# Patient Record
Sex: Female | Born: 1940
Health system: Southern US, Community
[De-identification: ages and names within clinical notes are randomized; demographics above are authoritative.]

## PROBLEM LIST (undated history)

## (undated) DIAGNOSIS — N183 Chronic kidney disease, stage 3 (moderate): Secondary | ICD-10-CM

## (undated) DIAGNOSIS — E119 Type 2 diabetes mellitus without complications: Secondary | ICD-10-CM

## (undated) DIAGNOSIS — C50919 Malignant neoplasm of unspecified site of unspecified female breast: Secondary | ICD-10-CM

## (undated) DIAGNOSIS — C801 Malignant (primary) neoplasm, unspecified: Secondary | ICD-10-CM

## (undated) DIAGNOSIS — R011 Cardiac murmur, unspecified: Secondary | ICD-10-CM

## (undated) DIAGNOSIS — Z973 Presence of spectacles and contact lenses: Secondary | ICD-10-CM

## (undated) DIAGNOSIS — R0600 Dyspnea, unspecified: Secondary | ICD-10-CM

## (undated) DIAGNOSIS — I4891 Unspecified atrial fibrillation: Secondary | ICD-10-CM

## (undated) DIAGNOSIS — E785 Hyperlipidemia, unspecified: Secondary | ICD-10-CM

## (undated) DIAGNOSIS — K219 Gastro-esophageal reflux disease without esophagitis: Secondary | ICD-10-CM

## (undated) DIAGNOSIS — I10 Essential (primary) hypertension: Secondary | ICD-10-CM

## (undated) HISTORY — DX: Essential (primary) hypertension: I10

## (undated) HISTORY — DX: Hyperlipidemia, unspecified: E78.5

## (undated) HISTORY — PX: UPPER GI ENDOSCOPY: SHX6162

## (undated) HISTORY — DX: Type 2 diabetes mellitus without complications: E11.9

## (undated) HISTORY — DX: Gastro-esophageal reflux disease without esophagitis: K21.9

## (undated) HISTORY — PX: ABDOMINAL HYSTERECTOMY: SHX81

## (undated) HISTORY — DX: Malignant (primary) neoplasm, unspecified: C80.1

## (undated) HISTORY — PX: COLONOSCOPY: SHX174

## (undated) HISTORY — DX: Malignant neoplasm of unspecified site of unspecified female breast: C50.919

## (undated) HISTORY — DX: Chronic kidney disease, stage 3 (moderate): N18.3

---

## 1994-06-08 ENCOUNTER — Encounter (INDEPENDENT_AMBULATORY_CARE_PROVIDER_SITE_OTHER): Payer: Self-pay | Admitting: *Deleted

## 1994-06-08 LAB — CONVERTED CEMR LAB

## 1997-09-05 ENCOUNTER — Encounter: Admission: RE | Admit: 1997-09-05 | Discharge: 1997-09-05 | Payer: Self-pay | Admitting: Family Medicine

## 1997-12-17 ENCOUNTER — Encounter: Admission: RE | Admit: 1997-12-17 | Discharge: 1997-12-17 | Payer: Self-pay | Admitting: Family Medicine

## 1998-10-09 ENCOUNTER — Encounter: Admission: RE | Admit: 1998-10-09 | Discharge: 1998-10-09 | Payer: Self-pay | Admitting: Family Medicine

## 1998-10-23 ENCOUNTER — Encounter: Admission: RE | Admit: 1998-10-23 | Discharge: 1998-10-23 | Payer: Self-pay | Admitting: Family Medicine

## 1998-10-23 ENCOUNTER — Ambulatory Visit (HOSPITAL_COMMUNITY): Admission: RE | Admit: 1998-10-23 | Discharge: 1998-10-23 | Payer: Self-pay | Admitting: Family Medicine

## 1998-11-23 ENCOUNTER — Encounter: Admission: RE | Admit: 1998-11-23 | Discharge: 1999-02-21 | Payer: Self-pay

## 1998-12-01 ENCOUNTER — Encounter: Admission: RE | Admit: 1998-12-01 | Discharge: 1998-12-01 | Payer: Self-pay | Admitting: Sports Medicine

## 1998-12-03 ENCOUNTER — Encounter: Admission: RE | Admit: 1998-12-03 | Discharge: 1998-12-03 | Payer: Self-pay | Admitting: Sports Medicine

## 1998-12-03 ENCOUNTER — Encounter: Payer: Self-pay | Admitting: Sports Medicine

## 1999-07-13 ENCOUNTER — Encounter: Admission: RE | Admit: 1999-07-13 | Discharge: 1999-07-13 | Payer: Self-pay | Admitting: Family Medicine

## 1999-07-19 ENCOUNTER — Encounter: Admission: RE | Admit: 1999-07-19 | Discharge: 1999-07-19 | Payer: Self-pay | Admitting: Family Medicine

## 2000-02-08 HISTORY — PX: BACK SURGERY: SHX140

## 2000-02-14 ENCOUNTER — Encounter: Admission: RE | Admit: 2000-02-14 | Discharge: 2000-02-14 | Payer: Self-pay | Admitting: Family Medicine

## 2000-07-26 ENCOUNTER — Encounter: Admission: RE | Admit: 2000-07-26 | Discharge: 2000-07-26 | Payer: Self-pay | Admitting: Family Medicine

## 2000-08-01 ENCOUNTER — Encounter: Admission: RE | Admit: 2000-08-01 | Discharge: 2000-08-01 | Payer: Self-pay | Admitting: Family Medicine

## 2000-08-03 ENCOUNTER — Encounter: Admission: RE | Admit: 2000-08-03 | Discharge: 2000-08-03 | Payer: Self-pay | Admitting: Family Medicine

## 2000-08-04 ENCOUNTER — Encounter: Admission: RE | Admit: 2000-08-04 | Discharge: 2000-08-04 | Payer: Self-pay | Admitting: *Deleted

## 2000-08-08 ENCOUNTER — Encounter: Admission: RE | Admit: 2000-08-08 | Discharge: 2000-08-08 | Payer: Self-pay | Admitting: *Deleted

## 2000-08-08 ENCOUNTER — Encounter: Payer: Self-pay | Admitting: *Deleted

## 2000-08-14 ENCOUNTER — Encounter: Admission: RE | Admit: 2000-08-14 | Discharge: 2000-08-14 | Payer: Self-pay | Admitting: Family Medicine

## 2000-09-12 ENCOUNTER — Encounter: Payer: Self-pay | Admitting: Orthopedic Surgery

## 2000-09-12 ENCOUNTER — Encounter: Admission: RE | Admit: 2000-09-12 | Discharge: 2000-09-12 | Payer: Self-pay | Admitting: Family Medicine

## 2000-09-15 ENCOUNTER — Encounter: Admission: RE | Admit: 2000-09-15 | Discharge: 2000-09-15 | Payer: Self-pay | Admitting: Family Medicine

## 2000-09-15 ENCOUNTER — Encounter: Payer: Self-pay | Admitting: Orthopedic Surgery

## 2000-09-15 ENCOUNTER — Encounter (INDEPENDENT_AMBULATORY_CARE_PROVIDER_SITE_OTHER): Payer: Self-pay | Admitting: *Deleted

## 2000-09-16 ENCOUNTER — Inpatient Hospital Stay (HOSPITAL_COMMUNITY): Admission: RE | Admit: 2000-09-16 | Discharge: 2000-09-17 | Payer: Self-pay | Admitting: Orthopedic Surgery

## 2000-09-16 ENCOUNTER — Encounter: Payer: Self-pay | Admitting: Orthopedic Surgery

## 2000-10-11 ENCOUNTER — Encounter: Admission: RE | Admit: 2000-10-11 | Discharge: 2000-10-11 | Payer: Self-pay | Admitting: Family Medicine

## 2000-10-18 ENCOUNTER — Encounter: Admission: RE | Admit: 2000-10-18 | Discharge: 2000-10-18 | Payer: Self-pay | Admitting: Family Medicine

## 2000-10-25 ENCOUNTER — Encounter: Admission: RE | Admit: 2000-10-25 | Discharge: 2000-10-25 | Payer: Self-pay | Admitting: Family Medicine

## 2000-11-08 ENCOUNTER — Encounter: Admission: RE | Admit: 2000-11-08 | Discharge: 2000-11-08 | Payer: Self-pay | Admitting: Family Medicine

## 2001-02-07 HISTORY — PX: CARDIAC CATHETERIZATION: SHX172

## 2001-02-20 ENCOUNTER — Encounter: Admission: RE | Admit: 2001-02-20 | Discharge: 2001-02-20 | Payer: Self-pay | Admitting: Family Medicine

## 2001-03-22 ENCOUNTER — Encounter: Admission: RE | Admit: 2001-03-22 | Discharge: 2001-03-22 | Payer: Self-pay | Admitting: Family Medicine

## 2001-04-03 ENCOUNTER — Emergency Department (HOSPITAL_COMMUNITY): Admission: EM | Admit: 2001-04-03 | Discharge: 2001-04-03 | Payer: Self-pay | Admitting: Emergency Medicine

## 2001-04-03 ENCOUNTER — Encounter: Payer: Self-pay | Admitting: Emergency Medicine

## 2001-04-24 ENCOUNTER — Encounter: Admission: RE | Admit: 2001-04-24 | Discharge: 2001-04-24 | Payer: Self-pay | Admitting: Family Medicine

## 2001-08-16 ENCOUNTER — Ambulatory Visit (HOSPITAL_COMMUNITY): Admission: RE | Admit: 2001-08-16 | Discharge: 2001-08-17 | Payer: Self-pay | Admitting: Cardiovascular Disease

## 2001-09-18 ENCOUNTER — Encounter: Admission: RE | Admit: 2001-09-18 | Discharge: 2001-09-18 | Payer: Self-pay | Admitting: Gastroenterology

## 2001-09-18 ENCOUNTER — Encounter: Payer: Self-pay | Admitting: Gastroenterology

## 2001-10-24 ENCOUNTER — Ambulatory Visit (HOSPITAL_COMMUNITY): Admission: RE | Admit: 2001-10-24 | Discharge: 2001-10-24 | Payer: Self-pay | Admitting: Gastroenterology

## 2001-10-24 ENCOUNTER — Encounter (INDEPENDENT_AMBULATORY_CARE_PROVIDER_SITE_OTHER): Payer: Self-pay | Admitting: *Deleted

## 2001-11-13 ENCOUNTER — Ambulatory Visit (HOSPITAL_COMMUNITY): Admission: RE | Admit: 2001-11-13 | Discharge: 2001-11-13 | Payer: Self-pay | Admitting: Gastroenterology

## 2001-11-13 ENCOUNTER — Encounter: Payer: Self-pay | Admitting: Gastroenterology

## 2002-05-10 ENCOUNTER — Encounter: Admission: RE | Admit: 2002-05-10 | Discharge: 2002-05-10 | Payer: Self-pay | Admitting: Internal Medicine

## 2002-05-10 ENCOUNTER — Encounter: Payer: Self-pay | Admitting: Internal Medicine

## 2003-05-27 ENCOUNTER — Encounter: Admission: RE | Admit: 2003-05-27 | Discharge: 2003-05-27 | Payer: Self-pay | Admitting: Internal Medicine

## 2003-10-24 ENCOUNTER — Emergency Department (HOSPITAL_COMMUNITY): Admission: EM | Admit: 2003-10-24 | Discharge: 2003-10-24 | Payer: Self-pay | Admitting: Emergency Medicine

## 2004-04-07 ENCOUNTER — Encounter: Admission: RE | Admit: 2004-04-07 | Discharge: 2004-04-07 | Payer: Self-pay | Admitting: Gastroenterology

## 2004-06-23 ENCOUNTER — Ambulatory Visit (HOSPITAL_COMMUNITY): Admission: RE | Admit: 2004-06-23 | Discharge: 2004-06-23 | Payer: Self-pay | Admitting: Gastroenterology

## 2004-06-23 ENCOUNTER — Encounter (INDEPENDENT_AMBULATORY_CARE_PROVIDER_SITE_OTHER): Payer: Self-pay | Admitting: Specialist

## 2004-06-25 ENCOUNTER — Inpatient Hospital Stay (HOSPITAL_COMMUNITY): Admission: AD | Admit: 2004-06-25 | Discharge: 2004-06-27 | Payer: Self-pay | Admitting: Cardiology

## 2005-02-12 ENCOUNTER — Emergency Department (HOSPITAL_COMMUNITY): Admission: EM | Admit: 2005-02-12 | Discharge: 2005-02-12 | Payer: Self-pay

## 2005-02-21 ENCOUNTER — Emergency Department (HOSPITAL_COMMUNITY): Admission: EM | Admit: 2005-02-21 | Discharge: 2005-02-21 | Payer: Self-pay | Admitting: Family Medicine

## 2005-04-07 ENCOUNTER — Encounter: Admission: RE | Admit: 2005-04-07 | Discharge: 2005-04-07 | Payer: Self-pay

## 2005-04-28 ENCOUNTER — Emergency Department (HOSPITAL_COMMUNITY): Admission: EM | Admit: 2005-04-28 | Discharge: 2005-04-28 | Payer: Self-pay | Admitting: Emergency Medicine

## 2005-05-04 ENCOUNTER — Emergency Department (HOSPITAL_COMMUNITY): Admission: EM | Admit: 2005-05-04 | Discharge: 2005-05-05 | Payer: Self-pay | Admitting: Emergency Medicine

## 2005-06-01 ENCOUNTER — Emergency Department (HOSPITAL_COMMUNITY): Admission: EM | Admit: 2005-06-01 | Discharge: 2005-06-01 | Payer: Self-pay | Admitting: Emergency Medicine

## 2006-04-07 ENCOUNTER — Encounter (INDEPENDENT_AMBULATORY_CARE_PROVIDER_SITE_OTHER): Payer: Self-pay | Admitting: *Deleted

## 2006-11-17 ENCOUNTER — Emergency Department (HOSPITAL_COMMUNITY): Admission: EM | Admit: 2006-11-17 | Discharge: 2006-11-17 | Payer: Self-pay | Admitting: Family Medicine

## 2008-05-21 ENCOUNTER — Emergency Department (HOSPITAL_COMMUNITY): Admission: EM | Admit: 2008-05-21 | Discharge: 2008-05-21 | Payer: Self-pay | Admitting: Emergency Medicine

## 2009-04-24 DIAGNOSIS — E785 Hyperlipidemia, unspecified: Secondary | ICD-10-CM

## 2009-04-24 DIAGNOSIS — I10 Essential (primary) hypertension: Secondary | ICD-10-CM

## 2009-12-25 ENCOUNTER — Emergency Department (HOSPITAL_COMMUNITY): Admission: EM | Admit: 2009-12-25 | Discharge: 2009-12-25 | Payer: Self-pay | Admitting: Emergency Medicine

## 2010-02-09 ENCOUNTER — Emergency Department (HOSPITAL_COMMUNITY)
Admission: EM | Admit: 2010-02-09 | Discharge: 2010-02-09 | Payer: Self-pay | Source: Home / Self Care | Admitting: Emergency Medicine

## 2010-04-30 ENCOUNTER — Other Ambulatory Visit: Payer: Self-pay | Admitting: Internal Medicine

## 2010-04-30 DIAGNOSIS — Z1231 Encounter for screening mammogram for malignant neoplasm of breast: Secondary | ICD-10-CM

## 2010-05-07 ENCOUNTER — Ambulatory Visit
Admission: RE | Admit: 2010-05-07 | Discharge: 2010-05-07 | Disposition: A | Discharge status: Home / Self Care | Source: Ambulatory Visit | Attending: Internal Medicine | Admitting: Internal Medicine

## 2010-05-07 DIAGNOSIS — Z1231 Encounter for screening mammogram for malignant neoplasm of breast: Secondary | ICD-10-CM

## 2010-05-19 LAB — POCT I-STAT, CHEM 8
BUN: 20 mg/dL (ref 6–23)
Calcium, Ion: 1.19 mmol/L (ref 1.12–1.32)
HCT: 34 % — ABNORMAL LOW (ref 36.0–46.0)
Hemoglobin: 11.6 g/dL — ABNORMAL LOW (ref 12.0–15.0)
Sodium: 139 mEq/L (ref 135–145)
TCO2: 28 mmol/L (ref 0–100)

## 2010-05-19 LAB — POCT CARDIAC MARKERS
Myoglobin, poc: 73.1 ng/mL (ref 12–200)
Troponin i, poc: <0.05 ng/mL (ref 0.00–0.09)

## 2010-05-19 LAB — URINALYSIS, ROUTINE W REFLEX MICROSCOPIC
Glucose, UA: NEGATIVE mg/dL
Hgb urine dipstick: NEGATIVE
Specific Gravity, Urine: 1.01 (ref 1.005–1.030)
pH: 7 (ref 5.0–8.0)

## 2010-05-19 LAB — PROTIME-INR
INR: 1.1 (ref 0.00–1.49)
Prothrombin Time: 14.2 seconds (ref 11.6–15.2)

## 2010-05-19 LAB — URINE MICROSCOPIC-ADD ON

## 2010-06-25 NOTE — Op Note (Signed)
NAME:  Carrie Key, Carrie Key NO.:  [id number]   MEDICAL RECORD NO.:  [id number]          PATIENT TYPE:  AMB   LOCATION:  ENDO                         FACILITY:  [facility]   PHYSICIAN:  Anselmo Rod, M.D.  DATE OF BIRTH:  10/05/1940   DATE OF PROCEDURE:  06/23/2004  DATE OF DISCHARGE:                                 OPERATIVE REPORT   PROCEDURE PERFORMED:  Colonoscopy with cold biopsies times two.   ENDOSCOPIST:  Charna Elizabeth, M.D.   INSTRUMENT USED:  Olympus video colonoscope.   INDICATIONS FOR PROCEDURE:  A 70 year old African-American female with a  history of tubulovillous adenomas removed in the past.  Undergoing a repeat  colonoscopy to rule out polyps, masses, etc.   PREPROCEDURE PREPARATION:  Informed consent was procured from the patient.  The patient was fasted for eight hours prior to the procedure and prepped  with a bottle of magnesium citrate and a gallon of GoLYTELY the night prior  to the procedure.  The risks and benefits of the procedure including a 10%  miss rate for colon polyps or cancers was discussed with the patient as  well.   PREPROCEDURE PHYSICAL:  The patient had stable vital signs except for  significantly elevated blood pressure of 240/190, 250/180 on several repeat  recordings.  The patient was alert and oriented times three, in no acute  distress.  Neck supple.  Chest clear to auscultation.  S1 and S2 regular.  Abdomen soft with normal bowel sounds.   DESCRIPTION OF PROCEDURE:  The patient was placed in left lateral decubitus  position and sedated with 25 mg of Demerol in addition to the medications  she received for the esophagogastroduodenoscopy.  Once the patient was  adequately sedated and maintained on low flow oxygen and continuous cardiac  monitoring, the Olympus video colonoscope was advanced from the rectum to  the cecum.  The ileocecal valve was clearly visualized.  Two small sessile  polyps were biopsied from the cecum.  The  terminal ileum was not visualized.  There was some residual stool in the colon.  Multiple washes were done.  Retroflexion in the rectum revealed no abnormalities.  The patient tolerated  the procedure well without complication.   IMPRESSION:  Two small sessile polyps biopsied from proximal right colon.   RECOMMENDATIONS:  1. Await pathology results.  2. Avoid all nonsteroidals.  3. Outpatient followup in the next two weeks for further recommendations.        JNM/MEDQ  D:  06/23/2004  T:  06/23/2004  Job:  [id number]   cc:   Merlene Laughter. Renae Gloss, M.D.  [address]  [address]  [place]  [place] [address]  Fax: [phone]

## 2010-06-25 NOTE — Op Note (Signed)
NAME:  Carrie Key, Carrie Key NO.:  [id number]   MEDICAL RECORD NO.:  [id number]                   PATIENT TYPE:  AMB   LOCATION:  ENDO                                 FACILITY:  [facility]   PHYSICIAN:  Charna Elizabeth, M.D.                   DATE OF BIRTH:  07-May-1940   DATE OF PROCEDURE:  10/24/2001  DATE OF DISCHARGE:                                 OPERATIVE REPORT   PROCEDURE:  Esophagogastroduodenoscopy with biopsies.   ENDOSCOPIST:  Charna Elizabeth, M.D.   INSTRUMENT USED:  Olympus video panendoscope.   INDICATION FOR PROCEDURE:  A 70 year old African-American female with a  history of dysphagia, worse for liquids.  The patient's barium swallow did  not show a frank stricture.  The patient also has guaiac-positive stools.  Rule out esophagitis, peptic ulcer disease, etc.   PREPROCEDURE PREPARATION:  Informed consent was procured from the patient.  The patient was fasted for eight hours prior to the procedure.   PREPROCEDURE PHYSICAL:  VITAL SIGNS:  The patient had stable vital signs.  NECK:  Supple.  CHEST:  Clear to auscultation.  S1, S2 regular.  ABDOMEN:  Soft with normal bowel sounds.   DESCRIPTION OF PROCEDURE:  The patient was placed in the left lateral  decubitus position and sedated with 70 mg of Demerol and 7 mg of Versed  intravenously.  Once the patient was adequately sedate and maintained on low-  flow oxygen and continuous cardiac monitoring, the Olympus video  panendoscope was advanced through the mouthpiece, over the tongue, into the  esophagus under direct vision.  The proximal esophagus appeared normal.  There was a wide-open Schatzki's ring at the Z-line.  There was evidence of  Barrett's mucosa at the Z-line with patches of pinkish mucosa extending over  the gastroesophageal junction and the Schatzki's ring.  This was biopsied to  rule out dysphagia.  A small hiatal hernia was seen on high retroflexion.  The rest of the gastric mucosa  and proximal small bowel appeared normal.   IMPRESSION:  1. Barrett's-like changes in the distal esophagus with wide-open Schatzki's     ring, biopsies done, results pending.  2. Small hiatal hernia.  3. Normal-appearing stomach and proximal small bowel.   RECOMMENDATIONS:  1. Await pathology results.  2.     Proceed with a colonoscopy at this time.  3. Esophageal manometry to further evaluate the patient's dysphagia for     liquids.  4. Further recommendations to be made after colonoscopy.                                               Charna Elizabeth, M.D.    JM/MEDQ  D:  10/24/2001  T:  10/26/2001  Job:  [id number]   cc:  Merlene Laughter. Renae Gloss, M.D.

## 2010-06-25 NOTE — Discharge Summary (Signed)
[place]  Patient:    Carrie Key, Carrie Key Visit Number: [id number] MRN: [id number]          Service Type: CAT Location: [id number] Attending Physician:  Berry, Jonathan [place] Dictated by:   Raymon Mutton, P.A. Admit Date:  08/16/2001 Discharge Date: 08/17/2001   CC:         Lenise Herald, M.D.   Discharge Summary  DISCHARGE DIAGNOSES: 1. Chest pain admitted for elective coronary angiography. 2. Hypertension. 3. Hyperlipidemia.  HISTORY OF PRESENT ILLNESS:  The patient is a 70 year old African-American woman presenting to the office on August 09, 2001 with complaint of worrisome chest pain and shortness of breath.  Knowing her family history of coronary artery disease, the exact are such as hypertension and hyperlipidemia, she was scheduled for elective coronary angiography on August 16, 2001.  HOSPITAL COURSE:  The patient underwent coronary angiography on August 16, 2001 by Dr. Allyson Sabal.  The procedure revealed normal left ventricular function and normal coronaries as well as normal abdominal aorta and renal arteries.  Her chest pain was thought to be related to gastroesophageal reflux disease.  The patient was started on Nexium as outpatient and recommended to continue that.  She was stable after cath.  Her groin site was without any bleeding or oozing and swelling but she developed a blood pressure rise that was quite significant.  Her systolic reached 130 and diastolic 107 so the patient was transferred to the telemetry unit to stay overnight for better blood pressure control.  She was started on pindolol 10 mg t.i.d.  Next morning her blood pressure improved and she was stable from cardiovascular standpoint free of chest pain or shortness of breath and discharged home in good condition.  DISCHARGE MEDICATIONS: 1. Pindolol 10 mg p.o. t.i.d. 2. Diovan 160 mg p.o. daily. 3. Nexium 40 mg p.o. daily. 4. Norvasc 10 mg p.o. daily. 5.  Lotensin 40 mg p.o. daily. 6. Clonidine 0.3 mg b.i.d. p.o. 7. Zocor 40 mg p.o. daily. 8. Aspirin 81 mg daily.  DISCHARGE ACTIVITY:  No driving.  No heavy lifting.  No strenuous physical activity including sexual activity for 72 hours after the cath.  DISCHARGE DIET:  Low salt, low fat, cardiac diet.  WOUND CARE:  She was instructed to report any pain, swelling, redness, bleeding from the groin puncture site to our office.  FOLLOWUP:  Appointment scheduled with Dr. Jenne Campus on August 28, 2001 at 3:45 p.m. Dictated by:   Raymon Mutton, P.A. Attending Physician:  Berry, Jonathan [place] DD:  08/17/01 TD:  08/20/01 Job: [id number]

## 2010-06-25 NOTE — Discharge Summary (Signed)
Carrie Key, Carrie Key NO.:  [id number]   MEDICAL RECORD NO.:  [id number]          PATIENT TYPE:  INP   LOCATION:  3708                         FACILITY:  [facility]   PHYSICIAN:  Raymon Mutton, P.A. DATE OF BIRTH:  October 19, 1940   DATE OF ADMISSION:  06/25/2004  DATE OF DISCHARGE:  06/27/2004                                 DISCHARGE SUMMARY   DISCHARGE DIAGNOSES:  1.  Hypertensive crisis - resolved with compliance of medications.  2.  Gastroesophageal reflux disease.  3.  Hyperlipidemia.  4.  Chest pain - resolved, status post catheterization in 2003 with normal      coronaries.  5.  Status post recent colonoscopy with resection of polyps.   HISTORY OF PRESENT ILLNESS AND HOSPITAL COURSE:  This is a 70 year old  African-American female who presented to the office with elevated blood  pressure.  In the office, her blood pressure was 240/120, and second reading  250/120.  She was given Sular, Toprol-XL 50 mg, but her blood pressure was  not responding well.  In an hour and a half, it was rechecked but still  remained elevated, and the patient was transported to [facility]  for admission.  Of note, she had a colonoscopy on Jun 23, 2004, and we were  called to see the patient in Endo when her blood pressure also was elevated.  At that time, the patient was given Caduet and clonidine and nitroglycerin  sublingual, and in a while her blood pressure reduced to 154/94.   At this time, the patient is being admitted to [facility] for blood  pressure management and complaints of blurry vision, chest tightness, and  pain in the left arm.  The patient said that she does not remember her  medications and apparently was not taking her clonidine for a while.   Her laboratories revealed a normal cardiac panel x3.  TSH was normal at  0.461.  Magnesium was also normal at 1.7.  BMP showed 140 sodium, potassium  3.3, and it was replenished.  BUN was 10, creatinine 0.9.  A  recheck of BMP  after the patient was given potassium showed a serum potassium level of 4.6.  Lipid profile revealed cholesterol 318, triglycerides 19, HDL 49, and LDL  265.   CBC revealed hemoglobin 13.6, hematocrit 40.1, platelet count 190, and white  blood cell count 7.4.   We adjusted her medications.  The next morning, her blood pressure was  improved.  It showed 151/91; and heart rate was 75.  Her chest pain  resolved, and her overall condition improved.  Vision cleared.   The day of discharge, Dr. Clarene Duke saw the patient.  She was very symptomatic  and had swallowing difficulties, and she is supposed to follow up for that  with Dr. Loreta Ave.  She was discharged home with improved blood pressure,  129/66, with medication compliance, and case manager was contacted to help  the patient with medications as much as possible.   DISCHARGE MEDICATIONS:  1.  Toprol-XL 100 mg daily.  2.  Lotensin 40 mg daily.  3.  Caduet 10/80 mg daily.  4.  Micardis 80/25 mg daily.  5.  Clonidine 0.1 mg b.i.d.   DISCHARGE ACTIVITY:  Without restrictions, as tolerated by patient.   DISCHARGE DIET:  Low salt, low cholesterol diet.   DISCHARGE FOLLOWUP:  Our office will contact the patient to schedule her for  a blood pressure checkup in two to three days in the office, and then a  follow-up appointment with Dr. Jenne Campus in two to three weeks.      MK/MEDQ  D:  06/27/2004  T:  06/27/2004  Job:  [id number]   cc:   Darlin Priestly, MD  [address]  [place]  [place] [address]  Fax: [phone]

## 2010-06-25 NOTE — Op Note (Signed)
NAME:  Carrie Key, Carrie Key NO.:  [id number]   MEDICAL RECORD NO.:  [id number]          PATIENT TYPE:  AMB   LOCATION:  ENDO                         FACILITY:  [facility]   PHYSICIAN:  Anselmo Rod, M.D.  DATE OF BIRTH:  Jun 28, 1940   DATE OF PROCEDURE:  06/23/2004  DATE OF DISCHARGE:                                 OPERATIVE REPORT   PROCEDURE PERFORMED:  Esophagogastroduodenoscopy with antral and esophageal  biopsies.   ENDOSCOPIST:  Anselmo Rod, M.D.   INSTRUMENT USED:  Olympus video panendoscope.   INDICATIONS FOR PROCEDURE:  A 70 year old [place] American female with a  history of Zenker's diverticulum, dysphagia and Barrett's esophagus,  undergoing a repeat esophagogastroduodenoscopy to further evaluate her  dysphagia.   PREPROCEDURE PREPARATION:  Informed consent was procured from the patient.  The patient was fasted for eight hours prior to the procedure.   PREPROCEDURE PHYSICAL:  VITAL SIGNS: The patient had stable vital signs  except for elevated blood pressure of 250/190.  On repeat evaluations, Dr.  Barbee Shropshire was contacted in the absence of Dr. Renae Gloss who was out of the  office today and the advised that the patient resume all of her  antihypertensives that have been prescribed for her.  NECK: Supple.  CHEST: Clear to auscultation.  S1 and S2 regular.  ABDOMEN: Soft with normal bowel sounds.   DESCRIPTION OF PROCEDURE:  The patient was placed in the left lateral  decubitus position, sedated with 75 mg of Demerol and 10 mg of Versed in  slow incremental doses.  Once the patient was adequately sedated and  maintained on low flow oxygen and continuous cardiac monitoring, the Olympus  video panendoscope was advanced through the mouth piece, over the tongue,  into the esophagus and under direct vision.  The proximal esophagus appeared  normal.  There were Barrett's-like changes in the distal esophagus, biopsied  for pathology.  The patient had a wide  open Schatzki's ring.  No Zenker's  was identified in the antegrade view or on withdrawal of the scope.  In  advancing the scope into the stomach, there were nodular changes throughout  the gastric mucosa that were biopsied for pathology.  There was a distinct  difference in the erythema with changes of the gastric mucosa between the  mid body and the antrum and multiple biopsies were done to rule out the  presence of Helicobacter pylori.  The proximal small bowel appeared normal.  There was no outlet obstruction.  Retroflexion of the high cardia revealed  no evidence of a hiatal hernia and the patient tolerated the procedure well  without complications.  No dilatation was done as the esophagus seemed  widely patent.   IMPRESSION:  1. Wide open Schatzki's ring in the distal esophagus.  2. Barrett's-like changes in the distal esophagus, biopsies done to rule      out dysplasia.  3. Nodular changes throughout the gastric mucosa, biopsies done to rule      out Helicobacter pylori.  4. Normal proximal small bowel.     RECOMMENDATIONS:  1. Await pathology results.  2. Avoid  nonsteroidal's including aspirin for now.  3. Proceed with a colonoscopy.  4. Outpatient follow up in the next two weeks or earlier if need be.        JNM/MEDQ  D:  06/23/2004  T:  06/23/2004  Job:  [id number]   cc:   Merlene Laughter. Renae Gloss, M.D.  [address]  [address]  [place]  [place] [address]  Fax: [phone]

## 2010-06-25 NOTE — Op Note (Signed)
[place]. [facility]  Patient:    Carrie Key, Carrie Key                      MRN: [id number] Proc. Date: 09/15/00 Adm. Date:  [id number] Attending:  Marlowe Kays Page                           Operative Report  PREOPERATIVE DIAGNOSIS:  Central disk herniation, L4-5.  POSTOPERATIVE DIAGNOSIS:  Central disk herniation, L4-5, with two large extruded fragments on the left.  OPERATION:  Bilateral microdiskectomy L4-L5 with excision of herniated nucleus pulposus and two free fragments, left.  SURGEON:  Illene Labrador. [place], M.D.  ASSISTANT:  Georges Lynch. Darrelyn Hillock, M.D.  ANESTHESIA:  General.  INDICATIONS:  She had sudden onset of right leg pain on July 26, 2000.  The pain subsequently shifted to her left leg.  An MRI on August 08, 2000, demonstrated a left central disk herniation to the L4-L5 biased mainly to the right.  She has failed to respond to nonsurgical treatment, in fact, is in progressive severe pain in the left lower extremity now so she is here today for the above mentioned surgery.  See operative description below for additional details.  DESCRIPTION OF PROCEDURE:  Prophylactic antibiotics, satisfactory general anesthesia, knee-chest position on the [place] frame.  The back was prepped with DuraPrep and with three spinal needles a lateral x-ray we were able to localize the L4-L5 interspace.  Then completing draping of the back in a sterile field, Ioban employed, vertical incision at L4-5.  The soft tissue was dissected off the two spinous processes at this level which had been tagged with Kocher clamps and took a second lateral x-ray confirming that they were on L4 and L5 with the L4 disk midway between.  I then continued dissecting soft tissue off the lamina of L4 and L5 and placed a self-retaining McCullough retractor.  Working bilaterally we removed a portion of the inferior lamina of L4 and with a small curet worked beneath the superior lamina of L5  so that we could introduce the 2 mm Kerrison followed by a 3 mm.  She had a very ligamentum flavum particularly on the right side.  After we had adequate working room I brought in the microscope and completed the decompression bilaterally removing additional bone and ligament of flavum and doing wide foraminotomies.  I initially started looking for the disk herniation on the right side but she had a good bit of inflammatory reaction, and the anatomy is not well-defined and because of this I elected to move to the left side. There, the anatomy seemed to be much more well-defined.  The L5 nerve root was identified and retracted medially.  Beneath it was a large disk protrusion which we dissected off the underneath surface of the nerve root and we then milked it out with nerve hook and this fragment measured 3.5 cm x 2 cm wide. We then with pituitary rongeurs were able to remove another extruded fragment which 2 x 1 cm.  This really freed up the the nerve root and the dura.  We then cleared the L4-5 interspace of all disk material on the left side with a combination of straight and angled pituitaries and Epstein curet.  We then moved to the right side and the membranous area that we were concerned about and were unable to definitely identify as either the nerve or disk turned out  to be inflammatory membrane over the L4-L5 interspace which we identified with a spinal needle and then opened with a 15 knife blade.  There was very little disk material remaining on the right side.  We removed as much as we could of the inflammatory tissue with pituitary rongeur and then checked to be sure that the neural foramen was widely patent.  This site was then irrigated with saline and packed with Gelfoam.  There was no bleeding of any consequence there.  When we went back to the left side and reentered the interspace with no additional disk material being removed.  I then checked to be sure that the L4-L5  nerve root on that side was also well decompressed and I checked beneath the dura which was freely movable.  The L3-4 foramen was also checked with a hockey stick and was found to be patent.  I then irrigated and packed this side with Gelfoam as well.  Self-retaining retractors were removed.  There was no unusual bleeding.  She was given 30 mg of Toradol IV.  The fascia was closed with interrupted #1 Vicryl as well as the deep subcutaneous tissue, superficial and subcutaneous tissue with the combination of 1 and 2-0 Vicryl and soft tissues were then infiltrated with 0.5% plain Marcaine.  The skin was then closed with staples.  Betadine, Adaptic and dry sterile dressing were applied.  She was placed on her hospital bed and taken to the recovery room in satisfactory condition with no known complications. DD:  09/15/00 TD:  09/17/00 Job: [id number]

## 2010-06-25 NOTE — Op Note (Signed)
NAME:  Carrie Key, DUDEK NO.:  [id number]   MEDICAL RECORD NO.:  [id number]                   PATIENT TYPE:  AMB   LOCATION:  ENDO                                 FACILITY:  [facility]   PHYSICIAN:  Charna Elizabeth, M.D.                   DATE OF BIRTH:  Jul 02, 1940   DATE OF PROCEDURE:  10/24/2001  DATE OF DISCHARGE:                                 OPERATIVE REPORT   PROCEDURE:  Colonoscopy with snare polypectomy x1.   ENDOSCOPIST:  Charna Elizabeth, M.D.   INSTRUMENT USED:  Olympus video colonoscope.   INDICATION FOR PROCEDURE:  A 70 year old African-American female with guaiac-  positive stool.  Rule out colonic polyps, masses, hemorrhoids, etc.   PREPROCEDURE PREPARATION:  Informed consent was procured from the patient.  The patient was fasted for eight hours prior to the procedure and prepped  with a bottle of magnesium citrate and a gallon of NuLytely the night prior  to the procedure.   PREPROCEDURE PHYSICAL:  VITAL SIGNS:  The patient had stable vital signs  except for significantly elevated blood pressure of 258/109.  NECK:  Supple.  CHEST:  Clear to auscultation.  S1, S2 regular.  ABDOMEN:  Soft with normal bowel sounds.   DESCRIPTION OF PROCEDURE:  The patient was placed in the left lateral  decubitus position and sedated with an additional 30 mg of Demerol and 3 mg  of Versed intravenously.  Once the patient was adequately sedate and  maintained on low-flow oxygen and continuous cardiac monitoring, the Olympus  video colonoscope was advanced from the rectum to the cecum with difficulty.  There was scattered diverticulosis throughout the colon with large amount of  residual stool in the colon.  Multiple efforts were made to wash the stool  and reach the cecum.  The appendiceal orifice was visualized, but the rest  of the colonic mucosa could not be visualized because of an inadequate prep.  A pedunculated polyp was snared from 5 cm.  Small lesions  could have been  missed.   IMPRESSION:  1. A pedunculated polyp snared from 5 cm.  2. Scattered diverticular disease.  3. Large amount of residual stool in the colon, visualization inadequate.     Small lesions could have been missed.   RECOMMENDATIONS:  1. Await pathology results.  2. Avoid all nonsteroidals, including aspirin.  3.     Repeat colonoscopy in the next six months to a year with better prep.  4. Follow up with Dr. Renae Gloss in a.m. for re-evaluation of hypertension.  5. Outpatient follow-up with me in the next couple of weeks.                                               Charna Elizabeth, M.D.  JM/MEDQ  D:  10/24/2001  T:  10/26/2001  Job:  [id number]   cc:   Merlene Laughter. Renae Gloss, M.D.

## 2010-06-25 NOTE — Cardiovascular Report (Signed)
[facility]  Patient:    Carrie Key, Carrie Key Visit Number: [id number] MRN: [id number]          Service Type: CAT Location: [id number] Attending Physician:  Berry, Jonathan [place]az[place] Dictated by:   Runell Gess, M.D. Proc. Date: 08/16/01 Admit Date:  08/16/2001 Discharge Date: 08/17/2001   CC:         Cardiac Catheterization Laboratory  [facility], [address], [place], [place] [address]             Merlene Laughter. Renae Gloss, M.D., [facility]   Cardiac Catheterization  INDICATIONS: The patient is a 70 year old, moderately overweight, widowed black female with her ______ who is currently disabled. She has risks factors including hypertension and hyperlipidemia. She has known GERD. She has had progressive increasing chest pain radiating to her upper extremities associated with shortness of breath, and a recent ECG performed in Dr. Remus Loffler office showed T wave inversions anteriorly, and a Q-S pattern in V1 and V2, which were new compared to her prior ECG. She has had a negative Cardiolite stress test three years ago. She presents now for cardiac catheterization and abdominal aortography to rule out vascular hypertension.  DESCRIPTION OF PROCEDURE: The patient was brought to the second floor cardiac catheterization lab in the postabsorptive state.  She was premedicated with p.o. Valium.  Her right groin was prepped and shaved in the usual sterile fashion.  Xylocaine 1% was used for local anesthesia.  A 6 French sheath was inserted into the right femoral artery using standard Seldinger technique.  A 6 French right and left Judkins catheter as well as a 6 French pigtail catheter were used for selective coronary angiography, left ventriculography, and distal abdominal aortography. Omnipaque dye was used for the entirety of the case. Retrograde, aortic, left ventricular, and pullback pressures  were recorded.  HEMODYNAMICS: 1. Aortic systolic pressure 277, diastolic pressure 129. 2. Left ventricular systolic pressure 269, diastolic pressure 40.  SELECTIVE CORONARY ANGIOGRAPHY: 1. Left main: Normal. 2. LAD: Normal. 3. Left circumflex: Normal. 4. Ramus intermedius branch: Normal. 5. Right coronary artery: Nondominant and normal.  LEFT VENTRICULOGRAPHY: The RAO left ventriculogram was performed using 20 cc of Omnipaque dye at 10 cc/sec. This was performed after the patient received sublingual nitroglycerin for her LVEDP. The overall LVEF was estimated at greater than 60% without focal wall motion abnormalities.  DISTAL ABDOMINAL AORTOGRAM: Distal abdominal aortogram was performed using 20 cc of Omnipaque dye at 20 cc/sec. x2. The renal arteries were widely patent. The infrarenal abdominal aorta and iliac bifurcation appear free of significant atherosclerotic changes.  IMPRESSION: The patient has normal coronary arteries and normal left ventricular function as well as normal renal arteries. I believe her chest pain is noncardiac and most likely related to gastroesophageal reflux disease. Her hypertension is essential and will needed to be treated aggressively with multiple antihypertensive medications. The patient was put on IV nitroglycerin drip and given IV labetalol to get her blood pressure down into a safe range in order to remove her sheath. Dr. Kellie Shropshire was notified of these results. The patient left the lab in stable condition. She is scheduled to see Dr. Renae Gloss in the office next Tuesday at 12:15 p.m. Dictated by:   Runell Gess, M.D. Attending Physician:  Berry, Jonathan Swaziland DD:  08/16/01 TD:  08/19/01 Job: [id number]

## 2011-07-13 ENCOUNTER — Other Ambulatory Visit: Payer: Self-pay | Admitting: Gastroenterology

## 2011-07-19 ENCOUNTER — Ambulatory Visit
Admission: RE | Admit: 2011-07-19 | Discharge: 2011-07-19 | Disposition: A | Discharge status: Home / Self Care | Payer: Medicare Other | Source: Ambulatory Visit | Attending: Gastroenterology | Admitting: Gastroenterology

## 2011-07-20 ENCOUNTER — Ambulatory Visit (HOSPITAL_COMMUNITY)
Admission: RE | Admit: 2011-07-20 | Discharge: 2011-07-20 | Disposition: A | Discharge status: Home / Self Care | Payer: Medicare Other | Source: Ambulatory Visit | Attending: Gastroenterology | Admitting: Gastroenterology

## 2011-07-20 DIAGNOSIS — R070 Pain in throat: Secondary | ICD-10-CM | POA: Insufficient documentation

## 2011-07-20 DIAGNOSIS — R1310 Dysphagia, unspecified: Secondary | ICD-10-CM | POA: Insufficient documentation

## 2011-07-20 NOTE — Procedures (Signed)
Objective Swallowing Evaluation: Modified Barium Swallowing Study  Patient Details  Name: Carrie Key MRN: [id number] Date of Birth: [birth year]  Today's Date: 07/20/2011 Time: 1100-1130 SLP Time Calculation (min): 30 min  HPI:  71 y.o. female referred by Dr. Matthias Hughs for an outpatient MBSS today due to symptom report of fullness in throat during meals and occasional coughing.  Patient has had several esophagram studies (2003, 2006 & 2013) with the most recent on 07/13/2011, showing a small type 1 hiatal hernia and cervical spondylosis.  Patient reports having GERD for many years and has changed PPIs a few times and currently taking the generic form on Prilosec 2x a day (before breakfast and in the afternoon).  Patient c/o fullness at night time in bed as "the worst time."     Assessment / Plan / Recommendation Clinical Impression  Dysphagia Diagnosis: Suspected primary esophageal dysphagia Clinical impression: Oral-pharyngeal swallow function was judged to be adequate, timely and functional without any evidence of laryngeal penetration nor tracheal aspiration.  Several esophageal sweeps were performed revealing what appears to be bolus hesitancy in the mid and distal esohpagus with solid textures (puree, cracker and pill).  Patient reported the symptom of fullness during this clinical finding, which is consistent with an esophpageal based dysphagia.  Given patient's known h/o esophageal issues and GERD, coupled with today's findings of a normal oral-pharyngeal swallow, suspect patient's dysphagic symptoms are of a primary esophageal based etiology with sensation of lower esophageal stasis referred to larynx.    Diet Recommendation Regular;Thin liquid   Liquid Administration via: Cup;Straw Medication Administration: Whole meds with liquid Compensations: Follow solids with liquid Postural Changes and/or Swallow Maneuvers: Seated upright 90 degrees;Upright 30-60 min after meal    Follow Up  Recommendations  None    Pertinent Vitals/Pain n/a     Reason for Referral Objectively evaluate swallowing function   Pharyngeal Phase Pharyngeal Phase: Within functional limits   Cervical Esophageal Phase Cervical Esophageal Phase: Telford Nab, M.S.,CCC-SLP Pager [phone] 07/20/2011, 11:55 AM

## 2012-01-12 ENCOUNTER — Other Ambulatory Visit: Payer: Self-pay | Admitting: Gastroenterology

## 2012-11-01 ENCOUNTER — Other Ambulatory Visit: Payer: Self-pay | Admitting: Radiology

## 2012-11-02 ENCOUNTER — Other Ambulatory Visit: Payer: Self-pay | Admitting: Radiology

## 2012-11-02 DIAGNOSIS — D0511 Intraductal carcinoma in situ of right breast: Secondary | ICD-10-CM

## 2012-11-05 ENCOUNTER — Telehealth: Payer: Self-pay | Admitting: *Deleted

## 2012-11-05 DIAGNOSIS — C50411 Malignant neoplasm of upper-outer quadrant of right female breast: Secondary | ICD-10-CM

## 2012-11-05 NOTE — Telephone Encounter (Signed)
Confirmed [facility] for 11/14/12 at 0800.  Instructions and contact information given.

## 2012-11-07 ENCOUNTER — Ambulatory Visit
Admission: RE | Admit: 2012-11-07 | Discharge: 2012-11-07 | Disposition: A | Discharge status: Home / Self Care | Payer: Medicare Other | Source: Ambulatory Visit | Attending: Radiology | Admitting: Radiology

## 2012-11-07 DIAGNOSIS — D0511 Intraductal carcinoma in situ of right breast: Secondary | ICD-10-CM

## 2012-11-07 MED ORDER — GADOBENATE DIMEGLUMINE 529 MG/ML IV SOLN
20.0000 mL | Freq: Once | INTRAVENOUS | Status: AC | PRN
  Administered 2012-11-07: 20 mL via INTRAVENOUS

## 2012-11-14 ENCOUNTER — Encounter: Payer: Self-pay | Admitting: *Deleted

## 2012-11-14 ENCOUNTER — Encounter: Payer: Self-pay | Admitting: Oncology

## 2012-11-14 ENCOUNTER — Ambulatory Visit (HOSPITAL_BASED_OUTPATIENT_CLINIC_OR_DEPARTMENT_OTHER): Payer: Medicare Other | Admitting: Oncology

## 2012-11-14 ENCOUNTER — Ambulatory Visit: Payer: Medicare Other

## 2012-11-14 ENCOUNTER — Other Ambulatory Visit (HOSPITAL_BASED_OUTPATIENT_CLINIC_OR_DEPARTMENT_OTHER): Payer: Medicare Other | Admitting: Lab

## 2012-11-14 ENCOUNTER — Telehealth: Payer: Self-pay | Admitting: *Deleted

## 2012-11-14 ENCOUNTER — Ambulatory Visit (HOSPITAL_BASED_OUTPATIENT_CLINIC_OR_DEPARTMENT_OTHER): Payer: Medicare Other | Admitting: Surgery

## 2012-11-14 ENCOUNTER — Ambulatory Visit
Admission: RE | Admit: 2012-11-14 | Discharge: 2012-11-14 | Disposition: A | Discharge status: Home / Self Care | Payer: Medicare Other | Source: Ambulatory Visit | Attending: Radiation Oncology | Admitting: Radiation Oncology

## 2012-11-14 ENCOUNTER — Ambulatory Visit: Payer: Medicare Other | Attending: Surgery | Admitting: Physical Therapy

## 2012-11-14 VITALS — BP 196/97 | HR 51 | Temp 98.3°F | Resp 19 | Wt 238.2 lb

## 2012-11-14 DIAGNOSIS — C50419 Malignant neoplasm of upper-outer quadrant of unspecified female breast: Secondary | ICD-10-CM

## 2012-11-14 DIAGNOSIS — E119 Type 2 diabetes mellitus without complications: Secondary | ICD-10-CM

## 2012-11-14 DIAGNOSIS — C50919 Malignant neoplasm of unspecified site of unspecified female breast: Secondary | ICD-10-CM

## 2012-11-14 DIAGNOSIS — C50411 Malignant neoplasm of upper-outer quadrant of right female breast: Secondary | ICD-10-CM

## 2012-11-14 DIAGNOSIS — F411 Generalized anxiety disorder: Secondary | ICD-10-CM

## 2012-11-14 DIAGNOSIS — R293 Abnormal posture: Secondary | ICD-10-CM | POA: Insufficient documentation

## 2012-11-14 DIAGNOSIS — I10 Essential (primary) hypertension: Secondary | ICD-10-CM

## 2012-11-14 DIAGNOSIS — Z170 Estrogen receptor positive status [ER+]: Secondary | ICD-10-CM

## 2012-11-14 DIAGNOSIS — M4000 Postural kyphosis, site unspecified: Secondary | ICD-10-CM | POA: Insufficient documentation

## 2012-11-14 DIAGNOSIS — IMO0001 Reserved for inherently not codable concepts without codable children: Secondary | ICD-10-CM | POA: Insufficient documentation

## 2012-11-14 LAB — CBC WITH DIFFERENTIAL/PLATELET
Basophils Absolute: 0.1 10*3/uL (ref 0.0–0.1)
EOS%: 1.6 % (ref 0.0–7.0)
HGB: 11.5 g/dL — ABNORMAL LOW (ref 11.6–15.9)
MCH: 30.1 pg (ref 25.1–34.0)
MCV: 90.2 fL (ref 79.5–101.0)
MONO%: 6.4 % (ref 0.0–14.0)
NEUT%: 66.1 % (ref 38.4–76.8)
RDW: 13 % (ref 11.2–14.5)

## 2012-11-14 LAB — COMPREHENSIVE METABOLIC PANEL (CC13)
AST: 17 U/L (ref 5–34)
Alkaline Phosphatase: 62 U/L (ref 40–150)
BUN: 19.2 mg/dL (ref 7.0–26.0)
Creatinine: 1.3 mg/dL — ABNORMAL HIGH (ref 0.6–1.1)
Potassium: 4 mEq/L (ref 3.5–5.1)
Total Bilirubin: 0.42 mg/dL (ref 0.20–1.20)

## 2012-11-14 NOTE — Progress Notes (Signed)
Checked in new patient with no financial issues. Mail and phone only and she has her appt card. She has her [facility] packet

## 2012-11-14 NOTE — Telephone Encounter (Signed)
appts made and printed...td 

## 2012-11-14 NOTE — Progress Notes (Signed)
Carrie Key is a 72 year old female who presented with a palpable mass. 8 cm a pleomorphic calcifications were noted this with an associated 1.5 cm mass. Biopsy of the mass and calcifications showed 3 areas of low-grade DCIS which was ER positive and PR positive. The clips are 9.8 cm apart. MRI confirmed a 13.2 x 3.2 x 3.9 cm area. A mastectomy has been recommended. Due to all 3 biopsy specimens being positive for DCIS I do not believe she will require postmastectomy radiation.

## 2012-11-14 NOTE — Progress Notes (Signed)
ID: Carrie Key OB: 02/14/1940  MR#: [id number]  CSN#:[id number]  PCP: Alva Garnet., MD GYN:   SUOvidio Kin OTHER MD: Lurline Hare, Robert Buccini  CHIEF COMPLAINT: "I found a lump in my breast".  HISTORY OF PRESENT ILLNESS: Carrie Key noted a lump in the upper-outer quadrant of her right breast and brought it to the attention of her primary care physician. She was set up for bilateral diagnostic mammography 10/31/2012 at [facility]. This showed her breast to be category A., almost entirely fatty. A 1.6 cm irregular high density mass with indistinct margins was noted in the right upper outer quadrant. This was palpable. Ultrasound of the right breast showed the mass to measure 1.5 cm, and to be lobulated. There was no axillary abnormality by ultrasound. Biopsy of the mass in question 11/01/2012 showed ([id number]) invasive ductal carcinoma, grade 1, estrogen and progesterone receptors both 100% positive. Biopsies obtained lateral and medial to this mass on the same day showed an identical morphology.  On 11/07/2012 the patient underwent bilateral breast MRI, which showed a total area of involvement measuring 13.2 cm, extending from the central to the upper lateral right breast. There were no abnormal appearing lymph nodes in the left breast was unremarkable.  Her subsequent history is as detailed below.   INTERVAL HISTORY: Carrie Key was seen at the multidisciplinary breast cancer clinic 11/14/2012 accompanied by her close friend [name].  REVIEW OF SYSTEMS: Aside from anxiety related to her new diagnosis of breast cancer, there were no specific symptoms related to the tumor. She has frequent night sweats and sleeps poorly. She has frequent pain in her right shoulder and leg, and particularly in her right knee. She tells me her sugars are "okay". She does not exercise regularly. A detailed review of systems today was otherwise noncontributory. She is very is aches and muscle cramps  which he feels may be related to her jaw. She feels short of breath when climbing stairs. She has heartburn and sometimes some nausea and vomiting  PAST MEDICAL HISTORY: Past Medical History  Diagnosis Date  . Breast cancer   . Diabetes mellitus without complication   . Hypertension   . GERD (gastroesophageal reflux disease)   . Hyperlipemia     PAST SURGICAL HISTORY: Past Surgical History  Procedure Laterality Date  . Abdominal hysterectomy      FAMILY HISTORY No family history on file. The patient's father died at the age of 24 from a myocardial infarction. The patient's mother died at the age of 17 from a stroke. The patient has 5 brothers, 4 sisters. There is no history of breast or ovarian cancer in the family  GYNECOLOGIC HISTORY:  Menarche age 72, first live birth age 29. The patient is GX P6. She stopped having periods in 1990. She is status post total abdominal hysterectomy with bilateral salpingo-oophorectomy. She never took hormone replacement.  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 She lives by herself, with no pets, although her friend and significant other, [name], visits daily. She has one son who died from a stroke at age 8. Son Carrie Key worked for [employer] in [place]. Daughter Carrie Key works in a factory in [place]. Son Carrie Key lives in [place], and is currently unemployed. Son Carrie Key lives in [place] and works [place] the airport. The patient has 11 grandchildren. She attends [employer]    ADVANCED DIRECTIVES: Not in place   HEALTH MAINTENANCE: History  Substance Use Topics  . Smoking status: Former Games developer  . Smokeless tobacco: Not on file  . Alcohol Use: 2.4 oz/week    4 Glasses of wine per week     Colonoscopy: 2013  PAP: Status post hysterectomy  Bone density: -  Lipid panel:  No Known Allergies  Current Outpatient Prescriptions  Medication Sig Dispense  Refill  . cloNIDine (CATAPRES) 0.3 MG tablet Take 0.3 mg by mouth 2 (two) times daily.      . metFORMIN (GLUCOPHAGE) 500 MG tablet Take 500 mg by mouth daily with breakfast.      . olmesartan-hydrochlorothiazide (BENICAR HCT) 40-25 MG per tablet Take 1 tablet by mouth daily.      Marland Kitchen omeprazole (PRILOSEC) 40 MG capsule Take 40 mg by mouth 2 (two) times daily.      . rosuvastatin (CRESTOR) 10 MG tablet Take 10 mg by mouth daily.      . traMADol (ULTRAM) 50 MG tablet Take 50 mg by mouth daily.       No current facility-administered medications for this visit.    OBJECTIVE: Older African American woman who appears stressed Filed Vitals:   11/14/12 0845  BP: 196/97  Pulse: 51  Temp: 98.3 F (36.8 C)  Resp: 19     There is no height on file to calculate BMI.    ECOG FS:1 - Symptomatic but completely ambulatory  Ocular: Sclerae unicteric, pupils equal, round and reactive to light Ear-nose-throat: Oropharynx clear Lymphatic: No cervical or supraclavicular adenopathy Lungs no rales or rhonchi, fair excursion bilaterally Heart regular rate and rhythm Abd soft, nontender, positive bowel sounds MSK no focal spinal tenderness, no upper extremity lymphedema Neuro: non-focal, well-oriented, anxious affect Breasts: The right breast is status post recent biopsy. I do not palpate a well-defined mass. There are no skin or nipple changes of concern. The right axilla is benign. The left breast is unremarkable   LAB RESULTS:  CMP     Component Value Date/Time   NA 140 11/14/2012 0832   NA 139 05/21/2008 0424   K 4.0 11/14/2012 0832   K 3.8 05/21/2008 0424   CL 102 05/21/2008 0424   CO2 25 11/14/2012 0832   GLUCOSE 118 11/14/2012 0832   GLUCOSE 121* 05/21/2008 0424   BUN 19.2 11/14/2012 0832   BUN 20 05/21/2008 0424   CREATININE 1.3* 11/14/2012 0832   CREATININE 1.2 05/21/2008 0424   CALCIUM 10.1 11/14/2012 0832   PROT 7.8 11/14/2012 0832   ALBUMIN 3.6 11/14/2012 0832   AST 17 11/14/2012 0832   ALT 10  11/14/2012 0832   ALKPHOS 62 11/14/2012 0832   BILITOT 0.42 11/14/2012 0832    I No results found for this basename: SPEP, UPEP,  kappa and lambda light chains    Lab Results  Component Value Date   WBC 6.5 11/14/2012   NEUTROABS 4.3 11/14/2012   HGB 11.5* 11/14/2012   HCT 34.5* 11/14/2012   MCV 90.2 11/14/2012   PLT 240 11/14/2012      Chemistry      Component Value Date/Time   NA 140 11/14/2012 0832   NA 139 05/21/2008 0424   K 4.0 11/14/2012 0832   K 3.8 05/21/2008 0424   CL 102 05/21/2008 0424   CO2 25 11/14/2012 0832   BUN 19.2 11/14/2012 0832   BUN 20 05/21/2008 0424   CREATININE 1.3* 11/14/2012 0832   CREATININE 1.2 05/21/2008 0424      Component Value Date/Time   CALCIUM 10.1 11/14/2012 1610  ALKPHOS 62 11/14/2012 0832   AST 17 11/14/2012 0832   ALT 10 11/14/2012 0832   BILITOT 0.42 11/14/2012 0832       No results found for this basename: LABCA2    No components found with this basename: LABCA125    No results found for this basename: INR,  in the last 168 hours  Urinalysis    Component Value Date/Time   COLORURINE YELLOW 05/21/2008 0410   APPEARANCEUR CLEAR 05/21/2008 0410   LABSPEC 1.010 05/21/2008 0410   PHURINE 7.0 05/21/2008 0410   GLUCOSEU NEGATIVE 05/21/2008 0410   HGBUR NEGATIVE 05/21/2008 0410   BILIRUBINUR NEGATIVE 05/21/2008 0410   KETONESUR NEGATIVE 05/21/2008 0410   PROTEINUR NEGATIVE 05/21/2008 0410   UROBILINOGEN 0.2 05/21/2008 0410   NITRITE NEGATIVE 05/21/2008 0410   LEUKOCYTESUR LARGE* 05/21/2008 0410    STUDIES: Mr Breast Bilateral W Wo Contrast  11/07/2012   CLINICAL DATA:  Patient with newly diagnosed DCIS involving papilloma.  EXAM: BILATERAL BREAST MRI WITH AND WITHOUT CONTRAST  LABS:  BUN and creatinine were obtained on site at [facility] at [address]Results: BUN 14 mg/dL, Creatinine 1.3 mg/dL.  TECHNIQUE: Multiplanar, multisequence MR images of both breasts were obtained prior to and following the intravenous administration of  20ml of MultiHance.  THREE-DIMENSIONAL MR IMAGE RENDERING ON INDEPENDENT WORKSTATION:  Three-dimensional MR images were rendered by post-processing of the original MR data on an independent workstation. The three-dimensional MR images were interpreted, and findings are reported in the following complete MRI report for this study.  COMPARISON:  Previous exams  FINDINGS: Breast composition: b. Scattered fibroglandular tissue  Background parenchymal enhancement: Mild  Right breast: 3 biopsy sites are identified in the central lateral right breast, with susceptibility artifact indicating clip placement. Extensive non mass enhancement is seen involving the central to upper lateral right breast. The area of involvement of non mass enhancement measures 13.2 cm AP x 3.2 cm CC x 3.9 cm TR. The non mass enhancement extends 2 cm anterior to the anterior biopsy site and 1.5 cm posterior to the posterior biopsy site. No additional discrete masses are identified. Biopsy site of the previously sampled mass is located centrally within the large area of non mass enhancement.  Left breast: No mass or abnormal enhancement.  Lymph nodes: No abnormal appearing lymph nodes.  Ancillary findings:  None.  IMPRESSION: 13.2 x 3.2 x 3.9 cm area of non mass enhancement in the central to upper lateral right breast, correlating with the previously biopsied area of DCIS involving a papilloma. This correlates with calcifications mammographically. The non mass enhancement extends 2 cm anterior to the anterior biopsy site and 1.5 cm posterior to the posterior biopsy site. No additional sites of abnormal enhancement. Negative left breast.  RECOMMENDATION: Treatment planning.  BI-RADS CATEGORY  6: Known biopsy-proven malignancy - appropriate action should be taken.   Electronically Signed   By: Jerene Dilling M.D.   On: 11/07/2012 11:40    ASSESSMENT: 72 y.o. [place] woman status post right breast biopsy at 3 separate sites over 8013 cm area  of abnormality in the right breast, all 3 sites showing ductal carcinoma in situ, low-grade, estrogen and progesterone receptors both 100% positive  PLAN: We spent the better part of today's hour-long appointment discussing the biology of breast cancer in general, and the specifics of the patient's tumor in particular. Dawanda understands noninvasive breast cancer is in itself not life-threatening. It does need to be surgically removed, and given the extent of  disease in her right breast, a cystectomy is unavoidable. Because of the very large area in question, there may be an invasive component, and therefore she will also have sentinel lymph node sampling.  She understands that postmastectomy radiation would not be necessary unless there are positive margins or there is extensive invasive disease. Again, if all we are dealing with is in situ breast cancer, mastectomy generally is curative.  She may wish to consider antiestrogens as prophylaxis against the risk of a new breast cancer developing. That risk in her case would be approximately 1/2 a percent per year. She is going to return to see me a few weeks after her definitive surgery, to review her final pathology and to discuss that option.  Jameelah understands the goal of her treatment is cure. She is in agreement with the overall plan, as outlined.   Lowella Dell, MD   11/14/2012 2:02 PM

## 2012-11-14 NOTE — Progress Notes (Addendum)
Re:   Carrie Key DOB:   [birth year] MRN:   [id number]  [facility]  ASSESSMENT AND PLAN: 1.  Right breast cancer, Tis, N0  DCIS on 3 separate biopsies, ER - 100%, PR - 100%,   MRI - 11/07/2012 - 13.2 x 3.2 x 3.9 cm area  Treating oncologist - Magrin[place]  I discussed the options for breast cancer treatment with the patient.  I discussed a multidisciplinary approach to the treatment of breast cancer, which includes medical oncology and radiation oncology.  I discussed the surgical options of lumpectomy vs. Mastectomy.  Because of the size of the area on MRI, the patient is not a candidate for lumpectomy.   I discussed the possibility of reconstruction.  At this time, she does not want reconstruction. I discussed the options of lymph node biopsy.  The treatment plan depends on the pathologic staging of the tumor and the patient's personal wishes.  The risks of surgery include, but are not limited to, bleeding, infection, the need for further surgery, and nerve injury.  The patient has been given literature on the treatment of breast cancer.  Plan: 1) right simple mastectomy with SLNBx, 2) possible anti-estrogen tx [She has requested that her children not be told what kind of surgery she is having.  Near impossible request.  DN  11/23/2102.]  2. Diabetes Mellitus - on oral hypoglycemics 3.  Hypertension 4.  GERD 5.  Hypercholesterolemia 6.  Umbilical hernia 7.  Has left sided abdominal complaints, which sound chronic and maybe be better recently.  No chief complaint on file.  REFERRING PHYSICIAN: Alva Garnet., MD  HISTORY OF PRESENT ILLNESS: Carrie Key is a 72 y.o. (DOB: 07/28/1940)  AA  female whose primary care physician is Alva Garnet., MD and comes to [facility] with a new right breast cancer. Carrie Key, a friend, is with her. She has no family history of breast cancer.  She is not on hormone meds. She has had a hysterectomy.  The patient's last mammogram  was 05/07/2010.   She had recent mammograms on 10/31/2012 at [facility] that showed a 1.6 cm area that was suspicious for cancer. Biopsy x 3 on 11/02/2102 (Accession: [id number] showed DCIS involving a papilloma x 3. She had a MRI 11/07/2012 that showed:  a 13.2 x 3.2 x 3.9 cm area of non mass enhancement in the central to upper lateral right breast, correlating with the previously biopsied area of DCIS involving a papilloma. This correlates with calcifications mammographically. The non mass enhancement extends 2 cm anterior to the anterior biopsy site and 1.5 cm posterior to the posterior biopsy site.   Past Medical History  Diagnosis Date  . Breast cancer   . Diabetes mellitus without complication   . Hypertension   . GERD (gastroesophageal reflux disease)   . Hyperlipemia       Past Surgical History  Procedure Laterality Date  . Abdominal hysterectomy        Current Outpatient Prescriptions  Medication Sig Dispense Refill  . cloNIDine (CATAPRES) 0.3 MG tablet Take 0.3 mg by mouth 2 (two) times daily.      . metFORMIN (GLUCOPHAGE) 500 MG tablet Take 500 mg by mouth daily with breakfast.      . olmesartan-hydrochlorothiazide (BENICAR HCT) 40-25 MG per tablet Take 1 tablet by mouth daily.      Marland Kitchen omeprazole (PRILOSEC) 40 MG capsule Take 40 mg by mouth 2 (two) times daily.      . rosuvastatin (CRESTOR) 10 MG  tablet Take 10 mg by mouth daily.      . traMADol (ULTRAM) 50 MG tablet Take 50 mg by mouth daily.       No current facility-administered medications for this visit.     No Known Allergies  REVIEW OF SYSTEMS: Skin:  No history of rash.  No history of abnormal moles. Infection:  No history of hepatitis or HIV.  No history of MRSA. Neurologic:  No history of stroke.  No history of seizure.  No history of headaches. Cardiac:  Hypertension since 1994.  Seen by cardiologist remotely at [facility]. Pulmonary:  Does not smoke cigarettes.  No asthma or bronchitis.  No  OSA/CPAP.  Endocrine:  Diabetes mellitus since 2012. No thyroid disease.  Hypercholesterolemia. Gastrointestinal:  No history of stomach disease.  No history of liver disease.  No history of gall bladder disease.  No history of pancreas disease.  No history of colon disease.  Right sided abdominal pain, maybe better recently. Urologic:  No history of kidney stones.  No history of bladder infections. Musculoskeletal:  Right knee pain - on Tramadol.  Back surgery 1997 - has done well from this. Hematologic:  No bleeding disorder.  No history of anemia.  Not anticoagulated. Psycho-social:  The patient is oriented.   The patient has no obvious psychologic or social impairment to understanding our conversation and plan.  SOCIAL and FAMILY HISTORY: Widowed. 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 Carrie Key, a friend, is with her. She has 4 living children.  3 boys and 1 girl, ages 13 to 89.  Her daughter lives in [place]. She is planning a family trip to [facility] next week.  PHYSICAL EXAM: There were no vitals taken for this visit.  General: AA F who is alert and generally healthy appearing.  HEENT: Normal. Pupils equal. Neck: Supple. No mass.  No thyroid mass. Lymph Nodes:  No supraclavicular, cervical, or axillary nodes. Breasts:  Right:  Mass effect in UOQ.  No nipple dishcarge  Left - No mass Lungs: Clear to auscultation and symmetric breath sounds. Heart:  RRR. No murmur or rub.  Abdomen: Soft. No mass. No tenderness. No hernia. Normal bowel sounds.  Umbilical hernia.  Well healed lower midline incision. Rectal: Not done. Extremities:  Good strength and ROM  in upper and lower extremities. Neurologic:  Grossly intact to motor and sensory function. Psychiatric: Has normal mood and affect. Behavior is normal.   DATA REVIEWED: Epic notes  Ovidio Kin, MD,  [facility] [address] [place]   [address]   [place], [place]    [address] Phone:  [phone] FAX:  [phone]

## 2012-11-14 NOTE — Progress Notes (Signed)
[facility] Clinical Social Work Clinic Note Clinical Social Work met with Pt and her friend during breast clinic to offer support, discuss [facility] resources/programs, review distress screen and assess for other needs.  Pt very quiet today and denies any concerns. She scored a 0 on her distress screen, which indicates no distress. CSW discussed this with her further and other resources to assist. Pt appeared slightly flat, but stated she was doing well with her current concerns. CSW attempted to educate Pt that her concerns may change and that was ok.   Pt not really open to CSW engagement, but aware and agrees to seek out our asst as needed.   Clinical Social Work interventions:  resource and referral.   Pt agrees to reach out to CSW as needed and is aware of how to contact support team.  Doreen Salvage, LCSW Clinical Social Worker [facility] Patient & Family Support [facility] Wednesday, Thursday and Friday Phone: [phone] Fax: [phone]

## 2012-11-15 ENCOUNTER — Other Ambulatory Visit (INDEPENDENT_AMBULATORY_CARE_PROVIDER_SITE_OTHER): Payer: Self-pay | Admitting: Surgery

## 2012-11-15 DIAGNOSIS — C50911 Malignant neoplasm of unspecified site of right female breast: Secondary | ICD-10-CM

## 2012-11-21 ENCOUNTER — Telehealth: Payer: Self-pay | Admitting: *Deleted

## 2012-11-21 NOTE — Telephone Encounter (Signed)
I called patient to f/u from [facility] 11/14/12.  Patient denied any questions about diagnosis or treatment plan.  She did ask that her children not know what kind of surgery she is having.  Her children will be present with her on the day of surgery.  I notified [facility] Sheldon [facility] and sent In Basket message to Dr. Ezzard Standing.  Patient denied any other concerns at this time.  I confirmed surgery date, f/u appointment with Dr. Darnelle Catalan and verified that patient had [facility] contact information.  Patient encouraged her to call for any needs.

## 2012-11-23 ENCOUNTER — Telehealth: Payer: Self-pay | Admitting: *Deleted

## 2012-11-23 NOTE — Telephone Encounter (Signed)
I called patient and discussed her request that her children not know what surgery she is having.  I explained that there would be the need to discuss her procedure prior to and after surgery.  We discussed that her children should remain in the lobby area and not accompany her to the treatment area to avoid them being present when these conversations need to occur.  Patient reports that her children are aware that she is having breast surgery for breast cancer but they do not know that she is having her breast removed.  Patient verbalized understanding.

## 2012-11-26 ENCOUNTER — Encounter (HOSPITAL_BASED_OUTPATIENT_CLINIC_OR_DEPARTMENT_OTHER): Payer: Self-pay | Admitting: *Deleted

## 2012-11-26 NOTE — Progress Notes (Signed)
To come in for a1c-ekg-had cbc  cmet 11/14/12 Did not want anyone to know about surgery-now cecil knows she is having breast surgery-he will be with her To bring all meds and overnight bag

## 2012-11-29 ENCOUNTER — Encounter (HOSPITAL_BASED_OUTPATIENT_CLINIC_OR_DEPARTMENT_OTHER)
Admission: RE | Admit: 2012-11-29 | Discharge: 2012-11-29 | Disposition: A | Discharge status: Home / Self Care | Payer: Medicare Other | Source: Ambulatory Visit | Attending: Surgery | Admitting: Surgery

## 2012-11-29 DIAGNOSIS — Z01810 Encounter for preprocedural cardiovascular examination: Secondary | ICD-10-CM | POA: Insufficient documentation

## 2012-12-03 ENCOUNTER — Ambulatory Visit (HOSPITAL_BASED_OUTPATIENT_CLINIC_OR_DEPARTMENT_OTHER)
Admission: RE | Admit: 2012-12-03 | Discharge: 2012-12-04 | Disposition: A | Discharge status: Home / Self Care | Payer: Medicare Other | Source: Ambulatory Visit | Attending: Surgery | Admitting: Surgery

## 2012-12-03 ENCOUNTER — Ambulatory Visit (HOSPITAL_BASED_OUTPATIENT_CLINIC_OR_DEPARTMENT_OTHER): Payer: Medicare Other | Admitting: *Deleted

## 2012-12-03 ENCOUNTER — Encounter (HOSPITAL_BASED_OUTPATIENT_CLINIC_OR_DEPARTMENT_OTHER): Payer: Medicare Other | Admitting: *Deleted

## 2012-12-03 ENCOUNTER — Encounter (HOSPITAL_BASED_OUTPATIENT_CLINIC_OR_DEPARTMENT_OTHER)
Admission: RE | Disposition: A | Discharge status: Home / Self Care | Payer: Self-pay | Source: Ambulatory Visit | Attending: Surgery

## 2012-12-03 ENCOUNTER — Encounter (HOSPITAL_BASED_OUTPATIENT_CLINIC_OR_DEPARTMENT_OTHER): Payer: Self-pay | Admitting: *Deleted

## 2012-12-03 ENCOUNTER — Encounter (HOSPITAL_COMMUNITY)
Admission: RE | Admit: 2012-12-03 | Discharge: 2012-12-03 | Disposition: A | Discharge status: Home / Self Care | Payer: Medicare Other | Source: Ambulatory Visit | Attending: Surgery | Admitting: Surgery

## 2012-12-03 DIAGNOSIS — C50911 Malignant neoplasm of unspecified site of right female breast: Secondary | ICD-10-CM

## 2012-12-03 DIAGNOSIS — C50919 Malignant neoplasm of unspecified site of unspecified female breast: Secondary | ICD-10-CM | POA: Insufficient documentation

## 2012-12-03 DIAGNOSIS — E119 Type 2 diabetes mellitus without complications: Secondary | ICD-10-CM | POA: Insufficient documentation

## 2012-12-03 DIAGNOSIS — K429 Umbilical hernia without obstruction or gangrene: Secondary | ICD-10-CM | POA: Insufficient documentation

## 2012-12-03 DIAGNOSIS — K219 Gastro-esophageal reflux disease without esophagitis: Secondary | ICD-10-CM | POA: Insufficient documentation

## 2012-12-03 DIAGNOSIS — E7800 Pure hypercholesterolemia, unspecified: Secondary | ICD-10-CM | POA: Insufficient documentation

## 2012-12-03 DIAGNOSIS — Z01810 Encounter for preprocedural cardiovascular examination: Secondary | ICD-10-CM | POA: Insufficient documentation

## 2012-12-03 DIAGNOSIS — Z01812 Encounter for preprocedural laboratory examination: Secondary | ICD-10-CM | POA: Insufficient documentation

## 2012-12-03 DIAGNOSIS — I10 Essential (primary) hypertension: Secondary | ICD-10-CM | POA: Insufficient documentation

## 2012-12-03 DIAGNOSIS — D0590 Unspecified type of carcinoma in situ of unspecified breast: Secondary | ICD-10-CM

## 2012-12-03 DIAGNOSIS — C50411 Malignant neoplasm of upper-outer quadrant of right female breast: Secondary | ICD-10-CM

## 2012-12-03 HISTORY — DX: Presence of spectacles and contact lenses: Z97.3

## 2012-12-03 HISTORY — PX: SIMPLE MASTECTOMY WITH AXILLARY SENTINEL NODE BIOPSY: SHX6098

## 2012-12-03 LAB — POCT HEMOGLOBIN-HEMACUE: Hemoglobin: 12.1 g/dL (ref 12.0–15.0)

## 2012-12-03 LAB — GLUCOSE, CAPILLARY
Glucose-Capillary: 87 mg/dL (ref 70–99)
Glucose-Capillary: 99 mg/dL (ref 70–99)

## 2012-12-03 SURGERY — SIMPLE MASTECTOMY WITH AXILLARY SENTINEL NODE BIOPSY
Anesthesia: General | Laterality: Right | Wound class: Clean

## 2012-12-03 MED ORDER — MIDAZOLAM HCL 2 MG/2ML IJ SOLN
1.0000 mg | INTRAMUSCULAR | Status: DC | PRN
  Administered 2012-12-03: 1 mg via INTRAVENOUS

## 2012-12-03 MED ORDER — OXYCODONE HCL 5 MG/5ML PO SOLN: 5.0000 mg | Freq: Once | ORAL | Status: AC | PRN

## 2012-12-03 MED ORDER — FENTANYL CITRATE 0.05 MG/ML IJ SOLN
50.0000 ug | INTRAMUSCULAR | Status: DC | PRN
  Administered 2012-12-03: 50 ug via INTRAVENOUS

## 2012-12-03 MED ORDER — ONDANSETRON HCL 4 MG/2ML IJ SOLN: 4.0000 mg | Freq: Once | INTRAMUSCULAR | Status: AC | PRN

## 2012-12-03 MED ORDER — ONDANSETRON HCL 4 MG PO TABS: 4.0000 mg | ORAL_TABLET | Freq: Four times a day (QID) | ORAL | Status: DC | PRN

## 2012-12-03 MED ORDER — ONDANSETRON HCL 4 MG/2ML IJ SOLN: 4.0000 mg | Freq: Four times a day (QID) | INTRAMUSCULAR | Status: DC | PRN

## 2012-12-03 MED ORDER — LIDOCAINE HCL (CARDIAC) 20 MG/ML IV SOLN
INTRAVENOUS | Status: DC | PRN
  Administered 2012-12-03: 50 mg via INTRAVENOUS

## 2012-12-03 MED ORDER — TECHNETIUM TC 99M SULFUR COLLOID FILTERED
1.0000 | Freq: Once | INTRAVENOUS | Status: AC | PRN
  Administered 2012-12-03: 1 via INTRADERMAL

## 2012-12-03 MED ORDER — MORPHINE SULFATE 2 MG/ML IJ SOLN: 1.0000 mg | INTRAMUSCULAR | Status: DC | PRN

## 2012-12-03 MED ORDER — HEPARIN SODIUM (PORCINE) 5000 UNIT/ML IJ SOLN
INTRAMUSCULAR | Status: AC
  Filled 2012-12-03: qty 1

## 2012-12-03 MED ORDER — CLONIDINE HCL 0.3 MG PO TABS: 0.3000 mg | ORAL_TABLET | Freq: Two times a day (BID) | ORAL | Status: DC

## 2012-12-03 MED ORDER — 0.9 % SODIUM CHLORIDE (POUR BTL) OPTIME
TOPICAL | Status: DC | PRN
  Administered 2012-12-03: 1500 mL

## 2012-12-03 MED ORDER — HEPARIN SODIUM (PORCINE) 5000 UNIT/ML IJ SOLN
5000.0000 [IU] | Freq: Three times a day (TID) | INTRAMUSCULAR | Status: DC
  Administered 2012-12-03: 5000 [IU] via SUBCUTANEOUS

## 2012-12-03 MED ORDER — OXYCODONE HCL 5 MG PO TABS
5.0000 mg | ORAL_TABLET | Freq: Once | ORAL | Status: AC | PRN
  Administered 2012-12-03: 5 mg via ORAL

## 2012-12-03 MED ORDER — PROPOFOL 10 MG/ML IV BOLUS
INTRAVENOUS | Status: DC | PRN
  Administered 2012-12-03: 200 mg via INTRAVENOUS

## 2012-12-03 MED ORDER — HYDROMORPHONE HCL PF 1 MG/ML IJ SOLN
0.2500 mg | INTRAMUSCULAR | Status: DC | PRN
  Administered 2012-12-03 (×2): 0.5 mg via INTRAVENOUS

## 2012-12-03 MED ORDER — PANTOPRAZOLE SODIUM 40 MG PO TBEC: 40.0000 mg | DELAYED_RELEASE_TABLET | Freq: Every day | ORAL | Status: DC

## 2012-12-03 MED ORDER — HYDROMORPHONE HCL PF 1 MG/ML IJ SOLN
INTRAMUSCULAR | Status: AC
  Filled 2012-12-03: qty 1

## 2012-12-03 MED ORDER — AMLODIPINE BESYLATE 10 MG PO TABS: 10.0000 mg | ORAL_TABLET | Freq: Every day | ORAL | Status: DC

## 2012-12-03 MED ORDER — POTASSIUM CHLORIDE IN NACL 20-0.45 MEQ/L-% IV SOLN: INTRAVENOUS | Status: DC

## 2012-12-03 MED ORDER — FENTANYL CITRATE 0.05 MG/ML IJ SOLN
INTRAMUSCULAR | Status: AC
  Filled 2012-12-03: qty 2

## 2012-12-03 MED ORDER — MIDAZOLAM HCL 2 MG/2ML IJ SOLN
INTRAMUSCULAR | Status: AC
  Filled 2012-12-03: qty 2

## 2012-12-03 MED ORDER — HYDROCODONE-ACETAMINOPHEN 5-325 MG PO TABS
1.0000 | ORAL_TABLET | ORAL | Status: DC | PRN
  Administered 2012-12-03 – 2012-12-04 (×3): 2 via ORAL

## 2012-12-03 MED ORDER — SODIUM CHLORIDE 0.9 % IJ SOLN
INTRAMUSCULAR | Status: AC
  Filled 2012-12-03: qty 10

## 2012-12-03 MED ORDER — TRAMADOL HCL 50 MG PO TABS
50.0000 mg | ORAL_TABLET | Freq: Every day | ORAL | Status: DC
  Administered 2012-12-03: 50 mg via ORAL

## 2012-12-03 MED ORDER — OLMESARTAN MEDOXOMIL-HCTZ 40-25 MG PO TABS: 1.0000 | ORAL_TABLET | Freq: Every day | ORAL | Status: DC

## 2012-12-03 MED ORDER — ONDANSETRON HCL 4 MG/2ML IJ SOLN
INTRAMUSCULAR | Status: DC | PRN
  Administered 2012-12-03: 4 mg via INTRAVENOUS

## 2012-12-03 MED ORDER — LACTATED RINGERS IV SOLN
INTRAVENOUS | Status: DC
  Administered 2012-12-03 (×2): via INTRAVENOUS

## 2012-12-03 MED ORDER — FENTANYL CITRATE 0.05 MG/ML IJ SOLN
INTRAMUSCULAR | Status: AC
  Filled 2012-12-03: qty 4

## 2012-12-03 MED ORDER — CEFAZOLIN SODIUM-DEXTROSE 2-3 GM-% IV SOLR
INTRAVENOUS | Status: AC
  Filled 2012-12-03: qty 50

## 2012-12-03 MED ORDER — POTASSIUM CHLORIDE IN NACL 20-0.9 MEQ/L-% IV SOLN: INTRAVENOUS | Status: DC

## 2012-12-03 MED ORDER — CEFAZOLIN SODIUM-DEXTROSE 2-3 GM-% IV SOLR
2.0000 g | INTRAVENOUS | Status: AC
  Administered 2012-12-03: 2 g via INTRAVENOUS

## 2012-12-03 MED ORDER — HYDROCODONE-ACETAMINOPHEN 5-325 MG PO TABS
ORAL_TABLET | ORAL | Status: AC
  Filled 2012-12-03: qty 2

## 2012-12-03 MED ORDER — METHYLENE BLUE 1 % INJ SOLN
INTRAMUSCULAR | Status: AC
  Filled 2012-12-03: qty 10

## 2012-12-03 MED ORDER — SODIUM CHLORIDE 0.9 % IJ SOLN
INTRAMUSCULAR | Status: DC | PRN
  Administered 2012-12-03: 14:00:00

## 2012-12-03 MED ORDER — CHLORHEXIDINE GLUCONATE 4 % EX LIQD: 1.0000 "application " | Freq: Once | CUTANEOUS | Status: DC

## 2012-12-03 MED ORDER — FENTANYL CITRATE 0.05 MG/ML IJ SOLN
INTRAMUSCULAR | Status: DC | PRN
  Administered 2012-12-03 (×2): 50 ug via INTRAVENOUS

## 2012-12-03 MED ORDER — OXYCODONE HCL 5 MG PO TABS
ORAL_TABLET | ORAL | Status: AC
  Filled 2012-12-03: qty 1

## 2012-12-03 MED ORDER — METFORMIN HCL 500 MG PO TABS
500.0000 mg | ORAL_TABLET | Freq: Every day | ORAL | Status: DC
  Administered 2012-12-04: 500 mg via ORAL

## 2012-12-03 MED ORDER — DEXAMETHASONE SODIUM PHOSPHATE 4 MG/ML IJ SOLN
INTRAMUSCULAR | Status: DC | PRN
  Administered 2012-12-03: 4 mg via INTRAVENOUS

## 2012-12-03 SURGICAL SUPPLY — 58 items
ADH SKN CLS APL DERMABOND .7 (GAUZE/BANDAGES/DRESSINGS) ×1
APL SKNCLS STERI-STRIP NONHPOA (GAUZE/BANDAGES/DRESSINGS) ×1
APPLIER CLIP 9.375 MED OPEN (MISCELLANEOUS) ×2
APR CLP MED 9.3 20 MLT OPN (MISCELLANEOUS) ×1
BENZOIN TINCTURE PRP APPL 2/3 (GAUZE/BANDAGES/DRESSINGS) ×1 IMPLANT
BINDER BREAST LRG (GAUZE/BANDAGES/DRESSINGS) IMPLANT
BINDER BREAST XLRG (GAUZE/BANDAGES/DRESSINGS) IMPLANT
BINDER BREAST XXLRG (GAUZE/BANDAGES/DRESSINGS) ×1 IMPLANT
BLADE HEX COATED 2.75 (ELECTRODE) ×2 IMPLANT
CANISTER SUCTION 2500CC (MISCELLANEOUS) ×2 IMPLANT
CAUTERY EYE LOW TEMP 1300F FIN (OPHTHALMIC RELATED) ×1 IMPLANT
CHLORAPREP W/TINT 26ML (MISCELLANEOUS) ×2 IMPLANT
CLIP APPLIE 9.375 MED OPEN (MISCELLANEOUS) ×1 IMPLANT
COVER MAYO STAND STRL (DRAPES) ×2 IMPLANT
COVER PROBE W GEL 5X96 (DRAPES) ×2 IMPLANT
COVER TABLE BACK 60X90 (DRAPES) ×2 IMPLANT
DERMABOND ADVANCED (GAUZE/BANDAGES/DRESSINGS) ×1
DERMABOND ADVANCED .7 DNX12 (GAUZE/BANDAGES/DRESSINGS) ×1 IMPLANT
DRAIN CHANNEL 19F RND (DRAIN) ×3 IMPLANT
DRAPE LAPAROSCOPIC ABDOMINAL (DRAPES) ×2 IMPLANT
DRAPE UTILITY XL STRL (DRAPES) ×1 IMPLANT
DRSG PAD ABDOMINAL 8X10 ST (GAUZE/BANDAGES/DRESSINGS) ×1 IMPLANT
ELECT REM PT RETURN 9FT ADLT (ELECTROSURGICAL) ×2
ELECTRODE REM PT RTRN 9FT ADLT (ELECTROSURGICAL) ×1 IMPLANT
EVACUATOR SILICONE 100CC (DRAIN) ×3 IMPLANT
GLOVE BIOGEL M 7.0 STRL (GLOVE) ×1 IMPLANT
GLOVE BIOGEL M STRL SZ7.5 (GLOVE) ×1 IMPLANT
GLOVE BIOGEL PI IND STRL 7.5 (GLOVE) IMPLANT
GLOVE BIOGEL PI IND STRL 8 (GLOVE) IMPLANT
GLOVE BIOGEL PI INDICATOR 7.5 (GLOVE) ×1
GLOVE BIOGEL PI INDICATOR 8 (GLOVE) ×1
GLOVE EXAM NITRILE MD LF STRL (GLOVE) ×1 IMPLANT
GLOVE SURG SIGNA 7.5 PF LTX (GLOVE) ×2 IMPLANT
GOWN BRE IMP PREV XXLGXLNG (GOWN DISPOSABLE) ×2 IMPLANT
GOWN PREVENTION PLUS XLARGE (GOWN DISPOSABLE) ×3 IMPLANT
GOWN STRL REIN 2XL LVL4 (GOWN DISPOSABLE) ×1 IMPLANT
KIT ROOM TURNOVER OR (KITS) ×2 IMPLANT
NDL HYPO 25X1 1.5 SAFETY (NEEDLE) ×1 IMPLANT
NDL SAFETY ECLIPSE 18X1.5 (NEEDLE) ×1 IMPLANT
NEEDLE HYPO 18GX1.5 SHARP (NEEDLE)
NEEDLE HYPO 25X1 1.5 SAFETY (NEEDLE) ×2 IMPLANT
NS IRRIG 1000ML POUR BTL (IV SOLUTION) ×2 IMPLANT
PACK BASIN DAY SURGERY FS (CUSTOM PROCEDURE TRAY) ×2 IMPLANT
SHEET MEDIUM DRAPE 40X70 STRL (DRAPES) ×2 IMPLANT
SLEEVE SCD COMPRESS KNEE MED (MISCELLANEOUS) ×1 IMPLANT
SPONGE GAUZE 4X4 12PLY (GAUZE/BANDAGES/DRESSINGS) ×1 IMPLANT
STAPLER VISISTAT 35W (STAPLE) ×2 IMPLANT
STRIP CLOSURE SKIN 1/2X4 (GAUZE/BANDAGES/DRESSINGS) ×2 IMPLANT
SUT ETHILON 2 0 FS 18 (SUTURE) ×3 IMPLANT
SUT MNCRL AB 4-0 PS2 18 (SUTURE) ×2 IMPLANT
SUT SILK 2 0 FS (SUTURE) ×2 IMPLANT
SUT VIC AB 3-0 SH 18 (SUTURE) ×3 IMPLANT
SYR CONTROL 10ML LL (SYRINGE) ×2 IMPLANT
TOWEL OR 17X24 6PK STRL BLUE (TOWEL DISPOSABLE) ×2 IMPLANT
TOWEL OR NON WOVEN STRL DISP B (DISPOSABLE) ×1 IMPLANT
TUBE CONNECTING 12X1/4 (SUCTIONS) ×2 IMPLANT
VAC PENCILS W/TUBING CLEAR (MISCELLANEOUS) ×2 IMPLANT
YANKAUER SUCT BULB TIP NO VENT (SUCTIONS) ×1 IMPLANT

## 2012-12-03 NOTE — H&P (Signed)
Re: Carrie Key  DOB: [birth year]  MRN: [id number]   [facility]   ASSESSMENT AND PLAN:  1. Right breast cancer, Tis, N0   DCIS on 3 separate biopsies, ER - 100%, PR - 100%,   MRI - 11/07/2012 - 13.2 x 3.2 x 3.9 cm area   Treating oncologist - Magrinat/Wentworth   I discussed the options for breast cancer treatment with the patient. I discussed a multidisciplinary approach to the treatment of breast cancer, which includes medical oncology and radiation oncology. I discussed the surgical options of lumpectomy vs. Mastectomy.   Because of the size of the area on MRI, the patient is not a candidate for lumpectomy. I discussed the possibility of reconstruction. At this time, she does not want reconstruction. I discussed the options of lymph node biopsy. The treatment plan depends on the pathologic staging of the tumor and the patient's personal wishes.   The risks of surgery include, but are not limited to, bleeding, infection, the need for further surgery, and nerve injury.  The patient has been given literature on the treatment of breast cancer.   Plan: 1) right simple mastectomy with SLNBx, 2) possible anti-estrogen tx  [She has requested that her children not be told what kind of surgery she is having. Near impossible request. DN 11/23/2102.]  She has agreed today to let me tell her children.  2. Diabetes Mellitus - on oral hypoglycemics  3. Hypertension  4. GERD  5. Hypercholesterolemia  6. Umbilical hernia  7. Has left sided abdominal complaints, which sound chronic and maybe be better recently.   No chief complaint on file.   REFERRING PHYSICIAN: Alva Key., MD   HISTORY OF PRESENT ILLNESS:  Carrie Key is a 72 y.o. (DOB: 1941/01/24) AA female whose primary care physician is Carrie Key., MD and comes to [facility] with a new right breast cancer.   Carrie Key, a friend, is with her.   She has no family history of breast cancer. She is not on hormone meds. She has  had a hysterectomy.   The patient's last mammogram was 05/07/2010. She had recent mammograms on 10/31/2012 at [facility] that showed a 1.6 cm area that was suspicious for cancer.   Biopsy x 3 on 11/02/2102 (Accession: [id number] showed DCIS involving a papilloma x 3.   She had a MRI 11/07/2012 that showed: a 13.2 x 3.2 x 3.9 cm area of non mass enhancement in the central to upper lateral right breast, correlating with the previously biopsied area of DCIS involving a papilloma. This correlates with calcifications mammographically. The non mass enhancement extends 2 cm anterior to the anterior biopsy site and 1.5 cm posterior to the posterior biopsy site.   Past Medical History   Diagnosis  Date   .  Breast cancer    .  Diabetes mellitus without complication    .  Hypertension    .  GERD (gastroesophageal reflux disease)    .  Hyperlipemia     Past Surgical History   Procedure  Laterality  Date   .  Abdominal hysterectomy      Current Outpatient Prescriptions   Medication  Sig  Dispense  Refill   .  cloNIDine (CATAPRES) 0.3 MG tablet  Take 0.3 mg by mouth 2 (two) times daily.     .  metFORMIN (GLUCOPHAGE) 500 MG tablet  Take 500 mg by mouth daily with breakfast.     .  olmesartan-hydrochlorothiazide (BENICAR HCT) 40-25 MG per tablet  Take  1 tablet by mouth daily.     Marland Kitchen  omeprazole (PRILOSEC) 40 MG capsule  Take 40 mg by mouth 2 (two) times daily.     .  rosuvastatin (CRESTOR) 10 MG tablet  Take 10 mg by mouth daily.     .  traMADol (ULTRAM) 50 MG tablet  Take 50 mg by mouth daily.      No current facility-administered medications for this visit.   No Known Allergies   REVIEW OF SYSTEMS:  Skin: No history of rash. No history of abnormal moles.  Infection: No history of hepatitis or HIV. No history of MRSA.  Neurologic: No history of stroke. No history of seizure. No history of headaches.  Cardiac: Hypertension since 1994. Seen by cardiologist remotely at [facility] cardiology.  Pulmonary: Does not  smoke cigarettes. No asthma or bronchitis. No OSA/CPAP.  Endocrine: Diabetes mellitus since 2012. No thyroid disease. Hypercholesterolemia.  Gastrointestinal: No history of stomach disease. No history of liver disease. No history of gall bladder disease. No history of pancreas disease. No history of colon disease. Right sided abdominal pain, maybe better recently.  Urologic: No history of kidney stones. No history of bladder infections.  Musculoskeletal: Right knee pain - on Tramadol. Back surgery 1997 - has done well from this.  Hematologic: No bleeding disorder. No history of anemia. Not anticoagulated.  Psycho-social: The patient is oriented. The patient has no obvious psychologic or social impairment to understanding our conversation and plan.   SOCIAL and FAMILY HISTORY:  Widowed.  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  Carrie Key, a friend, is with her.  She has 4 living children. 3 boys and 1 girl, ages 71 to 16. Her daughter lives in [place].  She is planning a family trip to [facility] next week.   PHYSICAL EXAM:  BP 139/66  Pulse 51  Temp(Src) 98.2 F (36.8 C) (Oral)  Resp 14  Ht 5\' 9"  (1.753 m)  Wt 239 lb (108.41 kg)  BMI 35.28 kg/m2  SpO2 100%  General: AA F who is alert and generally healthy appearing.  HEENT: Normal. Pupils equal.  Neck: Supple. No mass. No thyroid mass.  Lymph Nodes: No supraclavicular, cervical, or axillary nodes.  Breasts: Right: Mass effect in UOQ. No nipple dishcarge  Left - No mass  Lungs: Clear to auscultation and symmetric breath sounds.  Heart: RRR. No murmur or rub.  Abdomen: Soft. No mass. No tenderness. No hernia. Normal bowel sounds. Umbilical hernia. Well healed lower midline incision.  Rectal: Not done.  Extremities: Good strength and ROM in upper and lower extremities.  Neurologic: Grossly intact to motor and sensory function.  Psychiatric: Has normal mood and affect.  Behavior is normal.   DATA REVIEWED:  Epic notes   Ovidio Kin, MD, [facility]  [address]  [place], [place] [address]  Phone: [phone] FAX: [phone]

## 2012-12-03 NOTE — Anesthesia Postprocedure Evaluation (Signed)
  Anesthesia Post-op Note  Patient: Carrie Key  Procedure(s) Performed: Procedure(s): RIGHT SIMPLE MASTECTOMY WITH RIGHT  AXILLARY SENTINEL LYMPH NODE BIOPSY (Right)  Patient Location: PACU  Anesthesia Type:General  Level of Consciousness: awake, alert  and oriented  Airway and Oxygen Therapy: Patient Spontanous Breathing and Patient connected to face mask oxygen  Post-op Pain: mild  Post-op Assessment: Post-op Vital signs reviewed  Post-op Vital Signs: Reviewed  Complications: No apparent anesthesia complications

## 2012-12-03 NOTE — Op Note (Addendum)
12/03/2012  3:38 PM  PATIENT:  Carrie Key, 72 y.o., female, MRN: [id number]  PREOP DIAGNOSIS:  Right breast cancer (DCIS)  POSTOP DIAGNOSIS:   Right breast cancer (DCIS)  , 10 o'clock position (Tis, N0)  PROCEDURE:   Procedure(s): RIGHT SIMPLE MASTECTOMY WITH RIGHT  AXILLARY SENTINEL LYMPH NODE BIOPSY, Injection of Methylene blue (1.5 cc of 40% Methylene blue)  SURGEON:   Ovidio Kin, M.D.  ASSISTANT:   None  ANESTHESIA:   general  Anesthesiologist: Kerby Nora, MD CRNA: Curly Shores, CRNA  General  ASA:  3  EBL:  150  ml  DRAINS: Two #19 French Blake drains  LOCAL MEDICATIONS USED:   None  SPECIMEN:   Right breast and right axillary lymph node  COUNTS CORRECT:  YES  INDICATIONS FOR PROCEDURE:  Carrie Key is a 72 y.o. (DOB: [birth year]) AA  female whose primary care physician is Alva Garnet., MD and comes for right simple mastectomy and right axillary sentinel lymph node biopsy.   The indications and risks of the surgery were explained to the patient.  The risks include, but are not limited to, infection, bleeding, and nerve injury.  OPERATIVE NOTE;  The patient was taken to room # 2 at [facility] where she underwent a general anesthesia  supervised by Anesthesiologist: Kerby Nora, MD CRNA: Curly Shores, CRNA. Her right breast and axilla were prepped with  ChloraPrep and sterilely draped.    A time-out and the surgical check list was reviewed.    I injected about 1.5 mL of 40% methylene blue around her right areola.   I made an elliptical incision including the areola in the right breast.  I developed skin flaps medially to the lateral edge of the sternum, inferiorly to the investing fascia of the rectus abdominus muscle, laterally to the anterior edge of the latissimus dorsi muscle, and superiorly to about 2 finger breaths below the clavicle.  The breast was reflected off the pectoralis muscle from medial to lateral.  The lateral attachments in the right  axilla were divided and the breast removed.  A long suture was placed on the lateral aspect of the breast.   I dissected into the right axilla and found a sentinel lymph node.  The node had counts of 80 with a background count of 0.  The lymph node was blue.  This was sent as a separate specimen.   I brought out 2 15 F Blake drains below the inferior flaps.  These were sewn in place with 2-0 Nylons.  I irrigated the wound with 2,000 cc of fluid.   The skin was closed with interrupted 3-0 Vicryl sutures and the skin was closed with a 4-0 Monocryl.  The  Wound was painted with Tincture of Benzoin and steristripped with 1/2 inch Steristrips.    A pressure dressing was placed on the wound and the chest wrapped with a breast binder.  Her needle and sponge count were correct at the end of the case.   She was transferred to the recovery room in good condition.   Ms. Henrie gave me permission to talk to her children.  3 of her 4 children are here.  I reviewed the findings and plan with Carlena Bjornstad, and Dexter.   Ovidio Kin, MD, [facility] Pager: [phone] Office phone:  [phone]

## 2012-12-03 NOTE — Anesthesia Preprocedure Evaluation (Addendum)
Anesthesia Evaluation  Patient identified by MRN, date of birth, ID band Patient awake    Reviewed: Allergy & Precautions, H&P , NPO status , Patient's Chart, lab work & pertinent test results  Airway Mallampati: I TM Distance: >3 FB Neck ROM: Full    Dental  (+) Teeth Intact and Dental Advisory Given   Pulmonary  breath sounds clear to auscultation        Cardiovascular hypertension, Pt. on medications Rate:Normal     Neuro/Psych    GI/Hepatic   Endo/Other  diabetes, Well Controlled, Type 2, Oral Hypoglycemic AgentsMorbid obesity  Renal/GU      Musculoskeletal   Abdominal   Peds  Hematology   Anesthesia Other Findings   Reproductive/Obstetrics                           Anesthesia Physical Anesthesia Plan  ASA: III  Anesthesia Plan: General   Post-op Pain Management:    Induction: Intravenous  Airway Management Planned: LMA  Additional Equipment:   Intra-op Plan:   Post-operative Plan: Extubation in OR  Informed Consent: I have reviewed the patients History and Physical, chart, labs and discussed the procedure including the risks, benefits and alternatives for the proposed anesthesia with the patient or authorized representative who has indicated his/her understanding and acceptance.   Dental advisory given  Plan Discussed with: CRNA, Anesthesiologist and Surgeon  Anesthesia Plan Comments:         Anesthesia Quick Evaluation

## 2012-12-03 NOTE — Anesthesia Procedure Notes (Signed)
Procedure Name: LMA Insertion Date/Time: 12/03/2012 1:42 PM Performed by: Ezzard Standing, DAVID H Pre-anesthesia Checklist: Patient identified, Emergency Drugs available, Suction available and Patient being monitored Patient Re-evaluated:Patient Re-evaluated prior to inductionOxygen Delivery Method: Circle System Utilized Preoxygenation: Pre-oxygenation with 100% oxygen Intubation Type: IV induction Ventilation: Mask ventilation without difficulty LMA: LMA inserted LMA Size: 4.0 Number of attempts: 1 Airway Equipment and Method: bite block Placement Confirmation: positive ETCO2 and breath sounds checked- equal and bilateral Tube secured with: Tape Dental Injury: Teeth and Oropharynx as per pre-operative assessment

## 2012-12-03 NOTE — Transfer of Care (Signed)
Immediate Anesthesia Transfer of Care Note  Patient: Carrie Key  Procedure(s) Performed: Procedure(s): RIGHT SIMPLE MASTECTOMY WITH RIGHT  AXILLARY SENTINEL LYMPH NODE BIOPSY (Right)  Patient Location: PACU  Anesthesia Type:General  Level of Consciousness: awake, alert  and oriented  Airway & Oxygen Therapy: Patient Spontanous Breathing and Patient connected to face mask oxygen  Post-op Assessment: Report given to PACU RN, Post -op Vital signs reviewed and stable and Patient moving all extremities  Post vital signs: Reviewed and stable  Complications: No apparent anesthesia complications

## 2012-12-04 ENCOUNTER — Telehealth (INDEPENDENT_AMBULATORY_CARE_PROVIDER_SITE_OTHER): Payer: Self-pay

## 2012-12-04 ENCOUNTER — Encounter (HOSPITAL_BASED_OUTPATIENT_CLINIC_OR_DEPARTMENT_OTHER): Payer: Self-pay | Admitting: Surgery

## 2012-12-04 MED ORDER — HYDROCODONE-ACETAMINOPHEN 5-325 MG PO TABS: 1.0000 | ORAL_TABLET | Freq: Four times a day (QID) | ORAL | Status: DC | PRN

## 2012-12-04 MED ORDER — HYDROCODONE-ACETAMINOPHEN 5-325 MG PO TABS
ORAL_TABLET | ORAL | Status: AC
  Filled 2012-12-04: qty 2

## 2012-12-04 NOTE — Discharge Summary (Signed)
Physician Discharge Summary  Patient ID:  Carrie Key  MRN: [id number]  DOB/AGE: 71-May-1942 71 y.o.  Admit date: 12/03/2012 Discharge date: 12/04/2012  Discharge Diagnoses:  1. Right breast cancer, Tis, N0   DCIS on 3 separate biopsies, ER - 100%, PR - 100% (final pathology is pending)  MRI - 11/07/2012 - 13.2 x 3.2 x 3.9 cm area   Treating oncologist - Magrinat/Wentworth   2. Diabetes Mellitus - on oral hypoglycemics  3. Hypertension  4. GERD  5. Hypercholesterolemia  6. Umbilical hernia  Operation: Procedure(s): RIGHT SIMPLE MASTECTOMY WITH RIGHT  AXILLARY SENTINEL LYMPH NODE BIOPSY on 12/03/2012 - D. Ezzard Standing  Discharged Condition: good  Hospital Course: Carrie Key is an 72 y.o. female whose primary care physician is Alva Garnet., MD and who was admitted 12/03/2012 with a chief complaint of right breast cancer.   She was brought to the operating room on 12/03/2012 and underwent   RIGHT SIMPLE MASTECTOMY WITH RIGHT  AXILLARY SENTINEL LYMPH NODE BIOPSY.   She is now one day post op.  She has done well.  [name], her friend, is in the room.  She is ready for discharge. The discharge instructions were reviewed with the patient.  Consults: None  Significant Diagnostic Studies: Results for orders placed during the hospital encounter of 12/03/12  HEMOGLOBIN A1C      Result Value Range   Hemoglobin A1C 6.5 (*) <5.7 %   Mean Plasma Glucose 140 (*) <117 mg/dL  GLUCOSE, CAPILLARY      Result Value Range   Glucose-Capillary 87  70 - 99 mg/dL  GLUCOSE, CAPILLARY      Result Value Range   Glucose-Capillary 99  70 - 99 mg/dL  POCT HEMOGLOBIN-HEMACUE      Result Value Range   Hemoglobin 12.1  12.0 - 15.0 g/dL   Discharge Exam:  Filed Vitals:   12/04/12 0630  BP: 146/82  Pulse: 48  Temp: 98.7 F (37.1 C)  Resp: 18    General: WN AA F who is alert and generally healthy appearing.  Lungs: Clear to auscultation and symmetric breath sounds. Heart:  RRR. No  murmur or rub. Chest:  Dressing intact.  Drains 1/2 - 43/65 cc  Discharge Medications:     Medication List         amLODipine 10 MG tablet  Commonly known as:  NORVASC  Take 10 mg by mouth daily.     cloNIDine 0.3 MG tablet  Commonly known as:  CATAPRES  Take 0.3 mg by mouth 2 (two) times daily.     HYDROcodone-acetaminophen 5-325 MG per tablet  Commonly known as:  NORCO/VICODIN  Take 1-2 tablets by mouth every 6 (six) hours as needed for pain.     metFORMIN 500 MG tablet  Commonly known as:  GLUCOPHAGE  Take 500 mg by mouth daily with breakfast.     olmesartan-hydrochlorothiazide 40-25 MG per tablet  Commonly known as:  BENICAR HCT  Take 1 tablet by mouth daily.     omeprazole 40 MG capsule  Commonly known as:  PRILOSEC  Take 40 mg by mouth 2 (two) times daily.     rosuvastatin 10 MG tablet  Commonly known as:  CRESTOR  Take 10 mg by mouth daily.     traMADol 50 MG tablet  Commonly known as:  ULTRAM  Take 50 mg by mouth daily.        Disposition:       Discharge Orders   Future  Appointments Provider Department Dept Phone   12/17/2012 4:30 PM Lowella Dell, MD [facility] MEDICAL ONCOLOGY [phone]   Future Orders Complete By Expires   Diet - low sodium heart healthy  As directed    Increase activity slowly  As directed    NM Sentinel Node Inj-No Rpt (Breast)  As directed 01/14/2014   Questions:     Preferred imaging location?:  [facility]   Reason for Exam (SYMPTOM  OR DIAGNOSIS REQUIRED):  right breast cancer     Activity:  Driving - May drive in 3 or 4 days, if doing well.   Lifting - Take it easy for 5 days, then no limit.  Wound Care:   Leave bandage x 3 days, then may remove and shower.      Record the amount of drainage twice a day and write it down.  Bring that record to the office with you.  Diet:  As tolerated  Follow up appointment:  Call Dr. Allene Pyo office [facility]) and speak with Rivka Barbara (Dr.  Allene Pyo nurse) at [phone] for an appointment in 1 week.  Medications and dosages:  Resume your home medications.  You have a prescription for:  Vicodin.    Signed: Ovidio Kin, M.D., [facility] Office:  [phone]  12/04/2012, 7:40 AM

## 2012-12-04 NOTE — Telephone Encounter (Signed)
F/U call Patient states she is doing ok after surgery ,I informed her  Dr. Ezzard Standing is out of the office next week she will be seeing with Dr. Jamey Ripa 12-12-12 @ 140 Advised her to call if she has any concerns Patient verbalized understanding

## 2012-12-10 ENCOUNTER — Other Ambulatory Visit: Payer: Self-pay | Admitting: *Deleted

## 2012-12-10 ENCOUNTER — Other Ambulatory Visit (INDEPENDENT_AMBULATORY_CARE_PROVIDER_SITE_OTHER): Payer: Self-pay

## 2012-12-10 DIAGNOSIS — D0591 Unspecified type of carcinoma in situ of right breast: Secondary | ICD-10-CM

## 2012-12-11 ENCOUNTER — Other Ambulatory Visit: Payer: Self-pay | Admitting: *Deleted

## 2012-12-11 ENCOUNTER — Telehealth (INDEPENDENT_AMBULATORY_CARE_PROVIDER_SITE_OTHER): Payer: Self-pay

## 2012-12-11 DIAGNOSIS — C50411 Malignant neoplasm of upper-outer quadrant of right female breast: Secondary | ICD-10-CM

## 2012-12-11 MED ORDER — HYDROCODONE-ACETAMINOPHEN 5-325 MG PO TABS: 1.0000 | ORAL_TABLET | Freq: Four times a day (QID) | ORAL | Status: DC | PRN

## 2012-12-11 NOTE — Addendum Note (Signed)
Addended byEldridge Scot on: 12/11/2012 10:29 AM   Modules accepted: Orders

## 2012-12-11 NOTE — Telephone Encounter (Signed)
Patient is requesting refill for vicodin 5/325 advised her if rx is authorized she would need someone to drive her. Patient has appointment 12-12-12 With Dr Jamey Ripa .

## 2012-12-11 NOTE — Telephone Encounter (Signed)
Patient awre Dr. Maisie Fus authorized Vicodin  5/325 mg refill. She will have someone drive her P/U rx

## 2012-12-11 NOTE — Progress Notes (Signed)
Message received from Clydie Braun in [facility] stating need for referral for follow up with Dr Michell Heinrich needed to schedule appt.  This RN entered referral per above request.

## 2012-12-12 ENCOUNTER — Encounter: Payer: Self-pay | Admitting: Radiation Oncology

## 2012-12-12 ENCOUNTER — Encounter (INDEPENDENT_AMBULATORY_CARE_PROVIDER_SITE_OTHER): Payer: Self-pay | Admitting: Surgery

## 2012-12-12 ENCOUNTER — Ambulatory Visit (INDEPENDENT_AMBULATORY_CARE_PROVIDER_SITE_OTHER): Payer: Medicare Other | Admitting: Surgery

## 2012-12-12 VITALS — BP 142/88 | HR 64 | Resp 16 | Ht 69.0 in | Wt 237.2 lb

## 2012-12-12 DIAGNOSIS — Z09 Encounter for follow-up examination after completed treatment for conditions other than malignant neoplasm: Secondary | ICD-10-CM

## 2012-12-12 NOTE — Patient Instructions (Signed)
Come Back to see Dr. Ezzard Standing early next week

## 2012-12-12 NOTE — Progress Notes (Signed)
Location of Breast Cancer:Right Breast DCIS  Histology per Pathology Report:12/03/2012  mastectomy, Right - INVASIVE DUCTAL CARCINOMA, GRADE II OF III, SPANNING 9 CM. - EXTENSIVE DUCTAL CARCINOMA IN SITU, HIGH GRADE. - MICROCALCIFICATIONS WITHIN CARCINOMA. - LYMPHOVASCULAR INVASION PRESENT. - RESECTION MARGINS NEGATIVE FOR CARCINOMA. - BIOPSY SITE CHANGE. - SEE ONCOLOGY TABLE. 2. Lymph node, sentinel, biopsy, Right axillary #1 1 of 4 FINAL for JODIE, CAVEY ([id number]) Diagnosis(continued) - METASTATIC CARCINOMA IN ONE OF ONE LYMPH NODE, MEASURING 0.1 CM IN GREATEST DIMENSION (1/1). 3. Breast, excision, Right - BENIGN SKIN AND BREAST TISSUE.  Receptor Status: ER(+), PR (+), Her2-neu (-)  Patient presented to PCP that she found a lump in right breast which was found on mammography on 10/31/12 and further evaluated via ultrasound to measure 1.5 cm. Biopsy performed on 11/01/12 confirmed invasive ductal carcinoma, grade 1. 11/07/12 bilateral breast mri showed 13.2 cm  Past/Anticipated interventions by surgeon, if any:{Right simple mastectomy with right axillary sentinel lymph node biopsy on 12/03/12 by Dr.Newman  Past/Anticipated interventions by medical oncology, if any: Chemotherapy consider antiestrogen as prophylaxis.  Lymphedema issues, if any: No, patient still has in jp drain.  Pain issues, if NWG:NFAOZ axilla relieved with hydrocodone.  SAFETY ISSUES:  Prior radiation? No  Pacemaker/ICD? No  Possible current pregnancy?No  Is the patient on methotrexate?No  Current Complaints / other details:According to records patient's margines are negative for carcinoma and may not require radiation.Menses at age 82.First full-term birth age 63.Had 6 children , 2 are deceased.Took HRT x 2 years.Here with friend [name].    Tessa Lerner, RN 12/12/2012,11:17 AM

## 2012-12-12 NOTE — Progress Notes (Signed)
ECHO PROPP    [id number] 12/12/2012    Nov 17, 1940   CC:   Chief Complaint  Patient presents with  . Follow-up    rt maste     HPI:  The patient returns for post op follow-up. She underwent a right mastectomy with sentinel node evaluation on 12/03/12 by Dr. Ovidio Kin. Over all she feels that she is doing well.she is still uncomfortable. She brings back a report showing both drains to slow down, but the medial one has almost completely stopped.  PE: VITAL SIGNS: BP 142/88  Pulse 64  Resp 16  Ht 5\' 9"  (1.753 m)  Wt 237 lb 3.2 oz (107.593 kg)  BMI 35.01 kg/m2  Breast: The incision is healing nicely and there is no evidence of infection or hematoma.  The drains are showing clear fluid.Marland Kitchen  DATA REVIEWED: Pathology report: Diagnosis 1. Breast, simple mastectomy, Right - INVASIVE DUCTAL CARCINOMA, GRADE II OF III, SPANNING 9 CM. - EXTENSIVE DUCTAL CARCINOMA IN SITU, HIGH GRADE. - MICROCALCIFICATIONS WITHIN CARCINOMA. - LYMPHOVASCULAR INVASION PRESENT. - RESECTION MARGINS NEGATIVE FOR CARCINOMA. - BIOPSY SITE CHANGE. - SEE ONCOLOGY TABLE. 2. Lymph node, sentinel, biopsy, Right axillary #1 1 of 4 FINAL for Carrie Key, Carrie Key ([id number]) Diagnosis(continued) - METASTATIC CARCINOMA IN ONE OF ONE LYMPH NODE, MEASURING 0.1 CM IN GREATEST DIMENSION (1/1). 3. Breast, excision, Right - BENIGN SKIN AND BREAST TISSUE.  IMPRESSION: Patient doing well. The medial drain is ready to be removed  PLAN: Her next visit will be in one week to see Dr. Ezzard Standing.I removed the medial drain and applied sterile dressings.I gave the patient a copy of the pathology report and reviewed it with her

## 2012-12-13 ENCOUNTER — Encounter: Payer: Self-pay | Admitting: *Deleted

## 2012-12-13 ENCOUNTER — Ambulatory Visit
Admission: RE | Admit: 2012-12-13 | Discharge: 2012-12-13 | Disposition: A | Discharge status: Home / Self Care | Payer: Medicare Other | Source: Ambulatory Visit | Attending: Radiation Oncology | Admitting: Radiation Oncology

## 2012-12-13 ENCOUNTER — Encounter: Payer: Self-pay | Admitting: Radiation Oncology

## 2012-12-13 ENCOUNTER — Other Ambulatory Visit: Payer: Self-pay | Admitting: Oncology

## 2012-12-13 VITALS — BP 152/81 | HR 57 | Temp 98.4°F | Wt 238.2 lb

## 2012-12-13 DIAGNOSIS — Z87891 Personal history of nicotine dependence: Secondary | ICD-10-CM | POA: Insufficient documentation

## 2012-12-13 DIAGNOSIS — K219 Gastro-esophageal reflux disease without esophagitis: Secondary | ICD-10-CM | POA: Insufficient documentation

## 2012-12-13 DIAGNOSIS — E785 Hyperlipidemia, unspecified: Secondary | ICD-10-CM | POA: Insufficient documentation

## 2012-12-13 DIAGNOSIS — C773 Secondary and unspecified malignant neoplasm of axilla and upper limb lymph nodes: Secondary | ICD-10-CM | POA: Insufficient documentation

## 2012-12-13 DIAGNOSIS — C50411 Malignant neoplasm of upper-outer quadrant of right female breast: Secondary | ICD-10-CM

## 2012-12-13 DIAGNOSIS — C50919 Malignant neoplasm of unspecified site of unspecified female breast: Secondary | ICD-10-CM | POA: Insufficient documentation

## 2012-12-13 DIAGNOSIS — E119 Type 2 diabetes mellitus without complications: Secondary | ICD-10-CM | POA: Insufficient documentation

## 2012-12-13 DIAGNOSIS — Z9010 Acquired absence of unspecified breast and nipple: Secondary | ICD-10-CM | POA: Insufficient documentation

## 2012-12-13 DIAGNOSIS — I10 Essential (primary) hypertension: Secondary | ICD-10-CM | POA: Insufficient documentation

## 2012-12-13 NOTE — Progress Notes (Signed)
Per Dr. Darnelle Catalan Oncotype Dx ordered.  Sent to pathology.  Received by Beverely Low in pathology.

## 2012-12-13 NOTE — Progress Notes (Signed)
Radiation Oncology         [phone]) [phone] ________________________________  Initial outpatient Consultation - Date: 12/13/2012   Name: Carrie Key MRN: [id number]   DOB: [birth year]  REFERRING PHYSICIAN: Magrinat, Valentino Hue, MD  DIAGNOSIS:  1. Breast cancer of upper-outer quadrant of right female breast     HISTORY OF PRESENT ILLNESS::Carrie Key is a 72 y.o. female  who palpated a right breast mass. A mammogram was performed which confirmed a 1.6 cm asked in the upper outer quadrant of the right breast. A biopsy on 11/01/2012 showed ductal carcinoma in situ involving a papilloma. which was ER/PR +100%.Marland Kitchen MRI of the bilateral breast showed a extensive area of non-mass enhancement involving the central and lateral right breast measuring 13.2 x 3.2 x 3.9 cm. No abnormal appearing lymph nodes were negative. There was no abnormality of the left breast. Due to these imaging findings a mastectomy was recommended and she had that performed on 12/03/2012. This showed a grade 2 invasive ductal carcinoma as measuring 9 cm with extensive high-grade ductal carcinoma in situ. Echo calcifications were noted as well as lymphovascular invasion. The resection margins were negative for carcinoma. A 1 mm deposit was noted in 1 out of one right sentinel lymph node. The margins were negative. She is feeling well since her surgery. She still has one drain in. She is scheduled to see Dr. Ezzard Standing next week. She is scheduled to see medical oncology on Monday. She has no personal history of breast cancer. She is accompanied by her friend today. He had menarche at age 53 with her first live birth at 30. She is G6 P6 and stopped having periods in 1990 when she had a total abdominal hysterectomy. She never took hormone replacement.  PREVIOUS RADIATION THERAPY: No  PAST MEDICAL HISTORY:  has a past medical history of Breast cancer; Diabetes mellitus without complication; Hypertension; GERD (gastroesophageal reflux disease);  Hyperlipemia; and Wears glasses.    PAST SURGICAL HISTORY: Past Surgical History  Procedure Laterality Date  . Abdominal hysterectomy    . Colonoscopy    . Upper gi endoscopy    . Back surgery  2002    lumbar lam  . Cardiac catheterization  2003  . Simple mastectomy with axillary sentinel node biopsy Right 12/03/2012    Procedure: RIGHT SIMPLE MASTECTOMY WITH RIGHT  AXILLARY SENTINEL LYMPH NODE BIOPSY;  Surgeon: Kandis Cocking, MD;  Location: [facility];  Service: General;  Laterality: Right;    FAMILY HISTORY: No family history on file.  SOCIAL HISTORY:  History  Substance Use Topics  . Smoking status: Former Smoker -- 0.30 packs/day for 30 years    Types: Cigarettes    Quit date: 11/26/1988  . Smokeless tobacco: Never Used  . Alcohol Use: 2.4 oz/week    4 Glasses of wine per week     Comment: occ    ALLERGIES: Review of patient's allergies indicates no known allergies.  MEDICATIONS:  Current Outpatient Prescriptions  Medication Sig Dispense Refill  . amLODipine (NORVASC) 10 MG tablet Take 10 mg by mouth daily.      . cloNIDine (CATAPRES) 0.3 MG tablet Take 0.3 mg by mouth 2 (two) times daily.      Marland Kitchen HYDROcodone-acetaminophen (NORCO/VICODIN) 5-325 MG per tablet Take 1-2 tablets by mouth every 6 (six) hours as needed.  30 tablet  0  . metFORMIN (GLUCOPHAGE) 500 MG tablet Take 500 mg by mouth daily with breakfast.      . olmesartan-hydrochlorothiazide (  BENICAR HCT) 40-25 MG per tablet Take 1 tablet by mouth daily.      Marland Kitchen omeprazole (PRILOSEC) 40 MG capsule Take 40 mg by mouth 2 (two) times daily.      . rosuvastatin (CRESTOR) 10 MG tablet Take 10 mg by mouth daily.       No current facility-administered medications for this encounter.    REVIEW OF SYSTEMS:  A 15 point review of systems is documented in the electronic medical record. This was obtained by the nursing staff. However, I reviewed this with the patient to discuss relevant findings and make  appropriate changes.  Pertinent items are noted in HPI.  PHYSICAL EXAM:  Filed Vitals:   12/13/12 0815  BP: 152/81  Pulse: 57  Temp: 98.4 F (36.9 C)  .238 lb 3.2 oz (108.047 kg). He is a pleasant female in no distress sitting comfortably on examining table. Her mastectomy incision is clean dry and intact with no signs of infection. Steri-Strips are in place. She has no lymphedema of the right arm. She is alert and oriented x3.  LABORATORY DATA:  Lab Results  Component Value Date   WBC 6.5 11/14/2012   HGB 12.1 12/03/2012   HCT 34.5* 11/14/2012   MCV 90.2 11/14/2012   PLT 240 11/14/2012   Lab Results  Component Value Date   NA 140 11/14/2012   K 4.0 11/14/2012   CL 102 05/21/2008   CO2 25 11/14/2012   Lab Results  Component Value Date   ALT 10 11/14/2012   AST 17 11/14/2012   ALKPHOS 62 11/14/2012   BILITOT 0.42 11/14/2012     RADIOGRAPHY: Nm Sentinel Node Inj-no Rpt (breast)  12/03/2012   CLINICAL DATA: RT BREAST INJECTION   Sulfur colloid was injected intradermally by the nuclear medicine  technologist for breast cancer sentinel node localization.       IMPRESSION: T3 N1 invasive ductal carcinoma of the right breast status post mastectomy  PLAN: Unfortunately despite 3 biopsies Carrie Key came up with 9 cm of invasive disease and a micrometastatic the lymph node. For this reason I have recommended postmastectomy radiation. I will treat the chest wall as well as the supraclavicular fossa and add a posterior axillary boost. We discussed the improvement in survival and decrease in local failure in patients who undergo postmastectomy radiation. We discussed that radiation would occur after chemotherapy if indeed she was a candidate for chemotherapy. She is meeting with Dr. Darnelle Catalan on Monday to discuss this further. We discussed the process of simulation  and the placement tattoos. We discussed 6 weeks of treatment as an outpatient. We discussed the acute effects of treatment which are  fatigue and skin darkening. We discussed that she may have complications with reconstruction but she is not interested in reconstruction at this time. We discussed the low rate of symptomatic lung damage. She will meet with medical oncology. Obviously if she needs chemotherapy she would have that first followed by radiation. I will also leave the decision as far as staging goes to medical oncology as well.  I spent 40 minutes  face to face with the patient and more than 50% of that time was spent in counseling and/or coordination of care.   ------------------------------------------------  Lurline Hare, MD

## 2012-12-13 NOTE — Progress Notes (Signed)
Please see the Nurse Progress Note in the MD Initial Consult Encounter for this patient. 

## 2012-12-14 NOTE — Addendum Note (Signed)
Encounter addended by: Janesa Dockery Mintz Damon Baisch, RN on: 12/14/2012  7:26 PM<BR>     Documentation filed: Charges VN

## 2012-12-14 NOTE — Addendum Note (Signed)
Encounter addended by: Delynn Flavin, RN on: 12/14/2012  6:25 PM<BR>     Documentation filed: Charges VN

## 2012-12-17 ENCOUNTER — Ambulatory Visit (HOSPITAL_BASED_OUTPATIENT_CLINIC_OR_DEPARTMENT_OTHER): Payer: Medicare Other | Admitting: Oncology

## 2012-12-17 VITALS — BP 176/84 | HR 76 | Temp 97.6°F | Resp 20 | Ht 69.0 in | Wt 236.3 lb

## 2012-12-17 DIAGNOSIS — Z170 Estrogen receptor positive status [ER+]: Secondary | ICD-10-CM

## 2012-12-17 DIAGNOSIS — C50411 Malignant neoplasm of upper-outer quadrant of right female breast: Secondary | ICD-10-CM

## 2012-12-17 DIAGNOSIS — C50419 Malignant neoplasm of upper-outer quadrant of unspecified female breast: Secondary | ICD-10-CM

## 2012-12-17 DIAGNOSIS — M549 Dorsalgia, unspecified: Secondary | ICD-10-CM

## 2012-12-17 MED ORDER — HYDROCODONE-ACETAMINOPHEN 5-325 MG PO TABS: 2.0000 | ORAL_TABLET | Freq: Four times a day (QID) | ORAL | Status: DC | PRN

## 2012-12-17 NOTE — Progress Notes (Signed)
ID: Carrie Key OB: October 05, 1940  MR#: [id number]  WUJ#:[id number]  PCP: Alva Garnet., MD GYN:   SUOvidio Kin OTHER MD: Lurline Hare, Robert Buccini  CHIEF COMPLAINT: "I found a lump in my breast".  HISTORY OF PRESENT ILLNESS: Carrie Key noted a lump in the upper-outer quadrant of her right breast and brought it to the attention of her primary care physician. She was set up for bilateral diagnostic mammography 10/31/2012 at [facility]. This showed her breast to be category A., almost entirely fatty. A 1.6 cm irregular high density mass with indistinct margins was noted in the right upper outer quadrant. This was palpable. Ultrasound of the right breast showed the mass to measure 1.5 cm, and to be lobulated. There was no axillary abnormality by ultrasound. Biopsy of the mass in question 11/01/2012 showed ([id number]) invasive ductal carcinoma, grade 1, estrogen and progesterone receptors both 100% positive. Biopsies obtained lateral and medial to this mass on the same day showed an identical morphology.  On 11/07/2012 the patient underwent bilateral breast MRI, which showed a total area of involvement measuring 13.2 cm, extending from the central to the upper lateral right breast. There were no abnormal appearing lymph nodes in the left breast was unremarkable.  Her subsequent history is as detailed below.   INTERVAL HISTORY: Carrie Key returns today for followup of her breast cancer. Since her last visit here she underwent right mastectomy and sentinel lymph node sampling 12/03/2012. The final pathology ([id number]) showed a 9 cm area of invasive ductal carcinoma, grade 2, involving the single sentinel lymph node with a micrometastatic deposit (1 mm). There was extensive ductal carcinoma in situ as well. Margins were ample. The invasive tumor was estrogen receptor positive at 99%, progesterone receptor positive at 96%, with an MIB-1 of 9% and no HER-2 amplification.  REVIEW OF SYSTEMS: The  patient had no significant bleeding, swelling, or fever related to her surgery. She still has significant pain. She was taking hydrocodone 2 tablets every 6 hours with some relief, but when the pain medication was dropped to 1 tablet every 6 hours her pain has been not well controlled. She is also moderately constipated from the pain medicine, with a hard bowel movement every 2 days. She is sleeping poorly, she complains of pain in her back and some difficulty walking (she is using a cane today), and she is having hot flashes. She tells me her blood sugars are well-controlled. A detailed review of systems today was otherwise stable  PAST MEDICAL HISTORY: Past Medical History  Diagnosis Date  . Breast cancer   . Diabetes mellitus without complication   . Hypertension   . GERD (gastroesophageal reflux disease)   . Hyperlipemia   . Wears glasses     PAST SURGICAL HISTORY: Past Surgical History  Procedure Laterality Date  . Abdominal hysterectomy    . Colonoscopy    . Upper gi endoscopy    . Back surgery  2002    lumbar lam  . Cardiac catheterization  2003  . Simple mastectomy with axillary sentinel node biopsy Right 12/03/2012    Procedure: RIGHT SIMPLE MASTECTOMY WITH RIGHT  AXILLARY SENTINEL LYMPH NODE BIOPSY;  Surgeon: Kandis Cocking, MD;  Location: [facility];  Service: General;  Laterality: Right;    FAMILY HISTORY No family history on file. The patient's father died at the age of 46 from a myocardial infarction. The patient's mother died at the age of 78 from a stroke. The patient has 5  brothers, 4 sisters. There is no history of breast or ovarian cancer in the family  GYNECOLOGIC HISTORY:  Menarche age 68, first live birth age 24. The patient is GX P6. She stopped having periods in 1990. She is status post total abdominal hysterectomy with bilateral salpingo-oophorectomy. She never took hormone replacement.  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 She lives by herself, with no pets, although her friend and significant other, Carrie Key, visits daily. She has one son who died from a stroke at age 67. Son Carrie Key worked for [employer] in [place]. Daughter Carrie Key works in a factory in [place]. Son Carrie Key lives in [place], and is currently unemployed. Son Carrie Key lives in [place] and works [place] the airport. The patient has 11 grandchildren. She attends [employer]    ADVANCED DIRECTIVES: Not in place   HEALTH MAINTENANCE: History  Substance Use Topics  . Smoking status: Former Smoker -- 0.30 packs/day for 30 years    Types: Cigarettes    Quit date: 11/26/1988  . Smokeless tobacco: Never Used  . Alcohol Use: 2.4 oz/week    4 Glasses of wine per week     Comment: occ     Colonoscopy: 2013  PAP: Status post hysterectomy  Bone density: -  Lipid panel:  No Known Allergies  Current Outpatient Prescriptions  Medication Sig Dispense Refill  . amLODipine (NORVASC) 10 MG tablet Take 10 mg by mouth daily.      . cloNIDine (CATAPRES) 0.3 MG tablet Take 0.3 mg by mouth 2 (two) times daily.      Marland Kitchen HYDROcodone-acetaminophen (NORCO/VICODIN) 5-325 MG per tablet Take 1-2 tablets by mouth every 6 (six) hours as needed.  30 tablet  0  . metFORMIN (GLUCOPHAGE) 500 MG tablet Take 500 mg by mouth daily with breakfast.      . olmesartan-hydrochlorothiazide (BENICAR HCT) 40-25 MG per tablet Take 1 tablet by mouth daily.      Marland Kitchen omeprazole (PRILOSEC) 40 MG capsule Take 40 mg by mouth 2 (two) times daily.      . rosuvastatin (CRESTOR) 10 MG tablet Take 10 mg by mouth daily.       No current facility-administered medications for this visit.    OBJECTIVE: Older African American woman in moderate distress Filed Vitals:   12/17/12 1630  BP: 176/84  Pulse: 76  Temp: 97.6 F (36.4 C)  Resp: 20     Body mass index is 34.88 kg/(m^2).    ECOG FS:1 - Symptomatic but completely  ambulatory  Ocular: Sclerae unicteric, pupils equal, round and reactive to light Ear-nose-throat: Oropharynx clear Lymphatic: No cervical or supraclavicular adenopathy Lungs no rales or rhonchi, fair excursion bilaterally Heart regular rate and rhythm Abd soft, nontender, positive bowel sounds MSK no focal spinal tenderness including palpation of the lower spine, no right upper extremity lymphedema Neuro: non-focal, well-oriented, anxious affect Breasts: The right breast is status post mastectomy. The incision still has Steri-Strips in place. There is no dehiscence, erythema, or unusual swelling. There is mild tenderness to palpation particularly in the axilla, which is otherwise unremarkable. One Jackson-Pratt drain still in place, with 10 cc of serosanguineous fluid in the bulb. The right axilla is benign. The left breast is unremarkable   LAB RESULTS:  CMP     Component Value Date/Time   NA 140 11/14/2012 0832   NA 139 05/21/2008 0424   K 4.0 11/14/2012 0832   K 3.8 05/21/2008  0424   CL 102 05/21/2008 0424   CO2 25 11/14/2012 0832   GLUCOSE 118 11/14/2012 0832   GLUCOSE 121* 05/21/2008 0424   BUN 19.2 11/14/2012 0832   BUN 20 05/21/2008 0424   CREATININE 1.3* 11/14/2012 0832   CREATININE 1.2 05/21/2008 0424   CALCIUM 10.1 11/14/2012 0832   PROT 7.8 11/14/2012 0832   ALBUMIN 3.6 11/14/2012 0832   AST 17 11/14/2012 0832   ALT 10 11/14/2012 0832   ALKPHOS 62 11/14/2012 0832   BILITOT 0.42 11/14/2012 0832    I No results found for this basename: SPEP,  UPEP,   kappa and lambda light chains    Lab Results  Component Value Date   WBC 6.5 11/14/2012   NEUTROABS 4.3 11/14/2012   HGB 12.1 12/03/2012   HCT 34.5* 11/14/2012   MCV 90.2 11/14/2012   PLT 240 11/14/2012      Chemistry      Component Value Date/Time   NA 140 11/14/2012 0832   NA 139 05/21/2008 0424   K 4.0 11/14/2012 0832   K 3.8 05/21/2008 0424   CL 102 05/21/2008 0424   CO2 25 11/14/2012 0832   BUN 19.2 11/14/2012 0832   BUN 20  05/21/2008 0424   CREATININE 1.3* 11/14/2012 0832   CREATININE 1.2 05/21/2008 0424      Component Value Date/Time   CALCIUM 10.1 11/14/2012 0832   ALKPHOS 62 11/14/2012 0832   AST 17 11/14/2012 0832   ALT 10 11/14/2012 0832   BILITOT 0.42 11/14/2012 0832       No results found for this basename: LABCA2    No components found with this basename: LABCA125    No results found for this basename: INR,  in the last 168 hours  Urinalysis    Component Value Date/Time   COLORURINE YELLOW 05/21/2008 0410   APPEARANCEUR CLEAR 05/21/2008 0410   LABSPEC 1.010 05/21/2008 0410   PHURINE 7.0 05/21/2008 0410   GLUCOSEU NEGATIVE 05/21/2008 0410   HGBUR NEGATIVE 05/21/2008 0410   BILIRUBINUR NEGATIVE 05/21/2008 0410   KETONESUR NEGATIVE 05/21/2008 0410   PROTEINUR NEGATIVE 05/21/2008 0410   UROBILINOGEN 0.2 05/21/2008 0410   NITRITE NEGATIVE 05/21/2008 0410   LEUKOCYTESUR LARGE* 05/21/2008 0410    STUDIES: Mr Breast Bilateral W Wo Contrast  11/07/2012   CLINICAL DATA:  Patient with newly diagnosed DCIS involving papilloma.  EXAM: BILATERAL BREAST MRI WITH AND WITHOUT CONTRAST  LABS:  BUN and creatinine were obtained on site at [facility] at [address]Results: BUN 14 mg/dL, Creatinine 1.3 mg/dL.  TECHNIQUE: Multiplanar, multisequence MR images of both breasts were obtained prior to and following the intravenous administration of 20ml of MultiHance.  THREE-DIMENSIONAL MR IMAGE RENDERING ON INDEPENDENT WORKSTATION:  Three-dimensional MR images were rendered by post-processing of the original MR data on an independent workstation. The three-dimensional MR images were interpreted, and findings are reported in the following complete MRI report for this study.  COMPARISON:  Previous exams  FINDINGS: Breast composition: b. Scattered fibroglandular tissue  Background parenchymal enhancement: Mild  Right breast: 3 biopsy sites are identified in the central lateral right breast, with susceptibility artifact  indicating clip placement. Extensive non mass enhancement is seen involving the central to upper lateral right breast. The area of involvement of non mass enhancement measures 13.2 cm AP x 3.2 cm CC x 3.9 cm TR. The non mass enhancement extends 2 cm anterior to the anterior biopsy site and 1.5 cm posterior to the posterior biopsy site.  No additional discrete masses are identified. Biopsy site of the previously sampled mass is located centrally within the large area of non mass enhancement.  Left breast: No mass or abnormal enhancement.  Lymph nodes: No abnormal appearing lymph nodes.  Ancillary findings:  None.  IMPRESSION: 13.2 x 3.2 x 3.9 cm area of non mass enhancement in the central to upper lateral right breast, correlating with the previously biopsied area of DCIS involving a papilloma. This correlates with calcifications mammographically. The non mass enhancement extends 2 cm anterior to the anterior biopsy site and 1.5 cm posterior to the posterior biopsy site. No additional sites of abnormal enhancement. Negative left breast.  RECOMMENDATION: Treatment planning.  BI-RADS CATEGORY  6: Known biopsy-proven malignancy - appropriate action should be taken.   Electronically Signed   By: Jerene Dilling M.D.   On: 11/07/2012 11:40    ASSESSMENT: 72 y.o. August woman status post right breast biopsy at 3 separate sites in the right breast, all 3 sites showing ductal carcinoma in situ, low-grade, estrogen and progesterone receptors both 100% positive  Status post right mastectomy and sentinel lymph node sampling 12/03/2012 for a pT3 pN1, stage IIIA invasive ductal carcinoma, estrogen receptor 99% positive, progesterone receptor 96% positive, with an MIB-1 of 9% and no HER-2 amplification.  PLAN: Arnitra's situation turns out to be very different from what we anticipated and we spent the better part of today's 45 minute meeting making sure she understood her diagnosis. She understands she now has an  invasive, is supposed to in situ disease. With stage III, there is a significant risk of metastatic spread, and she will need staging studies, which have been scheduled for next week. In addition, chemotherapy a standard of care for stage III breast cancer. She also will need postmastectomy radiation. All that will be followed by antiestrogen therapy.  We do have a study currently through which patient's like her, if they have an Oncotype score of 25 or less are randomly assigned to chemotherapy your no chemotherapy. When her case was discussed at the multidisciplinary breast cancer conference 12/12/2012 the consensus was that she would be a good candidate for this study. I have strongly encouraged her to participate in the study. If she does not participate then of course chemotherapy will be part of her treatment. If she does participate she has a 50% chance of not receiving chemotherapy. Which chemotherapy she receives either way is up to her knee, as the study does not prescribe a specific regimen.  She is very interested in this possibility and we have sent off an Oncotype, with results expected approximately November 17. She will see me again November 27 to discuss results.  In the meantime I have refilled her hydrocodone and given her 112 tablets so she can take 2 tablets 4 times a day for the next 2 weeks if she needs to. I have suggested she get some Aleve and take one hydrocodone and 1 Aleve 4 times a day instead of 2 hydrocodone. I think she would do better with that combination. Finally she is a ready constipated from the narcotics so I suggested she take 2 stool softeners in the morning and if necessary 2 in the evening to make sure she does not have hard bowel movements.  She knows to call for any problems that may develop before her next visit here. Lowella Dell, MD   12/17/2012 4:52 PM

## 2012-12-18 ENCOUNTER — Encounter: Payer: Self-pay | Admitting: *Deleted

## 2012-12-19 ENCOUNTER — Telehealth: Payer: Self-pay | Admitting: Oncology

## 2012-12-19 NOTE — Telephone Encounter (Signed)
s.w. pt and advised on NOV appt....pt ok and aware °

## 2012-12-20 ENCOUNTER — Encounter (INDEPENDENT_AMBULATORY_CARE_PROVIDER_SITE_OTHER): Payer: Self-pay

## 2012-12-20 ENCOUNTER — Encounter (HOSPITAL_COMMUNITY): Payer: Self-pay

## 2012-12-20 ENCOUNTER — Encounter (INDEPENDENT_AMBULATORY_CARE_PROVIDER_SITE_OTHER): Payer: Self-pay | Admitting: Surgery

## 2012-12-20 ENCOUNTER — Encounter: Payer: Self-pay | Admitting: *Deleted

## 2012-12-20 ENCOUNTER — Other Ambulatory Visit (INDEPENDENT_AMBULATORY_CARE_PROVIDER_SITE_OTHER): Payer: Self-pay

## 2012-12-20 ENCOUNTER — Ambulatory Visit (INDEPENDENT_AMBULATORY_CARE_PROVIDER_SITE_OTHER): Payer: Medicare Other | Admitting: Surgery

## 2012-12-20 VITALS — BP 142/90 | HR 94 | Temp 98.0°F | Resp 18 | Ht 67.0 in | Wt 234.0 lb

## 2012-12-20 DIAGNOSIS — C50411 Malignant neoplasm of upper-outer quadrant of right female breast: Secondary | ICD-10-CM

## 2012-12-20 DIAGNOSIS — C50419 Malignant neoplasm of upper-outer quadrant of unspecified female breast: Secondary | ICD-10-CM

## 2012-12-20 NOTE — Progress Notes (Signed)
Received Oncotype Dx results of 6.  Gave MD a copy.  Emailed results to MD.  Rochele Pages copy to med rec to scan.

## 2012-12-20 NOTE — Patient Instructions (Signed)
      ABC CLASS After Breast Cancer Class  After Breast Cancer Class is a specially designed exercise class to assist you in a safe recovery after having breast cancer surgery.  In this class you will learn how to get back to full function whether your drains were just removed or if you had surgery a month ago.  This one-time class is held the 1st and 3rd Monday of every month from 11:00 a.m. until 12:00 noon at the [facility] at [address]  This class is FREE and space is limited.  For more information or to register for the next available class, call [phone].  Class Goals   Understand specific stretches to improve the flexibility of your chest and shoulder.   Learn ways to safely strengthen your upper body and improve your posture.   Understand the warning signs of infection and why you may be at risk for an arm infection.   Learn about Lymphedema and prevention.  **You do not attend this class until after surgery.  Drains must be removed to participate.     Donna Salisbury, PT, CLT Marti Smith, PT, CLT        

## 2012-12-20 NOTE — Progress Notes (Addendum)
Re:   Carrie Key DOB:   May 16, 1940 MRN:   [id number]  [facility]  ASSESSMENT AND PLAN: 1.  Right breast cancer, T3, N1  Right mastectomy with SLNBx - 12/03/2012 - D. Margorie Renner  Final path - invasive ductal cancer, grade II of III, spans 9 cm, 1/1 node  ER - 99%, PR - 96%, Ki67 - 9%, Her2 Neu - neg  Treating oncologist - Magrinat/Wentworth  Plan: 1) send off Oncotype - this will dictate chemo or not, 2) post mastectomy radiation tx  I discussed possible power port.  I discussed the risks, which include bleeding, infection, pneumothorax, and nerve injury.  This would only be placed with plans for chemothx - which is yet to be determined.  I will see her back in 4 to 6 weeks.  [Oncotype 6, Recurrence rate - 8%.  DN 01/07/2013]   2.  Diabetes Mellitus - on oral hypoglycemics 3.  Hypertension 4.  GERD 5.  Hypercholesterolemia 6.  Umbilical hernia  Chief Complaint  Patient presents with  . Routine Post Op    rt breast    REFERRING PHYSICIAN: Alva Garnet., MD  HISTORY OF PRESENT ILLNESS: Carrie Key is a 72 y.o. (DOB: [birth year])  AA  female whose primary care physician is Alva Garnet., MD and comes for follow up of a right mastectomy. [name], a friend, is with her. Unfortunately, her final path was worse that expected going in.  She is going to get an Oncotype to decide on chemotx.  And Dr. Darnelle Catalan is doing a met workup. I'll see her back in 4-6 weeks to see where she is. She needs to work on her right arm.  History of Breast Cancer (11/2012): She has no family history of breast cancer.  She is not on hormone meds. She has had a hysterectomy. The patient's last mammogram was 05/07/2010.   She had recent mammograms on 10/31/2012 at [facility] that showed a 1.6 cm area that was suspicious for cancer. Biopsy x 3 on 11/02/2102 (Accession: [id number] showed DCIS involving a papilloma x 3. She had a MRI 11/07/2012 that showed:  a 13.2 x 3.2 x 3.9 cm area of non mass  enhancement in the central to upper lateral right breast, correlating with the previously biopsied area of DCIS involving a papilloma. This correlates with calcifications mammographically. The non mass enhancement extends 2 cm anterior to the anterior biopsy site and 1.5 cm posterior to the posterior biopsy site.   Past Medical History  Diagnosis Date  . Breast cancer   . Diabetes mellitus without complication   . Hypertension   . GERD (gastroesophageal reflux disease)   . Hyperlipemia   . Wears glasses    Past Surgical History  Procedure Laterality Date  . Abdominal hysterectomy    . Colonoscopy    . Upper gi endoscopy    . Back surgery  2002    lumbar lam  . Cardiac catheterization  2003  . Simple mastectomy with axillary sentinel node biopsy Right 12/03/2012    Procedure: RIGHT SIMPLE MASTECTOMY WITH RIGHT  AXILLARY SENTINEL LYMPH NODE BIOPSY;  Surgeon: Kandis Cocking, MD;  Location: [facility];  Service: General;  Laterality: Right;    Current Outpatient Prescriptions  Medication Sig Dispense Refill  . amLODipine (NORVASC) 10 MG tablet Take 10 mg by mouth daily.      . cloNIDine (CATAPRES) 0.3 MG tablet Take 0.3 mg by mouth 2 (two) times daily.      Marland Kitchen  docusate sodium (COLACE) 100 MG capsule Take 200 mg by mouth 2 (two) times daily as needed for mild constipation.      Marland Kitchen HYDROcodone-acetaminophen (NORCO/VICODIN) 5-325 MG per tablet Take 2 tablets by mouth every 6 (six) hours as needed for severe pain.  112 tablet  0  . metFORMIN (GLUCOPHAGE) 500 MG tablet Take 500 mg by mouth daily with breakfast.      . naproxen sodium (ANAPROX) 220 MG tablet Take 220 mg by mouth 2 (two) times daily as needed.      Marland Kitchen olmesartan-hydrochlorothiazide (BENICAR HCT) 40-25 MG per tablet Take 1 tablet by mouth daily.      Marland Kitchen omeprazole (PRILOSEC) 40 MG capsule Take 40 mg by mouth 2 (two) times daily.      . rosuvastatin (CRESTOR) 10 MG tablet Take 10 mg by mouth daily.       No current  facility-administered medications for this visit.     No Known Allergies  REVIEW OF SYSTEMS: Cardiac:  Hypertension since 1994.  Seen by cardiologist remotely at [facility] cardiology. Endocrine:  Diabetes mellitus since 2012. No thyroid disease.  Hypercholesterolemia. Gastrointestinal:  No history of stomach disease.  No history of liver disease.  No history of gall bladder disease.  No history of pancreas disease.  No history of colon disease.  Right sided abdominal pain, maybe better recently. Urologic:  No history of kidney stones.  No history of bladder infections. Musculoskeletal:  Right knee pain - on Tramadol.  Back surgery 1997 - has done well from this.  SOCIAL and FAMILY HISTORY: Widowed. 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 [name], a friend, is with her. She has 4 living children.  3 boys and 1 girl, ages 70 to 66.  Her daughter lives in [place]. She is planning a family trip to [facility] next week.  PHYSICAL EXAM: BP 142/90  Pulse 94  Temp(Src) 98 F (36.7 C)  Resp 18  Ht 5\' 7"  (1.702 m)  Wt 234 lb (106.142 kg)  BMI 36.64 kg/m2  General: AA F who is alert and generally healthy appearing.  HEENT: Normal. Pupils equal. Neck: Supple. No mass.  No thyroid mass. Lymph Nodes:  No supraclavicular, cervical, or axillary nodes. Breasts:  Right:  Right mastectomy incision looks good.  There is minimal drainage.  I removed the remaining drain.  I also showed the patient exercises for her right arm.  Left - No mass  DATA REVIEWED: Path to patient.  Ovidio Kin, MD,  [facility] [address] [place]  [address]   [place], [place]    [address] Phone:  [phone] FAX:  [phone]

## 2012-12-24 ENCOUNTER — Encounter: Payer: Self-pay | Admitting: *Deleted

## 2012-12-24 NOTE — Progress Notes (Signed)
Mailed after appt letter to pt. 

## 2012-12-25 ENCOUNTER — Other Ambulatory Visit: Payer: Self-pay | Admitting: *Deleted

## 2012-12-25 ENCOUNTER — Telehealth: Payer: Self-pay | Admitting: Oncology

## 2012-12-25 NOTE — Telephone Encounter (Signed)
per 11/18 pof moved 11/21 lb to 11/19 before scans and no lb needed for 11/21 f/u. s/w pt she is aware of lb 11/19 @ 1pm and that on 11/21 she will see GM only. pt will get new schedule tomorrow,

## 2012-12-26 ENCOUNTER — Encounter (HOSPITAL_COMMUNITY): Payer: Self-pay

## 2012-12-26 ENCOUNTER — Encounter (HOSPITAL_COMMUNITY)
Admission: RE | Admit: 2012-12-26 | Discharge: 2012-12-26 | Disposition: A | Discharge status: Home / Self Care | Payer: Medicare Other | Source: Ambulatory Visit | Attending: Oncology | Admitting: Oncology

## 2012-12-26 ENCOUNTER — Other Ambulatory Visit: Payer: Self-pay | Admitting: *Deleted

## 2012-12-26 ENCOUNTER — Other Ambulatory Visit (HOSPITAL_BASED_OUTPATIENT_CLINIC_OR_DEPARTMENT_OTHER): Payer: Medicare Other

## 2012-12-26 ENCOUNTER — Ambulatory Visit (HOSPITAL_COMMUNITY)
Admission: RE | Admit: 2012-12-26 | Discharge: 2012-12-26 | Disposition: A | Discharge status: Home / Self Care | Payer: Medicare Other | Source: Ambulatory Visit | Attending: Oncology | Admitting: Oncology

## 2012-12-26 DIAGNOSIS — C50411 Malignant neoplasm of upper-outer quadrant of right female breast: Secondary | ICD-10-CM

## 2012-12-26 DIAGNOSIS — M47814 Spondylosis without myelopathy or radiculopathy, thoracic region: Secondary | ICD-10-CM | POA: Insufficient documentation

## 2012-12-26 DIAGNOSIS — K439 Ventral hernia without obstruction or gangrene: Secondary | ICD-10-CM | POA: Insufficient documentation

## 2012-12-26 DIAGNOSIS — C50419 Malignant neoplasm of upper-outer quadrant of unspecified female breast: Secondary | ICD-10-CM | POA: Insufficient documentation

## 2012-12-26 DIAGNOSIS — Z9010 Acquired absence of unspecified breast and nipple: Secondary | ICD-10-CM | POA: Insufficient documentation

## 2012-12-26 DIAGNOSIS — C50919 Malignant neoplasm of unspecified site of unspecified female breast: Secondary | ICD-10-CM

## 2012-12-26 DIAGNOSIS — E278 Other specified disorders of adrenal gland: Secondary | ICD-10-CM | POA: Insufficient documentation

## 2012-12-26 DIAGNOSIS — I700 Atherosclerosis of aorta: Secondary | ICD-10-CM | POA: Insufficient documentation

## 2012-12-26 DIAGNOSIS — R609 Edema, unspecified: Secondary | ICD-10-CM | POA: Insufficient documentation

## 2012-12-26 LAB — COMPREHENSIVE METABOLIC PANEL (CC13)
ALT: 15 U/L (ref 0–55)
Alkaline Phosphatase: 56 U/L (ref 40–150)
Anion Gap: 12 mEq/L — ABNORMAL HIGH (ref 3–11)
CO2: 26 mEq/L (ref 22–29)
Chloride: 104 mEq/L (ref 98–109)
Creatinine: 1.3 mg/dL — ABNORMAL HIGH (ref 0.6–1.1)
Sodium: 142 mEq/L (ref 136–145)
Total Bilirubin: 0.28 mg/dL (ref 0.20–1.20)
Total Protein: 7.7 g/dL (ref 6.4–8.3)

## 2012-12-26 LAB — CBC WITH DIFFERENTIAL/PLATELET
BASO%: 0.9 % (ref 0.0–2.0)
Basophils Absolute: 0 10*3/uL (ref 0.0–0.1)
EOS%: 2.1 % (ref 0.0–7.0)
Eosinophils Absolute: 0.1 10*3/uL (ref 0.0–0.5)
HCT: 34.6 % — ABNORMAL LOW (ref 34.8–46.6)
HGB: 11.2 g/dL — ABNORMAL LOW (ref 11.6–15.9)
LYMPH%: 31.1 % (ref 14.0–49.7)
MCH: 29.3 pg (ref 25.1–34.0)
MCHC: 32.5 g/dL (ref 31.5–36.0)
MCV: 90 fL (ref 79.5–101.0)
MONO#: 0.3 10*3/uL (ref 0.1–0.9)
MONO%: 6.6 % (ref 0.0–14.0)
NEUT#: 2.9 10*3/uL (ref 1.5–6.5)
NEUT%: 59.3 % (ref 38.4–76.8)
Platelets: 256 10*3/uL (ref 145–400)
RBC: 3.84 10*6/uL (ref 3.70–5.45)
RDW: 12.9 % (ref 11.2–14.5)
WBC: 4.9 10*3/uL (ref 3.9–10.3)
lymph#: 1.5 10*3/uL (ref 0.9–3.3)

## 2012-12-26 MED ORDER — IOHEXOL 300 MG/ML  SOLN
80.0000 mL | Freq: Once | INTRAMUSCULAR | Status: AC | PRN
  Administered 2012-12-26: 80 mL via INTRAVENOUS

## 2012-12-26 MED ORDER — FLUDEOXYGLUCOSE F - 18 (FDG) INJECTION: 15.7000 | Freq: Once | INTRAVENOUS | Status: DC | PRN

## 2012-12-27 LAB — GLUCOSE, CAPILLARY: Glucose-Capillary: 98 mg/dL (ref 70–99)

## 2012-12-28 ENCOUNTER — Encounter: Payer: Self-pay | Admitting: *Deleted

## 2012-12-28 ENCOUNTER — Other Ambulatory Visit: Payer: Medicare Other | Admitting: Lab

## 2012-12-28 ENCOUNTER — Ambulatory Visit (HOSPITAL_BASED_OUTPATIENT_CLINIC_OR_DEPARTMENT_OTHER): Payer: Medicare Other | Admitting: Oncology

## 2012-12-28 VITALS — BP 200/78 | HR 65 | Temp 97.8°F | Resp 19 | Ht 67.0 in | Wt 233.2 lb

## 2012-12-28 DIAGNOSIS — C50419 Malignant neoplasm of upper-outer quadrant of unspecified female breast: Secondary | ICD-10-CM

## 2012-12-28 DIAGNOSIS — Z170 Estrogen receptor positive status [ER+]: Secondary | ICD-10-CM

## 2012-12-28 DIAGNOSIS — C50411 Malignant neoplasm of upper-outer quadrant of right female breast: Secondary | ICD-10-CM

## 2012-12-28 DIAGNOSIS — Z9010 Acquired absence of unspecified breast and nipple: Secondary | ICD-10-CM

## 2012-12-28 NOTE — Telephone Encounter (Signed)
appts made and printed...td 

## 2012-12-28 NOTE — Progress Notes (Signed)
ID: Carrie Key OB: 04-Feb-1941  MR#: [id number]  CSN#:[id number]  PCP: Alva Garnet., MD GYN:   SUOvidio Kin OTHER MD: Lurline Hare, Robert Buccini  CHIEF COMPLAINT: "I found a lump in my breast".  HISTORY OF PRESENT ILLNESS: Carrie Key noted a lump in the upper-outer quadrant of her right breast and brought it to the attention of her primary care physician. She was set up for bilateral diagnostic mammography 10/31/2012 at [facility]. This showed her breast to be category A., almost entirely fatty. A 1.6 cm irregular high density mass with indistinct margins was noted in the right upper outer quadrant. This was palpable. Ultrasound of the right breast showed the mass to measure 1.5 cm, and to be lobulated. There was no axillary abnormality by ultrasound. Biopsy of the mass in question 11/01/2012 showed ([id number]) invasive ductal carcinoma, grade 1, estrogen and progesterone receptors both 100% positive. Biopsies obtained lateral and medial to this mass on the same day showed an identical morphology.  On 11/07/2012 the patient underwent bilateral breast MRI, which showed a total area of involvement measuring 13.2 cm, extending from the central to the upper lateral right breast. There were no abnormal appearing lymph nodes in the left breast was unremarkable.  Her subsequent history is as detailed below.   INTERVAL HISTORY: Carrie Key returns today for followup of her breast cancer accompanied by her significant other, Cecil. Our study nurse Genella Rife was also present during today's visit. Since her last visit here, the patient underwent right mastectomy and sentinel lymph node sampling, which showed ([id number]) an invasive ductal carcinoma measuring 9 cm, grade 2, with the single right axillary sentinel lymph node positive. This is a stage IIIa tumor. Originally of course we had only documented ductal carcinoma in situ  REVIEW OF SYSTEMS: Carrie Key did well with her surgery, although she  still has some pain in the right chest and a little bit more in her right axilla. She complains of feeling tired. She is taking hydrocodone for the pain, and this is not constipating her. She sleeps poorly. She has a little bit of ringing in her ears, but no cough, phlegm production, or sinus symptoms. Sometimes her ankles swell. She feels forgetful. She has some hot flashes, which are not particularly bothersome to her. A detailed review of systems today was otherwise noncontributory   PAST MEDICAL HISTORY: Past Medical History  Diagnosis Date  . Breast cancer   . Diabetes mellitus without complication   . Hypertension   . GERD (gastroesophageal reflux disease)   . Hyperlipemia   . Wears glasses     PAST SURGICAL HISTORY: Past Surgical History  Procedure Laterality Date  . Abdominal hysterectomy    . Colonoscopy    . Upper gi endoscopy    . Back surgery  2002    lumbar lam  . Cardiac catheterization  2003  . Simple mastectomy with axillary sentinel node biopsy Right 12/03/2012    Procedure: RIGHT SIMPLE MASTECTOMY WITH RIGHT  AXILLARY SENTINEL LYMPH NODE BIOPSY;  Surgeon: Kandis Cocking, MD;  Location: [facility];  Service: General;  Laterality: Right;    FAMILY HISTORY No family history on file. The patient's father died at the age of 79 from a myocardial infarction. The patient's mother died at the age of 16 from a stroke. The patient has 5 brothers, 4 sisters. There is no history of breast or ovarian cancer in the family  GYNECOLOGIC HISTORY:  Menarche age 65, first live birth age 89.  The patient is GX P6. She stopped having periods in 1990. She is status post total abdominal hysterectomy with bilateral salpingo-oophorectomy. She never took hormone replacement.  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 She lives by herself, with no pets, although her friend and significant other, [name], visits daily. She has one son who  died from a stroke at age 62. Son [name] worked for [employer] in [place]. Daughter [name] works in a factory in [place]. Son [name] lives in [place], and is currently unemployed. Son [name] lives in [place] and works at the airport. The patient has 11 grandchildren. She attends [employer]    ADVANCED DIRECTIVES: Not in place   HEALTH MAINTENANCE: History  Substance Use Topics  . Smoking status: Former Smoker -- 0.30 packs/day for 30 years    Types: Cigarettes    Quit date: 11/26/1988  . Smokeless tobacco: Never Used  . Alcohol Use: 2.4 oz/week    4 Glasses of wine per week     Comment: occ     Colonoscopy: 2013  PAP: Status post hysterectomy  Bone density: -  Lipid panel:  No Known Allergies  Current Outpatient Prescriptions  Medication Sig Dispense Refill  . amLODipine (NORVASC) 10 MG tablet Take 10 mg by mouth daily.      . cloNIDine (CATAPRES) 0.3 MG tablet Take 0.3 mg by mouth 2 (two) times daily.      Marland Kitchen docusate sodium (COLACE) 100 MG capsule Take 200 mg by mouth 2 (two) times daily as needed for mild constipation.      Marland Kitchen HYDROcodone-acetaminophen (NORCO/VICODIN) 5-325 MG per tablet Take 2 tablets by mouth every 6 (six) hours as needed for severe pain.  112 tablet  0  . metFORMIN (GLUCOPHAGE) 500 MG tablet Take 500 mg by mouth daily with breakfast.      . naproxen sodium (ANAPROX) 220 MG tablet Take 220 mg by mouth 2 (two) times daily as needed.      Marland Kitchen olmesartan-hydrochlorothiazide (BENICAR HCT) 40-25 MG per tablet Take 1 tablet by mouth daily.      Marland Kitchen omeprazole (PRILOSEC) 40 MG capsule Take 40 mg by mouth 2 (two) times daily.      . rosuvastatin (CRESTOR) 10 MG tablet Take 10 mg by mouth daily.       No current facility-administered medications for this visit.    OBJECTIVE: Older African American womanwho appears stated age  17 Vitals:   12/28/12 0846  BP: 200/78  Pulse: 65  Temp: 97.8 F (36.6 C)  Resp:  19     Body mass index is 36.52 kg/(m^2).    ECOG FS:1 - Symptomatic but completely ambulatory  Ocular: Sclerae unicteric, pupils equal, round and reactive to light Ear-nose-throat: Oropharynx clearAnd moist  Lymphatic: No cervical or supraclavicular adenopathy Lungs no rales or rhonchi, fair excursion bilaterally Heart regular rate and rhythm Abd soft, nontender, positive bowel sounds MSK no focal spinal tenderness, no right upper extremity lymphedema Neuro: non-focal, well-oriented, stressed  affect Breasts: The right breast is status post mastectomy. The incision still has Steri-Strips in place. There is no dehiscence, erythema, or unusual swelling.  examination of the axilla on the right is benign  The left breast and axilla are  unremarkable   LAB RESULTS:  CMP     Component Value Date/Time   NA 142 12/26/2012 1415   NA 139 05/21/2008 0424   K 4.0 12/26/2012 1415  K 3.8 05/21/2008 0424   CL 102 05/21/2008 0424   CO2 26 12/26/2012 1415   GLUCOSE 108 12/26/2012 1415   GLUCOSE 121* 05/21/2008 0424   BUN 20.1 12/26/2012 1415   BUN 20 05/21/2008 0424   CREATININE 1.3* 12/26/2012 1415   CREATININE 1.2 05/21/2008 0424   CALCIUM 10.1 12/26/2012 1415   PROT 7.7 12/26/2012 1415   ALBUMIN 3.8 12/26/2012 1415   AST 23 12/26/2012 1415   ALT 15 12/26/2012 1415   ALKPHOS 56 12/26/2012 1415   BILITOT 0.28 12/26/2012 1415    I No results found for this basename: SPEP,  UPEP,   kappa and lambda light chains    Lab Results  Component Value Date   WBC 4.9 12/26/2012   NEUTROABS 2.9 12/26/2012   HGB 11.2* 12/26/2012   HCT 34.6* 12/26/2012   MCV 90.0 12/26/2012   PLT 256 12/26/2012      Chemistry      Component Value Date/Time   NA 142 12/26/2012 1415   NA 139 05/21/2008 0424   K 4.0 12/26/2012 1415   K 3.8 05/21/2008 0424   CL 102 05/21/2008 0424   CO2 26 12/26/2012 1415   BUN 20.1 12/26/2012 1415   BUN 20 05/21/2008 0424   CREATININE 1.3* 12/26/2012 1415   CREATININE 1.2  05/21/2008 0424      Component Value Date/Time   CALCIUM 10.1 12/26/2012 1415   ALKPHOS 56 12/26/2012 1415   AST 23 12/26/2012 1415   ALT 15 12/26/2012 1415   BILITOT 0.28 12/26/2012 1415       No results found for this basename: LABCA2    No components found with this basename: LABCA125    No results found for this basename: INR,  in the last 168 hours  Urinalysis    Component Value Date/Time   COLORURINE YELLOW 05/21/2008 0410   APPEARANCEUR CLEAR 05/21/2008 0410   LABSPEC 1.010 05/21/2008 0410   PHURINE 7.0 05/21/2008 0410   GLUCOSEU NEGATIVE 05/21/2008 0410   HGBUR NEGATIVE 05/21/2008 0410   BILIRUBINUR NEGATIVE 05/21/2008 0410   KETONESUR NEGATIVE 05/21/2008 0410   PROTEINUR NEGATIVE 05/21/2008 0410   UROBILINOGEN 0.2 05/21/2008 0410   NITRITE NEGATIVE 05/21/2008 0410   LEUKOCYTESUR LARGE* 05/21/2008 0410    STUDIES: Mr Breast Bilateral W Wo Contrast  11/07/2012   CLINICAL DATA:  Patient with newly diagnosed DCIS involving papilloma.  EXAM: BILATERAL BREAST MRI WITH AND WITHOUT CONTRAST  LABS:  BUN and creatinine were obtained on site at [facility] at [address]Results: BUN 14 mg/dL, Creatinine 1.3 mg/dL.  TECHNIQUE: Multiplanar, multisequence MR images of both breasts were obtained prior to and following the intravenous administration of 20ml of MultiHance.  THREE-DIMENSIONAL MR IMAGE RENDERING ON INDEPENDENT WORKSTATION:  Three-dimensional MR images were rendered by post-processing of the original MR data on an independent workstation. The three-dimensional MR images were interpreted, and findings are reported in the following complete MRI report for this study.  COMPARISON:  Previous exams  FINDINGS: Breast composition: b. Scattered fibroglandular tissue  Background parenchymal enhancement: Mild  Right breast: 3 biopsy sites are identified in the central lateral right breast, with susceptibility artifact indicating clip placement. Extensive non mass enhancement is  seen involving the central to upper lateral right breast. The area of involvement of non mass enhancement measures 13.2 cm AP x 3.2 cm CC x 3.9 cm TR. The non mass enhancement extends 2 cm anterior to the anterior biopsy site and 1.5 cm posterior to the  posterior biopsy site. No additional discrete masses are identified. Biopsy site of the previously sampled mass is located centrally within the large area of non mass enhancement.  Left breast: No mass or abnormal enhancement.  Lymph nodes: No abnormal appearing lymph nodes.  Ancillary findings:  None.  IMPRESSION: 13.2 x 3.2 x 3.9 cm area of non mass enhancement in the central to upper lateral right breast, correlating with the previously biopsied area of DCIS involving a papilloma. This correlates with calcifications mammographically. The non mass enhancement extends 2 cm anterior to the anterior biopsy site and 1.5 cm posterior to the posterior biopsy site. No additional sites of abnormal enhancement. Negative left breast.  RECOMMENDATION: Treatment planning.  BI-RADS CATEGORY  6: Known biopsy-proven malignancy - appropriate action should be taken.   Electronically Signed   By: Jerene Dilling M.D.   On: 11/07/2012 11:40    ASSESSMENT: 72 y.o. [place] woman status post right breast biopsy at 3 separate sites in the right breast, all 3 sites showing ductal carcinoma in situ, low-grade, estrogen and progesterone receptors both 100% positive  (1) Status post right mastectomy and sentinel lymph node sampling 12/03/2012 for a pT3 pN1, stage IIIA invasive ductal carcinoma, estrogen receptor 99% positive, progesterone receptor 96% positive, with an MIB-1 of 9% and no HER-2 amplification.  (2) Oncotype DX score of 6 predicts a distant recurrence risk of 8% within the next 10 years if the patient's only systemic treatment is tamoxifen for 5 years  PLAN: I spent approximately one hour today with the patient, her husband, and our study nurse Genella Rife  discussing Carrie Key's situation. The standard NCCN guidelines for stage III estrogen receptor positive breast cancer is chemotherapy, followed by radiation and endocrine treatment. On the other hand the Oncotype recurrence score predicts no benefit from chemotherapy.  This quandary is exactly the reason of why our study S1007 was designed. It randomly assigned women like Ms. Hart Rochester to chemotherapy plus antiestrogen therapy, versus antiestrogen therapy alone for systemic treatment. I wrote all this out for the patient and urged her to participate in the study. If she does not, we will proceed with chemotherapy as per the standard recommendations.  The consent form was reviewed. The patient is going to think about it over the weekend and let us know by early next week what her choice. I have made her a return appointment with [place] for December 1 2 operationalized one of her choice she makes. She understands the goal of her treatment is cure. She knows to call for any problems that may develop before her next visit here.     Lowella Dell, MD   12/28/2012 8:50 AM

## 2013-01-07 ENCOUNTER — Other Ambulatory Visit: Payer: Self-pay | Admitting: Oncology

## 2013-01-07 ENCOUNTER — Encounter: Payer: Self-pay | Admitting: Physician Assistant

## 2013-01-07 ENCOUNTER — Telehealth: Payer: Self-pay | Admitting: Oncology

## 2013-01-07 ENCOUNTER — Ambulatory Visit (HOSPITAL_BASED_OUTPATIENT_CLINIC_OR_DEPARTMENT_OTHER): Payer: Medicare Other | Admitting: Physician Assistant

## 2013-01-07 VITALS — BP 143/77 | HR 62 | Temp 97.8°F | Resp 18 | Ht 67.0 in | Wt 230.7 lb

## 2013-01-07 DIAGNOSIS — R071 Chest pain on breathing: Secondary | ICD-10-CM

## 2013-01-07 DIAGNOSIS — Z170 Estrogen receptor positive status [ER+]: Secondary | ICD-10-CM

## 2013-01-07 DIAGNOSIS — C50411 Malignant neoplasm of upper-outer quadrant of right female breast: Secondary | ICD-10-CM

## 2013-01-07 DIAGNOSIS — M792 Neuralgia and neuritis, unspecified: Secondary | ICD-10-CM

## 2013-01-07 DIAGNOSIS — Z853 Personal history of malignant neoplasm of breast: Secondary | ICD-10-CM

## 2013-01-07 DIAGNOSIS — C50419 Malignant neoplasm of upper-outer quadrant of unspecified female breast: Secondary | ICD-10-CM

## 2013-01-07 MED ORDER — ANASTROZOLE 1 MG PO TABS: 1.0000 mg | ORAL_TABLET | Freq: Every day | ORAL | Status: DC

## 2013-01-07 MED ORDER — TRAMADOL HCL 50 MG PO TABS: 50.0000 mg | ORAL_TABLET | Freq: Four times a day (QID) | ORAL | Status: DC | PRN

## 2013-01-07 MED ORDER — GABAPENTIN 100 MG PO CAPS: 200.0000 mg | ORAL_CAPSULE | Freq: Two times a day (BID) | ORAL | Status: DC

## 2013-01-07 NOTE — Progress Notes (Signed)
ID: Carrie Key OB: [birth year]  MR#: [id number]  WUJ#:[id number]  PCP: Alva Garnet., MD GYN:   SUOvidio Kin OTHER MD: Lurline Hare, Robert Buccini  CHIEF COMPLAINT: Right Breast Cancer  HISTORY OF PRESENT ILLNESS: Carrie Key noted a lump in the upper-outer quadrant of her right breast and brought it to the attention of her primary care physician. She was set up for bilateral diagnostic mammography 10/31/2012 at [facility]. This showed her breast to be category A., almost entirely fatty. A 1.6 cm irregular high density mass with indistinct margins was noted in the right upper outer quadrant. This was palpable. Ultrasound of the right breast showed the mass to measure 1.5 cm, and to be lobulated. There was no axillary abnormality by ultrasound. Biopsy of the mass in question 11/01/2012 showed ([id number]) invasive ductal carcinoma, grade 1, estrogen and progesterone receptors both 100% positive. Biopsies obtained lateral and medial to this mass on the same day showed an identical morphology.  On 11/07/2012 the patient underwent bilateral breast MRI, which showed a total area of involvement measuring 13.2 cm, extending from the central to the upper lateral right breast. There were no abnormal appearing lymph nodes in the left breast was unremarkable.  Her subsequent history is as detailed below.   INTERVAL HISTORY: Carrie Key returns today accompanied by her significant other, Alfredo Bach for followup of her left breast cancer.  Unfortunately, Carrie Key did not qualify for participation in the research protocol, and she is here today to discuss her options regarding treatment.   Final pathology showed Carrie Key to be a stage IIIa breast cancer, and standard of care would call for 4 cycles of docetaxel/carboplatin. However, her Oncotype score was 6, predicting a distant recurrence risk of 8% within the next 10 years if the patient's only systemic treatment is tamoxifen for 5 years.   Both PET scan and Chest CT  obtained on 12/26/2012 were unremarkable, with no evidence of metastatic disease.  Physically, Carrie Key is continuing to have some postsurgical pain in the right chest wall status post mastectomy. She's been taking hydrocodone/APAP for the pain and has only a few tablets remaining. She denies pain elsewhere.   REVIEW OF SYSTEMS: Carrie Key has had no recent illnesses and denies any fevers or chills. She does have hot flashes. Her energy level is fair. Her appetite is good and she's had no nausea or emesis. She's having regular bowel movements. She's had no increased cough, phlegm production, shortness of breath, chest pain, palpitations. She occasionally has some swelling in her feet and ankles. She's had no abnormal headaches or dizziness. She also denies any problems with rashes, and currently denies any problems with vaginal dryness. She's had no vaginal bleeding, and is postmenopausal, status post TAH/BSO remotely.   A detailed review of systems today was otherwise noncontributory   PAST MEDICAL HISTORY: Past Medical History  Diagnosis Date  . Breast cancer   . Diabetes mellitus without complication   . Hypertension   . GERD (gastroesophageal reflux disease)   . Hyperlipemia   . Wears glasses   . Cancer     PAST SURGICAL HISTORY: Past Surgical History  Procedure Laterality Date  . Abdominal hysterectomy    . Colonoscopy    . Upper gi endoscopy    . Back surgery  2002    lumbar lam  . Cardiac catheterization  2003  . Simple mastectomy with axillary sentinel node biopsy Right 12/03/2012    Procedure: RIGHT SIMPLE MASTECTOMY WITH RIGHT  AXILLARY SENTINEL LYMPH NODE BIOPSY;  Surgeon: Kandis Cocking, MD;  Location:  SURGERY CENTER;  Service: General;  Laterality: Right;    FAMILY HISTORY  (updated 01/07/2013) History reviewed. No pertinent family history. The patient's father died at the age of 26 from a myocardial infarction. The patient's mother died at the age of 61 from a  stroke. The patient has 5 brothers, 4 sisters. There is no history of breast or ovarian cancer in the family  GYNECOLOGIC HISTORY:   (Updated 01/07/2013) Menarche age 50, first live birth age 42. The patient is GX P6. She stopped having periods in 1990. She is status post total abdominal hysterectomy with bilateral salpingo-oophorectomy. She never took hormone replacement.  SOCIAL HISTORY:   (Updated 01/07/2013) Carrie Key works in the [employer] at the [employer]. She lives by herself, with no pets, although her friend and significant other, Carrie Key, visits daily. She has one son who died from a stroke at age 67. Son Carrie Key worked for [employer] in [place]. Daughter Carrie Key works in a factory in [place]. Son Carrie Key lives in [place], and is currently unemployed. Son Carrie Key lives in [place] and works [place] the airport. The patient has 11 grandchildren. She attends [employer]    ADVANCED DIRECTIVES: Not in place   HEALTH MAINTENANCE:  (Updated 01/07/2013) History  Substance Use Topics  . Smoking status: Former Smoker -- 0.30 packs/day for 30 years    Types: Cigarettes    Quit date: 11/26/1988  . Smokeless tobacco: Never Used  . Alcohol Use: 2.4 oz/week    4 Glasses of wine per week     Comment: occ     Colonoscopy: 2013  PAP: Status post hysterectomy  Bone density: Not on file  Lipid panel:  Dr. Renae Gloss  No Known Allergies  Current Outpatient Prescriptions  Medication Sig Dispense Refill  . amLODipine (NORVASC) 10 MG tablet Take 10 mg by mouth daily.      . cloNIDine (CATAPRES) 0.3 MG tablet Take 0.3 mg by mouth 2 (two) times daily.      Marland Kitchen docusate sodium (COLACE) 100 MG capsule Take 200 mg by mouth 2 (two) times daily as needed for mild constipation.      Marland Kitchen HYDROcodone-acetaminophen (NORCO/VICODIN) 5-325 MG per tablet Take 2 tablets by mouth every 6 (six) hours as needed for severe pain.  112 tablet  0  . metFORMIN  (GLUCOPHAGE) 500 MG tablet Take 500 mg by mouth daily with breakfast.      . naproxen sodium (ANAPROX) 220 MG tablet Take 220 mg by mouth 2 (two) times daily as needed.      Marland Kitchen olmesartan-hydrochlorothiazide (BENICAR HCT) 40-25 MG per tablet Take 1 tablet by mouth daily.      Marland Kitchen omeprazole (PRILOSEC) 40 MG capsule Take 40 mg by mouth 2 (two) times daily.      . rosuvastatin (CRESTOR) 10 MG tablet Take 10 mg by mouth daily.      Marland Kitchen anastrozole (ARIMIDEX) 1 MG tablet Take 1 tablet (1 mg total) by mouth daily.  30 tablet  3  . gabapentin (NEURONTIN) 100 MG capsule Take 2 capsules (200 mg total) by mouth 2 (two) times daily. Or as directed for pain  120 capsule  3  . traMADol (ULTRAM) 50 MG tablet Take 1 tablet (50 mg total) by mouth every 6 (six) hours as needed for moderate pain.  60 tablet  0   No current facility-administered medications for this visit.    OBJECTIVE: Older  African American woman who appears stated age and is in no acute distress Filed Vitals:   01/07/13 1455  BP: 143/77  Pulse: 62  Temp: 97.8 F (36.6 C)  Resp: 18     Body mass index is 36.12 kg/(m^2).    ECOG FS: 1 Filed Weights   01/07/13 1455  Weight: 230 lb 11.2 oz (104.645 kg)   Remainder of physical exam was deferred today.   LAB RESULTS:  Lab Results  Component Value Date   WBC 4.9 12/26/2012   NEUTROABS 2.9 12/26/2012   HGB 11.2* 12/26/2012   HCT 34.6* 12/26/2012   MCV 90.0 12/26/2012   PLT 256 12/26/2012      Chemistry      Component Value Date/Time   NA 142 12/26/2012 1415   NA 139 05/21/2008 0424   K 4.0 12/26/2012 1415   K 3.8 05/21/2008 0424   CL 102 05/21/2008 0424   CO2 26 12/26/2012 1415   BUN 20.1 12/26/2012 1415   BUN 20 05/21/2008 0424   CREATININE 1.3* 12/26/2012 1415   CREATININE 1.2 05/21/2008 0424      Component Value Date/Time   CALCIUM 10.1 12/26/2012 1415   ALKPHOS 56 12/26/2012 1415   AST 23 12/26/2012 1415   ALT 15 12/26/2012 1415   BILITOT 0.28 12/26/2012 1415        STUDIES: Ct Chest W Contrast  12/26/2012   CLINICAL DATA:  New diagnosis of breast cancer involving the upper outer quadrant of right breast.  EXAM: CT CHEST WITH CONTRAST  TECHNIQUE: Multidetector CT imaging of the chest was performed during intravenous contrast administration.  CONTRAST:  80mL OMNIPAQUE IOHEXOL 300 MG/ML  SOLN  COMPARISON:  Today's PET, dictated separately. Breast MRI of 11/07/2012. Prior chest CT of 01/07/2006 from [facility].  FINDINGS: Lungs/Pleura:  No nodules or airspace opacities.  No pleural fluid.  Heart/Mediastinum: No supraclavicular adenopathy. Prominent right lobe of the thyroid, without focal nodule identified. No axillary adenopathy. Status post right mastectomy with edema and skin thickening within the chest wall. Minimal subcutaneous gas.  Ascending aorta upper normal in size, 3.9 cm. Atherosclerosis throughout the aorta. Mild cardiomegaly. No central pulmonary embolism, on this non-dedicated study. No mediastinal or hilar adenopathy. No internal mammary adenopathy.  Upper Abdomen: Old granulomatous disease in the right lobe of the liver. Right adrenal nodule, as detailed on PET. This was present on 01/07/2006, consistent with a benign etiology.  Bones/Musculoskeletal: No acute osseous abnormality. Moderate thoracic spondylosis.  IMPRESSION: 1. Status post right mastectomy, without evidence of residual or metastatic disease. 2. Stable right adrenal nodule since 2007, consistent with a benign etiology, likely an adenoma.   Electronically Signed   By: Jeronimo Greaves M.D.   On: 12/26/2012 16:38   Nm Pet Image Initial (pi) Skull Base To Thigh  12/26/2012   CLINICAL DATA:  Initial treatment strategy for staging of breast cancer. Upper outer quadrant of right breast. Status post mastectomy on 12/03/2012.  EXAM: NUCLEAR MEDICINE PET SKULL BASE TO THIGH  FASTING BLOOD GLUCOSE:  Value: 98mg /dl  TECHNIQUE: 82.9 mCi F-62 FDG was injected intravenously. CT data  was obtained and used for attenuation correction and anatomic localization only. (This was not acquired as a diagnostic CT examination.) Additional exam technical data entered on technologist worksheet.  COMPARISON:  Chest CT same date, dictated separately. Breast MRI of 11/07/2012. Chest CT from [facility], 01/07/2006.  FINDINGS: NECK  Mild motion degradation within the neck. The entire exam is mildly  degraded by patient body habitus. Given this factor, no abnormal activity within the neck.  CHEST  There is heterogeneous diffuse hypermetabolism involving the chest wall, at the site of mastectomy. No abnormal thoracic nodal hypermetabolism.  ABDOMEN/PELVIS  No areas of abnormal hypermetabolism.  SKELETON  No abnormal marrow activity.  CT IMAGES PERFORMED FOR ATTENUATION CORRECTION  No further findings within the neck. Chest findings deferred to today's diagnostic CT.  Right adrenal nodule which measures 1.0 cm and 19 HU on image 125. Not hypermetabolic.  Underdistended proximal stomach. Apparent mild wall thickening is felt to be secondary. Hysterectomy.  Right paracentral high pelvic fat containing ventral abdominal wall hernia. Dilated thoracic duct on image 122.  IMPRESSION: 1. Mildly degraded exam secondary to patient body habitus and motion within the neck. 2. Surgical changes of right mastectomy. Low-level right chest wall hypermetabolism and subcutaneous edema are likely postoperative. 3. No evidence of hypermetabolic metastatic disease. 4. Right adrenal nodule. Stability since 2007 is consistent with a benign lesion. Likely an adenoma. 5. Fat containing ventral pelvic wall hernia.   Electronically Signed   By: Jeronimo Greaves M.D.   On: 12/26/2012 16:39     ASSESSMENT: 73 y.o. Carrie Key woman status post right breast biopsy at 3 separate sites in the right breast, all 3 sites showing ductal carcinoma in situ, low-grade, estrogen and progesterone receptors both 100% positive  (1) Status post right  mastectomy and sentinel lymph node sampling 12/03/2012 for a pT3 pN1, stage IIIA invasive ductal carcinoma, estrogen receptor 99% positive, progesterone receptor 96% positive, with an MIB-1 of 9% and no HER-2 amplification.  (2) Oncotype DX score of 6 predicts a distant recurrence risk of 8% within the next 10 years if the patient's only systemic treatment is tamoxifen for 5 years  (3)  to begin the anastrozole, 01/07/2013, the plan being to continue for 5 years.  PLAN: Our entire 45 minute appointment was spent today reviewing Akeylah's treatment options in reviewing possible benefits and side effects.  This case was reviewed in detail with Dr. Darnelle Catalan today. He is very comfortable with pursuing anti-estrogens alone (specifically anastrozole), and foregoing chemotherapy in Danijela's case. Jowanda and I reviewed this in detail today, and she was given all of this information in writing. We reviewed the benefits as well as the possible side effects associated with both chemotherapy and the antiestrogen therapy. We also discussed the fact that, based on her Oncotype score, she is unlikely to benefit a great deal from the addition of chemotherapy. She did understand, however, that we were very willing to pursue chemotherapy if that was her wish, but she also is comfortable "just taking a pill".  Accordingly, I am prescribing anastrozole, 1 mg daily. She'll go ahead and start this today or tomorrow, and we will plan on seeing her back in approximately one month to assess tolerance. If at that time, she is tolerating the aromatase inhibitor well, we will order a baseline bone density to evaluate for any signs of osteopenia or osteoporosis.   Her other concern today is postsurgical pain. I would prefer she not be only hydrocodone/APAP for a prolonged period of time. Instead, I am prescribing tramadol, 50 mg up to 4 times daily as needed for pain. We will also start her on gabapentin, which should not only help  the postsurgical pain, but perhaps decrease her hot flashes as well. We will start her on a low dose at bedtime only, and will gradually increase this to give her better pain  control throughout the day. She'll start with 200 mg at night for 7 days. She'll then increase to 100 mg in the morning plus to 200 mg at bedtime for another week. After that week,  If she is tolerating the gabapentin well, she will increase to 200 mg in the morning and 200 mg at night. She was given all of these instructions in writing today as well.  Rasheeda voices her understanding and agreement with the above. She'll be seeing Dr. Ezzard Standing for followup next week, and we'll see Dr. Darnelle Catalan in early January to assess tolerance of the anastrozole. She knows to call prior to her next appointment here if she has any changes or problems.   Brianny Soulliere, PA-C   01/07/2013 5:12 PM

## 2013-01-16 ENCOUNTER — Encounter (INDEPENDENT_AMBULATORY_CARE_PROVIDER_SITE_OTHER): Payer: Self-pay | Admitting: Surgery

## 2013-01-16 ENCOUNTER — Ambulatory Visit (INDEPENDENT_AMBULATORY_CARE_PROVIDER_SITE_OTHER): Payer: Medicare Other | Admitting: Surgery

## 2013-01-16 VITALS — BP 132/80 | HR 68 | Temp 98.0°F | Resp 18 | Ht 67.0 in | Wt 231.0 lb

## 2013-01-16 DIAGNOSIS — C50411 Malignant neoplasm of upper-outer quadrant of right female breast: Secondary | ICD-10-CM

## 2013-01-16 DIAGNOSIS — C50419 Malignant neoplasm of upper-outer quadrant of unspecified female breast: Secondary | ICD-10-CM

## 2013-01-16 NOTE — Progress Notes (Addendum)
Re:   Carrie Key DOB:   [birth year] MRN:   [id number]  [facility]  ASSESSMENT AND PLAN: 1.  Right breast cancer, T3, N1  Right mastectomy with SLNBx - 12/03/2012 - [facility]  Final path - invasive ductal cancer, grade II of III, spans 9 cm, 1/1 node,  ER - 99%, PR - 96%, Ki67 - 9%, Her2 Neu - neg  Treating oncologist - Magrinat/[facility]entworth  Oncotype 6, Recurrence rate - 8%.  On Arimidex.  To return to see me in 6 months.   2.  Diabetes Mellitus - on oral hypoglycemics 3.  Hypertension 4.  GERD 5.  Hypercholesterolemia 6.  Umbilical hernia  Chief Complaint  Patient presents with  . Routine Post Op    masty   REFERRING PHYSICIAN: Alva Garnet., MD  HISTORY OF PRESENT ILLNESS: Carrie Key is a 72 y.o. (DOB: Oct 16, 1940)  AA  female whose primary care physician is Alva Garnet., MD and comes for follow up of a right mastectomy. Carrie Key, a friend, is with her. She had a favorable Oncotype - so she is on anti-estrogen pill only.  She is still not moving her right arm well. She has some mild right axillary pain, but otherwise, no complaint. We talked about the ABC class.  She has the information and is going to set up an appt in 2-4 weeks. CT/PET - 12/26/2012 - was negative except for a stable right adrenal nodule.  History of Breast Cancer (11/2012): She has no family history of breast cancer.  She is not on hormone meds. She has had a hysterectomy. The patient's last mammogram was 05/07/2010.   She had recent mammograms on 10/31/2012 at [facility] that showed a 1.6 cm area that was suspicious for cancer. Biopsy x 3 on 11/02/2102 (Accession: [id number] showed DCIS involving a papilloma x 3. She had a MRI 11/07/2012 that showed:  a 13.2 x 3.2 x 3.9 cm area of non mass enhancement in the central to upper lateral right breast, correlating with the previously biopsied area of DCIS involving a papilloma. This correlates with calcifications mammographically. The non mass enhancement  extends 2 cm anterior to the anterior biopsy site and 1.5 cm posterior to the posterior biopsy site.   Past Medical History  Diagnosis Date  . Breast cancer   . Diabetes mellitus without complication   . Hypertension   . GERD (gastroesophageal reflux disease)   . Hyperlipemia   . Wears glasses   . Cancer    Past Surgical History  Procedure Laterality Date  . Abdominal hysterectomy    . Colonoscopy    . Upper gi endoscopy    . Back surgery  2002    lumbar lam  . Cardiac catheterization  2003  . Simple mastectomy with axillary sentinel node biopsy Right 12/03/2012    Procedure: RIGHT SIMPLE MASTECTOMY WITH RIGHT  AXILLARY SENTINEL LYMPH NODE BIOPSY;  Surgeon: Kandis Cocking, MD;  Location: [facility];  Service: General;  Laterality: Right;    Current Outpatient Prescriptions  Medication Sig Dispense Refill  . amLODipine (NORVASC) 10 MG tablet Take 10 mg by mouth daily.      Marland Kitchen anastrozole (ARIMIDEX) 1 MG tablet Take 1 tablet (1 mg total) by mouth daily.  30 tablet  3  . cloNIDine (CATAPRES) 0.3 MG tablet Take 0.3 mg by mouth 2 (two) times daily.      Marland Kitchen docusate sodium (COLACE) 100 MG capsule Take 200 mg by mouth 2 (two) times daily as  needed for mild constipation.      . gabapentin (NEURONTIN) 100 MG capsule Take 2 capsules (200 mg total) by mouth 2 (two) times daily. Or as directed for pain  120 capsule  3  . HYDROcodone-acetaminophen (NORCO/VICODIN) 5-325 MG per tablet Take 2 tablets by mouth every 6 (six) hours as needed for severe pain.  112 tablet  0  . metFORMIN (GLUCOPHAGE) 500 MG tablet Take 500 mg by mouth daily with breakfast.      . naproxen sodium (ANAPROX) 220 MG tablet Take 220 mg by mouth 2 (two) times daily as needed.      Marland Kitchen olmesartan-hydrochlorothiazide (BENICAR HCT) 40-25 MG per tablet Take 1 tablet by mouth daily.      Marland Kitchen omeprazole (PRILOSEC) 40 MG capsule Take 40 mg by mouth 2 (two) times daily.      . rosuvastatin (CRESTOR) 10 MG tablet Take 10 mg  by mouth daily.      . traMADol (ULTRAM) 50 MG tablet Take 1 tablet (50 mg total) by mouth every 6 (six) hours as needed for moderate pain.  60 tablet  0   No current facility-administered medications for this visit.     No Known Allergies  REVIEW OF SYSTEMS: Cardiac:  Hypertension since 1994.  Seen by cardiologist remotely at [facility] cardiology. Endocrine:  Diabetes mellitus since 2012. No thyroid disease.  Hypercholesterolemia. Gastrointestinal:  No history of stomach disease.  No history of liver disease.  No history of gall bladder disease.  No history of pancreas disease.  No history of colon disease.  Right sided abdominal pain, maybe better recently. Urologic:  No history of kidney stones.  No history of bladder infections. Musculoskeletal:  Right knee pain - on Tramadol.  Back surgery 1997 - has done well from this.  SOCIAL and FAMILY HISTORY: Widowed. 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 Carrie Key, a friend, is with her. She has 4 living children.  3 boys and 1 girl, ages 40 to 2.  Her daughter lives in [place].  PHYSICAL EXAM: BP 132/80  Pulse 68  Temp(Src) 98 F (36.7 C)  Resp 18  Ht 5\' 7"  (1.702 m)  Wt 231 lb (104.781 kg)  BMI 36.17 kg/m2  General: AA F who is alert and generally healthy appearing.  HEENT: Normal. Pupils equal. Neck: Supple. No mass.  No thyroid mass. Lymph Nodes:  No supraclavicular, cervical, or axillary nodes. Breasts:  Right:  Absent.  Some redundant tissue, but overall looks good.  Left - No mass Extremties:  She can lift her right arm about 110 degrees.  Good hand strength.  No lymphedema.  DATA REVIEWED: Path to patient.  Ovidio Kin, MD,  [facility] [address] [place]  [address]   [place], [place]    [address] Phone:  [phone] FAX:  [phone]

## 2013-02-12 ENCOUNTER — Other Ambulatory Visit: Payer: Self-pay | Admitting: *Deleted

## 2013-02-12 ENCOUNTER — Ambulatory Visit (HOSPITAL_BASED_OUTPATIENT_CLINIC_OR_DEPARTMENT_OTHER): Payer: Medicare Other | Admitting: Oncology

## 2013-02-12 ENCOUNTER — Telehealth: Payer: Self-pay | Admitting: Oncology

## 2013-02-12 ENCOUNTER — Other Ambulatory Visit (HOSPITAL_BASED_OUTPATIENT_CLINIC_OR_DEPARTMENT_OTHER): Payer: Medicare Other

## 2013-02-12 VITALS — BP 200/85 | HR 58 | Temp 98.6°F | Resp 18 | Ht 67.0 in | Wt 232.0 lb

## 2013-02-12 DIAGNOSIS — Z853 Personal history of malignant neoplasm of breast: Secondary | ICD-10-CM

## 2013-02-12 DIAGNOSIS — C50411 Malignant neoplasm of upper-outer quadrant of right female breast: Secondary | ICD-10-CM

## 2013-02-12 DIAGNOSIS — Z9010 Acquired absence of unspecified breast and nipple: Secondary | ICD-10-CM

## 2013-02-12 DIAGNOSIS — G8918 Other acute postprocedural pain: Secondary | ICD-10-CM

## 2013-02-12 DIAGNOSIS — C50419 Malignant neoplasm of upper-outer quadrant of unspecified female breast: Secondary | ICD-10-CM

## 2013-02-12 LAB — COMPREHENSIVE METABOLIC PANEL (CC13)
ALK PHOS: 58 U/L (ref 40–150)
ALT: 9 U/L (ref 0–55)
AST: 11 U/L (ref 5–34)
Albumin: 3.9 g/dL (ref 3.5–5.0)
Anion Gap: 8 mEq/L (ref 3–11)
BILIRUBIN TOTAL: 0.3 mg/dL (ref 0.20–1.20)
BUN: 22 mg/dL (ref 7.0–26.0)
CO2: 29 meq/L (ref 22–29)
Calcium: 9.9 mg/dL (ref 8.4–10.4)
Chloride: 105 mEq/L (ref 98–109)
Creatinine: 1.2 mg/dL — ABNORMAL HIGH (ref 0.6–1.1)
Glucose: 94 mg/dl (ref 70–140)
Potassium: 4 mEq/L (ref 3.5–5.1)
SODIUM: 142 meq/L (ref 136–145)
TOTAL PROTEIN: 7.7 g/dL (ref 6.4–8.3)

## 2013-02-12 LAB — CBC WITH DIFFERENTIAL/PLATELET
BASO%: 0.8 % (ref 0.0–2.0)
Basophils Absolute: 0.1 10*3/uL (ref 0.0–0.1)
EOS%: 2.1 % (ref 0.0–7.0)
Eosinophils Absolute: 0.2 10*3/uL (ref 0.0–0.5)
HCT: 32.8 % — ABNORMAL LOW (ref 34.8–46.6)
HGB: 10.8 g/dL — ABNORMAL LOW (ref 11.6–15.9)
LYMPH%: 22.9 % (ref 14.0–49.7)
MCH: 29.7 pg (ref 25.1–34.0)
MCHC: 33 g/dL (ref 31.5–36.0)
MCV: 89.8 fL (ref 79.5–101.0)
MONO#: 0.5 10*3/uL (ref 0.1–0.9)
MONO%: 7 % (ref 0.0–14.0)
NEUT#: 4.9 10*3/uL (ref 1.5–6.5)
NEUT%: 67.2 % (ref 38.4–76.8)
PLATELETS: 228 10*3/uL (ref 145–400)
RBC: 3.65 10*6/uL — AB (ref 3.70–5.45)
RDW: 13.4 % (ref 11.2–14.5)
WBC: 7.3 10*3/uL (ref 3.9–10.3)
lymph#: 1.7 10*3/uL (ref 0.9–3.3)

## 2013-02-12 MED ORDER — TRAMADOL HCL 50 MG PO TABS: 100.0000 mg | ORAL_TABLET | Freq: Four times a day (QID) | ORAL | Status: DC | PRN

## 2013-02-12 MED ORDER — GABAPENTIN 300 MG PO CAPS: 300.0000 mg | ORAL_CAPSULE | Freq: Three times a day (TID) | ORAL | Status: DC

## 2013-02-12 MED ORDER — NAPROXEN 500 MG PO TABS: 500.0000 mg | ORAL_TABLET | Freq: Three times a day (TID) | ORAL | Status: DC

## 2013-02-12 NOTE — Progress Notes (Signed)
ID: Carrie Key OB: 02-Jul-1940  MR#: [id number]  LSL#:[id number]  PCP: Salena Saner., MD GYN:   SUAlphonsa Overall OTHER MD: Thea Silversmith, Robert Buccini  CHIEF COMPLAINT: Right Breast Cancer  HISTORY OF PRESENT ILLNESS: Carrie Key noted a lump in the upper-outer quadrant of her right breast and brought it to the attention of her primary care physician. She was set up for bilateral diagnostic mammography 10/31/2012 at [facility]. This showed her breast to be category A., almost entirely fatty. A 1.6 cm irregular high density mass with indistinct margins was noted in the right upper outer quadrant. This was palpable. Ultrasound of the right breast showed the mass to measure 1.5 cm, and to be lobulated. There was no axillary abnormality by ultrasound. Biopsy of the mass in question 11/01/2012 showed ([id number]) invasive ductal carcinoma, grade 1, estrogen and progesterone receptors both 100% positive. Biopsies obtained lateral and medial to this mass on the same day showed an identical morphology.  On 11/07/2012 the patient underwent bilateral breast MRI, which showed a total area of involvement measuring 13.2 cm, extending from the central to the upper lateral right breast. There were no abnormal appearing lymph nodes in the left breast was unremarkable.  Her subsequent history is as detailed below.   INTERVAL HISTORY: [name] returns today accompanied by her significant other, Carrie Key, for followup of her left breast cancer. Since her last visit here she started anastrozole, and she is tolerating that well. The only side effect she reports is mild to moderate increase in hot flashes, for which are not a major issue for her.  REVIEW OF SYSTEMS: [name] unfortunately continues to have significant postoperative pain. It is located in the anterior right chest wall, right axilla and the medial right upper arm. When he did so "it goes all the way to my toes. This doesn't letter sleep, and it does tend to  be worse at night. She is currently on naproxen and 220 mg 3 times a day, which is not clear whether she is taking (she does not recognize the medication); gabapentin 200 mg twice a day which was started last visit, and tramadol 50 mg up to 4 times a day as needed, which he takes about 4 times a day currently. She is not constipated on this regimen. She has had no unusual headaches, no visual changes, she has mild ankle swelling at times, she can feel short of breath when walking up stairs, and she has chronic low back pain. Otherwise a detailed review of systems was entirely stable  PAST MEDICAL HISTORY: Past Medical History  Diagnosis Date  . Breast cancer   . Diabetes mellitus without complication   . Hypertension   . GERD (gastroesophageal reflux disease)   . Hyperlipemia   . Wears glasses   . Cancer     PAST SURGICAL HISTORY: Past Surgical History  Procedure Laterality Date  . Abdominal hysterectomy    . Colonoscopy    . Upper gi endoscopy    . Back surgery  2002    lumbar lam  . Cardiac catheterization  2003  . Simple mastectomy with axillary sentinel node biopsy Right 12/03/2012    Procedure: RIGHT SIMPLE MASTECTOMY WITH RIGHT  AXILLARY SENTINEL LYMPH NODE BIOPSY;  Surgeon: Shann Medal, MD;  Location: [facility];  Service: General;  Laterality: Right;    FAMILY HISTORY  (updated 01/07/2013) No family history on file. The patient's father died at the age of 62 from a myocardial infarction. The patient's  mother died at the age of 47 from a stroke. The patient has 5 brothers, 4 sisters. There is no history of breast or ovarian cancer in the family  GYNECOLOGIC HISTORY:   (Updated 01/07/2013) Menarche age 54, first live birth age 31. The patient is GX P6. She stopped having periods in 1990. She is status post total abdominal hysterectomy with bilateral salpingo-oophorectomy. She never took hormone replacement.  SOCIAL HISTORY:   (Updated 01/07/2013) Tinea  worked in [employer] at [employer] but retired 2000. She lives by herself, with no pets, although her friend and significant other, Carrie Key, visits daily. She has one son who died from a stroke at age 88. Son Carrie Key works for [employer] in [place]. Daughter Carrie Key works in a [employer] in [place]. Son Carrie Key lives in [place], and is currently unemployed. Son Carrie Key lives in [place] and works at the airport. The patient has 11 grandchildren. She attends [employer]    ADVANCED DIRECTIVES: Not in place   HEALTH MAINTENANCE:  (Updated 01/07/2013) History  Substance Use Topics  . Smoking status: Former Smoker -- 0.30 packs/day for 30 years    Types: Cigarettes    Quit date: 11/26/1988  . Smokeless tobacco: Never Used  . Alcohol Use: 2.4 oz/week    4 Glasses of wine per week     Comment: occ     Colonoscopy: 2013  PAP: Status post hysterectomy  Bone density: Not on file  Lipid panel:  Dr. Karlton Lemon  No Known Allergies  Current Outpatient Prescriptions  Medication Sig Dispense Refill  . amLODipine (NORVASC) 10 MG tablet Take 10 mg by mouth daily.      Marland Kitchen anastrozole (ARIMIDEX) 1 MG tablet Take 1 tablet (1 mg total) by mouth daily.  30 tablet  3  . cloNIDine (CATAPRES) 0.3 MG tablet Take 0.3 mg by mouth 2 (two) times daily.      Marland Kitchen docusate sodium (COLACE) 100 MG capsule Take 200 mg by mouth 2 (two) times daily as needed for mild constipation.      . gabapentin (NEURONTIN) 100 MG capsule Take 2 capsules (200 mg total) by mouth 2 (two) times daily. Or as directed for pain  120 capsule  3  . HYDROcodone-acetaminophen (NORCO/VICODIN) 5-325 MG per tablet Take 2 tablets by mouth every 6 (six) hours as needed for severe pain.  112 tablet  0  . metFORMIN (GLUCOPHAGE) 500 MG tablet Take 500 mg by mouth daily with breakfast.      . naproxen sodium (ANAPROX) 220 MG tablet Take 220 mg by mouth 2 (two) times daily as needed.      Marland Kitchen  olmesartan-hydrochlorothiazide (BENICAR HCT) 40-25 MG per tablet Take 1 tablet by mouth daily.      Marland Kitchen omeprazole (PRILOSEC) 40 MG capsule Take 40 mg by mouth 2 (two) times daily.      . rosuvastatin (CRESTOR) 10 MG tablet Take 10 mg by mouth daily.      . traMADol (ULTRAM) 50 MG tablet Take 1 tablet (50 mg total) by mouth every 6 (six) hours as needed for moderate pain.  60 tablet  0   No current facility-administered medications for this visit.    OBJECTIVE: Older African American woman who appears uncomfortable Filed Vitals:   02/12/13 1402  BP: 200/85  Pulse: 58  Temp:   Resp: 18     Body mass index is 36.33 kg/(m^2).    ECOG FS: 1 Filed Weights  02/12/13 1400  Weight: 232 lb (105.235 kg)   Sclerae unicteric, pupils equal and round Oropharynx clear and moist-- no thrush No cervical or supraclavicular adenopathy Lungs no rales or rhonchi Heart regular rate and rhythm Abd soft, nontender, positive bowel sounds MSK no focal spinal tenderness, no upper extremity lymphedema Neuro: nonfocal, well oriented, appropriate affect Breasts: The right breast is status post mastectomy. There are no skin changes suggestive of local recurrence. I do not palpate any masses in the right anterior chest, right axilla, or medial upper extremity. The left breast is unremarkable   LAB RESULTS:  Lab Results  Component Value Date   WBC 7.3 02/12/2013   NEUTROABS 4.9 02/12/2013   HGB 10.8* 02/12/2013   HCT 32.8* 02/12/2013   MCV 89.8 02/12/2013   PLT 228 02/12/2013      Chemistry      Component Value Date/Time   NA 142 02/12/2013 1329   NA 139 05/21/2008 0424   K 4.0 02/12/2013 1329   K 3.8 05/21/2008 0424   CL 102 05/21/2008 0424   CO2 29 02/12/2013 1329   BUN 22.0 02/12/2013 1329   BUN 20 05/21/2008 0424   CREATININE 1.2* 02/12/2013 1329   CREATININE 1.2 05/21/2008 0424      Component Value Date/Time   CALCIUM 9.9 02/12/2013 1329   ALKPHOS 58 02/12/2013 1329   AST 11 02/12/2013 1329   ALT 9 02/12/2013 1329    BILITOT 0.30 02/12/2013 1329       STUDIES: No results found.  ASSESSMENT: 73 y.o. [place] woman status post right breast biopsy at 3 separate sites in the right breast, all 3 sites showing ductal carcinoma in situ, low-grade, estrogen and progesterone receptors both 100% positive  (1) Status post right mastectomy and sentinel lymph node sampling 12/03/2012 for a pT3 pN1, stage IIIA invasive ductal carcinoma, estrogen receptor 99% positive, progesterone receptor 96% positive, with an MIB-1 of 9% and no HER-2 amplification.  (2) Oncotype DX score of 6 predicts a distant recurrence risk of 8% within the next 10 years if the patient's only systemic treatment is tamoxifen for 5 years. It also predicts no benefit from chemotherapy  (3)  to begin the anastrozole, 01/07/2013, the plan being to continue for 5 years.  PLAN: We spent well over 45 minutes discussing the patient's complex situation. The good news is that family is tolerating the anastrozole without any significant side effects other than mild to moderate hot flashes. The plan is going to be to continue that for 5 years we will obtain a bone density before her next visit here which will be in 3 months.  The bad news is that she continues to have severe pain in the operative field. Staging studies including a PET scan did not suggest any evidence of residual disease. This is primarily in the right axilla and medial upper arm. Today I increased her naproxen 2 500 mg 3 times a day with meals, increased her tramadol to 100 mg 4 times a day as needed, and changed her gabapentin to 300 mg at bedtime. I gave her these instructions in writing, and put in the prescriptions directly to her pharmacy.  I also put in a referral to the pain clinic and to rehabilitation.  I asked Megen to give [place] a call in a week to tell us whether or the new pain regimen is working for her. She will also let us know whether the referrals to the pain clinic and  rehabilitation have been  successfully operationalized. If the pain remains poorly controlled, we can increase the bedtime gabapentin to 600, but there is not a lot of room air short of going to narcotics, which I would not want to do outside of a pain clinic.  The patient has a good understanding of this plan, and agrees with it. She knows to call for any problems that may develop before her next visit here.  Chauncey Cruel, MD   02/12/2013 2:23 PM

## 2013-02-12 NOTE — Telephone Encounter (Signed)
, °

## 2013-02-19 ENCOUNTER — Ambulatory Visit: Payer: Medicare Other | Attending: Surgery | Admitting: Physical Therapy

## 2013-02-19 ENCOUNTER — Telehealth: Payer: Self-pay | Admitting: *Deleted

## 2013-02-19 DIAGNOSIS — C50919 Malignant neoplasm of unspecified site of unspecified female breast: Secondary | ICD-10-CM | POA: Insufficient documentation

## 2013-02-19 DIAGNOSIS — R293 Abnormal posture: Secondary | ICD-10-CM | POA: Insufficient documentation

## 2013-02-19 DIAGNOSIS — M4000 Postural kyphosis, site unspecified: Secondary | ICD-10-CM | POA: Insufficient documentation

## 2013-02-19 DIAGNOSIS — IMO0001 Reserved for inherently not codable concepts without codable children: Secondary | ICD-10-CM | POA: Insufficient documentation

## 2013-02-19 NOTE — Telephone Encounter (Signed)
Pt left message wanting to inform MD per his request " let him know the medication he prescribed is working fine and I feel good ".  This note will be given to MD.

## 2013-02-27 ENCOUNTER — Ambulatory Visit: Payer: Medicare Other | Admitting: Physical Therapy

## 2013-03-01 ENCOUNTER — Ambulatory Visit: Payer: Medicare Other | Admitting: Physical Therapy

## 2013-03-05 ENCOUNTER — Encounter: Payer: Medicare Other | Admitting: Physical Therapy

## 2013-03-07 ENCOUNTER — Ambulatory Visit: Payer: Medicare Other

## 2013-03-12 ENCOUNTER — Ambulatory Visit: Payer: Medicare Other | Attending: Surgery | Admitting: Physical Therapy

## 2013-03-12 DIAGNOSIS — IMO0001 Reserved for inherently not codable concepts without codable children: Secondary | ICD-10-CM | POA: Insufficient documentation

## 2013-03-12 DIAGNOSIS — R293 Abnormal posture: Secondary | ICD-10-CM | POA: Insufficient documentation

## 2013-03-12 DIAGNOSIS — C50919 Malignant neoplasm of unspecified site of unspecified female breast: Secondary | ICD-10-CM | POA: Insufficient documentation

## 2013-03-12 DIAGNOSIS — M4000 Postural kyphosis, site unspecified: Secondary | ICD-10-CM | POA: Insufficient documentation

## 2013-03-14 ENCOUNTER — Ambulatory Visit: Payer: Medicare Other

## 2013-03-19 ENCOUNTER — Ambulatory Visit: Payer: Medicare Other | Admitting: Physical Therapy

## 2013-03-21 ENCOUNTER — Ambulatory Visit: Payer: Medicare Other

## 2013-03-26 ENCOUNTER — Encounter: Payer: Medicare Other | Admitting: Physical Therapy

## 2013-03-28 ENCOUNTER — Ambulatory Visit: Payer: Medicare Other

## 2013-04-02 ENCOUNTER — Ambulatory Visit: Payer: Medicare Other | Admitting: Physical Therapy

## 2013-04-04 ENCOUNTER — Ambulatory Visit: Payer: Medicare Other | Admitting: Physical Therapy

## 2013-04-08 ENCOUNTER — Encounter: Payer: Medicare Other | Admitting: Physical Therapy

## 2013-04-12 ENCOUNTER — Telehealth: Payer: Self-pay | Admitting: *Deleted

## 2013-04-12 NOTE — Telephone Encounter (Signed)
sw pt informed her that AGB will be in cme on 05/14/13. gv appt for 05/29/13 w/albs@ 1:15pm and ov@ 1:45pm. Pt is aware...td

## 2013-04-16 ENCOUNTER — Ambulatory Visit: Payer: Medicare Other | Attending: Surgery | Admitting: Physical Therapy

## 2013-04-16 DIAGNOSIS — M4000 Postural kyphosis, site unspecified: Secondary | ICD-10-CM | POA: Insufficient documentation

## 2013-04-16 DIAGNOSIS — IMO0001 Reserved for inherently not codable concepts without codable children: Secondary | ICD-10-CM | POA: Insufficient documentation

## 2013-04-16 DIAGNOSIS — C50919 Malignant neoplasm of unspecified site of unspecified female breast: Secondary | ICD-10-CM | POA: Insufficient documentation

## 2013-04-16 DIAGNOSIS — R293 Abnormal posture: Secondary | ICD-10-CM | POA: Insufficient documentation

## 2013-04-18 ENCOUNTER — Ambulatory Visit: Payer: Medicare Other

## 2013-04-22 ENCOUNTER — Ambulatory Visit: Payer: Medicare Other | Admitting: Physical Therapy

## 2013-04-25 ENCOUNTER — Ambulatory Visit: Payer: Medicare Other

## 2013-04-29 ENCOUNTER — Encounter: Payer: Medicare Other | Admitting: Physical Therapy

## 2013-04-30 ENCOUNTER — Ambulatory Visit: Payer: Medicare Other | Admitting: Physical Therapy

## 2013-05-02 ENCOUNTER — Ambulatory Visit: Payer: Medicare Other | Admitting: Physical Therapy

## 2013-05-06 ENCOUNTER — Ambulatory Visit: Payer: Medicare Other | Admitting: Physical Therapy

## 2013-05-13 ENCOUNTER — Ambulatory Visit: Payer: Medicare Other | Attending: Surgery | Admitting: Physical Therapy

## 2013-05-13 DIAGNOSIS — C50919 Malignant neoplasm of unspecified site of unspecified female breast: Secondary | ICD-10-CM | POA: Insufficient documentation

## 2013-05-13 DIAGNOSIS — IMO0001 Reserved for inherently not codable concepts without codable children: Secondary | ICD-10-CM | POA: Insufficient documentation

## 2013-05-13 DIAGNOSIS — M4000 Postural kyphosis, site unspecified: Secondary | ICD-10-CM | POA: Insufficient documentation

## 2013-05-13 DIAGNOSIS — R293 Abnormal posture: Secondary | ICD-10-CM | POA: Insufficient documentation

## 2013-05-14 ENCOUNTER — Ambulatory Visit: Payer: Medicare Other | Admitting: Physician Assistant

## 2013-05-14 ENCOUNTER — Other Ambulatory Visit: Payer: Medicare Other

## 2013-05-16 ENCOUNTER — Encounter: Payer: Medicare Other | Admitting: Physical Therapy

## 2013-05-29 ENCOUNTER — Telehealth: Payer: Self-pay | Admitting: Oncology

## 2013-05-29 ENCOUNTER — Encounter: Payer: Self-pay | Admitting: Physician Assistant

## 2013-05-29 ENCOUNTER — Ambulatory Visit (HOSPITAL_BASED_OUTPATIENT_CLINIC_OR_DEPARTMENT_OTHER): Payer: Medicare Other | Admitting: Physician Assistant

## 2013-05-29 ENCOUNTER — Other Ambulatory Visit (HOSPITAL_BASED_OUTPATIENT_CLINIC_OR_DEPARTMENT_OTHER): Payer: Medicare Other

## 2013-05-29 VITALS — BP 174/84 | HR 71 | Temp 98.3°F | Resp 18 | Ht 67.0 in | Wt 232.5 lb

## 2013-05-29 DIAGNOSIS — C50411 Malignant neoplasm of upper-outer quadrant of right female breast: Secondary | ICD-10-CM

## 2013-05-29 DIAGNOSIS — Z1231 Encounter for screening mammogram for malignant neoplasm of breast: Secondary | ICD-10-CM

## 2013-05-29 DIAGNOSIS — Z853 Personal history of malignant neoplasm of breast: Secondary | ICD-10-CM

## 2013-05-29 DIAGNOSIS — M792 Neuralgia and neuritis, unspecified: Secondary | ICD-10-CM

## 2013-05-29 DIAGNOSIS — Z780 Asymptomatic menopausal state: Secondary | ICD-10-CM

## 2013-05-29 DIAGNOSIS — R071 Chest pain on breathing: Secondary | ICD-10-CM

## 2013-05-29 DIAGNOSIS — Z170 Estrogen receptor positive status [ER+]: Secondary | ICD-10-CM

## 2013-05-29 DIAGNOSIS — C50419 Malignant neoplasm of upper-outer quadrant of unspecified female breast: Secondary | ICD-10-CM

## 2013-05-29 DIAGNOSIS — M2550 Pain in unspecified joint: Secondary | ICD-10-CM

## 2013-05-29 LAB — CBC WITH DIFFERENTIAL/PLATELET
BASO%: 0.7 % (ref 0.0–2.0)
BASOS ABS: 0 10*3/uL (ref 0.0–0.1)
EOS%: 3.5 % (ref 0.0–7.0)
Eosinophils Absolute: 0.2 10*3/uL (ref 0.0–0.5)
HEMATOCRIT: 34.3 % — AB (ref 34.8–46.6)
HGB: 11.1 g/dL — ABNORMAL LOW (ref 11.6–15.9)
LYMPH%: 28.3 % (ref 14.0–49.7)
MCH: 29.2 pg (ref 25.1–34.0)
MCHC: 32.3 g/dL (ref 31.5–36.0)
MCV: 90.4 fL (ref 79.5–101.0)
MONO#: 0.5 10*3/uL (ref 0.1–0.9)
MONO%: 7.4 % (ref 0.0–14.0)
NEUT#: 3.8 10*3/uL (ref 1.5–6.5)
NEUT%: 60.1 % (ref 38.4–76.8)
PLATELETS: 205 10*3/uL (ref 145–400)
RBC: 3.79 10*6/uL (ref 3.70–5.45)
RDW: 14.4 % (ref 11.2–14.5)
WBC: 6.3 10*3/uL (ref 3.9–10.3)
lymph#: 1.8 10*3/uL (ref 0.9–3.3)

## 2013-05-29 MED ORDER — NAPROXEN 500 MG PO TABS: 500.0000 mg | ORAL_TABLET | Freq: Three times a day (TID) | ORAL | Status: DC

## 2013-05-29 MED ORDER — TRAMADOL HCL 50 MG PO TABS: 50.0000 mg | ORAL_TABLET | Freq: Four times a day (QID) | ORAL | Status: DC | PRN

## 2013-05-29 NOTE — Telephone Encounter (Signed)
gv pt appt schedule for sept and mammo for 5/1 @ [facility]. per 4/22 pof pt needs next available mammo and bone density test @ [facility]. pt previous bone density tests were done @ Dr. Roland Earl office and pt will need to retun to that office for test. [facility] @ office [phone]) and [facility] w/pt info requesting appt for bone density test and that office call pt w/appt. pt is aware to f/u w/r. Shelton's office re bone density if she does not hear from the.m.
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
## 2013-06-07 ENCOUNTER — Ambulatory Visit
Admission: RE | Admit: 2013-06-07 | Discharge: 2013-06-07 | Disposition: A | Discharge status: Home / Self Care | Payer: Medicare Other | Source: Ambulatory Visit | Attending: Physician Assistant | Admitting: Physician Assistant

## 2013-06-07 DIAGNOSIS — Z1231 Encounter for screening mammogram for malignant neoplasm of breast: Secondary | ICD-10-CM

## 2013-07-31 ENCOUNTER — Other Ambulatory Visit: Payer: Self-pay | Admitting: Physician Assistant

## 2013-08-05 ENCOUNTER — Other Ambulatory Visit: Payer: Self-pay | Admitting: Physician Assistant

## 2013-08-05 DIAGNOSIS — C50919 Malignant neoplasm of unspecified site of unspecified female breast: Secondary | ICD-10-CM

## 2013-09-09 ENCOUNTER — Encounter: Payer: Self-pay | Admitting: Neurology

## 2013-09-12 ENCOUNTER — Encounter: Payer: Self-pay | Admitting: Neurology

## 2013-09-12 ENCOUNTER — Ambulatory Visit (INDEPENDENT_AMBULATORY_CARE_PROVIDER_SITE_OTHER): Payer: Medicare Other | Admitting: Neurology

## 2013-09-12 VITALS — BP 149/77 | HR 62 | Resp 17 | Ht 67.0 in | Wt 234.0 lb

## 2013-09-12 DIAGNOSIS — G4730 Sleep apnea, unspecified: Secondary | ICD-10-CM

## 2013-09-12 DIAGNOSIS — G4700 Insomnia, unspecified: Secondary | ICD-10-CM | POA: Insufficient documentation

## 2013-09-12 DIAGNOSIS — R0609 Other forms of dyspnea: Secondary | ICD-10-CM

## 2013-09-12 DIAGNOSIS — R0989 Other specified symptoms and signs involving the circulatory and respiratory systems: Secondary | ICD-10-CM

## 2013-09-12 DIAGNOSIS — R0683 Snoring: Secondary | ICD-10-CM | POA: Insufficient documentation

## 2013-09-12 NOTE — Progress Notes (Addendum)
SLEEP MEDICINE CLINIC   Provider:  Larey Seat, M D  Referring Provider: Willey Blade, MD Primary Care Physician:  Salena Saner., MD  Chief Complaint  Patient presents with  . New Evaluation    Room 11  . Sleep consult    HPI:  Carrie Key is a 73 y.o. female ,who is seen here as a referral  from Dr. Karlton Lemon / NP Priscille Loveless for a  Pre-sleep apnea test  onsultation, We are meeting  here today for evaluation of excessive daytime sleepiness in the setting of hypertension, and elevated body mass index, and the medical history of breast cancer and chronic narcotic pain medication therapy. The patient has hypercholesterolemia, osteoarthritis, diabetes type 2.  The patient's bedtime is between 10 and 11 PM and she falls asleep at 3 or 4 AM. She wakes again about 6 AM and needs 2 hours to go back to sleep , waking again  between 9 and 10 . The patient reports a total sleep time of only 2-3 hours . She rises at 10 , being awake without alarm clock being set. She cannot sleep on the right side since mastectomy.   She can not to ell me why she stays in bed for 12 hours, she just " doesn't rise and watches TV, she told me - TV in bed. The bedroom is cool,but  not quiet and not dark. Breakfast takes place at 10. 30, coffee 2 cups, she doesn't leave the house daily, on some days she will not go outside and is not exposed to natural sun light. No exercise regimen in place.  She takes up to 4 naps a day with 30 minutes average duration. She had onset of sleepiness in October after breast cancer surgery. She sees a sleepiness increase in  response to her medication , namely listed below.   The patient's labs were reviewed from Dr. Roland Earl office.    The patient appears psychomotor slowed, she arrived in the office and was checked in, but it took her 45 minutes to get the paperwork done, she has trouble hearing and often answered unspecific or evasive She has no insight into sleep  hygiene.  No history of COPD, but former smoker. No ETOH , moderate caffeine use . 2 glasses of Tea , one cup of coffee. Tramadol is her pain medication now.   No family history of sleep apnea or other sleep disorder.     Review of Systems: Out of a complete 14 system review, the patient complains of only the following symptoms, and all other reviewed systems are negative. Epworth  15 , FSS 31   G- depression score 4 points. The patient's chief complaint is indicated under I cannot sleep more than 2 hours at night. She describes chills, fatigue, blurred vision, easy bruising, weakness, feeling hot and cold, having increased thirst, snoring, wheezing, shortness of breath heart palpitations and swelling in legs. Cramps ,aching joints,  Pain,  allergies and a runny nose , skin itching , insomnia and restless legs. Snoring is and/or stool times.    History   Social History  . Marital Status: Widowed    Spouse Name: N/A    Number of Children: 20  . Years of Education: 11   Occupational History  . Retired     disability prior to retirement   Social History Main Topics  . Smoking status: Former Smoker -- 0.30 packs/day for 30 years    Types: Cigarettes    Quit date: 11/26/1988  .  Smokeless tobacco: Never Used  . Alcohol Use: 2.4 oz/week    4 Glasses of wine per week     Comment: occ  . Drug Use: No  . Sexual Activity: Yes    Birth Control/ Protection: Post-menopausal   Other Topics Concern  . Not on file   Social History Narrative   Patient is single and lives alone.   Patient is retired.   Patient has five adult children.   Patient has a 11 grade education   Patient is right-handed.   Patient drinks one cup of coffee and two cups of tea daily.    Family History  Problem Relation Age of Onset  . Hypertension Father   . Hypertension Mother   . Hypertension Paternal Grandmother     Past Medical History  Diagnosis Date  . Breast cancer   . Diabetes mellitus without  complication   . Hypertension   . GERD (gastroesophageal reflux disease)   . Hyperlipemia   . Wears glasses   . Cancer     Past Surgical History  Procedure Laterality Date  . Abdominal hysterectomy    . Colonoscopy    . Upper gi endoscopy    . Back surgery  2002    lumbar lam  . Cardiac catheterization  2003  . Simple mastectomy with axillary sentinel node biopsy Right 12/03/2012    Procedure: RIGHT SIMPLE MASTECTOMY WITH RIGHT  AXILLARY SENTINEL LYMPH NODE BIOPSY;  Surgeon: Shann Medal, MD;  Location: [facility];  Service: General;  Laterality: Right;    Current Outpatient Prescriptions  Medication Sig Dispense Refill  . amLODipine (NORVASC) 10 MG tablet Take 10 mg by mouth daily.      Marland Kitchen anastrozole (ARIMIDEX) 1 MG tablet TAKE ONE TABLET BY MOUTH ONCE DAILY  30 tablet  3  . cloNIDine (CATAPRES) 0.3 MG tablet Take 0.3 mg by mouth 2 (two) times daily.      Marland Kitchen docusate sodium (COLACE) 100 MG capsule Take 200 mg by mouth 2 (two) times daily as needed for mild constipation.      . gabapentin (NEURONTIN) 300 MG capsule Take 1 capsule (300 mg total) by mouth 3 (three) times daily.  30 capsule  3  . metFORMIN (GLUCOPHAGE) 500 MG tablet Take 500 mg by mouth daily with breakfast.      . olmesartan-hydrochlorothiazide (BENICAR HCT) 40-25 MG per tablet Take 1 tablet by mouth daily.      Marland Kitchen omeprazole (PRILOSEC) 40 MG capsule Take 40 mg by mouth 2 (two) times daily.      . rosuvastatin (CRESTOR) 10 MG tablet Take 10 mg by mouth daily.       No current facility-administered medications for this visit.    Allergies as of 09/12/2013  . (No Known Allergies)    Vitals: BP 149/77  Pulse 62  Resp 17  Ht 5\' 7"  (1.702 m)  Wt 234 lb (106.142 kg)  BMI 36.64 kg/m2 Last Weight:  Wt Readings from Last 1 Encounters:  09/12/13 234 lb (106.142 kg)       Last Height:   Ht Readings from Last 1 Encounters:  09/12/13 5\' 7"  (1.702 m)    Physical exam:  General: The patient is  awake, alert and appears not in acute distress. The patient is well groomed. Head: Normocephalic, atraumatic. Neck is supple. Mallampati 4 neck circumference:17 . Nasal airflow unrestricted , TMJ is  Not  evident . Retrognathia is  Not seen.  Cardiovascular:  Regular rate  and rhythm , without  murmurs or carotid bruit, and without distended neck veins. Respiratory: Lungs are clear to auscultation. Skin:  Without evidence of edema, or rash Trunk: BMI is elevated and patient  has normal posture.  Neurologic exam : The patient is awake but appears sleepy , oriented to place and time.   Memory subjective  described as intact- which is not my impression . There is a normal attention span & concentration ability. Speech is fluent with dysarthria, dysphonia  Mood and affect are appropriate.  Cranial nerves: Pupils are equal and briskly reactive to light. Funduscopic exam without  evidence of pallor or edema.  Extraocular movements  in vertical and horizontal planes intact and without nystagmus. Visual fields by finger perimetry are intact. Hearing to finger rub intact.  Facial sensation intact to fine touch. Facial motor strength is symmetric and tongue (uvula is not seen )midline.  Motor exam: Normal tone ,muscle bulk and symmetric ,strength in all extremities. Right hand grip strength is preserved, the is edema over the right upper arm.  Sensory:  Fine touch, pinprick and vibration were present in both hands , but reduced at the ankles.  Coordination: Rapid alternating movements in the fingers/hands is normal. Gait and station: Patient walks without assistive device  Deep tendon reflexes: in the  upper and lower extremities are attenuated.    Assessment:  After physical and neurologic examination, review of laboratory studies, imaging, neurophysiology testing and pre-existing records, assessment is  1)  patient is indeed at high risk of OSA ,  but her chief problem is chronic insomnia and poor  sleep hygiene, which were discussed at length. She does not use sleep aids.  2)  the patient appears slowed and depressed, but subjectively doesn't rate herself that way.     The patient was advised of the nature of the diagnosed sleep disorder , the treatment options and risks for general a health and wellness arising from not treating the condition. Visit duration was over 60 minutes.   Plan:  Treatment plan and additional workup : SPLIT night study , patient will have to bring her clonidine with her and take in the lab. CO2 measures , if tolerated. AHI 20, score at 4% .   Asencion Partridge Brittanee Ghazarian MD  09/12/2013

## 2013-09-12 NOTE — Patient Instructions (Signed)
Insomnia Insomnia is frequent trouble falling and/or staying asleep. Insomnia can be a long term problem or a short term problem. Both are common. Insomnia can be a short term problem when the wakefulness is related to a certain stress or worry. Long term insomnia is often related to ongoing stress during waking hours and/or poor sleeping habits. Overtime, sleep deprivation itself can make the problem worse. Every little thing feels more severe because you are overtired and your ability to cope is decreased. CAUSES   Stress, anxiety, and depression.  Poor sleeping habits.  Distractions such as TV in the bedroom.  Naps close to bedtime.  Engaging in emotionally charged conversations before bed.  Technical reading before sleep.  Alcohol and other sedatives. They may make the problem worse. They can hurt normal sleep patterns and normal dream activity.  Stimulants such as caffeine for several hours prior to bedtime.  Pain syndromes and shortness of breath can cause insomnia.  Exercise late at night.  Changing time zones may cause sleeping problems (jet lag). It is sometimes helpful to have someone observe your sleeping patterns. They should look for periods of not breathing during the night (sleep apnea). They should also look to see how long those periods last. If you live alone or observers are uncertain, you can also be observed at a sleep clinic where your sleep patterns will be professionally monitored. Sleep apnea requires a checkup and treatment. Give your caregivers your medical history. Give your caregivers observations your family has made about your sleep.  SYMPTOMS   Not feeling rested in the morning.  Anxiety and restlessness at bedtime.  Difficulty falling and staying asleep. TREATMENT   Your caregiver may prescribe treatment for an underlying medical disorders. Your caregiver can give advice or help if you are using alcohol or other drugs for self-medication. Treatment  of underlying problems will usually eliminate insomnia problems.  Medications can be prescribed for short time use. They are generally not recommended for lengthy use.  Over-the-counter sleep medicines are not recommended for lengthy use. They can be habit forming.  You can promote easier sleeping by making lifestyle changes such as:  Using relaxation techniques that help with breathing and reduce muscle tension.  Exercising earlier in the day.  Changing your diet and the time of your last meal. No night time snacks.  Establish a regular time to go to bed.  Counseling can help with stressful problems and worry.  Soothing music and white noise may be helpful if there are background noises you cannot remove.  Stop tedious detailed work at least one hour before bedtime. HOME CARE INSTRUCTIONS   Keep a diary. Inform your caregiver about your progress. This includes any medication side effects. See your caregiver regularly. Take note of:  Times when you are asleep.  Times when you are awake during the night.  The quality of your sleep.  How you feel the next day. This information will help your caregiver care for you.  Get out of bed if you are still awake after 15 minutes. Read or do some quiet activity. Keep the lights down. Wait until you feel sleepy and go back to bed.  Keep regular sleeping and waking hours. Avoid naps.  Exercise regularly.  Avoid distractions at bedtime. Distractions include watching television or engaging in any intense or detailed activity like attempting to balance the household checkbook.  Develop a bedtime ritual. Keep a familiar routine of bathing, brushing your teeth, climbing into bed at the same   time each night, listening to soothing music. Routines increase the success of falling to sleep faster.  Use relaxation techniques. This can be using breathing and muscle tension release routines. It can also include visualizing peaceful scenes. You can  also help control troubling or intruding thoughts by keeping your mind occupied with boring or repetitive thoughts like the old concept of counting sheep. You can make it more creative like imagining planting one beautiful flower after another in your backyard garden.  During your day, work to eliminate stress. When this is not possible use some of the previous suggestions to help reduce the anxiety that accompanies stressful situations. MAKE SURE YOU:   Understand these instructions.  Will watch your condition.  Will get help right away if you are not doing well or get worse. Document Released: 01/22/2000 Document Revised: 04/18/2011 Document Reviewed: 02/21/2007 ExitCare Patient Information 2015 [employer]. This information is not intended to replace advice given to you by your health care provider. Make sure you discuss any questions you have with your health care provider.  

## 2013-09-26 ENCOUNTER — Ambulatory Visit (INDEPENDENT_AMBULATORY_CARE_PROVIDER_SITE_OTHER): Payer: Medicare Other | Admitting: Surgery

## 2013-09-30 ENCOUNTER — Other Ambulatory Visit: Payer: Self-pay | Admitting: Oncology

## 2013-09-30 DIAGNOSIS — C50419 Malignant neoplasm of upper-outer quadrant of unspecified female breast: Secondary | ICD-10-CM

## 2013-10-23 ENCOUNTER — Telehealth: Payer: Self-pay | Admitting: Oncology

## 2013-10-23 ENCOUNTER — Other Ambulatory Visit (HOSPITAL_BASED_OUTPATIENT_CLINIC_OR_DEPARTMENT_OTHER): Payer: Medicare Other

## 2013-10-23 DIAGNOSIS — Z853 Personal history of malignant neoplasm of breast: Secondary | ICD-10-CM

## 2013-10-23 DIAGNOSIS — C50419 Malignant neoplasm of upper-outer quadrant of unspecified female breast: Secondary | ICD-10-CM

## 2013-10-23 LAB — CBC WITH DIFFERENTIAL/PLATELET
BASO%: 0.9 % (ref 0.0–2.0)
Basophils Absolute: 0.1 10*3/uL (ref 0.0–0.1)
EOS ABS: 0.1 10*3/uL (ref 0.0–0.5)
EOS%: 1.5 % (ref 0.0–7.0)
HEMATOCRIT: 36.7 % (ref 34.8–46.6)
HGB: 11.7 g/dL (ref 11.6–15.9)
LYMPH%: 25.2 % (ref 14.0–49.7)
MCH: 29.6 pg (ref 25.1–34.0)
MCHC: 31.8 g/dL (ref 31.5–36.0)
MCV: 93.3 fL (ref 79.5–101.0)
MONO#: 0.3 10*3/uL (ref 0.1–0.9)
MONO%: 6 % (ref 0.0–14.0)
NEUT#: 3.7 10*3/uL (ref 1.5–6.5)
NEUT%: 66.4 % (ref 38.4–76.8)
PLATELETS: 237 10*3/uL (ref 145–400)
RBC: 3.94 10*6/uL (ref 3.70–5.45)
RDW: 13.7 % (ref 11.2–14.5)
WBC: 5.5 10*3/uL (ref 3.9–10.3)
lymph#: 1.4 10*3/uL (ref 0.9–3.3)

## 2013-10-23 LAB — COMPREHENSIVE METABOLIC PANEL (CC13)
ALT: 7 U/L (ref 0–55)
ANION GAP: 12 meq/L — AB (ref 3–11)
AST: 17 U/L (ref 5–34)
Albumin: 3.9 g/dL (ref 3.5–5.0)
Alkaline Phosphatase: 64 U/L (ref 40–150)
BILIRUBIN TOTAL: 0.44 mg/dL (ref 0.20–1.20)
BUN: 15.2 mg/dL (ref 7.0–26.0)
CO2: 25 meq/L (ref 22–29)
Calcium: 10.1 mg/dL (ref 8.4–10.4)
Chloride: 105 mEq/L (ref 98–109)
Creatinine: 1.4 mg/dL — ABNORMAL HIGH (ref 0.6–1.1)
GLUCOSE: 109 mg/dL (ref 70–140)
Potassium: 3.6 mEq/L (ref 3.5–5.1)
Sodium: 142 mEq/L (ref 136–145)
Total Protein: 8 g/dL (ref 6.4–8.3)

## 2013-10-23 NOTE — Telephone Encounter (Signed)
, °

## 2013-10-29 ENCOUNTER — Ambulatory Visit (HOSPITAL_BASED_OUTPATIENT_CLINIC_OR_DEPARTMENT_OTHER): Payer: Medicare Other | Admitting: Oncology

## 2013-10-29 ENCOUNTER — Telehealth: Payer: Self-pay | Admitting: Oncology

## 2013-10-29 VITALS — BP 164/76 | HR 62 | Temp 97.9°F | Resp 18 | Ht 67.0 in | Wt 228.5 lb

## 2013-10-29 DIAGNOSIS — E119 Type 2 diabetes mellitus without complications: Secondary | ICD-10-CM

## 2013-10-29 DIAGNOSIS — C50411 Malignant neoplasm of upper-outer quadrant of right female breast: Secondary | ICD-10-CM

## 2013-10-29 DIAGNOSIS — B370 Candidal stomatitis: Secondary | ICD-10-CM

## 2013-10-29 DIAGNOSIS — R110 Nausea: Secondary | ICD-10-CM

## 2013-10-29 DIAGNOSIS — C50419 Malignant neoplasm of upper-outer quadrant of unspecified female breast: Secondary | ICD-10-CM

## 2013-10-29 DIAGNOSIS — Z170 Estrogen receptor positive status [ER+]: Secondary | ICD-10-CM

## 2013-10-29 MED ORDER — FLUCONAZOLE 100 MG PO TABS
100.0000 mg | ORAL_TABLET | Freq: Every day | ORAL | Status: DC
Start: 2013-10-29 — End: 2017-05-07
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
## 2013-10-29 NOTE — Telephone Encounter (Signed)
per pof to sch pt appt-gave pt copy of sch °

## 2013-11-07 ENCOUNTER — Other Ambulatory Visit: Payer: Self-pay | Admitting: Oncology

## 2013-11-07 DIAGNOSIS — C50411 Malignant neoplasm of upper-outer quadrant of right female breast: Secondary | ICD-10-CM

## 2013-12-26 ENCOUNTER — Other Ambulatory Visit: Payer: Self-pay | Admitting: Oncology

## 2013-12-26 DIAGNOSIS — C50411 Malignant neoplasm of upper-outer quadrant of right female breast: Secondary | ICD-10-CM

## 2014-01-13 ENCOUNTER — Other Ambulatory Visit: Payer: Self-pay | Admitting: Oncology

## 2014-01-27 ENCOUNTER — Encounter (HOSPITAL_BASED_OUTPATIENT_CLINIC_OR_DEPARTMENT_OTHER): Payer: Medicare Other | Admitting: Oncology

## 2014-01-27 DIAGNOSIS — C50411 Malignant neoplasm of upper-outer quadrant of right female breast: Secondary | ICD-10-CM

## 2014-01-27 LAB — COMPREHENSIVE METABOLIC PANEL (CC13)
ALT: 11 U/L (ref 0–55)
AST: 13 U/L (ref 5–34)
Albumin: 3.5 g/dL (ref 3.5–5.0)
Alkaline Phosphatase: 55 U/L (ref 40–150)
Anion Gap: 9 mEq/L (ref 3–11)
BUN: 22.5 mg/dL (ref 7.0–26.0)
CALCIUM: 9.4 mg/dL (ref 8.4–10.4)
CHLORIDE: 104 meq/L (ref 98–109)
CO2: 27 mEq/L (ref 22–29)
CREATININE: 1.3 mg/dL — AB (ref 0.6–1.1)
EGFR: 49 mL/min/{1.73_m2} — ABNORMAL LOW (ref >90)
Glucose: 116 mg/dl (ref 70–140)
Potassium: 3.7 mEq/L (ref 3.5–5.1)
Sodium: 140 mEq/L (ref 136–145)
Total Bilirubin: 0.28 mg/dL (ref 0.20–1.20)
Total Protein: 6.9 g/dL (ref 6.4–8.3)

## 2014-01-27 LAB — CBC WITH DIFFERENTIAL/PLATELET
BASO%: 0.6 % (ref 0.0–2.0)
BASOS ABS: 0 10*3/uL (ref 0.0–0.1)
EOS%: 2.4 % (ref 0.0–7.0)
Eosinophils Absolute: 0.2 10*3/uL (ref 0.0–0.5)
HCT: 32.6 % — ABNORMAL LOW (ref 34.8–46.6)
HEMOGLOBIN: 10.7 g/dL — AB (ref 11.6–15.9)
LYMPH#: 1.8 10*3/uL (ref 0.9–3.3)
LYMPH%: 26.4 % (ref 14.0–49.7)
MCH: 29.6 pg (ref 25.1–34.0)
MCHC: 32.8 g/dL (ref 31.5–36.0)
MCV: 90.3 fL (ref 79.5–101.0)
MONO#: 0.4 10*3/uL (ref 0.1–0.9)
MONO%: 6.2 % (ref 0.0–14.0)
NEUT%: 64.4 % (ref 38.4–76.8)
NEUTROS ABS: 4.3 10*3/uL (ref 1.5–6.5)
Platelets: 224 10*3/uL (ref 145–400)
RBC: 3.61 10*6/uL — ABNORMAL LOW (ref 3.70–5.45)
RDW: 13.2 % (ref 11.2–14.5)
WBC: 6.6 10*3/uL (ref 3.9–10.3)

## 2014-02-03 ENCOUNTER — Encounter: Payer: Medicare Other | Admitting: Oncology

## 2014-02-03 ENCOUNTER — Telehealth: Payer: Self-pay | Admitting: Oncology

## 2014-02-03 NOTE — Telephone Encounter (Signed)
cld & spoke to pt to give time &date per pof-pt understood

## 2014-02-18 ENCOUNTER — Other Ambulatory Visit: Payer: Self-pay | Admitting: Emergency Medicine

## 2014-02-18 ENCOUNTER — Telehealth: Payer: Self-pay | Admitting: Nurse Practitioner

## 2014-02-18 NOTE — Telephone Encounter (Signed)
adv no openings-adv GM set that appt up-pt stated that he was going to discuss results-adv i could let her spk to nurse because GM will be out for several week on vac and no avail-t

## 2014-02-19 ENCOUNTER — Other Ambulatory Visit: Payer: Medicare Other

## 2014-02-19 ENCOUNTER — Ambulatory Visit: Payer: Medicare Other | Admitting: Nurse Practitioner

## 2014-02-19 ENCOUNTER — Telehealth: Payer: Self-pay | Admitting: Nurse Practitioner

## 2014-02-19 NOTE — Telephone Encounter (Signed)
, °

## 2014-02-24 ENCOUNTER — Other Ambulatory Visit: Payer: Self-pay | Admitting: Oncology

## 2014-02-28 ENCOUNTER — Other Ambulatory Visit: Payer: TRICARE For Life (TFL)

## 2014-02-28 ENCOUNTER — Ambulatory Visit: Payer: Self-pay | Admitting: Nurse Practitioner

## 2014-02-28 ENCOUNTER — Telehealth: Payer: Self-pay | Admitting: *Deleted

## 2014-02-28 NOTE — Telephone Encounter (Signed)
Because of inclement weather pt said she cannot come. I told her we will r/s her for follow-up appt. Message to be forwarded to [facility].

## 2014-03-05 ENCOUNTER — Other Ambulatory Visit: Payer: Self-pay | Admitting: *Deleted

## 2014-03-05 ENCOUNTER — Telehealth: Payer: Self-pay | Admitting: Nurse Practitioner

## 2014-03-05 ENCOUNTER — Ambulatory Visit (HOSPITAL_BASED_OUTPATIENT_CLINIC_OR_DEPARTMENT_OTHER): Payer: Medicare Other | Admitting: Nurse Practitioner

## 2014-03-05 ENCOUNTER — Other Ambulatory Visit (HOSPITAL_BASED_OUTPATIENT_CLINIC_OR_DEPARTMENT_OTHER): Payer: Medicare Other

## 2014-03-05 ENCOUNTER — Encounter: Payer: Self-pay | Admitting: Nurse Practitioner

## 2014-03-05 VITALS — BP 143/76 | HR 62 | Temp 98.1°F | Resp 18 | Ht 67.0 in | Wt 228.8 lb

## 2014-03-05 DIAGNOSIS — Z170 Estrogen receptor positive status [ER+]: Secondary | ICD-10-CM | POA: Diagnosis not present

## 2014-03-05 DIAGNOSIS — C9210 Chronic myeloid leukemia, BCR/ABL-positive, not having achieved remission: Secondary | ICD-10-CM

## 2014-03-05 DIAGNOSIS — R0789 Other chest pain: Secondary | ICD-10-CM | POA: Diagnosis not present

## 2014-03-05 DIAGNOSIS — C50411 Malignant neoplasm of upper-outer quadrant of right female breast: Secondary | ICD-10-CM

## 2014-03-05 DIAGNOSIS — Z853 Personal history of malignant neoplasm of breast: Secondary | ICD-10-CM

## 2014-03-05 LAB — CBC WITH DIFFERENTIAL/PLATELET
BASO%: 0.9 % (ref 0.0–2.0)
Basophils Absolute: 0 10*3/uL (ref 0.0–0.1)
EOS%: 2.6 % (ref 0.0–7.0)
Eosinophils Absolute: 0.1 10*3/uL (ref 0.0–0.5)
HCT: 33.7 % — ABNORMAL LOW (ref 34.8–46.6)
HEMOGLOBIN: 10.7 g/dL — AB (ref 11.6–15.9)
LYMPH%: 23.2 % (ref 14.0–49.7)
MCH: 29 pg (ref 25.1–34.0)
MCHC: 31.6 g/dL (ref 31.5–36.0)
MCV: 91.6 fL (ref 79.5–101.0)
MONO#: 0.3 10*3/uL (ref 0.1–0.9)
MONO%: 6.7 % (ref 0.0–14.0)
NEUT%: 66.6 % (ref 38.4–76.8)
NEUTROS ABS: 3.4 10*3/uL (ref 1.5–6.5)
Platelets: 232 10*3/uL (ref 145–400)
RBC: 3.68 10*6/uL — AB (ref 3.70–5.45)
RDW: 13.3 % (ref 11.2–14.5)
WBC: 5.2 10*3/uL (ref 3.9–10.3)
lymph#: 1.2 10*3/uL (ref 0.9–3.3)

## 2014-03-05 LAB — COMPREHENSIVE METABOLIC PANEL (CC13)
ALT: 11 U/L (ref 0–55)
ANION GAP: 12 meq/L — AB (ref 3–11)
AST: 15 U/L (ref 5–34)
Albumin: 3.6 g/dL (ref 3.5–5.0)
Alkaline Phosphatase: 63 U/L (ref 40–150)
BUN: 15.2 mg/dL (ref 7.0–26.0)
CHLORIDE: 106 meq/L (ref 98–109)
CO2: 24 meq/L (ref 22–29)
CREATININE: 1.1 mg/dL (ref 0.6–1.1)
Calcium: 9.3 mg/dL (ref 8.4–10.4)
EGFR: 59 mL/min/{1.73_m2} — ABNORMAL LOW (ref >90)
GLUCOSE: 126 mg/dL (ref 70–140)
POTASSIUM: 3.6 meq/L (ref 3.5–5.1)
SODIUM: 141 meq/L (ref 136–145)
Total Bilirubin: 0.48 mg/dL (ref 0.20–1.20)
Total Protein: 6.9 g/dL (ref 6.4–8.3)

## 2014-03-05 MED ORDER — TRAMADOL HCL 50 MG PO TABS: 50.0000 mg | ORAL_TABLET | Freq: Four times a day (QID) | ORAL | Status: DC | PRN

## 2014-03-05 MED ORDER — GABAPENTIN 300 MG PO CAPS: 300.0000 mg | ORAL_CAPSULE | Freq: Three times a day (TID) | ORAL | Status: DC

## 2014-03-05 NOTE — Telephone Encounter (Signed)
per pof to sch pt appt-[facility] couldnt get pof to go through-she adv to sch DEXA & mamma @ [facility]-sch appt w/GM in 25mths-cld & spoke to pt and gave pt appt time/date/location@ [facility]-gave appt time for GM-pt understood

## 2014-03-05 NOTE — Progress Notes (Addendum)
ID: Carrie Key OB: [birth year]  MR#: [id number]  CSN#:[id number]  PCP: Maximino Greenland, MD GYN:   SU: Alphonsa Overall OTHER MD: Thea Silversmith, Robert Buccini  CHIEF COMPLAINT: Right Breast Cancer CURRENT TREATMENT: Anastrozole  BREAST CANCER HISTORY: From the initial intake note:  Natale noted a lump in the upper-outer quadrant of her right breast and brought it to the attention of her primary care physician. She was set up for bilateral diagnostic mammography 10/31/2012 at [facility]. This showed her breast to be category A., almost entirely fatty. A 1.6 cm irregular high density mass with indistinct margins was noted in the right upper outer quadrant. This was palpable. Ultrasound of the right breast showed the mass to measure 1.5 cm, and to be lobulated. There was no axillary abnormality by ultrasound. Biopsy of the mass in question 11/01/2012 showed ([id number]) invasive ductal carcinoma, grade 1, estrogen and progesterone receptors both 100% positive. Biopsies obtained lateral and medial to this mass on the same day showed an identical morphology.  On 11/07/2012 the patient underwent bilateral breast MRI, which showed a total area of involvement measuring 13.2 cm, extending from the central to the upper lateral right breast. There were no abnormal appearing lymph nodes in the left breast was unremarkable.  Her subsequent history is as detailed below.   INTERVAL HISTORY: Carrie Key returns today for follow up of her breast cancer. She has been on anastrozole since December 2014. She is tolerating it moderately well. She denies vaginal changes but has significant hot flashes. She uses gabapentin, but has been out of this drug for some time. She has some arthralgias to her knees, but this this was present at baseline as arthritis. The interval history is unremarkable.   REVIEW OF SYSTEMS: Kynedi denies fevers or chills. She has a bowel movement about every other day and is voiding well. She some  episodes of nausea, that are "random." She will drink a glass of water and lay down and it resolved. She has occasional headaches and some dizziness at time, but she denies vision changes. She denies shortness of breath, chest pain, cough, or palpitations. She sleeps poorly frequently. She continues to have shooting pain in her right axilla, and takes tramadol for this. A detailed review of systems is otherwise stable.   PAST MEDICAL HISTORY: Past Medical History  Diagnosis Date  . Breast cancer   . Diabetes mellitus without complication   . Hypertension   . GERD (gastroesophageal reflux disease)   . Hyperlipemia   . Wears glasses   . Cancer     PAST SURGICAL HISTORY: Past Surgical History  Procedure Laterality Date  . Abdominal hysterectomy    . Colonoscopy    . Upper gi endoscopy    . Back surgery  2002    lumbar lam  . Cardiac catheterization  2003  . Simple mastectomy with axillary sentinel node biopsy Right 12/03/2012    Procedure: RIGHT SIMPLE MASTECTOMY WITH RIGHT  AXILLARY SENTINEL LYMPH NODE BIOPSY;  Surgeon: Shann Medal, MD;  Location: [facility];  Service: General;  Laterality: Right;    FAMILY HISTORY  (updated 01/07/2013) Family History  Problem Relation Age of Onset  . Hypertension Father   . Hypertension Mother   . Hypertension Paternal Grandmother    The patient's father died at the age of 8 from a myocardial infarction. The patient's mother died at the age of 70 from a stroke. The patient has 5 brothers, 4 sisters. There is no history of  breast or ovarian cancer in the family  GYNECOLOGIC HISTORY:   (Updated 01/07/2013) Menarche age 61, first live birth age 53. The patient is GX P6. She stopped having periods in 1990. She is status post total abdominal hysterectomy with bilateral salpingo-oophorectomy. She never took hormone replacement.  SOCIAL HISTORY:   (Updated 01/07/2013) Veleda worked in [employer] at [employer] but  retired 2000. She lives by herself, with no pets, although her friend and significant other, [name], visits daily. She has one son who died from a stroke at age 16. Son [name] works for [employer] in [place]. Daughter [name] works in a [employer] in [place]. Son [name] lives in [place], and is currently unemployed. Son [name] lives in [place] and [place]. The patient has 11 grandchildren. She attends [employer]    ADVANCED DIRECTIVES: Not in place   HEALTH MAINTENANCE:  (Updated 01/07/2013) History  Substance Use Topics  . Smoking status: Former Smoker -- 0.30 packs/day for 30 years    Types: Cigarettes    Quit date: 11/26/1988  . Smokeless tobacco: Never Used  . Alcohol Use: 2.4 oz/week    4 Glasses of wine per week     Comment: occ     Colonoscopy: 2013  PAP: Status post hysterectomy  Bone density: Not on file  Lipid panel:  Dr. Karlton Lemon  No Known Allergies  Current Outpatient Prescriptions  Medication Sig Dispense Refill  . amLODipine (NORVASC) 10 MG tablet Take 10 mg by mouth daily.    . metFORMIN (GLUCOPHAGE) 500 MG tablet Take 500 mg by mouth daily with breakfast.    . anastrozole (ARIMIDEX) 1 MG tablet TAKE ONE TABLET BY MOUTH ONCE DAILY 30 tablet 0  . cloNIDine (CATAPRES) 0.3 MG tablet Take 0.3 mg by mouth 2 (two) times daily.    Marland Kitchen docusate sodium (COLACE) 100 MG capsule Take 200 mg by mouth 2 (two) times daily as needed for mild constipation.    . fluconazole (DIFLUCAN) 100 MG tablet Take 1 tablet (100 mg total) by mouth daily. 30 tablet 3  . gabapentin (NEURONTIN) 300 MG capsule Take 1 capsule (300 mg total) by mouth 3 (three) times daily. 30 capsule 3  . olmesartan-hydrochlorothiazide (BENICAR HCT) 40-25 MG per tablet Take 1 tablet by mouth daily.    Marland Kitchen omeprazole (PRILOSEC) 40 MG capsule Take 40 mg by mouth 2 (two) times daily.    . rosuvastatin (CRESTOR) 10 MG tablet Take 10 mg by mouth daily.    .  traMADol (ULTRAM) 50 MG tablet TAKE ONE TO TWO TABLETS BY MOUTH EVERY 6 HOURS AS NEEDED FOR PAIN 90 tablet 1   No current facility-administered medications for this visit.    OBJECTIVE: Older African American woman who appears stated age 74 Vitals:   03/05/14 0903  BP: 143/76  Pulse: 62  Temp: 98.1 F (36.7 C)  Resp: 18     Body mass index is 35.83 kg/(m^2).    ECOG FS: 1 Filed Weights   03/05/14 0903  Weight: 228 lb 12.8 oz (103.783 kg)   Skin: warm, dry  HEENT: sclerae anicteric, conjunctivae pink, oropharynx clear. No thrush or mucositis.  Lymph Nodes: No cervical or supraclavicular lymphadenopathy  Lungs: clear to auscultation bilaterally, no rales, wheezes, or rhonci  Heart: regular rate and rhythm  Abdomen: round, soft, non tender, positive bowel sounds  Musculoskeletal: No focal spinal tenderness, +2 edema to bilateral ankles L>R Neuro: non focal, well oriented,  positive affect  Breasts: Right breast status post mastectomy. No evidence of local recurrence. Right axilla benign. Left breast unremarkable.   LAB RESULTS:  Lab Results  Component Value Date   WBC 5.2 03/05/2014   NEUTROABS 3.4 03/05/2014   HGB 10.7* 03/05/2014   HCT 33.7* 03/05/2014   MCV 91.6 03/05/2014   PLT 232 03/05/2014      Chemistry      Component Value Date/Time   NA 140 01/27/2014 1026   NA 139 05/21/2008 0424   K 3.7 01/27/2014 1026   K 3.8 05/21/2008 0424   CL 102 05/21/2008 0424   CO2 27 01/27/2014 1026   BUN 22.5 01/27/2014 1026   BUN 20 05/21/2008 0424   CREATININE 1.3* 01/27/2014 1026   CREATININE 1.2 05/21/2008 0424      Component Value Date/Time   CALCIUM 9.4 01/27/2014 1026   ALKPHOS 55 01/27/2014 1026   AST 13 01/27/2014 1026   ALT 11 01/27/2014 1026   BILITOT 0.28 01/27/2014 1026       STUDIES: No results found.  Patient is due for her next left mammogram as well as a bone density, and both be scheduled for next available appointment.  ASSESSMENT: 74 y.o.  [place] woman status post right breast biopsy at 3 separate sites in the right breast, all 3 sites showing ductal carcinoma in situ, low-grade, estrogen and progesterone receptors both 100% positive  (1) Status post right mastectomy and sentinel lymph node sampling 12/03/2012 for a pT3 pN1, stage IIIA invasive ductal carcinoma, estrogen receptor 99% positive, progesterone receptor 96% positive, with an MIB-1 of 9% and no HER-2 amplification.  (2) Oncotype DX score of 6 predicts a distant recurrence risk of 8% within the next 10 years if the patient's only systemic treatment is tamoxifen for 5 years. It also predicts no benefit from chemotherapy  (3)  began anastrozole, 01/07/2013,  to continue for 5 years.  PLAN: Hiromi is doing well today. The labs were reviewed in detail and were stable. She is tolerating the anastrozole well, and the plan is to continue for at least 5 years of antiestrogen therapy.   I am refilling her tramadol and gabapentin for her right chest wall pain.   Jaidy is overdue for her left mammogram and bone density scan, and I have placed orders for both of these to be performed at [facility].   Chaquetta will return for labs and an office visit in 3 months to review the results of these tests. She understands and agrees with this plan. She knows the goal of treatment in her case is cure. She has been encouraged to call with any issues that might arise before her next visit here.   Marcelino Duster, NP   03/05/2014 9:09 AM

## 2014-04-02 DIAGNOSIS — I10 Essential (primary) hypertension: Secondary | ICD-10-CM | POA: Diagnosis not present

## 2014-04-02 DIAGNOSIS — Z79899 Other long term (current) drug therapy: Secondary | ICD-10-CM | POA: Diagnosis not present

## 2014-04-03 ENCOUNTER — Other Ambulatory Visit: Payer: Self-pay | Admitting: Oncology

## 2014-04-07 ENCOUNTER — Other Ambulatory Visit: Payer: Self-pay | Admitting: Oncology

## 2014-04-14 DIAGNOSIS — E119 Type 2 diabetes mellitus without complications: Secondary | ICD-10-CM | POA: Diagnosis not present

## 2014-05-23 ENCOUNTER — Other Ambulatory Visit: Payer: Self-pay | Admitting: Nurse Practitioner

## 2014-05-29 ENCOUNTER — Other Ambulatory Visit: Payer: Self-pay | Admitting: Nurse Practitioner

## 2014-06-10 ENCOUNTER — Other Ambulatory Visit: Payer: Medicare Other

## 2014-06-10 ENCOUNTER — Ambulatory Visit: Payer: Medicare Other | Admitting: Oncology

## 2014-06-10 NOTE — Progress Notes (Signed)
n show

## 2014-06-13 ENCOUNTER — Other Ambulatory Visit: Payer: Self-pay | Admitting: Oncology

## 2014-06-18 NOTE — Telephone Encounter (Signed)
Called pt to let her know the Anastrozole will be refilled today, Disp-30, Refill-1. Pt was not able to come to appt with Dr. Jana Hakim on 06/10/14 b/c she was out of town. I told her we will be rescheduling the appt and to expect a call soon. Pt verbalized understanding.No further concerns.

## 2014-06-21 ENCOUNTER — Emergency Department (HOSPITAL_COMMUNITY): Payer: Medicare Other

## 2014-06-21 ENCOUNTER — Emergency Department (HOSPITAL_COMMUNITY)
Admission: EM | Admit: 2014-06-21 | Discharge: 2014-06-21 | Disposition: A | Discharge status: Home / Self Care | Payer: Medicare Other | Attending: Emergency Medicine | Admitting: Emergency Medicine

## 2014-06-21 ENCOUNTER — Encounter (HOSPITAL_COMMUNITY): Payer: Self-pay | Admitting: Family Medicine

## 2014-06-21 DIAGNOSIS — R109 Unspecified abdominal pain: Secondary | ICD-10-CM | POA: Insufficient documentation

## 2014-06-21 DIAGNOSIS — K219 Gastro-esophageal reflux disease without esophagitis: Secondary | ICD-10-CM | POA: Diagnosis not present

## 2014-06-21 DIAGNOSIS — Z79899 Other long term (current) drug therapy: Secondary | ICD-10-CM | POA: Insufficient documentation

## 2014-06-21 DIAGNOSIS — Z859 Personal history of malignant neoplasm, unspecified: Secondary | ICD-10-CM | POA: Diagnosis not present

## 2014-06-21 DIAGNOSIS — E119 Type 2 diabetes mellitus without complications: Secondary | ICD-10-CM | POA: Insufficient documentation

## 2014-06-21 DIAGNOSIS — Z87891 Personal history of nicotine dependence: Secondary | ICD-10-CM | POA: Insufficient documentation

## 2014-06-21 DIAGNOSIS — Z853 Personal history of malignant neoplasm of breast: Secondary | ICD-10-CM | POA: Diagnosis not present

## 2014-06-21 DIAGNOSIS — E785 Hyperlipidemia, unspecified: Secondary | ICD-10-CM | POA: Diagnosis not present

## 2014-06-21 DIAGNOSIS — R05 Cough: Secondary | ICD-10-CM | POA: Diagnosis not present

## 2014-06-21 DIAGNOSIS — R059 Cough, unspecified: Secondary | ICD-10-CM

## 2014-06-21 DIAGNOSIS — J069 Acute upper respiratory infection, unspecified: Secondary | ICD-10-CM | POA: Diagnosis not present

## 2014-06-21 DIAGNOSIS — I10 Essential (primary) hypertension: Secondary | ICD-10-CM | POA: Diagnosis not present

## 2014-06-21 LAB — URINALYSIS, ROUTINE W REFLEX MICROSCOPIC
BILIRUBIN URINE: NEGATIVE
Glucose, UA: NEGATIVE mg/dL
HGB URINE DIPSTICK: NEGATIVE
KETONES UR: NEGATIVE mg/dL
Nitrite: NEGATIVE
PH: 5 (ref 5.0–8.0)
PROTEIN: NEGATIVE mg/dL
Specific Gravity, Urine: 1.021 (ref 1.005–1.030)
Urobilinogen, UA: 1 mg/dL (ref 0.0–1.0)

## 2014-06-21 LAB — BASIC METABOLIC PANEL
ANION GAP: 9 (ref 5–15)
BUN: 23 mg/dL — AB (ref 6–20)
CALCIUM: 9.5 mg/dL (ref 8.9–10.3)
CHLORIDE: 104 mmol/L (ref 101–111)
CO2: 26 mmol/L (ref 22–32)
Creatinine, Ser: 1.43 mg/dL — ABNORMAL HIGH (ref 0.44–1.00)
GFR calc Af Amer: 41 mL/min — ABNORMAL LOW (ref >60)
GFR calc non Af Amer: 36 mL/min — ABNORMAL LOW (ref >60)
Glucose, Bld: 120 mg/dL — ABNORMAL HIGH (ref 65–99)
Potassium: 3.1 mmol/L — ABNORMAL LOW (ref 3.5–5.1)
SODIUM: 139 mmol/L (ref 135–145)

## 2014-06-21 LAB — CBC
HCT: 34.2 % — ABNORMAL LOW (ref 36.0–46.0)
Hemoglobin: 11.3 g/dL — ABNORMAL LOW (ref 12.0–15.0)
MCH: 29.4 pg (ref 26.0–34.0)
MCHC: 33 g/dL (ref 30.0–36.0)
MCV: 89.1 fL (ref 78.0–100.0)
Platelets: 229 10*3/uL (ref 150–400)
RBC: 3.84 MIL/uL — AB (ref 3.87–5.11)
RDW: 13.8 % (ref 11.5–15.5)
WBC: 5.9 10*3/uL (ref 4.0–10.5)

## 2014-06-21 LAB — URINE MICROSCOPIC-ADD ON

## 2014-06-21 NOTE — Discharge Instructions (Signed)
Upper Respiratory Infection, Adult An upper respiratory infection (URI) is also sometimes known as the common cold. The upper respiratory tract includes the nose, sinuses, throat, trachea, and bronchi. Bronchi are the airways leading to the lungs. Most people improve within 1 week, but symptoms can last up to 2 weeks. A residual cough may last even longer.  CAUSES Many different viruses can infect the tissues lining the upper respiratory tract. The tissues become irritated and inflamed and often become very moist. Mucus production is also common. A cold is contagious. You can easily spread the virus to others by oral contact. This includes kissing, sharing a glass, coughing, or sneezing. Touching your mouth or nose and then touching a surface, which is then touched by another person, can also spread the virus. SYMPTOMS  Symptoms typically develop 1 to 3 days after you come in contact with a cold virus. Symptoms vary from person to person. They may include:  Runny nose.  Sneezing.  Nasal congestion.  Sinus irritation.  Sore throat.  Loss of voice (laryngitis).  Cough.  Fatigue.  Muscle aches.  Loss of appetite.  Headache.  Low-grade fever. DIAGNOSIS  You might diagnose your own cold based on familiar symptoms, since most people get a cold 2 to 3 times a year. Your caregiver can confirm this based on your exam. Most importantly, your caregiver can check that your symptoms are not due to another disease such as strep throat, sinusitis, pneumonia, asthma, or epiglottitis. Blood tests, throat tests, and X-rays are not necessary to diagnose a common cold, but they may sometimes be helpful in excluding other more serious diseases. Your caregiver will decide if any further tests are required. RISKS AND COMPLICATIONS  You may be at risk for a more severe case of the common cold if you smoke cigarettes, have chronic heart disease (such as heart failure) or lung disease (such as asthma), or if  you have a weakened immune system. The very young and very old are also at risk for more serious infections. Bacterial sinusitis, middle ear infections, and bacterial pneumonia can complicate the common cold. The common cold can worsen asthma and chronic obstructive pulmonary disease (COPD). Sometimes, these complications can require emergency medical care and may be life-threatening. PREVENTION  The best way to protect against getting a cold is to practice good hygiene. Avoid oral or hand contact with people with cold symptoms. Wash your hands often if contact occurs. There is no clear evidence that vitamin C, vitamin E, echinacea, or exercise reduces the chance of developing a cold. However, it is always recommended to get plenty of rest and practice good nutrition. TREATMENT  Treatment is directed at relieving symptoms. There is no cure. Antibiotics are not effective, because the infection is caused by a virus, not by bacteria. Treatment may include:  Increased fluid intake. Sports drinks offer valuable electrolytes, sugars, and fluids.  Breathing heated mist or steam (vaporizer or shower).  Eating chicken soup or other clear broths, and maintaining good nutrition.  Getting plenty of rest.  Using gargles or lozenges for comfort.  Controlling fevers with ibuprofen or acetaminophen as directed by your caregiver.  Increasing usage of your inhaler if you have asthma. Zinc gel and zinc lozenges, taken in the first 24 hours of the common cold, can shorten the duration and lessen the severity of symptoms. Pain medicines may help with fever, muscle aches, and throat pain. A variety of non-prescription medicines are available to treat congestion and runny nose. Your caregiver   can make recommendations and may suggest nasal or lung inhalers for other symptoms.  HOME CARE INSTRUCTIONS   Only take over-the-counter or prescription medicines for pain, discomfort, or fever as directed by your  caregiver.  Use a warm mist humidifier or inhale steam from a shower to increase air moisture. This may keep secretions moist and make it easier to breathe.  Drink enough water and fluids to keep your urine clear or pale yellow.  Rest as needed.  Return to work when your temperature has returned to normal or as your caregiver advises. You may need to stay home longer to avoid infecting others. You can also use a face mask and careful hand washing to prevent spread of the virus. SEEK MEDICAL CARE IF:   After the first few days, you feel you are getting worse rather than better.  You need your caregiver's advice about medicines to control symptoms.  You develop chills, worsening shortness of breath, or brown or red sputum. These may be signs of pneumonia.  You develop yellow or brown nasal discharge or pain in the face, especially when you bend forward. These may be signs of sinusitis.  You develop a fever, swollen neck glands, pain with swallowing, or white areas in the back of your throat. These may be signs of strep throat. SEEK IMMEDIATE MEDICAL CARE IF:   You have a fever.  You develop severe or persistent headache, ear pain, sinus pain, or chest pain.  You develop wheezing, a prolonged cough, cough up blood, or have a change in your usual mucus (if you have chronic lung disease).  You develop sore muscles or a stiff neck. Document Released: 07/20/2000 Document Revised: 04/18/2011 Document Reviewed: 05/01/2013 ExitCare Patient Information 2015 [employer]. This information is not intended to replace advice given to you by your health care provider. Make sure you discuss any questions you have with your health care provider.  

## 2014-06-21 NOTE — ED Provider Notes (Signed)
CSN: [id number]     Arrival date & time 06/21/14  7124 History   First MD Initiated Contact with Patient 06/21/14 0701     Chief Complaint  Patient presents with  . Cough  . Flank Pain     (Consider location/radiation/quality/duration/timing/severity/associated sxs/prior Treatment) Patient is a 74 y.o. female presenting with cough and flank pain.  Cough Cough characteristics:  Productive Sputum characteristics:  Yellow Severity:  Moderate Onset quality:  Gradual Duration:  3 days Timing:  Constant Progression:  Unchanged Chronicity:  New Context: sick contacts and upper respiratory infection   Relieved by:  Nothing Associated symptoms: fever (subjective), sinus congestion and sore throat   Associated symptoms: no chest pain and no shortness of breath   Flank Pain This is a new problem. Episode onset: 1 week. The problem occurs constantly. The problem has not changed since onset.Pertinent negatives include no chest pain and no shortness of breath. Exacerbated by: walking. The symptoms are relieved by rest. She has tried nothing for the symptoms.    Past Medical History  Diagnosis Date  . Breast cancer   . Diabetes mellitus without complication   . Hypertension   . GERD (gastroesophageal reflux disease)   . Hyperlipemia   . Wears glasses   . Cancer    Past Surgical History  Procedure Laterality Date  . Abdominal hysterectomy    . Colonoscopy    . Upper gi endoscopy    . Back surgery  2002    lumbar lam  . Cardiac catheterization  2003  . Simple mastectomy with axillary sentinel node biopsy Right 12/03/2012    Procedure: RIGHT SIMPLE MASTECTOMY WITH RIGHT  AXILLARY SENTINEL LYMPH NODE BIOPSY;  Surgeon: Shann Medal, MD;  Location: [facility];  Service: General;  Laterality: Right;   Family History  Problem Relation Age of Onset  . Hypertension Father   . Hypertension Mother   . Hypertension Paternal Grandmother    History  Substance Use Topics   . Smoking status: Former Smoker -- 0.30 packs/day for 30 years    Types: Cigarettes    Quit date: 11/26/1988  . Smokeless tobacco: Never Used  . Alcohol Use: 2.4 oz/week    4 Glasses of wine per week     Comment: occ   OB History    No data available     Review of Systems  Constitutional: Positive for fever (subjective).  HENT: Positive for sore throat.   Respiratory: Positive for cough. Negative for shortness of breath.   Cardiovascular: Negative for chest pain.  Genitourinary: Positive for flank pain.  All other systems reviewed and are negative.     Allergies  Review of patient's allergies indicates no known allergies.  Home Medications   Prior to Admission medications   Medication Sig Start Date End Date Taking? Authorizing Provider  amLODipine (NORVASC) 10 MG tablet Take 10 mg by mouth daily.    Historical Provider, MD  anastrozole (ARIMIDEX) 1 MG tablet TAKE ONE TABLET BY MOUTH ONCE DAILY 06/18/14   Chauncey Cruel, MD  cloNIDine (CATAPRES) 0.3 MG tablet Take 0.3 mg by mouth 2 (two) times daily.    Willey Blade, MD  docusate sodium (COLACE) 100 MG capsule Take 200 mg by mouth 2 (two) times daily as needed for mild constipation.    Historical Provider, MD  fluconazole (DIFLUCAN) 100 MG tablet Take 1 tablet (100 mg total) by mouth daily. Patient not taking: Reported on 03/05/2014 10/29/13   Chauncey Cruel,  MD  gabapentin (NEURONTIN) 300 MG capsule Take 1 capsule (300 mg total) by mouth 3 (three) times daily. 03/05/14   Laurie Panda, NP  metFORMIN (GLUCOPHAGE) 500 MG tablet Take 500 mg by mouth daily with breakfast.    Willey Blade, MD  olmesartan-hydrochlorothiazide (BENICAR HCT) 40-25 MG per tablet Take 1 tablet by mouth daily.    Willey Blade, MD  omeprazole (PRILOSEC) 40 MG capsule Take 40 mg by mouth 2 (two) times daily.    Ronald Lobo, MD  rosuvastatin (CRESTOR) 10 MG tablet Take 10 mg by mouth daily.    Willey Blade, MD  traMADol (ULTRAM)  50 MG tablet TAKE ONE TO TWO TABLETS BY MOUTH EVERY 6 HOURS AS NEEDED FOR PAIN 05/29/14   Chauncey Cruel, MD   BP 148/72 mmHg  Pulse 55  Temp(Src) 98 F (36.7 C) (Oral)  Resp 16  Ht 5\' 9"  (1.753 m)  Wt 222 lb (100.699 kg)  BMI 32.77 kg/m2  SpO2 97% Physical Exam  Constitutional: She is oriented to person, place, and time. She appears well-developed and well-nourished. No distress.  HENT:  Head: Normocephalic and atraumatic.  Mouth/Throat: Oropharynx is clear and moist.  Eyes: Conjunctivae are normal. Pupils are equal, round, and reactive to light. No scleral icterus.  Neck: Neck supple.  Cardiovascular: Normal rate, regular rhythm, normal heart sounds and intact distal pulses.   No murmur heard. Pulmonary/Chest: Effort normal and breath sounds normal. No stridor. No respiratory distress. She has no wheezes. She has no rales.  Abdominal: Soft. Bowel sounds are normal. She exhibits no distension. There is no tenderness. There is no rigidity, no rebound, no guarding and no CVA tenderness.  Musculoskeletal: Normal range of motion.  Neurological: She is alert and oriented to person, place, and time.  Skin: Skin is warm and dry. No rash noted.  Psychiatric: She has a normal mood and affect. Her behavior is normal.  Nursing note and vitals reviewed.   ED Course  Procedures (including critical care time) Labs Review Labs Reviewed  CBC - Abnormal; Notable for the following:    RBC 3.84 (*)    Hemoglobin 11.3 (*)    HCT 34.2 (*)    All other components within normal limits  BASIC METABOLIC PANEL - Abnormal; Notable for the following:    Potassium 3.1 (*)    Glucose, Bld 120 (*)    BUN 23 (*)    Creatinine, Ser 1.43 (*)    GFR calc non Af Amer 36 (*)    GFR calc Af Amer 41 (*)    All other components within normal limits  URINALYSIS, ROUTINE W REFLEX MICROSCOPIC - Abnormal; Notable for the following:    Leukocytes, UA SMALL (*)    All other components within normal limits   URINE MICROSCOPIC-ADD ON - Abnormal; Notable for the following:    Squamous Epithelial / LPF FEW (*)    Bacteria, UA FEW (*)    All other components within normal limits    Imaging Review Dg Chest 2 View  06/21/2014   CLINICAL DATA:  Productive cough.  Chills.  Symptoms for 3 days.  EXAM: CHEST  2 VIEW  COMPARISON:  12/26/2012 CT  FINDINGS: Borderline cardiomegaly. Lungs hyper aerated and clear. No pneumothorax. No pleural effusion. No consolidation or lung mass.  IMPRESSION: No active cardiopulmonary disease.   Electronically Signed   By: Marybelle Killings M.D.   On: 06/21/2014 08:23     EKG Interpretation None  MDM   Final diagnoses:  Cough  Acute URI    74 yo female with URI symptoms for past 3 days.  Well appearing, nontoxic, stable vitals, good O2 sats, normal respiratory effort and lung sounds.  Plan CXR and screening labs.  If normal, pt likely has viral URI.    She also complains of right flank pain, but states that the reason she is in the ED is her cough.  No abdominal or flank pain on exam.  Will check UA, but it appears that she can follow up with her PCP regarding this complaint.    CXR negative.  Plan dc with pcp follow up.    Serita Grit, MD 06/21/14 1113

## 2014-06-21 NOTE — ED Notes (Signed)
Pt has had right sided mastectomy -- RESTRICTED RIGHT ARM.

## 2014-06-21 NOTE — ED Notes (Signed)
Pt monitored by pulse ox, bp cuff, and 5-lead. 

## 2014-06-21 NOTE — ED Notes (Addendum)
Pt presents with c/o productive cough x3 days and right flank pain on ambulation only x1wk. Pt denies sore throat/nasal congestion, denies urinary symptoms, denies NVD.

## 2014-06-21 NOTE — ED Notes (Signed)
Dr. Wofford at bedside 

## 2014-06-23 DIAGNOSIS — C50919 Malignant neoplasm of unspecified site of unspecified female breast: Secondary | ICD-10-CM | POA: Diagnosis not present

## 2014-07-01 DIAGNOSIS — Z853 Personal history of malignant neoplasm of breast: Secondary | ICD-10-CM | POA: Diagnosis not present

## 2014-07-01 DIAGNOSIS — I10 Essential (primary) hypertension: Secondary | ICD-10-CM | POA: Diagnosis not present

## 2014-07-01 DIAGNOSIS — E119 Type 2 diabetes mellitus without complications: Secondary | ICD-10-CM | POA: Diagnosis not present

## 2014-07-01 DIAGNOSIS — E780 Pure hypercholesterolemia: Secondary | ICD-10-CM | POA: Diagnosis not present

## 2014-07-01 DIAGNOSIS — Z1389 Encounter for screening for other disorder: Secondary | ICD-10-CM | POA: Diagnosis not present

## 2014-07-01 DIAGNOSIS — Z0000 Encounter for general adult medical examination without abnormal findings: Secondary | ICD-10-CM | POA: Diagnosis not present

## 2014-07-01 DIAGNOSIS — Z79899 Other long term (current) drug therapy: Secondary | ICD-10-CM | POA: Diagnosis not present

## 2014-07-10 ENCOUNTER — Other Ambulatory Visit (HOSPITAL_BASED_OUTPATIENT_CLINIC_OR_DEPARTMENT_OTHER): Payer: Medicare Other

## 2014-07-10 ENCOUNTER — Ambulatory Visit (HOSPITAL_BASED_OUTPATIENT_CLINIC_OR_DEPARTMENT_OTHER): Payer: Medicare Other | Admitting: Oncology

## 2014-07-10 ENCOUNTER — Telehealth: Payer: Self-pay | Admitting: Oncology

## 2014-07-10 ENCOUNTER — Encounter: Payer: Self-pay | Admitting: Oncology

## 2014-07-10 VITALS — BP 172/78 | HR 73 | Temp 98.3°F | Resp 18 | Ht 69.0 in | Wt 223.2 lb

## 2014-07-10 DIAGNOSIS — Z79818 Long term (current) use of other agents affecting estrogen receptors and estrogen levels: Secondary | ICD-10-CM

## 2014-07-10 DIAGNOSIS — Z853 Personal history of malignant neoplasm of breast: Secondary | ICD-10-CM

## 2014-07-10 DIAGNOSIS — N183 Chronic kidney disease, stage 3 unspecified: Secondary | ICD-10-CM | POA: Insufficient documentation

## 2014-07-10 DIAGNOSIS — C50411 Malignant neoplasm of upper-outer quadrant of right female breast: Secondary | ICD-10-CM

## 2014-07-10 HISTORY — DX: Chronic kidney disease, stage 3 unspecified: N18.30

## 2014-07-10 LAB — COMPREHENSIVE METABOLIC PANEL (CC13)
ALK PHOS: 69 U/L (ref 40–150)
ALT: 10 U/L (ref 0–55)
ANION GAP: 11 meq/L (ref 3–11)
AST: 19 U/L (ref 5–34)
Albumin: 3.9 g/dL (ref 3.5–5.0)
BILIRUBIN TOTAL: 0.59 mg/dL (ref 0.20–1.20)
BUN: 18.9 mg/dL (ref 7.0–26.0)
CALCIUM: 9.7 mg/dL (ref 8.4–10.4)
CO2: 27 meq/L (ref 22–29)
Chloride: 103 mEq/L (ref 98–109)
Creatinine: 1.3 mg/dL — ABNORMAL HIGH (ref 0.6–1.1)
EGFR: 49 mL/min/{1.73_m2} — ABNORMAL LOW (ref >90)
Glucose: 136 mg/dl (ref 70–140)
POTASSIUM: 3.8 meq/L (ref 3.5–5.1)
Sodium: 142 mEq/L (ref 136–145)
Total Protein: 7.8 g/dL (ref 6.4–8.3)

## 2014-07-10 LAB — CBC WITH DIFFERENTIAL/PLATELET
BASO%: 0.3 % (ref 0.0–2.0)
Basophils Absolute: 0 10*3/uL (ref 0.0–0.1)
EOS%: 1.9 % (ref 0.0–7.0)
Eosinophils Absolute: 0.2 10*3/uL (ref 0.0–0.5)
HCT: 36.8 % (ref 34.8–46.6)
HGB: 12.1 g/dL (ref 11.6–15.9)
LYMPH#: 1.7 10*3/uL (ref 0.9–3.3)
LYMPH%: 21.1 % (ref 14.0–49.7)
MCH: 30.1 pg (ref 25.1–34.0)
MCHC: 32.9 g/dL (ref 31.5–36.0)
MCV: 91.5 fL (ref 79.5–101.0)
MONO#: 0.5 10*3/uL (ref 0.1–0.9)
MONO%: 6 % (ref 0.0–14.0)
NEUT#: 5.7 10*3/uL (ref 1.5–6.5)
NEUT%: 70.7 % (ref 38.4–76.8)
Platelets: 230 10*3/uL (ref 145–400)
RBC: 4.02 10*6/uL (ref 3.70–5.45)
RDW: 14.8 % — ABNORMAL HIGH (ref 11.2–14.5)
WBC: 8 10*3/uL (ref 3.9–10.3)

## 2014-07-10 MED ORDER — ANASTROZOLE 1 MG PO TABS: 1.0000 mg | ORAL_TABLET | Freq: Every day | ORAL | Status: DC

## 2014-07-10 NOTE — Addendum Note (Signed)
Addended by: Prentiss Bells on: 07/10/2014 04:58 PM   Modules accepted: Medications

## 2014-07-10 NOTE — Progress Notes (Signed)
ID: Carrie Key OB: [birth year]  MR#: [id number]  CSN#:[id number]  PCP: Maximino Greenland, MD GYN:   SU: Alphonsa Overall OTHER MD: Thea Silversmith, Robert Buccini  CHIEF COMPLAINT: Right Breast Cancer CURRENT TREATMENT: Anastrozole  BREAST CANCER HISTORY: From the initial intake note:  Carrie Key noted a lump in the upper-outer quadrant of her right breast and brought it to the attention of her primary care physician. She was set up for bilateral diagnostic mammography 10/31/2012 at [facility]. This showed her breast to be category A., almost entirely fatty. A 1.6 cm irregular high density mass with indistinct margins was noted in the right upper outer quadrant. This was palpable. Ultrasound of the right breast showed the mass to measure 1.5 cm, and to be lobulated. There was no axillary abnormality by ultrasound. Biopsy of the mass in question 11/01/2012 showed ([id number]) invasive ductal carcinoma, grade 1, estrogen and progesterone receptors both 100% positive. Biopsies obtained lateral and medial to this mass on the same day showed an identical morphology.  On 11/07/2012 the patient underwent bilateral breast MRI, which showed a total area of involvement measuring 13.2 cm, extending from the central to the upper lateral right breast. There were no abnormal appearing lymph nodes in the left breast was unremarkable.  Her subsequent history is as detailed below.   INTERVAL HISTORY: Carrie Key returns today for follow up of her breast cancer. She continues on anastrozole, with good tolerance. She does have some hot flashes and occasional night sweats. Vaginal dryness is not a major issue. She obtains a drug at a very good price.  REVIEW OF SYSTEMS: [facility]. She enjoys that. Of course she also does her housework. Sometimes she feels a little dizzy and her vision gets blurred. She sits down and soon feels better. This is likely due to her blood pressure  medication. She bruises easily. Recently she had significant problems with cough and phlegm production. This took her to the emergency room. Chest x-ray was clear. This suggested some chicken soup on Tylenol and she got much better after 2 or 3 days. Those symptoms have resolved. She has chronic back pain, and some difficulty walking. Her diabetes she says is well-controlled. She continues to have discomfort chiefly in the right axilla. This is unchanged. A detailed review of systems today was otherwise stable.  PAST MEDICAL HISTORY: Past Medical History  Diagnosis Date  . Breast cancer   . Diabetes mellitus without complication   . Hypertension   . GERD (gastroesophageal reflux disease)   . Hyperlipemia   . Wears glasses   . Cancer     PAST SURGICAL HISTORY: Past Surgical History  Procedure Laterality Date  . Abdominal hysterectomy    . Colonoscopy    . Upper gi endoscopy    . Back surgery  2002    lumbar lam  . Cardiac catheterization  2003  . Simple mastectomy with axillary sentinel node biopsy Right 12/03/2012    Procedure: RIGHT SIMPLE MASTECTOMY WITH RIGHT  AXILLARY SENTINEL LYMPH NODE BIOPSY;  Surgeon: Shann Medal, MD;  Location: [facility];  Service: General;  Laterality: Right;    FAMILY HISTORY  (updated 01/07/2013) Family History  Problem Relation Age of Onset  . Hypertension Father   . Hypertension Mother   . Hypertension Paternal Grandmother    The patient's father died at the age of 1 from a myocardial infarction. The patient's mother died at the age of 68 from  a stroke. The patient has 5 brothers, 4 sisters. There is no history of breast or ovarian cancer in the family  GYNECOLOGIC HISTORY:   (Updated 01/07/2013) Menarche age 42, first live birth age 87. The patient is GX P6. She stopped having periods in 1990. She is status post total abdominal hysterectomy with bilateral salpingo-oophorectomy. She never took hormone replacement.  SOCIAL  HISTORY:   (Updated 01/07/2013) Carrie Key worked in [employer] at [employer] but retired 2000. She lives by herself, with no pets, although her friend and significant other, Carrie Key, visits daily. She has one son who died from a stroke at age 101. Son Carrie Key works for [employer] in [place]. Daughter Carrie Key works in a [employer] in [place]. Son Carrie Key lives in [place], and is currently unemployed. Son Carrie Key lives in [place] and [place]. The patient has 11 grandchildren. She attends [employer]    ADVANCED DIRECTIVES: Not in place   HEALTH MAINTENANCE:  (Updated 01/07/2013) History  Substance Use Topics  . Smoking status: Former Smoker -- 0.30 packs/day for 30 years    Types: Cigarettes    Quit date: 11/26/1988  . Smokeless tobacco: Never Used  . Alcohol Use: 2.4 oz/week    4 Glasses of wine per week     Comment: occ     Colonoscopy: 2013  PAP: Status post hysterectomy  Bone density: Not on file  Lipid panel:  Dr. Karlton Lemon  No Known Allergies  Current Outpatient Prescriptions  Medication Sig Dispense Refill  . amLODipine (NORVASC) 10 MG tablet Take 10 mg by mouth daily.    Marland Kitchen anastrozole (ARIMIDEX) 1 MG tablet TAKE ONE TABLET BY MOUTH ONCE DAILY 30 tablet 1  . Azilsartan-Chlorthalidone 40-12.5 MG TABS Take 1 tablet by mouth daily.    . cloNIDine (CATAPRES) 0.3 MG tablet Take 0.3 mg by mouth 2 (two) times daily.    Marland Kitchen docusate sodium (COLACE) 100 MG capsule Take 200 mg by mouth 2 (two) times daily as needed for mild constipation.    . fluconazole (DIFLUCAN) 100 MG tablet Take 1 tablet (100 mg total) by mouth daily. 30 tablet 3  . gabapentin (NEURONTIN) 300 MG capsule Take 1 capsule (300 mg total) by mouth 3 (three) times daily. (Patient taking differently: Take 300 mg by mouth 3 (three) times daily as needed (nerve pain). ) 30 capsule 3  . ibuprofen (ADVIL,MOTRIN) 200 MG tablet Take 400 mg by mouth every  6 (six) hours as needed for mild pain or moderate pain.    Marland Kitchen omeprazole (PRILOSEC) 40 MG capsule Take 40 mg by mouth daily.     . rosuvastatin (CRESTOR) 10 MG tablet Take 10 mg by mouth daily.    . traMADol (ULTRAM) 50 MG tablet TAKE ONE TO TWO TABLETS BY MOUTH EVERY 6 HOURS AS NEEDED FOR PAIN 90 tablet 3  . Vitamin D, Ergocalciferol, (DRISDOL) 50000 UNITS CAPS capsule Take 50,000 Units by mouth 2 (two) times a week.     No current facility-administered medications for this visit.    OBJECTIVE: Older African American woman in no acute distress Filed Vitals:   07/10/14 1350  BP: 172/78  Pulse: 73  Temp: 98.3 F (36.8 C)  Resp: 18     Body mass index is 32.95 kg/(m^2).    ECOG FS: 1 Filed Weights   07/10/14 1350  Weight: 223 lb 3.2 oz (101.243 kg)   Sclerae unicteric, EOMs intact Oropharynx clear, slightly dry No  cervical or supraclavicular adenopathy Lungs no rales or rhonchi, no wheezes or rubs Heart regular rate and rhythm Abd soft, obese, nontender, positive bowel sounds MSK no focal spinal tenderness, no upper extremity lymphedema Neuro: nonfocal, well oriented, appropriate affect Breasts: The right breast is status post mastectomy. There is no evidence of chest wall recurrence. The right axilla is benign per the left breast is unremarkable.   LAB RESULTS:  Lab Results  Component Value Date   WBC 8.0 07/10/2014   NEUTROABS 5.7 07/10/2014   HGB 12.1 07/10/2014   HCT 36.8 07/10/2014   MCV 91.5 07/10/2014   PLT 230 07/10/2014      Chemistry      Component Value Date/Time   NA 142 07/10/2014 1327   NA 139 06/21/2014 0743   K 3.8 07/10/2014 1327   K 3.1* 06/21/2014 0743   CL 104 06/21/2014 0743   CO2 27 07/10/2014 1327   CO2 26 06/21/2014 0743   BUN 18.9 07/10/2014 1327   BUN 23* 06/21/2014 0743   CREATININE 1.3* 07/10/2014 1327   CREATININE 1.43* 06/21/2014 0743      Component Value Date/Time   CALCIUM 9.7 07/10/2014 1327   CALCIUM 9.5 06/21/2014 0743    ALKPHOS 69 07/10/2014 1327   AST 19 07/10/2014 1327   ALT 10 07/10/2014 1327   BILITOT 0.59 07/10/2014 1327       STUDIES: Dg Chest 2 View  06/21/2014   CLINICAL DATA:  Productive cough.  Chills.  Symptoms for 3 days.  EXAM: CHEST  2 VIEW  COMPARISON:  12/26/2012 CT  FINDINGS: Borderline cardiomegaly. Lungs hyper aerated and clear. No pneumothorax. No pleural effusion. No consolidation or lung mass.  IMPRESSION: No active cardiopulmonary disease.   Electronically Signed   By: Marybelle Killings M.D.   On: 06/21/2014 08:23   ASSESSMENT: 74 y.o. [place] woman status post right breast biopsy at 3 separate sites in the right breast, all 3 sites showing ductal carcinoma in situ, low-grade, estrogen and progesterone receptors both 100% positive  (1) Status post right mastectomy and sentinel lymph node sampling 12/03/2012 for a pT3 pN1, stage IIIA invasive ductal carcinoma, estrogen receptor 99% positive, progesterone receptor 96% positive, with an MIB-1 of 9% and no HER-2 amplification.  (2) Oncotype DX score of 6 predicts a distant recurrence risk of 8% within the next 10 years if the patient's only systemic treatment is tamoxifen for 5 years. It also predicts no benefit from chemotherapy  (3)  began anastrozole, 01/07/2013,  to continue for 5 years.  PLAN: Caily is tolerating the anastrozole well. The plan will be for her to continue on that drug to a total of 5 years. She is behind on mammography and bone density and she tells me at least a mammogram has been scheduled for tomorrow. Hopefully the bone density can be done at the same time and I have reentered that order to facilitate that.  I think she would be a good patient to transition to survivorship. I am making her next appointment with our new survivorship nurse practitioner. She can then continue to follow the patient until late in 2019, at which time I would like to see Carrie Key 1 last time before releasing her to her primary care  physician  I have encouraged Kataleyah to continue to exercise regularly. She will call with any problems that may develop before her next visit here.  Chauncey Cruel, MD   07/10/2014 2:19 PM

## 2014-07-10 NOTE — Telephone Encounter (Signed)
Gave patient avs report and appointments for January 2017. Per 07/10/14 pof lab/HF same day week of 02/25/15. Patient aware I will call with bone density appointment.

## 2014-07-11 DIAGNOSIS — Z1231 Encounter for screening mammogram for malignant neoplasm of breast: Secondary | ICD-10-CM | POA: Diagnosis not present

## 2014-07-22 ENCOUNTER — Other Ambulatory Visit: Payer: Self-pay | Admitting: Oncology

## 2014-07-22 ENCOUNTER — Telehealth: Payer: Self-pay | Admitting: Oncology

## 2014-07-22 DIAGNOSIS — Z79811 Long term (current) use of aromatase inhibitors: Secondary | ICD-10-CM

## 2014-07-22 DIAGNOSIS — M8580 Other specified disorders of bone density and structure, unspecified site: Secondary | ICD-10-CM

## 2014-07-22 NOTE — Telephone Encounter (Signed)
When patient checked out on 07/10/14 per the initial pof sent at 2:31 pm patient was to have follow up in January 2017 with HF and lab one week prior. Also an order was entered for bone density test. Appointments for January 2017 were scheduled and given to patient and patient was informed she would be contacted with bone density test. At 4:57 pm on 07/10/14 another pof came to the scheduling inbox requesting follow with the BSNP in 6 months with labs 1 week prior and no sign of the message from the 1st pof sent at 2:31 pm was fpound. Although a search for pof's sent on 07/10/14 shows that a pof was sent on 07/10/14 at 2:31 pm the information contained in this message cannot be recovered. Appointments for lab/HF January 2017 were cancelled and message sent to [facility] re patient being see in 6 months with BSNP with labs one week prior. Called patient today re bone density test and cancellation of January 2017 appointments but was not able to reach her or leave message on home phone. Called cell and left message re cancellation for January 2017 appointments and gave bone density test appointment for 07/29/14. Information mailed with note that January 2017 appointments have been cancelled and patient will be contacted by Elzie Rings re appointments for December 2016 with BSNP.

## 2014-07-25 DIAGNOSIS — C50919 Malignant neoplasm of unspecified site of unspecified female breast: Secondary | ICD-10-CM | POA: Diagnosis not present

## 2014-07-29 ENCOUNTER — Other Ambulatory Visit: Payer: Medicare Other

## 2014-10-01 DIAGNOSIS — K2270 Barrett's esophagus without dysplasia: Secondary | ICD-10-CM | POA: Diagnosis not present

## 2014-10-01 DIAGNOSIS — E780 Pure hypercholesterolemia: Secondary | ICD-10-CM | POA: Diagnosis not present

## 2014-10-01 DIAGNOSIS — R945 Abnormal results of liver function studies: Secondary | ICD-10-CM | POA: Diagnosis not present

## 2014-10-01 DIAGNOSIS — E119 Type 2 diabetes mellitus without complications: Secondary | ICD-10-CM | POA: Diagnosis not present

## 2014-10-01 DIAGNOSIS — Z79899 Other long term (current) drug therapy: Secondary | ICD-10-CM | POA: Diagnosis not present

## 2014-10-14 DIAGNOSIS — K2270 Barrett's esophagus without dysplasia: Secondary | ICD-10-CM | POA: Diagnosis not present

## 2014-10-14 DIAGNOSIS — E559 Vitamin D deficiency, unspecified: Secondary | ICD-10-CM | POA: Diagnosis not present

## 2014-10-14 DIAGNOSIS — Z79899 Other long term (current) drug therapy: Secondary | ICD-10-CM | POA: Diagnosis not present

## 2014-10-26 ENCOUNTER — Other Ambulatory Visit: Payer: Self-pay | Admitting: Oncology

## 2014-11-24 ENCOUNTER — Other Ambulatory Visit: Payer: Self-pay | Admitting: Oncology

## 2014-12-29 ENCOUNTER — Other Ambulatory Visit: Payer: Self-pay | Admitting: Oncology

## 2014-12-30 DIAGNOSIS — E119 Type 2 diabetes mellitus without complications: Secondary | ICD-10-CM | POA: Diagnosis not present

## 2014-12-30 DIAGNOSIS — I10 Essential (primary) hypertension: Secondary | ICD-10-CM | POA: Diagnosis not present

## 2014-12-30 DIAGNOSIS — E784 Other hyperlipidemia: Secondary | ICD-10-CM | POA: Diagnosis not present

## 2015-01-05 ENCOUNTER — Other Ambulatory Visit: Payer: Self-pay | Admitting: Gastroenterology

## 2015-01-05 DIAGNOSIS — K2270 Barrett's esophagus without dysplasia: Secondary | ICD-10-CM | POA: Diagnosis not present

## 2015-01-05 DIAGNOSIS — K317 Polyp of stomach and duodenum: Secondary | ICD-10-CM | POA: Diagnosis not present

## 2015-01-06 ENCOUNTER — Other Ambulatory Visit: Payer: Self-pay | Admitting: Oncology

## 2015-01-19 ENCOUNTER — Telehealth: Payer: Self-pay | Admitting: Nurse Practitioner

## 2015-01-19 NOTE — Telephone Encounter (Signed)
Not able to reach patient on home phone or lm. Left message for patient on cell phone re lab and LTS visit for 12/21 and 12/28. Mailed schedule with [place] flier.

## 2015-01-22 ENCOUNTER — Other Ambulatory Visit: Payer: Medicare Other

## 2015-01-26 ENCOUNTER — Other Ambulatory Visit: Payer: Self-pay | Admitting: Oncology

## 2015-01-26 ENCOUNTER — Other Ambulatory Visit: Payer: Self-pay | Admitting: *Deleted

## 2015-01-26 DIAGNOSIS — C50411 Malignant neoplasm of upper-outer quadrant of right female breast: Secondary | ICD-10-CM

## 2015-01-27 ENCOUNTER — Encounter: Payer: Medicare Other | Admitting: Nurse Practitioner

## 2015-01-28 ENCOUNTER — Other Ambulatory Visit (HOSPITAL_BASED_OUTPATIENT_CLINIC_OR_DEPARTMENT_OTHER): Payer: Medicare Other

## 2015-01-28 ENCOUNTER — Encounter: Payer: Self-pay | Admitting: Nurse Practitioner

## 2015-01-28 DIAGNOSIS — C50411 Malignant neoplasm of upper-outer quadrant of right female breast: Secondary | ICD-10-CM | POA: Diagnosis not present

## 2015-01-28 LAB — CBC WITH DIFFERENTIAL/PLATELET
BASO%: 0.5 % (ref 0.0–2.0)
Basophils Absolute: 0 10*3/uL (ref 0.0–0.1)
EOS ABS: 0.2 10*3/uL (ref 0.0–0.5)
EOS%: 2.7 % (ref 0.0–7.0)
HCT: 34 % — ABNORMAL LOW (ref 34.8–46.6)
HGB: 10.9 g/dL — ABNORMAL LOW (ref 11.6–15.9)
LYMPH%: 20.3 % (ref 14.0–49.7)
MCH: 29.6 pg (ref 25.1–34.0)
MCHC: 32.1 g/dL (ref 31.5–36.0)
MCV: 92.4 fL (ref 79.5–101.0)
MONO#: 0.4 10*3/uL (ref 0.1–0.9)
MONO%: 6 % (ref 0.0–14.0)
NEUT%: 70.5 % (ref 38.4–76.8)
NEUTROS ABS: 4.3 10*3/uL (ref 1.5–6.5)
PLATELETS: 194 10*3/uL (ref 145–400)
RBC: 3.68 10*6/uL — AB (ref 3.70–5.45)
RDW: 13.9 % (ref 11.2–14.5)
WBC: 6 10*3/uL (ref 3.9–10.3)
lymph#: 1.2 10*3/uL (ref 0.9–3.3)

## 2015-01-28 LAB — COMPREHENSIVE METABOLIC PANEL
ALBUMIN: 3.7 g/dL (ref 3.5–5.0)
ALK PHOS: 66 U/L (ref 40–150)
AST: 15 U/L (ref 5–34)
Anion Gap: 8 mEq/L (ref 3–11)
BILIRUBIN TOTAL: 0.49 mg/dL (ref 0.20–1.20)
BUN: 18.9 mg/dL (ref 7.0–26.0)
CALCIUM: 9.6 mg/dL (ref 8.4–10.4)
CO2: 25 mEq/L (ref 22–29)
CREATININE: 1.2 mg/dL — AB (ref 0.6–1.1)
Chloride: 106 mEq/L (ref 98–109)
EGFR: 53 mL/min/{1.73_m2} — ABNORMAL LOW (ref >90)
Glucose: 116 mg/dl (ref 70–140)
Potassium: 3.6 mEq/L (ref 3.5–5.1)
SODIUM: 140 meq/L (ref 136–145)
Total Protein: 7.3 g/dL (ref 6.4–8.3)

## 2015-01-30 ENCOUNTER — Other Ambulatory Visit: Payer: Self-pay | Admitting: Oncology

## 2015-02-04 ENCOUNTER — Encounter: Payer: Self-pay | Admitting: Nurse Practitioner

## 2015-02-05 ENCOUNTER — Other Ambulatory Visit: Payer: Self-pay | Admitting: Nurse Practitioner

## 2015-02-06 ENCOUNTER — Telehealth: Payer: Self-pay | Admitting: Nurse Practitioner

## 2015-02-06 NOTE — Telephone Encounter (Signed)
Per 12/29 pof r/s 12/28 LTS visit. Spoke with patient re new appointment for 1/6 @ 1 pm.

## 2015-02-13 ENCOUNTER — Encounter: Payer: Self-pay | Admitting: Nurse Practitioner

## 2015-02-13 ENCOUNTER — Ambulatory Visit (HOSPITAL_BASED_OUTPATIENT_CLINIC_OR_DEPARTMENT_OTHER): Payer: Medicare Other | Admitting: Nurse Practitioner

## 2015-02-13 VITALS — BP 161/70 | HR 69 | Temp 98.3°F | Resp 18

## 2015-02-13 DIAGNOSIS — C50411 Malignant neoplasm of upper-outer quadrant of right female breast: Secondary | ICD-10-CM

## 2015-02-13 DIAGNOSIS — Z780 Asymptomatic menopausal state: Secondary | ICD-10-CM | POA: Diagnosis not present

## 2015-02-13 DIAGNOSIS — Z853 Personal history of malignant neoplasm of breast: Secondary | ICD-10-CM

## 2015-02-13 NOTE — Patient Instructions (Addendum)
Thank you for coming in today!  We will get your bone density test and mammo ordered as we discussed and you will come back in 6 months to see me for your next checkup. Please call with any questions or new symptoms.  Happy New Year!  Symptoms to Watch for and Report to Your Provider  . Return of the cancer symptoms you had before- such as a lump or new growth where your cancer first started . New or unusual pain that seems unrelated to an injury and does not go away, including back pain or bone pain . Weight loss without trying/intending . Unexplained bleeding . A rash or allergic reaction, such as swelling, severe itching or wheezing . Chills or fevers . Persistent headaches . Shortness of breath or difficulty breathing . Bloody stools or blood in your urine . Lumps, bumps, swelling and/or nipple discharge . Nausea, vomiting, diarrhea, loss of appetite, or trouble swallowing . A cough that doesn't go away . Abdominal pain . Swelling in your arms or legs . Fractures . Hot flashes or other menopausal symptoms . Any other signs mentioned by your doctor or nurse or any unusual symptoms                 that you just can't explain   NOTE: Just because you have certain symptoms, it doesn't mean the cancer has come back or you have a new cancer. Symptoms can be due to other problems that need to be addressed.  It is important to watch for these symptoms and report them to your provider so you can be medically evaluated for any of these concerns!    Living a Life of Wellness After Cancer:  *Note: Please consult your health care provider before using any medications, supplements, over-the-counter products, or other interventions.  Also, please consult your primary care provider before you begin any lifestyle program (diet, exercise, etc.).  Your safety is our top priority and we want to make sure you continue to live a long and healthy life!    Healthy Lifestyle Recommendations  As a cancer  survivor, it is important develop a lifelong commitment to a healthy lifestyle. A healthy lifestyle can prevent cancer from returning as well as prevent other diseases like heart disease, diabetes and high blood pressure.  These are some things that you can do to have a healthy lifestyle:  Marland Kitchen Maintain a healthy weight.  . Exercise daily per your doctor's orders. . Eat a balanced diet high in fruits, vegetables, bran, and fiber. Limit intake of red meat      and processed foods.  . Limit how much alcohol you consume, if at all. Ali Lowe regular bone mineral density testing for osteoporosis.  . Talk to your doctor about cardiovascular disease or "heart disease" screening. . Stop smoking (if you smoke). . Know your family history. . Be mindful of your emotional, social, and spiritual needs. . Meet regularly with a Primary Care Provider (PCP). Find a PCP if you do not             already have one. . Talk to your doctor about regular cancer screening including screening for colon           cancer, GYN cancers, and skin cancer.
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
## 2015-02-17 ENCOUNTER — Telehealth: Payer: Self-pay | Admitting: Nurse Practitioner

## 2015-02-17 NOTE — Telephone Encounter (Signed)
Spoke with patient re f/u w/HM 7/10 - Bone Density @ [facility] 02/25/15 @ 2 pm - Mammo @ [facility] 07/13/15 @ 1 pm. Per [facility] patient has not had a bone density test with them. Appointments mailed.

## 2015-02-25 DIAGNOSIS — Z780 Asymptomatic menopausal state: Secondary | ICD-10-CM | POA: Diagnosis not present

## 2015-02-25 DIAGNOSIS — M8589 Other specified disorders of bone density and structure, multiple sites: Secondary | ICD-10-CM | POA: Diagnosis not present

## 2015-02-26 ENCOUNTER — Ambulatory Visit: Payer: Medicare Other | Admitting: Nurse Practitioner

## 2015-02-26 ENCOUNTER — Other Ambulatory Visit: Payer: Medicare Other

## 2015-03-23 ENCOUNTER — Other Ambulatory Visit: Payer: Self-pay | Admitting: Nurse Practitioner

## 2015-03-25 ENCOUNTER — Telehealth: Payer: Self-pay | Admitting: Oncology

## 2015-03-25 NOTE — Telephone Encounter (Signed)
Per 2/13 pof patient needs assistance rescheduling her mammo appointment. Patient has not missed her mammo appointment. Spoke with Amy at [facility] this morning and patient is scheduled for mammo 07/14/15 @ 1 pm. Spoke with patient today and per patient she did have her bone density test in January and thought her mammo was 2/6 and needed to r/s. Patient informed that mammo is scheduled at [facility] for 6/6 @ 1 pm.

## 2015-04-06 DIAGNOSIS — E119 Type 2 diabetes mellitus without complications: Secondary | ICD-10-CM | POA: Diagnosis not present

## 2015-04-06 DIAGNOSIS — E784 Other hyperlipidemia: Secondary | ICD-10-CM | POA: Diagnosis not present

## 2015-04-06 DIAGNOSIS — I10 Essential (primary) hypertension: Secondary | ICD-10-CM | POA: Diagnosis not present

## 2015-04-21 ENCOUNTER — Other Ambulatory Visit: Payer: Self-pay | Admitting: Oncology

## 2015-05-29 ENCOUNTER — Other Ambulatory Visit: Payer: Self-pay | Admitting: Oncology

## 2015-07-08 DIAGNOSIS — Z87891 Personal history of nicotine dependence: Secondary | ICD-10-CM | POA: Diagnosis not present

## 2015-07-08 DIAGNOSIS — E784 Other hyperlipidemia: Secondary | ICD-10-CM | POA: Diagnosis not present

## 2015-07-08 DIAGNOSIS — R7303 Prediabetes: Secondary | ICD-10-CM | POA: Diagnosis not present

## 2015-07-08 DIAGNOSIS — I10 Essential (primary) hypertension: Secondary | ICD-10-CM | POA: Diagnosis not present

## 2015-07-09 ENCOUNTER — Other Ambulatory Visit: Payer: Self-pay | Admitting: Oncology

## 2015-07-14 DIAGNOSIS — Z1231 Encounter for screening mammogram for malignant neoplasm of breast: Secondary | ICD-10-CM | POA: Diagnosis not present

## 2015-07-14 DIAGNOSIS — Z853 Personal history of malignant neoplasm of breast: Secondary | ICD-10-CM | POA: Diagnosis not present

## 2015-07-22 ENCOUNTER — Other Ambulatory Visit: Payer: Self-pay | Admitting: Oncology

## 2015-07-22 NOTE — Telephone Encounter (Signed)
Chart reviewed.  Rx signed by Dr. Jana Hakim and faxed to pharmacy.

## 2015-08-17 ENCOUNTER — Telehealth: Payer: Self-pay | Admitting: Nurse Practitioner

## 2015-08-17 ENCOUNTER — Ambulatory Visit (HOSPITAL_BASED_OUTPATIENT_CLINIC_OR_DEPARTMENT_OTHER): Payer: Medicare Other | Admitting: Nurse Practitioner

## 2015-08-17 ENCOUNTER — Encounter: Payer: Self-pay | Admitting: Nurse Practitioner

## 2015-08-17 VITALS — BP 146/80 | HR 77 | Temp 98.4°F | Resp 18 | Ht 69.0 in | Wt 224.8 lb

## 2015-08-17 DIAGNOSIS — N189 Chronic kidney disease, unspecified: Secondary | ICD-10-CM

## 2015-08-17 DIAGNOSIS — C50411 Malignant neoplasm of upper-outer quadrant of right female breast: Secondary | ICD-10-CM

## 2015-08-17 DIAGNOSIS — M25559 Pain in unspecified hip: Secondary | ICD-10-CM

## 2015-08-17 MED ORDER — TRAMADOL HCL 50 MG PO TABS: 50.0000 mg | ORAL_TABLET | Freq: Four times a day (QID) | ORAL | Status: DC | PRN

## 2015-08-17 MED ORDER — ANASTROZOLE 1 MG PO TABS: 1.0000 mg | ORAL_TABLET | Freq: Every day | ORAL | Status: DC
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
## 2015-08-17 NOTE — Patient Instructions (Addendum)
Thank you for coming in today!  As we discussed, please continue to take your anti-estrogen pill (arimidex - which I have refilled today) and also perform your self breast exam and report any changes either in your left breast or along your right mastectomy incision.  Please begin taking a calcium supplement (you can take one that has vitamin D in it) daily to help keep your bones strong, in addition to regular activity like walking.  If you note any new symptoms (please see below), be sure to notify us ASAP.  Your mammogram will be due in June 2018. We'll have you return in six month's time for your next appointment or sooner if you have any problems. Please be sure to stop by scheduling on your way out to make those appointment(s).  Looking forward to working with you in the future!  Let us know if you have any questions!  Symptoms to Watch for and Report to Your Provider  . Change along your mastectomy incision or lump / bump in opposite breast . New or unusual pain that seems unrelated to an injury and does not go away, including back pain or bone pain . Weight loss without trying/intending . Unexplained bleeding . A rash or allergic reaction, such as swelling, severe itching or wheezing . Chills or fevers . Persistent headaches . Shortness of breath or difficulty breathing . Bloody stools or blood in your urine . Nausea, vomiting, diarrhea, loss of appetite, or trouble swallowing . A cough that does not go away . Abdominal pain . Swelling in your arms or legs . Fractures . Hot flashes or other menopausal symptoms . Any other signs mentioned by your doctor or nurse or any unusual symptoms                 that you just can't explain   NOTE: Just because you have certain symptoms, it doesn't mean the cancer has come back or you have a new cancer. Symptoms can be due to other problems that need to be addressed.  It is important to watch for these symptoms and report them to your provider so you  can be medically evaluated for any of these concerns!    Living a Life of Wellness After Cancer:  *Note: Please consult your health care provider before using any medications, supplements, over-the-counter products, or other interventions.  Also, please consult your primary care provider before you begin any lifestyle program (diet, exercise, etc.).  Your safety is our top priority and we want to make sure you continue to live a long and healthy life!    Healthy Lifestyle Recommendations  As a cancer survivor, it is important develop a lifelong commitment to a healthy lifestyle. A healthy lifestyle can prevent cancer from returning as well as prevent other diseases like heart disease, diabetes and high blood pressure.  These are some things that you can do to have a healthy lifestyle:  Marland Kitchen Maintain a healthy weight.  . Exercise daily per your doctor's orders. . Eat a balanced diet high in fruits, vegetables, bran, and fiber. Limit intake of red meat      and processed foods.  . Limit how much alcohol you consume, if at all. Ali Lowe regular bone mineral density testing for osteoporosis.  . Talk to your doctor about cardiovascular disease or "heart disease" screening. . Stop smoking (if you smoke). . Know your family history. . Be mindful of your emotional, social, and spiritual needs. . Meet regularly with  a Primary Care Provider (PCP). Find a PCP if you do not             already have one. . Talk to your doctor about regular cancer screening including screening for colon           cancer, GYN cancers, and skin cancer.

## 2015-08-17 NOTE — Telephone Encounter (Signed)
Appt made and avs printed °

## 2015-08-19 DIAGNOSIS — R7309 Other abnormal glucose: Secondary | ICD-10-CM | POA: Diagnosis not present

## 2015-09-08 ENCOUNTER — Other Ambulatory Visit: Payer: Self-pay | Admitting: Oncology

## 2015-09-08 DIAGNOSIS — C50411 Malignant neoplasm of upper-outer quadrant of right female breast: Secondary | ICD-10-CM

## 2015-09-08 DIAGNOSIS — Z853 Personal history of malignant neoplasm of breast: Secondary | ICD-10-CM

## 2015-09-08 NOTE — Telephone Encounter (Signed)
Chart reviewed.

## 2015-12-02 DIAGNOSIS — E784 Other hyperlipidemia: Secondary | ICD-10-CM | POA: Diagnosis not present

## 2015-12-02 DIAGNOSIS — E119 Type 2 diabetes mellitus without complications: Secondary | ICD-10-CM | POA: Diagnosis not present

## 2015-12-02 DIAGNOSIS — R1030 Lower abdominal pain, unspecified: Secondary | ICD-10-CM | POA: Diagnosis not present

## 2015-12-02 DIAGNOSIS — I10 Essential (primary) hypertension: Secondary | ICD-10-CM | POA: Diagnosis not present

## 2016-01-18 ENCOUNTER — Other Ambulatory Visit: Payer: Self-pay | Admitting: Oncology

## 2016-01-18 DIAGNOSIS — C50411 Malignant neoplasm of upper-outer quadrant of right female breast: Secondary | ICD-10-CM

## 2016-01-18 DIAGNOSIS — Z853 Personal history of malignant neoplasm of breast: Secondary | ICD-10-CM

## 2016-02-17 NOTE — Progress Notes (Signed)
CLINIC:  Survivorship   REASON FOR VISIT:  Routine follow-up for history of breast cancer.   BRIEF ONCOLOGIC HISTORY:    Breast cancer of upper-outer quadrant of right female breast ([place])   10/31/2012 Mammogram    Right breast: 1.6 cm irregular high density mass with indistinct margins in UOQ      10/31/2012 Breast US    Right breast: 1.5 cm lobulated mass without axillary abnormality      11/01/2012 Initial Biopsy    Right breast core needle bx: DCIS involving papilloma grade 1, ER+ (100%), PR+ (100%); biopsies obtained lateral and medial to this mass on the same day showed an identical morphology (3 total)      11/07/2012 Breast MRI    Right breast: total area of involvement measuring 13.2 cm, extending from the central to the upper lateral right breast. There were no abnormal appearing lymph nodes. Left breast was unremarkable.      11/07/2012 Clinical Stage    Stage 0: Tis N0      12/03/2012 Definitive Surgery    Right mastectomy/SLNB: invasive ductal carcinoma, grade 2, spans 9 cm, ER+ (99%), PR+ (96%), HER2/neu negative, Ki67 9%. 1 LN reveals metastatic carcinoma (1/1) micromet      12/03/2012 Oncotype testing    RS 6 (8% ROR)      12/03/2012 Pathologic Stage    Stage IIIA: pT3 pN1      01/07/2013 -  Anti-estrogen oral therapy    Anastrozole 1 mg daily. Planned duration of therapy 5 years.        INTERVAL HISTORY:  Carrie Key presents to the [facility] today for routine follow-up for her history of breast cancer.  Overall, she reports feeling relatively well.    Her biggest complaint today is right low back pain that radiates around to the abdomen and down the back of her leg.  She has periodic leg weakness. The pain "comes & goes"; she describes it as sharp/shooting type pains.    Initially, we thought she reported pain with urination, but upon further questioning she denies any dysuria.   She has arthritis pains and stiff joints; she attributes  this to the anastrozole.  She was taking Tramadol for this, but she tells me she ran out of pills about 2 months ago.  She takes the gabapentin more as needed for her neuropathic pains.  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  She can't move her right shoulder very well either. Generally, she takes the gabapentin once daily, "but I don't always need it every day."  The gabapentin is minimally effective in controlling her symptoms.   She reports night sweats/hot flashes; this has been chronic she has been taking the anastrozole.   She sees her PCP regularly, generally every 3-4 months.  She had a colonoscopy in 2017; she tells me they removed some polyps at that visit. She has not had her flu shot this season.  She also tells me she has not had her pneumonia or shingles vaccines as well.    Of note, Carrie Massed, Carrie Key present during this patient's visit and examination.    REVIEW OF SYSTEMS:  Review of Systems  HENT:  Negative.   Eyes: Negative.   Respiratory: Positive for shortness of breath (DOE at times ).   Cardiovascular: Negative.   Endocrine: Positive for hot flashes.  Genitourinary: Negative for bladder incontinence, dysuria and vaginal bleeding.   Musculoskeletal: Positive for back pain  and myalgias.  Skin: Negative.   Neurological: Negative for dizziness and headaches.  Hematological: Negative.   Psychiatric/Behavioral: Negative.   Breast: Denies any new nodularity, masses, nipple changes, or nipple discharge.    A 14-point review of systems was completed and was negative, except as noted above.    PAST MEDICAL/SURGICAL HISTORY:  Past Medical History:  Diagnosis Date  . Breast cancer ([place])   . Cancer ([place])   . CKD (chronic kidney disease), stage III 07/10/2014  . Diabetes mellitus without complication ([place])   . GERD (gastroesophageal reflux disease)   . Hyperlipemia   . Hypertension   . Wears glasses    Past Surgical  History:  Procedure Laterality Date  . ABDOMINAL HYSTERECTOMY    . BACK SURGERY  2002   lumbar lam  . CARDIAC CATHETERIZATION  2003  . COLONOSCOPY    . SIMPLE MASTECTOMY WITH AXILLARY SENTINEL NODE BIOPSY Right 12/03/2012   Procedure: RIGHT SIMPLE MASTECTOMY WITH RIGHT  AXILLARY SENTINEL LYMPH NODE BIOPSY;  Surgeon: Shann Medal, MD;  Location: [facility];  Service: General;  Laterality: Right;  . UPPER GI ENDOSCOPY       ALLERGIES:  No Known Allergies   CURRENT MEDICATIONS:  Outpatient Encounter Prescriptions as of 02/18/2016  Medication Sig Note  . amLODipine (NORVASC) 10 MG tablet Take 10 mg by mouth daily.   Marland Kitchen anastrozole (ARIMIDEX) 1 MG tablet Take 1 tablet (1 mg total) by mouth daily.   . Azilsartan-Chlorthalidone 40-12.5 MG TABS Take 1 tablet by mouth daily.   . cloNIDine (CATAPRES) 0.3 MG tablet Take 0.3 mg by mouth 2 (two) times daily.   Marland Kitchen docusate sodium (COLACE) 100 MG capsule Take 200 mg by mouth 2 (two) times daily as needed for mild constipation. 06/21/2014: Only takes prn  . fluconazole (DIFLUCAN) 100 MG tablet Take 1 tablet (100 mg total) by mouth daily. (Patient not taking: Reported on 08/17/2015) 06/21/2014: Only takes when tongue is turning white  . gabapentin (NEURONTIN) 300 MG capsule Take 1 capsule (300 mg total) by mouth 3 (three) times daily. (Patient taking differently: Take 300 mg by mouth 3 (three) times daily as needed (nerve pain). ) 06/21/2014: Only takes prn  . gabapentin (NEURONTIN) 300 MG capsule TAKE ONE CAPSULE BY MOUTH THREE TIMES DAILY   . ibuprofen (ADVIL,MOTRIN) 200 MG tablet Take 400 mg by mouth every 6 (six) hours as needed for mild pain or moderate pain.   Marland Kitchen omeprazole (PRILOSEC) 40 MG capsule Take 40 mg by mouth daily.    . rosuvastatin (CRESTOR) 10 MG tablet Take 10 mg by mouth daily.   . traMADol (ULTRAM) 50 MG tablet Take 1 tablet (50 mg total) by mouth every 6 (six) hours as needed. for pain    No facility-administered  encounter medications on file as of 02/18/2016.      ONCOLOGIC FAMILY HISTORY:  Family History  Problem Relation Age of Onset  . Hypertension Father   . Hypertension Mother   . Hypertension Paternal Grandmother     GENETIC COUNSELING/TESTING: None.    SOCIAL HISTORY:  Carrie Key is widowed and lives alone in [place], [place]. She had 1 son, who is now deceased from a stroke.  Carrie Key is retired. She denies any current tobacco or illicit drug use. She drinks alcohol occasionally.    PHYSICAL EXAMINATION:  Vital Signs: Vitals:   02/18/16 1143  BP: (!) 175/79  Pulse: 68  Resp: 18  Temp: 97.7 F (36.5 C)  Filed Weights   02/18/16 1143  Weight: 226 lb 9.6 oz (102.8 kg)   General: Well-nourished, well-appearing female in no acute distress.  Unaccompanied today.   HEENT: Head is normocephalic.  Pupils equal and reactive to light. Conjunctivae clear without exudate.  Sclerae anicteric. Oral mucosa is pink, moist.  Oropharynx is pink without lesions or erythema.  Lymph: No cervical, supraclavicular, or infraclavicular lymphadenopathy noted on palpation.  Cardiovascular: Regular rate and rhythm. Respiratory: Clear to auscultation bilaterally. Chest expansion symmetric; breathing non-labored.  Breast Exam:  -Left breast: No appreciable masses on palpation. No skin redness, thickening, or peau d'orange appearance; no nipple retraction or nipple discharge. -Right breast: s/p mastectomy. No appreciable masses on palpation. No skin redness, thickening, or peau d'orange appearance; healed scar without erythema or nodularity. -Axilla: No axillary adenopathy bilaterally.  GI: Abdomen soft and round; non-tender, non-distended. Bowel sounds normoactive. No hepatosplenomegaly.   GU: Deferred.  Neuro/MSK: No focal deficits. Steady gait. Decreased right shoulder ROM. Right iliac crest mildly tender to palpation; more pain noted superior to iliac crest at paraspinal muscles on the right.  No  CVA tenderness bilaterally.  Psych: Mood and affect normal and appropriate for situation.  Extremities: No edema. Skin: Warm and dry.   LABORATORY DATA:  Urinalysis: 02/18/16    DIAGNOSTIC IMAGING:  Most recent mammogram: 07/14/15 [facility])       ASSESSMENT AND PLAN:  Ms.. Key is a pleasant 76 y.o. female with history of Stage IIIA right breast invasive ductal carcinoma, ER+/PR+/HER2-, diagnosed in 10/2012; treated with right mastectomy and anti-estrogen therapy with Anastrozole beginning in 01/2013.  She presents to the [facility]orship Clinic for surveillance and routine follow-up.    1. History of Stage IIIA right breast cancer:  Ms. Key is currently clinically and radiographically without evidence of disease or recurrence of breast cancer. She will be due for mammogram in 07/2016; orders placed today.  She will continue her anti-estrogen therapy with anastrozole, with plans to continue for 5 years.  She will return to the cancer center to see Survivorship Carrie Key, in 08/2016.  When she has completed 5 years of anti-estrogen therapy, she will need to return to the cancer center to see Dr. Jana Hakim for her "5 year visit" with him; this will likely need to take place in 08/2017.  I encouraged her to call me with any questions or concerns before her next visit at the cancer center, and I would be happy to see her sooner, if needed.    2. Arthralgias secondary to AI: Carrie Key has had chronic arthralgias, likely secondary to Anastrozole.  Tramadol has been effective in managing this pain in the past.  She did report that her PCP did give her a prescription for Tramadol a couple of months ago.  Below is the information available in the [place] Controlled Substance Reporting System for this patient.  I am happy to refill the Tramadol today (Tramadol 50 mg po Q6Hprn, #90); prescription given to patient. I asked that she only get her pain medication prescriptions from the cancer center going forward so there would  not be any confusion.  Also recommended she increase her bowel regimen to prevent constipation while taking pain medications.     3. Right lower back pain: Her right lower back pain appears to be more musculoskeletal/neuropathic in nature. I have low suspicion this pain is related to her h/o breast cancer as there is no real focal bone pain on exam.  Also, given that her pain radiates to  her abdomen and down her buttock, this pain could also be secondary to sciatica.  I strongly encouraged her to call us if the pain does not improve and we could certainly send her for imaging, if needed.  She agreed with this plan.    4. Hot flashes secondary to AI: Since Carrie Key already has a prescription for Gabapentin, I recommended she start taking 300 mg once daily at bedtime to help with her hot flashes/night sweats.  I gave her written instructions on how to take this medication and encouraged her to call me with questions.  I am hopeful this will provide some relief for her so that she is able to get better sleep.    5. Decreased right shoulder ROM: Carrie Key has residual decreased right shoulder ROM since her mastectomy surgery. She completed physical therapy in the past and would benefit from additional evaluation and treatment.  Referral placed to Outpatient Cancer Rehab/PT today.  They may be able to optimize her right lower back pain as well.    6. Possible UTI: We initially thought Carrie Key reported pain with urination and urinalysis was collected.  Upon further questioning, Carrie Key denies any dysuria or hematuria.  However, urinalysis did reveal possible UTI with moderate leukocyte esterase and 7-10 WBCs.  Given that she is asymptomatic, I will send the urine off for culture and will treat if the culture is positive with appropriate anti-microbial therapy.   7. Bone health:  Given Carrie Key's age, history of breast cancer, and her current anti-estrogen therapy with anastrozole, she is at risk for  bone demineralization. Her last DEXA scan was on 02/25/15 and showed osteopenia.  She will be due for biennial DEXA scan in 02/2017.  In the meantime, she was encouraged to increase her consumption of foods rich in calcium, as well as increase her weight-bearing activities.  She was given education on specific food and activities to promote bone health.  8. Cancer screening:  Due to Carrie Key's history and her age, she should receive screening for skin cancers & colon cancer. She was encouraged to follow-up with her PCP for appropriate cancer screenings.   9. Annual flu vaccine: Flu vaccine given today in our clinic.   10. Health maintenance and wellness promotion: Carrie Key was encouraged to consume 5-7 servings of fruits and vegetables per day. She was also encouraged to engage in moderate to vigorous exercise for 30 minutes per day most days of the week. She was instructed to limit her alcohol consumption and continue to abstain from tobacco use.    Dispo:  -Referral to PT for decreased shoulder ROM.  -Unilateral left breast mammogram due in 07/2016 at [facility]; orders placed today.  -Return to cancer center to see Survivorship Carrie Key in 08/2016.   A total of 35 minutes of face-to-face time was spent with this patient with greater than 50% of that time in counseling and care-coordination.   Mike Craze, [facility] [place] [phone]   Note: PRIMARY CARE PROVIDER Maximino Green[place], [place] [phone]

## 2016-02-17 NOTE — Pre-Procedure Instructions (Signed)
[facility] 

## 2016-02-18 ENCOUNTER — Ambulatory Visit (HOSPITAL_BASED_OUTPATIENT_CLINIC_OR_DEPARTMENT_OTHER): Payer: Medicare Other

## 2016-02-18 ENCOUNTER — Ambulatory Visit (HOSPITAL_BASED_OUTPATIENT_CLINIC_OR_DEPARTMENT_OTHER): Payer: Medicare Other | Admitting: Adult Health

## 2016-02-18 ENCOUNTER — Encounter: Payer: Self-pay | Admitting: Adult Health

## 2016-02-18 VITALS — BP 175/79 | HR 68 | Temp 97.7°F | Resp 18 | Ht 69.0 in | Wt 226.6 lb

## 2016-02-18 DIAGNOSIS — M25611 Stiffness of right shoulder, not elsewhere classified: Secondary | ICD-10-CM | POA: Diagnosis not present

## 2016-02-18 DIAGNOSIS — Z853 Personal history of malignant neoplasm of breast: Secondary | ICD-10-CM

## 2016-02-18 DIAGNOSIS — Z23 Encounter for immunization: Secondary | ICD-10-CM

## 2016-02-18 DIAGNOSIS — Z170 Estrogen receptor positive status [ER+]: Secondary | ICD-10-CM

## 2016-02-18 DIAGNOSIS — C50411 Malignant neoplasm of upper-outer quadrant of right female breast: Secondary | ICD-10-CM

## 2016-02-18 DIAGNOSIS — R300 Dysuria: Secondary | ICD-10-CM

## 2016-02-18 DIAGNOSIS — M25559 Pain in unspecified hip: Secondary | ICD-10-CM

## 2016-02-18 DIAGNOSIS — Z1231 Encounter for screening mammogram for malignant neoplasm of breast: Secondary | ICD-10-CM

## 2016-02-18 LAB — URINALYSIS, MICROSCOPIC - CHCC
BLOOD: NEGATIVE
Bilirubin (Urine): NEGATIVE
Glucose: NEGATIVE mg/dL
KETONES: NEGATIVE mg/dL
Nitrite: NEGATIVE
Protein: NEGATIVE mg/dL
RBC / HPF: NEGATIVE (ref 0–2)
Specific Gravity, Urine: 1.02 (ref 1.003–1.035)
Urobilinogen, UR: 0.2 mg/dL (ref 0.2–1)
pH: 6 (ref 4.6–8.0)

## 2016-02-18 MED ORDER — INFLUENZA VAC SPLIT QUAD 0.5 ML IM SUSY
0.5000 mL | PREFILLED_SYRINGE | Freq: Once | INTRAMUSCULAR | Status: AC
  Administered 2016-02-18: 0.5 mL via INTRAMUSCULAR
  Filled 2016-02-18: qty 0.5

## 2016-02-18 MED ORDER — TRAMADOL HCL 50 MG PO TABS: 50.0000 mg | ORAL_TABLET | Freq: Four times a day (QID) | ORAL | 0 refills | Status: DC | PRN

## 2016-02-18 NOTE — Patient Instructions (Addendum)
I enjoyed meeting you today!   -You received a prescription for Tramadol today.  Please only get prescriptions for the Tramadol from the cancer center and not your family doctor's office.  -Your urine did look like it may have a little bit of infection.  We will call you if your urine culture is positive and you need antibiotics.  -Try taking the Gabapentin capsule (300 mg) at bedtime to help with your hot flashes and night sweats.  -I placed referral to Physical Therapy for you; they will give you a call to get things set-up.  -You will get your mammogram at [facility] in June.  -You will come back to cancer center in July.   Call us with any questions!  Mike Craze, NP [place] [phone]

## 2016-02-19 LAB — URINE CULTURE

## 2016-02-24 ENCOUNTER — Ambulatory Visit: Payer: Medicare Other | Admitting: Physical Therapy

## 2016-03-02 ENCOUNTER — Ambulatory Visit: Payer: Medicare Other | Attending: Adult Health | Admitting: Physical Therapy

## 2016-03-02 DIAGNOSIS — M25611 Stiffness of right shoulder, not elsewhere classified: Secondary | ICD-10-CM | POA: Insufficient documentation

## 2016-03-02 DIAGNOSIS — M25511 Pain in right shoulder: Secondary | ICD-10-CM | POA: Insufficient documentation

## 2016-03-02 DIAGNOSIS — G8929 Other chronic pain: Secondary | ICD-10-CM | POA: Insufficient documentation

## 2016-03-02 DIAGNOSIS — M545 Low back pain, unspecified: Secondary | ICD-10-CM

## 2016-03-02 NOTE — Therapy (Signed)
[place], [place], [address] Phone: [phone]   Fax:  [phone]  Physical Therapy Evaluation  Patient Details  Name: Carrie Key MRN: [id number] Date of Birth: 04/17/1940 Referring Provider: Mike Craze, NP  Encounter Date: 03/02/2016      PT End of Session - 03/02/16 1645    Visit Number 1   Number of Visits 9   Date for PT Re-Evaluation 04/06/16   PT Start Time 1330   PT Stop Time 1427   PT Time Calculation (min) 57 min   Activity Tolerance Patient tolerated treatment well   Behavior During Therapy [facility] for tasks assessed/performed      Past Medical History:  Diagnosis Date  . Breast cancer ([place])   . Cancer ([place])   . CKD (chronic kidney disease), stage III 07/10/2014  . Diabetes mellitus without complication ([place])   . GERD (gastroesophageal reflux disease)   . Hyperlipemia   . Hypertension   . Wears glasses     Past Surgical History:  Procedure Laterality Date  . ABDOMINAL HYSTERECTOMY    . BACK SURGERY  2002   lumbar lam  . CARDIAC CATHETERIZATION  2003  . COLONOSCOPY    . SIMPLE MASTECTOMY WITH AXILLARY SENTINEL NODE BIOPSY Right 12/03/2012   Procedure: RIGHT SIMPLE MASTECTOMY WITH RIGHT  AXILLARY SENTINEL LYMPH NODE BIOPSY;  Surgeon: Shann Medal, MD;  Location: [facility];  Service: General;  Laterality: Right;  . UPPER GI ENDOSCOPY      There were no vitals filed for this visit.       Subjective Assessment - 03/02/16 1334    Subjective "I'm here because my arm bothers me a lot--underneath my arm and my breast where I had the surgery, and sometimes I hurt all the way down my arm. The sometime it don't hurt at all."  Also having some back pain at right low back, noticed when she bends forward.  She also reports they noticed a kidney infection three months ago, which she has been treating by drinking lots of water.   Pertinent History Carrie Key is a 76  y.o. female with history of Stage IIIA right breast invasive ductal carcinoma, ER+/PR+/HER2-, diagnosed in 10/2012; treated with right mastectomy and anti-estrogen therapy with Anastrozole beginning in 01/2013. Pt. has been seen here in the past for her right shoulder also. HTN controlled with meds.  h/o back surgery in 2000 for her low back; she is not sure what the surgery was, but it did not involve fusion, by my questioning.  That surgery was successful.   Patient Stated Goals "I want this soreness and aching to leave me, especially at night when I go to bed."   Currently in Pain? Yes   Pain Score 3    Pain Location Axilla  and chest   Pain Orientation Right   Pain Descriptors / Indicators Aching;Sore  ache at chest, soreness under arm   Pain Radiating Towards right fingers get numb at times   Pain Onset More than a month ago   Pain Frequency Intermittent   Aggravating Factors  using her arms   Pain Relieving Factors pain pill   Multiple Pain Sites Yes   Pain Score 4   Pain Location Back  right iliac crest from back to front   Pain Orientation Right;Lower   Pain Descriptors / Indicators Sharp   Pain Type Chronic pain   Pain Onset More than a month ago  Aggravating Factors  bending over   Pain Relieving Factors sitting down, pain pill            [facility] PT Assessment - 03/02/16 0001      Assessment   Medical Diagnosis h/o right breast cancer s/p mastectomy   Referring Provider Mike Craze, NP   Onset Date/Surgical Date 10/08/12  approx.   Hand Dominance Right   Prior Therapy none     Precautions   Precautions Other (comment)   Precaution Comments cancer precautions     Restrictions   Weight Bearing Restrictions No     Balance Screen   Has the patient fallen in the past 6 months No   Has the patient had a decrease in activity level because of a fear of falling?  No   Is the patient reluctant to leave their home because of a fear of falling?  No     Home  Environment   Living Environment Private residence   Living Arrangements Alone   Type of [place] Access Level entry     Prior Function   Level of [place] Retired   Leisure goes to exercise twice a week, for 45 minutes, in sitting and standing; she does a little bit of exercise at home--has dumbbell weights at hom  describes standing and seated hamstring stretches, rotation     Cognition   Overall Cognitive Status Within Functional Limits for tasks assessed     ROM / Strength   AROM / PROM / Strength AROM;PROM;Strength     AROM   Overall AROM Comments In standing, trunk AROM: flexion--reaches fingertips 8 inches to floor, with pain at end range (not return to stand); extension 90% loss and painful at end range (not when returned to upright); sidebend 25% loss bilat. and painful at end range; rotation 30% loss bilat, with right LBP on right rotation   AROM Assessment Site Shoulder   Right/Left Shoulder Right;Left   Right Shoulder Extension 40 Degrees   Right Shoulder Flexion 115 Degrees  limited by pain, pulling in right axilla   Right Shoulder ABduction 101 Degrees   Right Shoulder Internal Rotation 69 Degrees  supine   Right Shoulder External Rotation 50 Degrees   Left Shoulder Extension 58 Degrees   Left Shoulder Flexion 121 Degrees   Left Shoulder ABduction 135 Degrees   Left Shoulder Internal Rotation 75 Degrees  in supine   Left Shoulder External Rotation 70 Degrees     PROM   PROM Assessment Site Shoulder   Right/Left Shoulder Right   Right Shoulder Flexion 145 Degrees   Right Shoulder ABduction 107 Degrees     Strength   Overall Strength Comments right shoulder strength grossly 3-/5     Palpation   Palpation comment no cording felt nor seen; tissue around right shoulder/upper outer chest does not feel terribly bound down     Special Tests    Special Tests Rotator Cuff Impingement;Lumbar   Rotator Cuff Impingment tests  Michel Bickers test   Lumbar Tests other  single knee to chest painful Rt, not Lt; Rt ROM 20% limit     Hawkins-Kennedy test   Findings Negative   Side Right           LYMPHEDEMA/ONCOLOGY QUESTIONNAIRE - 03/02/16 1403      Lymphedema Assessments   Lymphedema Assessments Upper extremities     Right Upper Extremity Lymphedema   10 cm Proximal to Olecranon Process 37.7 cm  Olecranon Process 28.8 cm   10 cm Proximal to Ulnar Styloid Process 24 cm   Just Proximal to Ulnar Styloid Process 18.5 cm   Across Hand at [employer] 21.3 cm   At [place] of 2nd Digit 7.2 cm     Left Upper Extremity Lymphedema   10 cm Proximal to Olecranon Process 36.2 cm   Olecranon Process 29.4 cm   10 cm Proximal to Ulnar Styloid Process 22.8 cm   Just Proximal to Ulnar Styloid Process 18.1 cm   Across Hand at [employer] 20.9 cm   At [place] of 2nd Digit 7.1 cm                        PT Education - 03/02/16 1644    Education provided Yes   Education Details avoid stretches that aggravate back pain for now in her exercise class, such as bending forward enough to cause pain   Person(s) Educated Patient   Methods Explanation   Comprehension Verbalized understanding                [place] - 03/02/16 1655      CC Long Term Goal  #1   Title Independent with HEP for right shoulder ROM and strengthening.   Time 4   Period Weeks   Status New     CC Long Term Goal  #2   Title Independent with HEP for low back flexibility and core strengthening.   Time 4   Period Weeks   Status New     CC Long Term Goal  #3   Title Right shoulder pain decreased at least 60% with movement of right arm   Time 4   Period Weeks   Status New     CC Long Term Goal  #4   Title Low back pain decreased at least 50% with movement.   Time 4   Period Weeks   Status New     CC Long Term Goal  #5   Title right shoulder active flexion to at least 125 degrees for improved  overhead reach   Baseline 115 at eval compared to 121 on left   Time 4   Period Weeks   Status New     CC Long Term Goal  #6   Title right shoulder active abduction to at least 130 degrees   Baseline 115 at eval compared to 135 on left   Time 4   Period Weeks   Status New     CC Long Term Goal  #7   Title right shoulder strength grossly at least 4/5   Baseline 3-/5 at eval   Time 4   Period Weeks   Status New     Additional Goals   Additional Goals --            Plan - 03/02/16 1645    Clinical Impression Statement This is a quiet, pleasant older woman known to this clinic from past therapy.  She has a h/o right breast cancer with mastectomy in 10/2012.  She currently has limited Rt. shoulder ROM and strength, as well as discomfort with moving the arm.  Impingement sign is negative.  She also has pain at area of right iliac crest, which seems like it may be muscular in nature; it is aggravated with stretching and pain subsides when she stops stretches to the area.  Her eval today is moderate complexity due  to comorbidities of h/o back surger, cancer diagnosis, and HTN and evolving nature of her problems including newer onset of back pain.   Rehab Potential Good   Clinical Impairments Affecting Rehab Potential none   PT Frequency 2x / week   PT Duration 4 weeks   PT Treatment/Interventions ADLs/Self Care Home Management;Electrical Stimulation;Moist Heat;Therapeutic activities;Therapeutic exercise;Patient/family education;Cryotherapy;Manual techniques;Scar mobilization;Passive range of motion;Taping   PT Next Visit Plan Begin instructing patient in HEP for right shoulder AA/ROM, GENTLE low back stretching including single and double knee to chest  and lower trunk rotation without increased pain; treat right low back (iliac crest) area with ice at end of session to gauge benefit of that.  Continue to discourage exercise that aggravates back pain.  Later, progress to strengthening of  right shoulder and of core.   Consulted and Agree with Plan of Care Patient      Patient will benefit from skilled therapeutic intervention in order to improve the following deficits and impairments:  Pain, Decreased range of motion, Decreased strength, Impaired UE functional use, Impaired flexibility  Visit Diagnosis: Stiffness of right shoulder, not elsewhere classified - Plan: PT plan of care cert/re-cert  Right-sided low back pain without sciatica, unspecified chronicity - Plan: PT plan of care cert/re-cert  Chronic right shoulder pain - Plan: PT plan of care cert/re-cert      G-Codes - 61/51/83 1701    Functional Assessment Tool Used clinical judgement   Functional Limitation Carrying, moving and handling objects   Carrying, Moving and Handling Objects Current Status (U3735) At least 40 percent but less than 60 percent impaired, limited or restricted   Carrying, Moving and Handling Objects Goal Status (D8978) At least 1 percent but less than 20 percent impaired, limited or restricted       Problem List Patient Active Problem List   Diagnosis Date Noted  . CKD (chronic kidney disease), stage III 07/10/2014  . Insomnia w/ sleep apnea 09/12/2013  . Snoring 09/12/2013  . Severe obesity (BMI >= 40) ([place]) 09/12/2013  . Neuropathic pain 01/07/2013  . Breast cancer of upper-outer quadrant of right female breast ([place]) 11/05/2012  . HYPERLIPIDEMIA-MIXED 04/24/2009  . HYPERTENSION, BENIGN 04/24/2009    SALISBURY,DONNA 03/02/2016, 5:04 PM  [place], [place], [address] Phone: [phone]   Fax:  [phone]  Name: Martin Smeal MRN: [id number] Date of Birth: 04/09/1940  [name], PT 03/02/16 5:04 PM

## 2016-03-07 ENCOUNTER — Ambulatory Visit: Payer: Medicare Other | Admitting: Physical Therapy

## 2016-03-09 ENCOUNTER — Ambulatory Visit: Payer: Medicare Other | Admitting: Physical Therapy

## 2016-03-09 DIAGNOSIS — M25611 Stiffness of right shoulder, not elsewhere classified: Secondary | ICD-10-CM | POA: Diagnosis not present

## 2016-03-09 DIAGNOSIS — M25511 Pain in right shoulder: Secondary | ICD-10-CM | POA: Diagnosis not present

## 2016-03-09 DIAGNOSIS — M545 Low back pain, unspecified: Secondary | ICD-10-CM

## 2016-03-09 DIAGNOSIS — G8929 Other chronic pain: Secondary | ICD-10-CM | POA: Diagnosis not present

## 2016-03-09 NOTE — Therapy (Signed)
[place], [place], [address] Phone: [phone]   Fax:  [phone]  Physical Therapy Treatment  Patient Details  Name: Carrie Key MRN: [id number] Date of Birth: 11-27-1940 Referring Provider: Mike Craze, NP  Encounter Date: 03/09/2016      PT End of Session - 03/09/16 1549    Visit Number 2   Number of Visits 9   Date for PT Re-Evaluation 04/06/16   PT Start Time [id number]   PT Stop Time 1530   PT Time Calculation (min) 56 min   Activity Tolerance Patient tolerated treatment well   Behavior During Therapy [facility] for tasks assessed/performed      Past Medical History:  Diagnosis Date  . Breast cancer ([place])   . Cancer ([place])   . CKD (chronic kidney disease), stage III 07/10/2014  . Diabetes mellitus without complication ([place])   . GERD (gastroesophageal reflux disease)   . Hyperlipemia   . Hypertension   . Wears glasses     Past Surgical History:  Procedure Laterality Date  . ABDOMINAL HYSTERECTOMY    . BACK SURGERY  2002   lumbar lam  . CARDIAC CATHETERIZATION  2003  . COLONOSCOPY    . SIMPLE MASTECTOMY WITH AXILLARY SENTINEL NODE BIOPSY Right 12/03/2012   Procedure: RIGHT SIMPLE MASTECTOMY WITH RIGHT  AXILLARY SENTINEL LYMPH NODE BIOPSY;  Surgeon: Shann Medal, MD;  Location: [facility];  Service: General;  Laterality: Right;  . UPPER GI ENDOSCOPY      There were no vitals filed for this visit.      Subjective Assessment - 03/09/16 1434    Subjective "Just feel tired, sluggish."   Currently in Pain? Yes   Pain Score 5    Pain Location Arm   Pain Orientation Right   Pain Descriptors / Indicators Aching;Sore   Aggravating Factors  finds herself sleeping on it   Pain Relieving Factors pain pill                         [facility] Adult PT Treatment/Exercise - 03/09/16 0001      Lumbar Exercises: Stretches   Single Knee to Chest Stretch 5 reps  5  second holds each side   Lower Trunk Rotation 5 reps  each side, moving slowly   Lower Trunk Rotation Limitations approx. 50%     Shoulder Exercises: Supine   Horizontal ABduction AAROM;Both;5 reps  with dowel   Flexion AAROM;Both;5 reps  with dowel     Modalities   Modalities Cryotherapy     Cryotherapy   Number Minutes Cryotherapy 10 Minutes   Cryotherapy Location Lumbar Spine  to right of spine, iliac crest area and upper gluteal   Type of Cryotherapy Ice pack     Manual Therapy   Manual Therapy Soft tissue mobilization;Myofascial release;Scapular mobilization;Passive ROM   Myofascial Release Rt. UE myofascial pulling with gentle movement into abduction   Passive ROM to right shoulder in supine for ir, er, abduction and flexion, to tolerance                PT Education - 03/09/16 1548    Education provided Yes   Education Details supine dowel shoulder flexion and horizontal abduction; hooklying lower trunk rotation   Person(s) Educated Patient   Methods Explanation;Demonstration;Tactile cues;Verbal cues;Handout   Comprehension Verbalized understanding;Returned demonstration                Long Term  Clinic Goals - 03/02/16 1655      CC Long Term Goal  #1   Title Independent with HEP for right shoulder ROM and strengthening.   Time 4   Period Weeks   Status New     CC Long Term Goal  #2   Title Independent with HEP for low back flexibility and core strengthening.   Time 4   Period Weeks   Status New     CC Long Term Goal  #3   Title Right shoulder pain decreased at least 60% with movement of right arm   Time 4   Period Weeks   Status New     CC Long Term Goal  #4   Title Low back pain decreased at least 50% with movement.   Time 4   Period Weeks   Status New     CC Long Term Goal  #5   Title right shoulder active flexion to at least 125 degrees for improved overhead reach   Baseline 115 at eval compared to 121 on left   Time 4    Period Weeks   Status New     CC Long Term Goal  #6   Title right shoulder active abduction to at least 130 degrees   Baseline 115 at eval compared to 135 on left   Time 4   Period Weeks   Status New     CC Long Term Goal  #7   Title right shoulder strength grossly at least 4/5   Baseline 3-/5 at eval   Time 4   Period Weeks   Status New     Additional Goals   Additional Goals --            Plan - 03/09/16 1549    Clinical Impression Statement Patient did okay with tolerating new exercise and passive ROM of right shoulder today.  She had at least a slight decrease in right arm pain during the therapy session.  Cold pack at end of session to her low back felt good.   Rehab Potential Good   Clinical Impairments Affecting Rehab Potential none   PT Frequency 2x / week   PT Duration 4 weeks   PT Treatment/Interventions ADLs/Self Care Home Management;Electrical Stimulation;Moist Heat;Therapeutic activities;Therapeutic exercise;Patient/family education;Cryotherapy;Manual techniques;Scar mobilization;Passive range of motion;Taping   PT Next Visit Plan Assess tolerance to first session of exercise, P/ROM and cold pack; continue with these if tolerated well.   Consulted and Agree with Plan of Care Patient      Patient will benefit from skilled therapeutic intervention in order to improve the following deficits and impairments:  Pain, Decreased range of motion, Decreased strength, Impaired UE functional use, Impaired flexibility  Visit Diagnosis: Stiffness of right shoulder, not elsewhere classified  Right-sided low back pain without sciatica, unspecified chronicity  Chronic right shoulder pain     Problem List Patient Active Problem List   Diagnosis Date Noted  . CKD (chronic kidney disease), stage III 07/10/2014  . Insomnia w/ sleep apnea 09/12/2013  . Snoring 09/12/2013  . Severe obesity (BMI >= 40) ([place]) 09/12/2013  . Neuropathic pain 01/07/2013  . Breast cancer of  upper-outer quadrant of right female breast ([place]) 11/05/2012  . HYPERLIPIDEMIA-MIXED 04/24/2009  . HYPERTENSION, BENIGN 04/24/2009    SALISBURY,Carrie Key 03/09/2016, 3:52 PM  [place], [place], [address] Phone: [phone]   Fax:  [phone]  Name: Carrie Key MRN: [id number] Date of Birth: April 08, 1940  [place], PT 03/09/16 3:52 PM

## 2016-03-09 NOTE — Patient Instructions (Addendum)
DO THE FOLLOWING ONLY TO THE POINT OF FEELING A GENTLE STRETCH, NO PAIN   (1) Flexors Stick Stretch, Supine    Lie on back, stick in both hands above chest. Extend both arms over head as far as possible. Hold __5_ seconds. Repeat _5__ times per session. Do _2__ sessions per day.  Copyright  VHI. All rights reserved.    (2) Lie on your back, dowel in both hands like above.  Raise both hands up toward the ceiling, making elbows straight.  Then take arms out to the right as far as you can to feel a stretch.  Next, go over to the left with both arms to feel a stretch.  Keep stretches gentle.  Hold 5 seconds and do 5 repetitions going each way, twice a day.    (3) Supine With Rotation    Lie, back flat, legs bent, feet together. Rotate knees to one side. Hold _10__ seconds. Repeat to other side. Repeat _3-5__ times per session. Do _2__ sessions per day.  Copyright  VHI. All rights reserved.

## 2016-03-14 ENCOUNTER — Ambulatory Visit: Payer: Medicare Other | Attending: Adult Health | Admitting: Physical Therapy

## 2016-03-14 DIAGNOSIS — M545 Low back pain, unspecified: Secondary | ICD-10-CM

## 2016-03-14 DIAGNOSIS — M25611 Stiffness of right shoulder, not elsewhere classified: Secondary | ICD-10-CM | POA: Diagnosis not present

## 2016-03-14 DIAGNOSIS — M25511 Pain in right shoulder: Secondary | ICD-10-CM | POA: Diagnosis not present

## 2016-03-14 DIAGNOSIS — G8929 Other chronic pain: Secondary | ICD-10-CM | POA: Insufficient documentation

## 2016-03-14 NOTE — Therapy (Signed)
[place], [place], [address] Phone: [phone]   Fax:  [phone]  Physical Therapy Treatment  Patient Details  Name: Carrie Key MRN: [id number] Date of Birth: Jul 14, 1940 Referring Provider: Mike Craze, NP  Encounter Date: 03/14/2016      PT End of Session - 03/14/16 1656    Visit Number 3   Number of Visits 9   Date for PT Re-Evaluation 04/06/16   PT Start Time [id number]   PT Stop Time 1648   PT Time Calculation (min) 43 min   Activity Tolerance Patient tolerated treatment well   Behavior During Therapy [facility] for tasks assessed/performed      Past Medical History:  Diagnosis Date  . Breast cancer ([place])   . Cancer ([place])   . CKD (chronic kidney disease), stage III 07/10/2014  . Diabetes mellitus without complication ([place])   . GERD (gastroesophageal reflux disease)   . Hyperlipemia   . Hypertension   . Wears glasses     Past Surgical History:  Procedure Laterality Date  . ABDOMINAL HYSTERECTOMY    . BACK SURGERY  2002   lumbar lam  . CARDIAC CATHETERIZATION  2003  . COLONOSCOPY    . SIMPLE MASTECTOMY WITH AXILLARY SENTINEL NODE BIOPSY Right 12/03/2012   Procedure: RIGHT SIMPLE MASTECTOMY WITH RIGHT  AXILLARY SENTINEL LYMPH NODE BIOPSY;  Surgeon: Shann Medal, MD;  Location: [place];  Service: General;  Laterality: Right;  . UPPER GI ENDOSCOPY      There were no vitals filed for this visit.      Subjective Assessment - 03/14/16 1606    Subjective "I can tell you my back's not bothering me."  Hasn't felt the pain in a couple of days. Tried the home exercises and thinks she's been doing okay.   Currently in Pain? Yes   Pain Score 6    Pain Location Arm   Pain Orientation Right   Pain Descriptors / Indicators Sore   Aggravating Factors  lifting arm to do her hair   Pain Relieving Factors therapy last time                         [facility] Adult PT  Treatment/Exercise - 03/14/16 0001      Lumbar Exercises: Stretches   Single Knee to Chest Stretch 5 reps  5 second holds each side   Lower Trunk Rotation 5 reps  each side, moving slowly     Shoulder Exercises: Supine   Horizontal ABduction AAROM;Both;5 reps  with dowel   Flexion AAROM;Both;5 reps  with dowel     Shoulder Exercises: Seated   External Rotation AROM;Both  3 reps with scapular retraction   Other Seated Exercises backward shoulder rolls x 5     Shoulder Exercises: Sidelying   ABduction AROM;Right;5 reps   Other Sidelying Exercises D2 with right UE x 5      Cryotherapy   Number Minutes Cryotherapy 15 Minutes   Cryotherapy Location Lumbar Spine  to right of spine, iliac crest area and upper gluteal   Type of Cryotherapy Ice pack     Manual Therapy   Manual Therapy Scapular mobilization   Myofascial Release Rt. UE myofascial pulling with gentle movement into abduction   Scapular Mobilization In left sidelying to right scapula into protraction and depression at varying angles   Passive ROM to right shoulder in supine for ir, er, abduction and flexion, to tolerance  [facility] Goals - 03/02/16 1655      CC Long Term Goal  #1   Title Independent with HEP for right shoulder ROM and strengthening.   Time 4   Period Weeks   Status New     CC Long Term Goal  #2   Title Independent with HEP for low back flexibility and core strengthening.   Time 4   Period Weeks   Status New     CC Long Term Goal  #3   Title Right shoulder pain decreased at least 60% with movement of right arm   Time 4   Period Weeks   Status New     CC Long Term Goal  #4   Title Low back pain decreased at least 50% with movement.   Time 4   Period Weeks   Status New     CC Long Term Goal  #5   Title right shoulder active flexion to at least 125 degrees for improved overhead reach   Baseline 115 at eval compared to 121 on left   Time 4    Period Weeks   Status New     CC Long Term Goal  #6   Title right shoulder active abduction to at least 130 degrees   Baseline 115 at eval compared to 135 on left   Time 4   Period Weeks   Status New     CC Long Term Goal  #7   Title right shoulder strength grossly at least 4/5   Baseline 3-/5 at eval   Time 4   Period Weeks   Status New     Additional Goals   Additional Goals --            Plan - 03/14/16 1619    Clinical Impression Statement She has not had back pain in several days--perhaps since she was last here or at least for a couple of days (she is unclear about this).  Her right arm still hurts, though it felt better at end of session today again than at the beginning.    Rehab Potential Good   Clinical Impairments Affecting Rehab Potential none   PT Frequency 2x / week   PT Duration 4 weeks   PT Treatment/Interventions ADLs/Self Care Home Management;Electrical Stimulation;Moist Heat;Therapeutic activities;Therapeutic exercise;Patient/family education;Cryotherapy;Manual techniques;Scar mobilization;Passive range of motion;Taping   PT Next Visit Plan Remeasure and check goals; try supine scapular series with yellow Theraband.  Continue gentle stretches for right shoulder and low back; consider adding knee to chest to HEP, possible also AA right shoulder abduction.   PT Home Exercise Plan continue dowel exercises and lower trunk rotation in hooklying   Consulted and Agree with Plan of Care Patient      Patient will benefit from skilled therapeutic intervention in order to improve the following deficits and impairments:  Pain, Decreased range of motion, Decreased strength, Impaired UE functional use, Impaired flexibility  Visit Diagnosis: Stiffness of right shoulder, not elsewhere classified  Right-sided low back pain without sciatica, unspecified chronicity  Chronic right shoulder pain     Problem List Patient Active Problem List   Diagnosis Date Noted  .  CKD (chronic kidney disease), stage III 07/10/2014  . Insomnia w/ sleep apnea 09/12/2013  . Snoring 09/12/2013  . Severe obesity (BMI >= 40) ([place]) 09/12/2013  . Neuropathic pain 01/07/2013  . Breast cancer of upper-outer quadrant of right female breast ([place]) 11/05/2012  . HYPERLIPIDEMIA-MIXED 04/24/2009  . HYPERTENSION, BENIGN  04/24/2009    SALISBURY,DONNA 03/14/2016, 5:01 PM  [place], [place], [address] Phone: [phone]   Fax:  [phone]  Name: Ariyanah Simms MRN: [id number] Date of Birth: 25-Jun-1940  [name], PT 03/14/16 5:01 PM

## 2016-03-16 ENCOUNTER — Ambulatory Visit: Payer: Medicare Other | Admitting: Physical Therapy

## 2016-03-16 DIAGNOSIS — M25511 Pain in right shoulder: Secondary | ICD-10-CM

## 2016-03-16 DIAGNOSIS — G8929 Other chronic pain: Secondary | ICD-10-CM | POA: Diagnosis not present

## 2016-03-16 DIAGNOSIS — M25611 Stiffness of right shoulder, not elsewhere classified: Secondary | ICD-10-CM

## 2016-03-16 DIAGNOSIS — M545 Low back pain: Secondary | ICD-10-CM | POA: Diagnosis not present

## 2016-03-16 NOTE — Therapy (Signed)
[place], [place], [address] Phone: [phone]   Fax:  [phone]  Physical Therapy Treatment  Patient Details  Name: Carrie Key MRN: [id number] Date of Birth: 1940/08/20 Referring Provider: Mike Craze, NP  Encounter Date: 03/16/2016      PT End of Session - 03/16/16 1402    Visit Number 4   Number of Visits 9   Date for PT Re-Evaluation 04/06/16   PT Start Time 1301   PT Stop Time 0938   PT Time Calculation (min) 46 min   Activity Tolerance Patient tolerated treatment well   Behavior During Therapy [facility] for tasks assessed/performed      Past Medical History:  Diagnosis Date  . Breast cancer ([place])   . Cancer ([place])   . CKD (chronic kidney disease), stage III 07/10/2014  . Diabetes mellitus without complication ([place])   . GERD (gastroesophageal reflux disease)   . Hyperlipemia   . Hypertension   . Wears glasses     Past Surgical History:  Procedure Laterality Date  . ABDOMINAL HYSTERECTOMY    . BACK SURGERY  2002   lumbar lam  . CARDIAC CATHETERIZATION  2003  . COLONOSCOPY    . SIMPLE MASTECTOMY WITH AXILLARY SENTINEL NODE BIOPSY Right 12/03/2012   Procedure: RIGHT SIMPLE MASTECTOMY WITH RIGHT  AXILLARY SENTINEL LYMPH NODE BIOPSY;  Surgeon: Shann Medal, MD;  Location: [place];  Service: General;  Laterality: Right;  . UPPER GI ENDOSCOPY      There were no vitals filed for this visit.      Subjective Assessment - 03/16/16 1302    Subjective "I haven't had a bit of problem with my back.  My arm still is sore, that's all."   Currently in Pain? Yes   Pain Score 6    Pain Location Arm   Pain Orientation Right   Pain Descriptors / Indicators Sore   Aggravating Factors  nothing   Pain Relieving Factors therapy            [facility] PT Assessment - 03/16/16 0001      ROM / Strength   AROM / PROM / Strength AROM     AROM   AROM Assessment Site Shoulder    Right/Left Shoulder Right   Right Shoulder Flexion 120 Degrees   Right Shoulder ABduction 95 Degrees                     [facility] Adult PT Treatment/Exercise - 03/16/16 0001      Shoulder Exercises: Supine   Horizontal ABduction AROM;Both;5 reps   External Rotation AROM;Strengthening;Right;5 reps  against manual resistance   Internal Rotation AROM;Right;5 reps;Strengthening  manual resistance   Flexion Right;Strengthening  slow reversal holds at varying angles   Other Supine Exercises with right hand reaching toward ceiling, small circles to right, then to left 10 x each     Shoulder Exercises: Seated   Other Seated Exercises backward shoulder rolls x 5     Shoulder Exercises: Sidelying   ABduction AAROM;Right;5 reps   Other Sidelying Exercises right hand up toward ceiling, counter clockwise circles x 10, then clockwise circles x 5     Shoulder Exercises: Pulleys   Flexion 2 minutes   ABduction 2 minutes     Shoulder Exercises: Therapy Ball   Flexion 5 reps     Manual Therapy   Manual Therapy Myofascial release;Scapular mobilization;Passive ROM   Myofascial Release right UE myofascial  pulling with movement into abduction   Scapular Mobilization of right scapula in left sidelying with gentle rocking into protraction and depression   Passive ROM contract-relax for stretch into left shoulder er                        [facility] Goals - 03/16/16 1305      CC Long Term Goal  #1   Title Independent with HEP for right shoulder ROM and strengthening.   Status Partially Met     CC Long Term Goal  #2   Title Independent with HEP for low back flexibility and core strengthening.   Status Partially Met     CC Long Term Goal  #3   Title Right shoulder pain decreased at least 60% with movement of right arm   Status On-going     CC Long Term Goal  #4   Title Low back pain decreased at least 50% with movement.   Status Achieved     CC Long  Term Goal  #5   Title right shoulder active flexion to at least 125 degrees for improved overhead reach   Baseline 115 at eval compared to 121 on left; 120 on 03/16/16   Status Partially Met            Plan - 03/16/16 1402    Clinical Impression Statement Still no recurrence of back pain, but arm pain continues. She does feel that therapy is helping it, but was unable to quantify how much her pain has reduced.  Active right shoulder flexion is 5 degrees better, as is active abduction.  Nice progress.   Rehab Potential Good   Clinical Impairments Affecting Rehab Potential none   PT Frequency 2x / week   PT Duration 4 weeks   PT Treatment/Interventions ADLs/Self Care Home Management;Electrical Stimulation;Moist Heat;Therapeutic activities;Therapeutic exercise;Patient/family education;Cryotherapy;Manual techniques;Scar mobilization;Passive range of motion;Taping   PT Next Visit Plan Try supine scapular series with yellow Theraband.  Continue gentle stretches for right shoulder and low back; consider adding knee to chest to HEP, possible also AA right shoulder abduction.  Pt. may need to shedule more appointments.   PT Home Exercise Plan continue dowel exercises and lower trunk rotation in hooklying   Consulted and Agree with Plan of Care Patient      Patient will benefit from skilled therapeutic intervention in order to improve the following deficits and impairments:  Pain, Decreased range of motion, Decreased strength, Impaired UE functional use, Impaired flexibility  Visit Diagnosis: Stiffness of right shoulder, not elsewhere classified  Chronic right shoulder pain     Problem List Patient Active Problem List   Diagnosis Date Noted  . CKD (chronic kidney disease), stage III 07/10/2014  . Insomnia w/ sleep apnea 09/12/2013  . Snoring 09/12/2013  . Severe obesity (BMI >= 40) ([place]) 09/12/2013  . Neuropathic pain 01/07/2013  . Breast cancer of upper-outer quadrant of right female  breast ([place]) 11/05/2012  . HYPERLIPIDEMIA-MIXED 04/24/2009  . HYPERTENSION, BENIGN 04/24/2009    Carrie Key 03/16/2016, 2:05 PM  [place], [place], [address] Phone: [phone]   Fax:  [phone]  Name: Carrie Key MRN: [id number] Date of Birth: 02-16-1940  [name], PT 03/16/16 2:06 PM

## 2016-03-17 DIAGNOSIS — C50911 Malignant neoplasm of unspecified site of right female breast: Secondary | ICD-10-CM | POA: Diagnosis not present

## 2016-03-17 DIAGNOSIS — E119 Type 2 diabetes mellitus without complications: Secondary | ICD-10-CM | POA: Diagnosis not present

## 2016-03-17 DIAGNOSIS — Z23 Encounter for immunization: Secondary | ICD-10-CM | POA: Diagnosis not present

## 2016-03-17 DIAGNOSIS — I10 Essential (primary) hypertension: Secondary | ICD-10-CM | POA: Diagnosis not present

## 2016-03-17 DIAGNOSIS — Z0000 Encounter for general adult medical examination without abnormal findings: Secondary | ICD-10-CM | POA: Diagnosis not present

## 2016-03-21 ENCOUNTER — Ambulatory Visit: Payer: Medicare Other | Admitting: Physical Therapy

## 2016-03-21 DIAGNOSIS — M25611 Stiffness of right shoulder, not elsewhere classified: Secondary | ICD-10-CM | POA: Diagnosis not present

## 2016-03-21 DIAGNOSIS — G8929 Other chronic pain: Secondary | ICD-10-CM | POA: Diagnosis not present

## 2016-03-21 DIAGNOSIS — M25511 Pain in right shoulder: Secondary | ICD-10-CM | POA: Diagnosis not present

## 2016-03-21 DIAGNOSIS — M545 Low back pain: Secondary | ICD-10-CM | POA: Diagnosis not present

## 2016-03-21 NOTE — Therapy (Signed)
[place], [place], [address] Phone: [phone]   Fax:  [phone]  Physical Therapy Treatment  Patient Details  Name: Carrie Key MRN: [id number] Date of Birth: 1940-05-16 Referring Provider: Mike Craze, NP  Encounter Date: 03/21/2016      PT End of Session - 03/21/16 1939    Visit Number 5   Number of Visits 9   Date for PT Re-Evaluation 04/06/16   PT Start Time 0931   PT Stop Time 1017   PT Time Calculation (min) 46 min   Activity Tolerance Patient tolerated treatment well   Behavior During Therapy [facility] for tasks assessed/performed      Past Medical History:  Diagnosis Date  . Breast cancer ([place])   . Cancer ([place])   . CKD (chronic kidney disease), stage III 07/10/2014  . Diabetes mellitus without complication ([place])   . GERD (gastroesophageal reflux disease)   . Hyperlipemia   . Hypertension   . Wears glasses     Past Surgical History:  Procedure Laterality Date  . ABDOMINAL HYSTERECTOMY    . BACK SURGERY  2002   lumbar lam  . CARDIAC CATHETERIZATION  2003  . COLONOSCOPY    . SIMPLE MASTECTOMY WITH AXILLARY SENTINEL NODE BIOPSY Right 12/03/2012   Procedure: RIGHT SIMPLE MASTECTOMY WITH RIGHT  AXILLARY SENTINEL LYMPH NODE BIOPSY;  Surgeon: Shann Medal, MD;  Location: [place];  Service: General;  Laterality: Right;  . UPPER GI ENDOSCOPY      There were no vitals filed for this visit.      Subjective Assessment - 03/21/16 0934    Subjective "Morning's not my time.  Usually I'm just getting out of bed around this time."   Currently in Pain? Yes   Pain Score 7    Pain Location Arm   Pain Orientation Right   Pain Descriptors / Indicators Sore;Tightness   Aggravating Factors  nothing   Pain Relieving Factors therapy                         [facility] Adult PT Treatment/Exercise - 03/21/16 0001      Shoulder Exercises: Supine   Horizontal  ABduction AAROM;Right;5 reps  with dowel   Theraband Level (Shoulder Horizontal ABduction) Level 1 (Yellow)  x 5 with therapist guiding, for strengthening   External Rotation Strengthening;Both;5 reps;Theraband   Theraband Level (Shoulder External Rotation) Level 1 (Yellow)   Flexion Strengthening;Both;Theraband  narrow grip, 3 reps (discomfort with descent)   Theraband Level (Shoulder Flexion) Level 1 (Yellow)     Shoulder Exercises: Standing   ABduction AAROM;Right  tried dowel, wall walking, and wall slide, but all uncomfort     Manual Therapy   Scapular Mobilization of right scapula in left sidelying with gentle rocking into protraction and depression   Passive ROM gentle stretch into er, abduction, and flexion to patient's tolerance                PT Education - 03/21/16 1938    Education provided Yes   Education Details supine dowel right shoulder AA abduction   Person(s) Educated Patient   Methods Explanation;Demonstration;Tactile cues;Verbal cues;Handout   Comprehension Verbalized understanding;Returned demonstration                [facility] Goals - 03/16/16 1305      CC Long Term Goal  #1   Title Independent with HEP for right shoulder ROM  and strengthening.   Status Partially Met     CC Long Term Goal  #2   Title Independent with HEP for low back flexibility and core strengthening.   Status Partially Met     CC Long Term Goal  #3   Title Right shoulder pain decreased at least 60% with movement of right arm   Status On-going     CC Long Term Goal  #4   Title Low back pain decreased at least 50% with movement.   Status Achieved     CC Long Term Goal  #5   Title right shoulder active flexion to at least 125 degrees for improved overhead reach   Baseline 115 at eval compared to 121 on left; 120 on 03/16/16   Status Partially Met            Plan - 03/21/16 1939    Clinical Impression Statement Tried some new activities today.   Patient had difficulty with a number of different AA abduction exercises for right shoulder; she did best with supine position with cane for this.  We tried some supine scapular strengthening with yellow Theraband; she had difficulty with this, even with few repetitions, so this was not added to HEP yet. We discussed continuing PT, which we both agree has benefitted her, and she has more deficits to be worked on, so we agreed to continue.   Rehab Potential Good   Clinical Impairments Affecting Rehab Potential none   PT Frequency 2x / week   PT Duration 4 weeks   PT Treatment/Interventions ADLs/Self Care Home Management;Electrical Stimulation;Moist Heat;Therapeutic activities;Therapeutic exercise;Patient/family education;Cryotherapy;Manual techniques;Scar mobilization;Passive range of motion;Taping   PT Next Visit Plan Try supine scapular exercises again vs. yellow Theraband.  Continue right shoulder stretches.   PT Home Exercise Plan continue dowel exercises and lower trunk rotation in hooklying   Consulted and Agree with Plan of Care Patient      Patient will benefit from skilled therapeutic intervention in order to improve the following deficits and impairments:  Pain, Decreased range of motion, Decreased strength, Impaired UE functional use, Impaired flexibility  Visit Diagnosis: Stiffness of right shoulder, not elsewhere classified  Chronic right shoulder pain     Problem List Patient Active Problem List   Diagnosis Date Noted  . CKD (chronic kidney disease), stage III 07/10/2014  . Insomnia w/ sleep apnea 09/12/2013  . Snoring 09/12/2013  . Severe obesity (BMI >= 40) ([place]) 09/12/2013  . Neuropathic pain 01/07/2013  . Breast cancer of upper-outer quadrant of right female breast ([place]) 11/05/2012  . HYPERLIPIDEMIA-MIXED 04/24/2009  . HYPERTENSION, BENIGN 04/24/2009    Amiracle Neises 03/21/2016, 7:44 PM  [place], [place], [address] Phone: [phone]   Fax:  [phone]  Name: Charmelle Soh MRN: [id number] Date of Birth: 12/27/1940 [name], PT 03/21/16 7:45 PM

## 2016-03-21 NOTE — Patient Instructions (Addendum)
Inferior Capsule Stick Stretch I    LIE ON YOUR BACK, dowel in palm of arm to be stretched. Other arm, holding dowel in front of body, pushes outward and upward until arm being stretched is as high to the side as possible.Try to keep the right arm close to the bed, and the elbow pretty straight.  Hold __5_ seconds. Repeat __5-10_ times per session. Do __2_ sessions per day.  Copyright  VHI. All rights reserved.

## 2016-03-23 ENCOUNTER — Ambulatory Visit: Payer: Medicare Other | Admitting: Physical Therapy

## 2016-03-23 DIAGNOSIS — M545 Low back pain: Secondary | ICD-10-CM | POA: Diagnosis not present

## 2016-03-23 DIAGNOSIS — G8929 Other chronic pain: Secondary | ICD-10-CM

## 2016-03-23 DIAGNOSIS — M25611 Stiffness of right shoulder, not elsewhere classified: Secondary | ICD-10-CM

## 2016-03-23 DIAGNOSIS — M25511 Pain in right shoulder: Secondary | ICD-10-CM | POA: Diagnosis not present

## 2016-03-23 NOTE — Therapy (Signed)
[name], [place], [address] Phone: [phone]   Fax:  [phone]  Physical Therapy Treatment  Patient Details  Name: Carrie Key MRN: [id number] Date of Birth: 1940-06-19 Referring Provider: Mike Craze, NP  Encounter Date: 03/23/2016      PT End of Session - 03/23/16 1433    Visit Number 6   Number of Visits 9   Date for PT Re-Evaluation 04/06/16   PT Start Time 8588   PT Stop Time 1430   PT Time Calculation (min) 41 min   Activity Tolerance Patient tolerated treatment well   Behavior During Therapy [facility] for tasks assessed/performed      Past Medical History:  Diagnosis Date  . Breast cancer ([place])   . Cancer ([place])   . CKD (chronic kidney disease), stage III 07/10/2014  . Diabetes mellitus without complication ([place])   . GERD (gastroesophageal reflux disease)   . Hyperlipemia   . Hypertension   . Wears glasses     Past Surgical History:  Procedure Laterality Date  . ABDOMINAL HYSTERECTOMY    . BACK SURGERY  2002   lumbar lam  . CARDIAC CATHETERIZATION  2003  . COLONOSCOPY    . SIMPLE MASTECTOMY WITH AXILLARY SENTINEL NODE BIOPSY Right 12/03/2012   Procedure: RIGHT SIMPLE MASTECTOMY WITH RIGHT  AXILLARY SENTINEL LYMPH NODE BIOPSY;  Surgeon: Shann Medal, MD;  [facility];  Service: General;  Laterality: Right;  . UPPER GI ENDOSCOPY      There were no vitals filed for this visit.      Subjective Assessment - 03/23/16 1351    Subjective "I think I'm a little better.  I know I'm better.  It's not hurting as bad, it's just a little sore."  Did the new exercises at home and thinks it went alright.   Currently in Pain? Yes   Pain Score 4   3-4   Pain Location Arm   Pain Orientation Right   Pain Descriptors / Indicators Sore            [facility] PT Assessment - 03/23/16 0001      AROM   Right Shoulder Flexion 115 Degrees   Right Shoulder ABduction  101 Degrees     PROM   Right Shoulder Flexion 137 Degrees  popping in shoulder   Right Shoulder ABduction 115 Degrees  after stretching   Right Shoulder External Rotation 75 Degrees  approx., after stretching                     [facility] Adult PT Treatment/Exercise - 03/23/16 0001      Shoulder Exercises: Supine   Horizontal ABduction Strengthening;Both;5 reps;Theraband  verbal and manual cueing   Theraband Level (Shoulder Horizontal ABduction) Level 1 (Yellow)   External Rotation Strengthening;Both;5 reps;Theraband   Theraband Level (Shoulder External Rotation) Level 1 (Yellow)   Flexion Strengthening;Both;5 reps;Theraband  narrow and wide grips   Theraband Level (Shoulder Flexion) Level 1 (Yellow)   ABduction AROM;Both     Shoulder Exercises: Seated   Abduction AROM;Both   ABduction Limitations could only do one rep--pain   Other Seated Exercises backward shoulder rolls x 5, forward x 5   Other Seated Exercises elbows at 90 degrees, active shoulder er with scapular retraction x 5     Manual Therapy   Myofascial Release right UE myofascial pulling with movement into abduction   Passive ROM gentle stretch into er, abduction, and flexion  to patient's tolerance; contract-relax for er;  stretch into horizontal abduction                        Long Term Clinic Goals - 03/23/16 1352      CC Long Term Goal  #1   Title Independent with HEP for right shoulder ROM and strengthening.   Status Partially Met     CC Long Term Goal  #3   Title Right shoulder pain decreased at least 60% with movement of right arm   Status Achieved     CC Long Term Goal  #5   Title right shoulder active flexion to at least 125 degrees for improved overhead reach   Status Partially Met     CC Long Term Goal  #6   Title right shoulder active abduction to at least 130 degrees   Status On-going     CC Long Term Goal  #7   Title right shoulder strength grossly at least 4/5    Status On-going            Plan - 03/23/16 1438    Clinical Impression Statement Patient reports significant decrease in her right arm pain, about 60% improved (goal met). Her AROM of right shoulder is not improved this week, but PROM is a little better.  She did better with Theraband scapular stabilization exercises today.  She is making slow but steady progress.   Rehab Potential Good   Clinical Impairments Affecting Rehab Potential none   PT Frequency 2x / week   PT Duration 4 weeks   PT Treatment/Interventions ADLs/Self Care Home Management;Electrical Stimulation;Moist Heat;Therapeutic activities;Therapeutic exercise;Patient/family education;Cryotherapy;Manual techniques;Scar mobilization;Passive range of motion;Taping   PT Next Visit Plan Try supine scapular exercises again vs. yellow Theraband and perhaps print supine scapular HEP and have her do most of it at home.  Continue right shoulder stretches.   PT Home Exercise Plan continue dowel exercises and lower trunk rotation in hooklying   Consulted and Agree with Plan of Care Patient      Patient will benefit from skilled therapeutic intervention in order to improve the following deficits and impairments:  Pain, Decreased range of motion, Decreased strength, Impaired UE functional use, Impaired flexibility  Visit Diagnosis: Stiffness of right shoulder, not elsewhere classified  Chronic right shoulder pain     Problem List Patient Active Problem List   Diagnosis Date Noted  . CKD (chronic kidney disease), stage III 07/10/2014  . Insomnia w/ sleep apnea 09/12/2013  . Snoring 09/12/2013  . Severe obesity (BMI >= 40) ([place]) 09/12/2013  . Neuropathic pain 01/07/2013  . Breast cancer of upper-outer quadrant of right female breast ([place]) 11/05/2012  . HYPERLIPIDEMIA-MIXED 04/24/2009  . HYPERTENSION, BENIGN 04/24/2009    Carrie Key 03/23/2016, 2:42 PM  [place], [place], [address] Phone: [phone]   Fax:  [phone]  Name: Carrie Key MRN: [id number] Date of Birth: 01/30/1941  [name], PT 03/23/16 2:42 PM

## 2016-03-24 ENCOUNTER — Ambulatory Visit: Payer: Medicare Other | Admitting: Physical Therapy

## 2016-03-29 ENCOUNTER — Encounter: Payer: Medicare Other | Admitting: Physical Therapy

## 2016-03-31 ENCOUNTER — Ambulatory Visit: Payer: Medicare Other | Admitting: Physical Therapy

## 2016-03-31 DIAGNOSIS — M545 Low back pain, unspecified: Secondary | ICD-10-CM

## 2016-03-31 DIAGNOSIS — M25611 Stiffness of right shoulder, not elsewhere classified: Secondary | ICD-10-CM | POA: Diagnosis not present

## 2016-03-31 DIAGNOSIS — G8929 Other chronic pain: Secondary | ICD-10-CM | POA: Diagnosis not present

## 2016-03-31 DIAGNOSIS — M25511 Pain in right shoulder: Secondary | ICD-10-CM | POA: Diagnosis not present

## 2016-03-31 NOTE — Therapy (Signed)
[place], [place], [address] Phone: [phone]   Fax:  [phone]  Physical Therapy Treatment  Patient Details  Name: Carrie Key MRN: [id number] Date of Birth: Dec 06, 1940 Referring Provider: Mike Craze, NP  Encounter Date: 03/31/2016      PT End of Session - 03/31/16 1627    Visit Number 7   Number of Visits 9   Date for PT Re-Evaluation 04/06/16   PT Start Time 9702   PT Stop Time 1615   PT Time Calculation (min) 40 min   Activity Tolerance Patient tolerated treatment well   Behavior During Therapy [facility] for tasks assessed/performed      Past Medical History:  Diagnosis Date  . Breast cancer ([place])   . Cancer ([place])   . CKD (chronic kidney disease), stage III 07/10/2014  . Diabetes mellitus without complication ([place])   . GERD (gastroesophageal reflux disease)   . Hyperlipemia   . Hypertension   . Wears glasses     Past Surgical History:  Procedure Laterality Date  . ABDOMINAL HYSTERECTOMY    . BACK SURGERY  2002   lumbar lam  . CARDIAC CATHETERIZATION  2003  . COLONOSCOPY    . SIMPLE MASTECTOMY WITH AXILLARY SENTINEL NODE BIOPSY Right 12/03/2012   Procedure: RIGHT SIMPLE MASTECTOMY WITH RIGHT  AXILLARY SENTINEL LYMPH NODE BIOPSY;  Surgeon: Shann Medal, MD;  Location: [facility];  Service: General;  Laterality: Right;  . UPPER GI ENDOSCOPY      There were no vitals filed for this visit.      Subjective Assessment - 03/31/16 1540    Subjective Pt states she still has pain in her arm when she tries to do her hair    Pertinent History Ms.Burnice Vassel is a 76 y.o. female with history of Stage IIIA right breast invasive ductal carcinoma, ER+/PR+/HER2-, diagnosed in 10/2012; treated with right mastectomy and anti-estrogen therapy with Anastrozole beginning in 01/2013. Pt. has been seen here in the past for her right shoulder also. HTN controlled with meds.  h/o back  surgery in 2000 for her low back; she is not sure what the surgery was, but it did not involve fusion, by my questioning.  That surgery was successful.   Patient Stated Goals "I want this soreness and aching to leave me, especially at night when I go to bed."   Currently in Pain? No/denies                         [facility] Adult PT Treatment/Exercise - 03/31/16 0001      Shoulder Exercises: Supine   External Rotation Strengthening;Both;10 reps;Theraband   Theraband Level (Shoulder External Rotation) Level 1 (Yellow)   Other Supine Exercises small circle with hand pointed to ceiling in each direction    Other Supine Exercises elbow extension with arm pointed toward ceiling. 5 reps with no weight, 5 reps with 2# with cues to keep upper arm stable.      Shoulder Exercises: Seated   Retraction AROM;Both;5 reps   Other Seated Exercises backward shoulder rolls x 5, forward x 5   Other Seated Exercises neck range of motion      Shoulder Exercises: Standing   Extension Strengthening;Both;10 reps;Theraband   Theraband Level (Shoulder Extension) Level 1 (Yellow)   Extension Limitations bilateral rows with elbows extended      Shoulder Exercises: Isometric Strengthening   Flexion 3X5"  standing  Extension 3X5"  in standing   ABduction 3X5"  in standing      Manual Therapy   Joint Mobilization to glenohuneral joint,    Soft tissue mobilization stretching to pec minor with porlonged pressure at coracoid process    Passive ROM gentle stretch into er, abduction, and flexion to patient's tolerance; contract-relax for er;  stretch into horizontal abduction                PT Education - 03/31/16 1626    Education provided Yes   Education Details standing shoulder isometrics    Person(s) Educated Patient   Methods Explanation;Demonstration;Handout   Comprehension Verbalized understanding                [facility] Goals - 03/23/16 1352      CC Long  Term Goal  #1   Title Independent with HEP for right shoulder ROM and strengthening.   Status Partially Met     CC Long Term Goal  #3   Title Right shoulder pain decreased at least 60% with movement of right arm   Status Achieved     CC Long Term Goal  #5   Title right shoulder active flexion to at least 125 degrees for improved overhead reach   Status Partially Met     CC Long Term Goal  #6   Title right shoulder active abduction to at least 130 degrees   Status On-going     CC Long Term Goal  #7   Title right shoulder strength grossly at least 4/5   Status On-going            Plan - 03/31/16 1627    Clinical Impression Statement Good effort today despite not really feeling well.  Upgraded home program to include isometric exercise and added joint mobilizaion and more stretching to right shoulder.  Pt states she can tell a big difference since she started coming here.    Rehab Potential Good   Clinical Impairments Affecting Rehab Potential none   PT Frequency 2x / week   PT Duration 4 weeks   PT Next Visit Plan continue strengthening. Try supine scapular exercises again vs. yellow Theraband and perhaps print supine scapular HEP and have her do most of it at home.  Continue right shoulder stretches.   Consulted and Agree with Plan of Care Patient      Patient will benefit from skilled therapeutic intervention in order to improve the following deficits and impairments:  Pain, Decreased range of motion, Decreased strength, Impaired UE functional use, Impaired flexibility  Visit Diagnosis: Stiffness of right shoulder, not elsewhere classified  Chronic right shoulder pain  Right-sided low back pain without sciatica, unspecified chronicity     Problem List Patient Active Problem List   Diagnosis Date Noted  . CKD (chronic kidney disease), stage III 07/10/2014  . Insomnia w/ sleep apnea 09/12/2013  . Snoring 09/12/2013  . Severe obesity (BMI >= 40) ([place]) 09/12/2013   . Neuropathic pain 01/07/2013  . Breast cancer of upper-outer quadrant of right female breast ([place]) 11/05/2012  . HYPERLIPIDEMIA-MIXED 04/24/2009  . HYPERTENSION, BENIGN 04/24/2009   Donato Heinz. Owens Shark PT  Norwood Levo 03/31/2016, 4:31 PM  [place], [place], [address] Phone: [phone]   Fax:  [phone]  Name: Alayjah Boehringer MRN: [id number] Date of Birth: March 23, 1940

## 2016-03-31 NOTE — Patient Instructions (Signed)

## 2016-04-04 ENCOUNTER — Ambulatory Visit: Payer: Medicare Other | Admitting: Physical Therapy

## 2016-04-04 DIAGNOSIS — G8929 Other chronic pain: Secondary | ICD-10-CM

## 2016-04-04 DIAGNOSIS — M25611 Stiffness of right shoulder, not elsewhere classified: Secondary | ICD-10-CM

## 2016-04-04 DIAGNOSIS — M545 Low back pain: Secondary | ICD-10-CM | POA: Diagnosis not present

## 2016-04-04 DIAGNOSIS — M25511 Pain in right shoulder: Secondary | ICD-10-CM

## 2016-04-04 NOTE — Patient Instructions (Signed)

## 2016-04-04 NOTE — Therapy (Addendum)
[place], [place], [address] Phone: [phone]   Fax:  [phone]  Physical Therapy Treatment  Patient Details  Name: Carrie Key MRN: [id number] Date of Birth: [birth year] Referring Provider: Mike Craze, NP  Encounter Date: 04/04/2016      PT End of Session - 04/04/16 1447    Visit Number 8   Number of Visits 9   Date for PT Re-Evaluation 04/06/16   PT Start Time 1400   PT Stop Time 1446   PT Time Calculation (min) 46 min   Activity Tolerance Patient tolerated treatment well   Behavior During Therapy [facility] for tasks assessed/performed      Past Medical History:  Diagnosis Date  . Breast cancer ()   . Cancer ([place])   . CKD (chronic kidney disease), stage III 07/10/2014  . Diabetes mellitus without complication ([place])   . GERD (gastroesophageal reflux disease)   . Hyperlipemia   . Hypertension   . Wears glasses     Past Surgical History:  Procedure Laterality Date  . ABDOMINAL HYSTERECTOMY    . BACK SURGERY  2002   lumbar lam  . CARDIAC CATHETERIZATION  2003  . COLONOSCOPY    . SIMPLE MASTECTOMY WITH AXILLARY SENTINEL NODE BIOPSY Right 12/03/2012   Procedure: RIGHT SIMPLE MASTECTOMY WITH RIGHT  AXILLARY SENTINEL LYMPH NODE BIOPSY;  Surgeon: Shann Medal, MD;  Location: [facility];  Service: General;  Laterality: Right;  . UPPER GI ENDOSCOPY      There were no vitals filed for this visit.      Subjective Assessment - 04/04/16 1403    Subjective "I think they're going good.  I'm not aching like I was." Says she feels like she knows what to do with the new isometrics HEP.   Currently in Pain? Yes   Pain Score 3   2-3   Pain Location Arm   Pain Orientation Right   Pain Descriptors / Indicators Sore   Pain Relieving Factors therapy             [facility] PT Assessment - 04/04/16 0001      AROM   Right Shoulder Flexion 113 Degrees   Right Shoulder  ABduction 120 Degrees     Strength   Overall Strength Comments right shoulder strength remains grossly 3-/5                     [facility] Adult PT Treatment/Exercise - 04/04/16 0001      Shoulder Exercises: Supine   Horizontal ABduction Strengthening;Both;5 reps;Theraband   Theraband Level (Shoulder Horizontal ABduction) Level 1 (Yellow)   External Rotation Strengthening;Both;5 reps;Theraband   Theraband Level (Shoulder External Rotation) Level 1 (Yellow)   Flexion Strengthening;Both;5 reps;Theraband  narrow and wide grips on band   Theraband Level (Shoulder Flexion) Level 1 (Yellow)   Other Supine Exercises active D2 with each arm vs. yellow Theraband x 5     Manual Therapy   Myofascial Release right UE myofascial pulling with movement into abduction   Scapular Mobilization in left sidelying, to right scapula with gentle rocking motions into protraction at different angles, and depression; also worked at trying to lift medial border of scapula manually and stretch   Passive ROM gentle stretch into er, abduction, and flexion to patient's tolerance; contract-relax for er;  stretch into horizontal abduction; in left sidelying, right shoulder abduction with passive scapular rotation during this motion  PT Education - 04/04/16 1446    Education provided Yes   Education Details supine scapular series vs. yellow Theraband   Person(s) Educated Patient   Methods Explanation;Demonstration;Verbal cues;Handout   Comprehension Verbalized understanding;Returned demonstration                Long Term Clinic Goals - 04/04/16 1444      CC Long Term Goal  #6   Title right shoulder active abduction to at least 130 degrees   Baseline 115 at eval compared to 135 on left; 120 on 04/04/16   Status Partially Met     CC Long Term Goal  #7   Title right shoulder strength grossly at least 4/5   Status On-going            Plan - 04/04/16 1448    Clinical  Impression Statement Patient did much better with supine scapular series today than two sessions ago when these were tried; they were added to HEP.  Right shoulder active abduction improved today, but strength remains at 3-/5 in right shoulder. She is due for renewal or discharge at next visit; therapist didn't recognize this today to talk to the patient about it.  Pt. has made slow but steady progress and therapist would recommend continuing therapy.   Rehab Potential Good   Clinical Impairments Affecting Rehab Potential none   PT Frequency 2x / week   PT Duration 4 weeks   PT Treatment/Interventions ADLs/Self Care Home Management;Electrical Stimulation;Moist Heat;Therapeutic activities;Therapeutic exercise;Patient/family education;Cryotherapy;Manual techniques;Scar mobilization;Passive range of motion;Taping   PT Next Visit Plan Recheck right shoulder active flexion.  Continue stretching and strengthening for right shoulder.  Will need renewal at next visit unless patient chooses discharge.   PT Home Exercise Plan continue dowel exercises and lower trunk rotation in hooklying, supine scapular series, and isometrics for shoulder   Consulted and Agree with Plan of Care Patient      Patient will benefit from skilled therapeutic intervention in order to improve the following deficits and impairments:  Pain, Decreased range of motion, Decreased strength, Impaired UE functional use, Impaired flexibility  Visit Diagnosis: Stiffness of right shoulder, not elsewhere classified  Chronic right shoulder pain     Problem List Patient Active Problem List   Diagnosis Date Noted  . CKD (chronic kidney disease), stage III 07/10/2014  . Insomnia w/ sleep apnea 09/12/2013  . Snoring 09/12/2013  . Severe obesity (BMI >= 40) ([place]) 09/12/2013  . Neuropathic pain 01/07/2013  . Breast cancer of upper-outer quadrant of right female breast ([place]) 11/05/2012  . HYPERLIPIDEMIA-MIXED 04/24/2009  . HYPERTENSION,  BENIGN 04/24/2009    SALIS[place],DONNA 04/04/2016, 2:52 PM  [place], [place], [address] Phone: [phone]   Fax:  [phone]  Name: Carrie Key MRN: [id number] Date of Birth: 1940-07-24  [name], PT 04/04/16 2:53 PM  PHYSICAL THERAPY DISCHARGE SUMMARY  Visits from Start of Care: 8  Current functional level related to goals / functional outcomes: Goals partially met as noted above.   Remaining deficits: Unknown: pt. did not return for formal discharge. She should have had one more visit for either discharge or renewal, but did not.   Education / Equipment: Home exercise program Plan: Patient agrees to discharge.  Patient goals were partially met. Patient is being discharged due to not returning since the last visit.  ?????    [name], PT 05/04/17 1:17 PM

## 2016-04-05 ENCOUNTER — Other Ambulatory Visit: Payer: Self-pay | Admitting: Oncology

## 2016-04-05 DIAGNOSIS — Z853 Personal history of malignant neoplasm of breast: Secondary | ICD-10-CM

## 2016-04-05 DIAGNOSIS — C50411 Malignant neoplasm of upper-outer quadrant of right female breast: Secondary | ICD-10-CM

## 2016-04-08 ENCOUNTER — Other Ambulatory Visit: Payer: Self-pay | Admitting: Emergency Medicine

## 2016-04-08 DIAGNOSIS — Z853 Personal history of malignant neoplasm of breast: Secondary | ICD-10-CM

## 2016-04-08 DIAGNOSIS — C50411 Malignant neoplasm of upper-outer quadrant of right female breast: Secondary | ICD-10-CM

## 2016-04-08 MED ORDER — GABAPENTIN 300 MG PO CAPS: 300.0000 mg | ORAL_CAPSULE | Freq: Every day | ORAL | 6 refills | Status: DC

## 2016-04-26 ENCOUNTER — Observation Stay (HOSPITAL_COMMUNITY)
Admission: EM | Admit: 2016-04-26 | Discharge: 2016-04-27 | Disposition: A | Discharge status: Home / Self Care | Payer: Medicare Other | Attending: Cardiology | Admitting: Cardiology

## 2016-04-26 ENCOUNTER — Encounter (HOSPITAL_COMMUNITY): Payer: Self-pay

## 2016-04-26 ENCOUNTER — Emergency Department (HOSPITAL_COMMUNITY): Payer: Medicare Other

## 2016-04-26 DIAGNOSIS — I509 Heart failure, unspecified: Secondary | ICD-10-CM

## 2016-04-26 DIAGNOSIS — K219 Gastro-esophageal reflux disease without esophagitis: Secondary | ICD-10-CM | POA: Diagnosis not present

## 2016-04-26 DIAGNOSIS — Z6833 Body mass index (BMI) 33.0-33.9, adult: Secondary | ICD-10-CM | POA: Diagnosis not present

## 2016-04-26 DIAGNOSIS — I130 Hypertensive heart and chronic kidney disease with heart failure and stage 1 through stage 4 chronic kidney disease, or unspecified chronic kidney disease: Secondary | ICD-10-CM | POA: Diagnosis not present

## 2016-04-26 DIAGNOSIS — R0683 Snoring: Secondary | ICD-10-CM | POA: Diagnosis present

## 2016-04-26 DIAGNOSIS — Z79899 Other long term (current) drug therapy: Secondary | ICD-10-CM | POA: Diagnosis not present

## 2016-04-26 DIAGNOSIS — Z823 Family history of stroke: Secondary | ICD-10-CM | POA: Insufficient documentation

## 2016-04-26 DIAGNOSIS — Z79811 Long term (current) use of aromatase inhibitors: Secondary | ICD-10-CM | POA: Insufficient documentation

## 2016-04-26 DIAGNOSIS — I4891 Unspecified atrial fibrillation: Secondary | ICD-10-CM

## 2016-04-26 DIAGNOSIS — E1140 Type 2 diabetes mellitus with diabetic neuropathy, unspecified: Secondary | ICD-10-CM | POA: Diagnosis not present

## 2016-04-26 DIAGNOSIS — E782 Mixed hyperlipidemia: Secondary | ICD-10-CM | POA: Diagnosis not present

## 2016-04-26 DIAGNOSIS — I110 Hypertensive heart disease with heart failure: Secondary | ICD-10-CM | POA: Diagnosis not present

## 2016-04-26 DIAGNOSIS — I480 Paroxysmal atrial fibrillation: Secondary | ICD-10-CM | POA: Diagnosis not present

## 2016-04-26 DIAGNOSIS — Z853 Personal history of malignant neoplasm of breast: Secondary | ICD-10-CM | POA: Insufficient documentation

## 2016-04-26 DIAGNOSIS — E876 Hypokalemia: Secondary | ICD-10-CM | POA: Diagnosis not present

## 2016-04-26 DIAGNOSIS — D649 Anemia, unspecified: Secondary | ICD-10-CM | POA: Insufficient documentation

## 2016-04-26 DIAGNOSIS — I10 Essential (primary) hypertension: Secondary | ICD-10-CM

## 2016-04-26 DIAGNOSIS — E785 Hyperlipidemia, unspecified: Secondary | ICD-10-CM | POA: Diagnosis present

## 2016-04-26 DIAGNOSIS — N183 Chronic kidney disease, stage 3 unspecified: Secondary | ICD-10-CM | POA: Diagnosis present

## 2016-04-26 DIAGNOSIS — E1122 Type 2 diabetes mellitus with diabetic chronic kidney disease: Secondary | ICD-10-CM | POA: Diagnosis not present

## 2016-04-26 DIAGNOSIS — R0602 Shortness of breath: Secondary | ICD-10-CM | POA: Diagnosis not present

## 2016-04-26 DIAGNOSIS — Z7901 Long term (current) use of anticoagulants: Secondary | ICD-10-CM | POA: Diagnosis not present

## 2016-04-26 DIAGNOSIS — Z87891 Personal history of nicotine dependence: Secondary | ICD-10-CM | POA: Diagnosis not present

## 2016-04-26 DIAGNOSIS — I5031 Acute diastolic (congestive) heart failure: Secondary | ICD-10-CM | POA: Diagnosis not present

## 2016-04-26 DIAGNOSIS — Z8249 Family history of ischemic heart disease and other diseases of the circulatory system: Secondary | ICD-10-CM | POA: Diagnosis not present

## 2016-04-26 DIAGNOSIS — Z7984 Long term (current) use of oral hypoglycemic drugs: Secondary | ICD-10-CM | POA: Diagnosis not present

## 2016-04-26 DIAGNOSIS — R000 Tachycardia, unspecified: Secondary | ICD-10-CM | POA: Diagnosis not present

## 2016-04-26 HISTORY — DX: Unspecified atrial fibrillation: I48.91

## 2016-04-26 HISTORY — DX: Cardiac murmur, unspecified: R01.1

## 2016-04-26 LAB — BASIC METABOLIC PANEL
ANION GAP: 18 — AB (ref 5–15)
BUN: 17 mg/dL (ref 6–20)
CALCIUM: 9.4 mg/dL (ref 8.9–10.3)
CHLORIDE: 105 mmol/L (ref 101–111)
CO2: 19 mmol/L — AB (ref 22–32)
CREATININE: 1.23 mg/dL — AB (ref 0.44–1.00)
GFR calc non Af Amer: 42 mL/min — ABNORMAL LOW (ref >60)
GFR, EST AFRICAN AMERICAN: 49 mL/min — AB (ref >60)
Glucose, Bld: 103 mg/dL — ABNORMAL HIGH (ref 65–99)
Potassium: 3.4 mmol/L — ABNORMAL LOW (ref 3.5–5.1)
SODIUM: 142 mmol/L (ref 135–145)

## 2016-04-26 LAB — GLUCOSE, CAPILLARY: Glucose-Capillary: 136 mg/dL — ABNORMAL HIGH (ref 65–99)

## 2016-04-26 LAB — CBC
HCT: 33.4 % — ABNORMAL LOW (ref 36.0–46.0)
HEMOGLOBIN: 10.9 g/dL — AB (ref 12.0–15.0)
MCH: 30.3 pg (ref 26.0–34.0)
MCHC: 32.6 g/dL (ref 30.0–36.0)
MCV: 92.8 fL (ref 78.0–100.0)
PLATELETS: 188 10*3/uL (ref 150–400)
RBC: 3.6 MIL/uL — AB (ref 3.87–5.11)
RDW: 13.2 % (ref 11.5–15.5)
WBC: 8.6 10*3/uL (ref 4.0–10.5)

## 2016-04-26 LAB — TROPONIN I
TROPONIN I: 0.04 ng/mL — AB (ref <0.03)
TROPONIN I: 0.07 ng/mL — AB (ref <0.03)
Troponin I: 0.05 ng/mL (ref <0.03)

## 2016-04-26 LAB — TSH: TSH: 1.5 u[IU]/mL (ref 0.350–4.500)

## 2016-04-26 LAB — MAGNESIUM: MAGNESIUM: 1.5 mg/dL — AB (ref 1.7–2.4)

## 2016-04-26 LAB — BRAIN NATRIURETIC PEPTIDE: B NATRIURETIC PEPTIDE 5: 560.8 pg/mL — AB (ref 0.0–100.0)

## 2016-04-26 MED ORDER — POTASSIUM CHLORIDE CRYS ER 20 MEQ PO TBCR
20.0000 meq | EXTENDED_RELEASE_TABLET | Freq: Every day | ORAL | Status: DC
  Filled 2016-04-26: qty 1

## 2016-04-26 MED ORDER — ASPIRIN EC 81 MG PO TBEC: 81.0000 mg | DELAYED_RELEASE_TABLET | Freq: Every day | ORAL | Status: DC

## 2016-04-26 MED ORDER — INSULIN ASPART 100 UNIT/ML ~~LOC~~ SOLN
0.0000 [IU] | Freq: Three times a day (TID) | SUBCUTANEOUS | Status: DC
  Administered 2016-04-26 – 2016-04-27 (×2): 1 [IU] via SUBCUTANEOUS

## 2016-04-26 MED ORDER — POTASSIUM CHLORIDE CRYS ER 20 MEQ PO TBCR
40.0000 meq | EXTENDED_RELEASE_TABLET | Freq: Once | ORAL | Status: AC
  Administered 2016-04-26: 40 meq via ORAL
  Filled 2016-04-26: qty 2

## 2016-04-26 MED ORDER — GABAPENTIN 300 MG PO CAPS
300.0000 mg | ORAL_CAPSULE | Freq: Every day | ORAL | Status: DC
  Administered 2016-04-26: 300 mg via ORAL
  Filled 2016-04-26: qty 1

## 2016-04-26 MED ORDER — ANASTROZOLE 1 MG PO TABS
1.0000 mg | ORAL_TABLET | Freq: Every day | ORAL | Status: DC
  Administered 2016-04-26 – 2016-04-27 (×2): 1 mg via ORAL
  Filled 2016-04-26 (×2): qty 1

## 2016-04-26 MED ORDER — FUROSEMIDE 10 MG/ML IJ SOLN
20.0000 mg | Freq: Every day | INTRAMUSCULAR | Status: DC
  Administered 2016-04-26: 20 mg via INTRAVENOUS
  Filled 2016-04-26 (×2): qty 2

## 2016-04-26 MED ORDER — SODIUM CHLORIDE 0.9% FLUSH: 3.0000 mL | INTRAVENOUS | Status: DC | PRN

## 2016-04-26 MED ORDER — DILTIAZEM LOAD VIA INFUSION
15.0000 mg | Freq: Once | INTRAVENOUS | Status: AC
  Administered 2016-04-26: 15 mg via INTRAVENOUS
  Filled 2016-04-26: qty 15

## 2016-04-26 MED ORDER — SODIUM CHLORIDE 0.9 % IV SOLN: 250.0000 mL | INTRAVENOUS | Status: DC | PRN

## 2016-04-26 MED ORDER — ACETAMINOPHEN 325 MG PO TABS: 650.0000 mg | ORAL_TABLET | ORAL | Status: DC | PRN

## 2016-04-26 MED ORDER — DILTIAZEM HCL-DEXTROSE 100-5 MG/100ML-% IV SOLN (PREMIX)
5.0000 mg/h | INTRAVENOUS | Status: DC
  Administered 2016-04-26: 10 mg/h via INTRAVENOUS
  Administered 2016-04-26: 5 mg/h via INTRAVENOUS
  Administered 2016-04-27: 10 mg/h via INTRAVENOUS
  Filled 2016-04-26 (×3): qty 100

## 2016-04-26 MED ORDER — ONDANSETRON HCL 4 MG/2ML IJ SOLN: 4.0000 mg | Freq: Four times a day (QID) | INTRAMUSCULAR | Status: DC | PRN

## 2016-04-26 MED ORDER — TRAMADOL HCL 50 MG PO TABS: 50.0000 mg | ORAL_TABLET | Freq: Four times a day (QID) | ORAL | Status: DC | PRN

## 2016-04-26 MED ORDER — APIXABAN 5 MG PO TABS
5.0000 mg | ORAL_TABLET | Freq: Two times a day (BID) | ORAL | Status: DC
  Administered 2016-04-26 – 2016-04-27 (×3): 5 mg via ORAL
  Filled 2016-04-26 (×4): qty 1

## 2016-04-26 MED ORDER — CLONIDINE HCL 0.2 MG PO TABS
0.3000 mg | ORAL_TABLET | Freq: Two times a day (BID) | ORAL | Status: DC
  Administered 2016-04-26 – 2016-04-27 (×3): 0.3 mg via ORAL
  Filled 2016-04-26 (×4): qty 1

## 2016-04-26 MED ORDER — ROSUVASTATIN CALCIUM 10 MG PO TABS
10.0000 mg | ORAL_TABLET | Freq: Every day | ORAL | Status: DC
  Filled 2016-04-26 (×2): qty 1

## 2016-04-26 MED ORDER — PANTOPRAZOLE SODIUM 40 MG PO TBEC
40.0000 mg | DELAYED_RELEASE_TABLET | Freq: Every day | ORAL | Status: DC
  Administered 2016-04-26 – 2016-04-27 (×2): 40 mg via ORAL
  Filled 2016-04-26 (×2): qty 1

## 2016-04-26 MED ORDER — DOCUSATE SODIUM 100 MG PO CAPS: 200.0000 mg | ORAL_CAPSULE | Freq: Two times a day (BID) | ORAL | Status: DC | PRN

## 2016-04-26 MED ORDER — SODIUM CHLORIDE 0.9% FLUSH
3.0000 mL | Freq: Two times a day (BID) | INTRAVENOUS | Status: DC
  Administered 2016-04-26: 3 mL via INTRAVENOUS

## 2016-04-26 MED ORDER — ROSUVASTATIN CALCIUM 10 MG PO TABS
10.0000 mg | ORAL_TABLET | Freq: Every day | ORAL | Status: DC
  Administered 2016-04-26: 10 mg via ORAL
  Filled 2016-04-26: qty 1

## 2016-04-26 MED ORDER — CLONIDINE HCL 0.2 MG PO TABS: 0.3000 mg | ORAL_TABLET | Freq: Two times a day (BID) | ORAL | Status: DC

## 2016-04-26 NOTE — ED Notes (Signed)
Attempted report 

## 2016-04-26 NOTE — ED Notes (Signed)
This RN at bedside assisting pt with bedside commode. Pt dyspneic with excertion. Denies chest pain. Nasal canula on pt. Urine collected. Pt placed back in bed.

## 2016-04-26 NOTE — ED Notes (Signed)
Lab at bedside for collection

## 2016-04-26 NOTE — ED Notes (Signed)
EDP [facility] notified of critical trop value.

## 2016-04-26 NOTE — ED Provider Notes (Signed)
[facility] DEPT Provider Note   CSN: [id number] Arrival date & time: 04/26/16  0904     History   Chief Complaint Chief Complaint  Patient presents with  . Shortness of Breath    HPI Carrie Key is a 76 y.o. female.  She presents for evaluation of a sensation of esophageal reflux, and rapid heartbeat.  She denies "chest pain".  Symptoms started spontaneously, this morning.  No prior similar problems.  No recent illnesses including fever chills nausea vomiting cough shortness of breath or chest pain.  No prior history of atrial fibrillation.  There are no other known modifying factors.  HPI  Past Medical History:  Diagnosis Date  . Breast cancer ([place])   . Cancer ([place])   . CKD (chronic kidney disease), stage III 07/10/2014  . Diabetes mellitus without complication ([place])   . GERD (gastroesophageal reflux disease)   . Hyperlipemia   . Hypertension   . Wears glasses     Patient Active Problem List   Diagnosis Date Noted  . CKD (chronic kidney disease), stage III 07/10/2014  . Insomnia w/ sleep apnea 09/12/2013  . Snoring 09/12/2013  . Severe obesity (BMI >= 40) ([place]) 09/12/2013  . Neuropathic pain 01/07/2013  . Breast cancer of upper-outer quadrant of right female breast ([place]) 11/05/2012  . HYPERLIPIDEMIA-MIXED 04/24/2009  . HYPERTENSION, BENIGN 04/24/2009    Past Surgical History:  Procedure Laterality Date  . ABDOMINAL HYSTERECTOMY    . BACK SURGERY  2002   lumbar lam  . CARDIAC CATHETERIZATION  2003  . COLONOSCOPY    . SIMPLE MASTECTOMY WITH AXILLARY SENTINEL NODE BIOPSY Right 12/03/2012   Procedure: RIGHT SIMPLE MASTECTOMY WITH RIGHT  AXILLARY SENTINEL LYMPH NODE BIOPSY;  Surgeon: Shann Medal, MD;  Location: [facility];  Service: General;  Laterality: Right;  . UPPER GI ENDOSCOPY      OB History    No data available       Home Medications    Prior to Admission medications   Medication Sig Start Date End Date Taking? Authorizing  Provider  amLODipine (NORVASC) 10 MG tablet Take 10 mg by mouth daily.   Yes Historical Provider, MD  anastrozole (ARIMIDEX) 1 MG tablet Take 1 tablet (1 mg total) by mouth daily. 08/17/15  Yes Sylvan Cheese, NP  Azilsartan-Chlorthalidone 40-12.5 MG TABS Take 1 tablet by mouth daily.   Yes Historical Provider, MD  cloNIDine (CATAPRES) 0.3 MG tablet Take 0.3 mg by mouth 2 (two) times daily.   Yes Willey Blade, MD  docusate sodium (COLACE) 100 MG capsule Take 200 mg by mouth 2 (two) times daily as needed for mild constipation.   Yes Historical Provider, MD  fluconazole (DIFLUCAN) 100 MG tablet Take 1 tablet (100 mg total) by mouth daily. 10/29/13  Yes Chauncey Cruel, MD  gabapentin (NEURONTIN) 300 MG capsule Take 1 capsule (300 mg total) by mouth at bedtime. 04/08/16  Yes Chauncey Cruel, MD  glimepiride (AMARYL) 1 MG tablet Take 1 mg by mouth daily with breakfast.   Yes Historical Provider, MD  ibuprofen (ADVIL,MOTRIN) 200 MG tablet Take 400 mg by mouth every 6 (six) hours as needed for mild pain or moderate pain.   Yes Historical Provider, MD  omeprazole (PRILOSEC) 40 MG capsule Take 40 mg by mouth daily.    Yes Ronald Lobo, MD  pioglitazone (ACTOS) 45 MG tablet Take 45 mg by mouth daily.   Yes Historical Provider, MD  rosuvastatin (CRESTOR) 10 MG tablet Take  10 mg by mouth daily.   Yes Willey Blade, MD  traMADol (ULTRAM) 50 MG tablet Take 1 tablet (50 mg total) by mouth every 6 (six) hours as needed. for pain 02/18/16  Yes Holley Bouche, NP    Family History Family History  Problem Relation Age of Onset  . Hypertension Mother   . Hypertension Father   . Hypertension Paternal Grandmother     Social History Social History  Substance Use Topics  . Smoking status: Former Smoker    Packs/day: 0.30    Years: 30.00    Types: Cigarettes    Quit date: 11/26/1988  . Smokeless tobacco: Never Used  . Alcohol use 2.4 oz/week    4 Glasses of wine per week     Comment:  occ     Allergies   Patient has no known allergies.   Review of Systems Review of Systems  All other systems reviewed and are negative.    Physical Exam Updated Vital Signs BP (!) 194/118   Pulse (!) 117   Temp 99.5 F (37.5 C) (Oral)   Resp (!) 31   SpO2 100%   Physical Exam  Constitutional: She is oriented to person, place, and time. She appears well-developed.  Elderly, obese  HENT:  Head: Normocephalic and atraumatic.  Eyes: Conjunctivae and EOM are normal. Pupils are equal, round, and reactive to light.  Neck: Normal range of motion and phonation normal. Neck supple.  Cardiovascular: Exam reveals no friction rub.   No murmur heard. Regular tachycardia  Pulmonary/Chest: Effort normal and breath sounds normal. No respiratory distress. She exhibits no tenderness.  Abdominal: Soft. She exhibits no distension. There is no tenderness. There is no guarding.  Musculoskeletal: Normal range of motion. She exhibits edema (2+ lower legs).  Neurological: She is alert and oriented to person, place, and time. She exhibits normal muscle tone.  Skin: Skin is warm and dry.  Psychiatric: She has a normal mood and affect. Her behavior is normal. Judgment and thought content normal.  Nursing note and vitals reviewed.    ED Treatments / Results  Labs (all labs ordered are listed, but only abnormal results are displayed) Labs Reviewed  BASIC METABOLIC PANEL - Abnormal; Notable for the following:       Result Value   Potassium 3.4 (*)    CO2 19 (*)    Glucose, Bld 103 (*)    Creatinine, Ser 1.23 (*)    GFR calc non Af Amer 42 (*)    GFR calc Af Amer 49 (*)    Anion gap 18 (*)    All other components within normal limits  CBC - Abnormal; Notable for the following:    RBC 3.60 (*)    Hemoglobin 10.9 (*)    HCT 33.4 (*)    All other components within normal limits  TROPONIN I - Abnormal; Notable for the following:    Troponin I 0.04 (*)    All other components within normal  limits    EKG  EKG Interpretation  Date/Time:  Tuesday April 26 2016 09:13:01 EDT Ventricular Rate:  121 PR Interval:    QRS Duration: 97 QT Interval:  322 QTC Calculation: 457 R Axis:   42 Text Interpretation:  Atrial fibrillation Minimal ST depression, lateral leads Confirmed by Eulis Foster  MD, Colter Magowan [phone]) on 04/26/2016 12:12:56 PM       Radiology Dg Chest 2 View  Result Date: 04/26/2016 CLINICAL DATA:  Shortness of breath. EXAM: CHEST  2  VIEW COMPARISON:  06/21/2014 . FINDINGS: Mediastinum hilar structures normal. Cardiomegaly with mild pulmonary vascular prominence and bilateral interstitial prominence consistent mild CHF. No pleural effusion or pneumothorax. IMPRESSION: Congestive heart failure with mild pulmonary interstitial edema. Electronically Signed   By: Marcello Moores  Register   On: 04/26/2016 10:08    Procedures Procedures (including critical care time)  Medications Ordered in ED Medications  diltiazem (CARDIZEM) 1 mg/mL load via infusion 15 mg (15 mg Intravenous Bolus from Bag 04/26/16 1153)    And  diltiazem (CARDIZEM) 100 mg in dextrose 5% 156mL (1 mg/mL) infusion (5 mg/hr Intravenous New Bag/Given 04/26/16 1151)     Initial Impression / Assessment and Plan / ED Course  I have reviewed the triage vital signs and the nursing notes.  Pertinent labs & imaging results that were available during my care of the patient were reviewed by me and considered in my medical decision making (see chart for details).  Clinical Course as of Apr 27 1211  Tue Apr 26, 2016  1039 Patient with feeling of esophageal reflux, and sensation of palpitation, without "chest pain".  Symptoms started this morning.  No prior similar problems.  No recent illnesses.  [EW]  1206 Case was discussed with [facility] hospitalist, who feels like the patient is not complicated, has an isolated cardiac problem, and could be managed by cardiology.  Therefore he requests that I contact them for evaluation and  treatment.  [EW]  1212 Cardiology contacted and they will admit the patient  [EW]    Clinical Course User Index [EW] Daleen Bo, MD    Medications  diltiazem (CARDIZEM) 1 mg/mL load via infusion 15 mg (15 mg Intravenous Bolus from Bag 04/26/16 1153)    And  diltiazem (CARDIZEM) 100 mg in dextrose 5% 163mL (1 mg/mL) infusion (5 mg/hr Intravenous New Bag/Given 04/26/16 1151)    Patient Vitals for the past 24 hrs:  BP Temp Temp src Pulse Resp SpO2  04/26/16 1115 (!) 194/118 - - (!) 117 (!) 31 100 %  04/26/16 1100 (!) 203/118 - - 95 (!) 29 100 %  04/26/16 1045 (!) 180/111 - - (!) 113 20 100 %  04/26/16 1035 (!) 161/104 - - 98 19 100 %  04/26/16 0916 (!) 164/95 - - (!) 105 (!) 26 100 %  04/26/16 0907 (!) 173/100 99.5 F (37.5 C) Oral (!) 148 (!) 25 100 %   This patients CHA2DS2-VASc Score and unadjusted Ischemic Stroke Rate (% per year) is equal to 7.2 % stroke rate/year from a score of 5  Above score calculated as 1 point each if present [CHF, HTN, DM, Vascular=MI/PAD/Aortic Plaque, Age if 65-74, or Female] Above score calculated as 2 points each if present [Age > 75, or Stroke/TIA/TE]   11:37 AM Reevaluation with update and discussion. After initial assessment and treatment, an updated evaluation reveals she remains tachycardic, with normal oxygenation, on nasal cannula supplementation.  Blood pressure remains elevated.  Patient updated on findings and plan.  Questions answered. Ross Hefferan L   11:45 AM-Consult complete with cardiology. Patient case explained and discussed.  She agrees to admit patient for further evaluation and treatment. Call ended at 12: 12   Final Clinical Impressions(s) / ED Diagnoses   Final diagnoses:  Atrial fibrillation with RVR (HCC)  Congestive heart failure, unspecified congestive heart failure chronicity, unspecified congestive heart failure type (HCC)    Palpitations with mild chest discomfort and new onset atrial fibrillation with rapid  ventricular response.  Mild fluid overload, on chest  x-ray.  No known history of congestive heart failure however she appears to be in mild failure which is likely secondary to the atrial fibrillation.  Initial troponin is borderline high.  This will need to be trended. she will require admission for stabilization.  Nursing Notes Reviewed/ Care Coordinated Applicable Imaging Reviewed Interpretation of Laboratory Data incorporated into ED treatment  Plan: Admit  New Prescriptions New Prescriptions   No medications on file     Daleen Bo, MD 04/26/16 1213

## 2016-04-26 NOTE — ED Notes (Signed)
Patient transported to X-ray 

## 2016-04-26 NOTE — H&P (Signed)
History & Physical    Patient ID: Carrie Key MRN: [id number], DOB/AGE: [birth year]   Admit date: 04/26/2016   Primary Physician: Maximino Greenland, MD Primary Cardiologist: New  History of Present Illness    Carrie Key is a 76 y.o. female with past medical history of Hypertension, hyperlipidemia, GERD, CKD stage III, type 2 diabetes, breast cancer with simple mastectomy in 2014 who presented with sensation of esophageal reflux and rapid heartbeat.  She awoke at 1 am with palpitations and shortness of breath. She was found to be in afib with RVR on arrival to ED. She had mild chest tightness that resolved with reduction in rate. She continues to have mild shortness of breath. Prior to this episode she has had no recent increase in her baseline mild exertional shortness of breath or chest discomfort. She has had no lightheadedness, near syncope or syncope. She has 2 pillow orthopnea, rare PND and mild pedal edema. She has had one similar episode about 3 months ago that was brief, resolved spontaneously and she did not seek treatment for. She has no history of MI or cardiac events or previously diagnosed arrhythmia. She was seen remotely in the past by a cardiologist at which time she was given potassium and a blood pressure lowering medication. She smoked off-and-on until quitting in 1989 she drinks 2-3 gin drinks on weekends.  She is 1 of 10 children. 8 have passed away and all of them had multiple medical problems including a few that had heart problems but she does not know the details. Her mother had HTN and stroke. Her father had MI and HTN.  EKG shows afib with rapid Ventricular response rate of 121 bpm First troponin is 0.04, SCr 1.23, K+ 3.4 CXR: Congestive heart failure with mild pulmonary interstitial edema.   Past Medical History    Past Medical History:  Diagnosis Date  . Breast cancer ([place])   . Cancer ([place])   . CKD (chronic kidney disease), stage III 07/10/2014  .  Diabetes mellitus without complication ([place])   . GERD (gastroesophageal reflux disease)   . Hyperlipemia   . Hypertension   . Wears glasses     Past Surgical History:  Procedure Laterality Date  . ABDOMINAL HYSTERECTOMY    . BACK SURGERY  2002   lumbar lam  . CARDIAC CATHETERIZATION  2003  . COLONOSCOPY    . SIMPLE MASTECTOMY WITH AXILLARY SENTINEL NODE BIOPSY Right 12/03/2012   Procedure: RIGHT SIMPLE MASTECTOMY WITH RIGHT  AXILLARY SENTINEL LYMPH NODE BIOPSY;  Surgeon: Shann Medal, MD;  Location: [facility];  Service: General;  Laterality: Right;  . UPPER GI ENDOSCOPY       Allergies  No Known Allergies   Home Medications    Prior to Admission medications   Medication Sig Start Date End Date Taking? Authorizing Provider  amLODipine (NORVASC) 10 MG tablet Take 10 mg by mouth daily.   Yes Historical Provider, MD  anastrozole (ARIMIDEX) 1 MG tablet Take 1 tablet (1 mg total) by mouth daily. 08/17/15  Yes Sylvan Cheese, NP  Azilsartan-Chlorthalidone 40-12.5 MG TABS Take 1 tablet by mouth daily.   Yes Historical Provider, MD  cloNIDine (CATAPRES) 0.3 MG tablet Take 0.3 mg by mouth 2 (two) times daily.   Yes Willey Blade, MD  docusate sodium (COLACE) 100 MG capsule Take 200 mg by mouth 2 (two) times daily as needed for mild constipation.   Yes Historical Provider, MD  fluconazole (DIFLUCAN) 100 MG  tablet Take 1 tablet (100 mg total) by mouth daily. 10/29/13  Yes Chauncey Cruel, MD  gabapentin (NEURONTIN) 300 MG capsule Take 1 capsule (300 mg total) by mouth at bedtime. 04/08/16  Yes Chauncey Cruel, MD  glimepiride (AMARYL) 1 MG tablet Take 1 mg by mouth daily with breakfast.   Yes Historical Provider, MD  ibuprofen (ADVIL,MOTRIN) 200 MG tablet Take 400 mg by mouth every 6 (six) hours as needed for mild pain or moderate pain.   Yes Historical Provider, MD  omeprazole (PRILOSEC) 40 MG capsule Take 40 mg by mouth daily.    Yes Ronald Lobo, MD    pioglitazone (ACTOS) 45 MG tablet Take 45 mg by mouth daily.   Yes Historical Provider, MD  rosuvastatin (CRESTOR) 10 MG tablet Take 10 mg by mouth daily.   Yes Willey Blade, MD  traMADol (ULTRAM) 50 MG tablet Take 1 tablet (50 mg total) by mouth every 6 (six) hours as needed. for pain 02/18/16  Yes Holley Bouche, NP    Family History    Family History  Problem Relation Age of Onset  . Hypertension Mother   . Stroke Mother   . Hypertension Father   . Heart attack Father   . Hypertension Paternal Grandmother     Social History    Social History   Social History  . Marital status: Widowed    Spouse name: N/A  . Number of children: 5  . Years of education: 31   Occupational History  . Retired     disability prior to retirement   Social History Main Topics  . Smoking status: Former Smoker    Packs/day: 0.30    Years: 30.00    Types: Cigarettes    Quit date: 11/26/1988  . Smokeless tobacco: Never Used  . Alcohol use 2.4 oz/week    4 Glasses of wine per week     Comment: occ  . Drug use: No  . Sexual activity: Yes    Birth control/ protection: Post-menopausal   Other Topics Concern  . Not on file   Social History Narrative   Patient is single and lives alone.   Patient is retired.   Patient has five adult children.   Patient has a 11 grade education   Patient is right-handed.   Patient drinks one cup of coffee and two cups of tea daily.     Review of Systems   General:  No chills, fever, night sweats or weight changes.  Cardiovascular:  Positive for Mild dyspnea on exertion, mild edema, two-pillow orthopnea, palpitations, rare paroxysmal nocturnal dyspnea. Dermatological: No rash, lesions/masses Respiratory: No cough, mild dyspnea on exertion for many years, unchanged Urologic: No hematuria, dysuria Abdominal:   No nausea, vomiting, diarrhea, bright red blood per rectum, melena, or hematemesis Neurologic:  No visual changes, wkns, changes in mental  status. All other systems reviewed and are otherwise negative except as noted above.  Physical Exam   Blood pressure (!) 174/117, pulse (!) 55, temperature 99.3 F (37.4 C), temperature source Oral, resp. rate 18, SpO2 100 %.  General: Well developed, well nourished,female in no acute distress. Head: Normocephalic, atraumatic, sclera non-icteric, no xanthomas,  Neck: No carotid bruits. JVD not elevated.  Lungs: Respirations regular and unlabored, without wheezes or rales.  Heart: Irregularly irregular rhythm, tachycardic. No S3 or S4.  2/6 systolic murmur in RUSB Abdomen: Soft, non-tender, non-distended with normoactive bowel sounds. No hepatomegaly. No rebound/guarding. No obvious abdominal masses. Msk:  Strength and  tone appear normal for age. No joint deformities or effusions. Extremities: No clubbing or cyanosis. Trace ankle edema  Distal pedal pulses are 2+ bilaterally. Neuro: Alert and oriented X 3. Moves all extremities spontaneously. No focal deficits noted. Psych:  Responds to questions appropriately with a normal affect. Skin: No rashes or lesions noted  Labs    Troponin (Point of Care Test) No results for input(s): TROPIPOC in the last 72 hours.  Recent Labs  04/26/16 0925  TROPONINI 0.04*   Lab Results  Component Value Date   WBC 8.6 04/26/2016   HGB 10.9 (L) 04/26/2016   HCT 33.4 (L) 04/26/2016   MCV 92.8 04/26/2016   PLT 188 04/26/2016     Recent Labs Lab 04/26/16 0925  NA 142  K 3.4*  CL 105  CO2 19*  BUN 17  CREATININE 1.23*  CALCIUM 9.4  GLUCOSE 103*   No results found for: CHOL, HDL, LDLCALC, TRIG No results found for: DDIMER   B Natriuretic Peptide  Date/Time Value Ref Range Status  04/26/2016 12:53 PM 560.8 (H) 0.0 - 100.0 pg/mL Final   No results found for: PROBNP No results for input(s): INR in the last 72 hours.    Radiology Studies    Dg Chest 2 View  Result Date: 04/26/2016 CLINICAL DATA:  Shortness of breath. EXAM: CHEST  2  VIEW COMPARISON:  06/21/2014 . FINDINGS: Mediastinum hilar structures normal. Cardiomegaly with mild pulmonary vascular prominence and bilateral interstitial prominence consistent mild CHF. No pleural effusion or pneumothorax. IMPRESSION: Congestive heart failure with mild pulmonary interstitial edema. Electronically Signed   By: Marcello Moores  Register   On: 04/26/2016 10:08    EKG & Cardiac Imaging    EKG: Atrial fibrillation 121 bpm.   ECHOCARDIOGRAM:  Assessment & Plan   New onset atrial fibrillation with rapid ventricular response -Awoke at 1 AM with palpitations and shortness of breath. No previous cardiac history.  -Diltiazem drip was initiated. Currently she remains in atrial fibrillation with rates around 100 bpm.  -First troponin mildly elevated, consistent with demand ischemia insetting of rapid A. Fib -EKG without ischemic changes -CXR: Congestive heart failure with mild pulmonary interstitial edema. -CHA2DS2-VASc Score is at least 4 (HTN, age, female, diabetes) She will need anticoagulation for stroke risk reduction. Begin Eliquis 5 mg bid -Will check BNP and TSH -We will check echocardiogram for LV function, valve status (pt has murmur) and wall motion -Will give low dose diuretic, Lasix 20 mg IV daily  CKD stage III -SCr 1.23. Baseline 1.2-1.4 -Monitor BMet daily  Hypertension -Home meds include amlodipine 10 mg, clonidine 0.3 mg twice a day, Azilsartan-chlorthalidone 1 mg daily -Blood pressure is high. Continue IV diltiazem for atrial fibrillation may help blood pressure -Patient reports blood pressures are usually around 130/80 at her office appointments -Continue to monitor blood pressure and may need adjustments in medication -Hold home amlodipine while on diltiazem. Continue clonidine and hold ARB. Consider adding ARB back later.   Hypokalemia -K+ 3.4. KDur 40 mEq now and 20 mEq daily while on lasix.   Hyperlipidemia -Managed by her PCP. No lipid values in Epic. Will  check FLP in am. -Continue Crestor 10 mg  Diabetes -Home meds include Amaryl and Actos -SSI while here, hold oral meds. Check A1c  Snoring -Pt was urged to have sleep study in the past but did not as she felt that she would be unable to tolerate CPAP. Urged to consider as OSA may precipitate afib or make it harder to  control. Advise outpatient sleep study.  SignedDaune Perch, NP-C 04/26/2016, 2:17 PM Pager: [phone]  As above, patient seen and examined. Briefly she is a 76 year old female with past medical history of hypertension, hyperlipidemia, chronic stage III kidney disease, diabetes mellitus, breast cancer with new onset atrial fibrillation. Patient had an episode of heart racing approximately 3 months ago that resolved with rest. She awoke at 1 AM with increased dyspnea and palpitations. There was mild chest tightness. She presented to the emergency room and was found to be in atrial fibrillation. She was placed on Cardizem with improved rate and resolution of chest tightness. Cardiology felt last to evaluate. Laboratories show potassium 3.4, creatinine 1.23, troponin 0.04, BNP 560, electrocardiogram-atrial fibrillation with nonspecific ST changes.  1 paroxysmal atrial fibrillation-patient has newly diagnosed atrial fibrillation. Continue Cardizem for rate control. Check echocardiogram. TSH 1.50. CHADSvasc 5. Begin apixaban 5 BID. Hopefully she will convert to sinus rhythm on her own. If not we will plan to cardiovert tomorrow. Note symptom onset 13 hours ago.  2 Hypertension-given we are adding Cardizem I will discontinue amlodipine. Continue clonidine. Hold ARB/Hct and follow blood pressure. We can resume if needed.  3 acute diastolic congestive heart failure-patient has increasing dyspnea likely secondary to atrial fibrillation. Add Lasix 20 mg IV daily. Follow renal function.  4 hypokalemia-supplement.  5 chronic stage III kidney disease-follow renal function closely  with diuresis.  6 normocytic anemia-likely from renal insufficiency. Follow-up primary care.  7 chest tightness-cycle enzymes. From atrial fibrillation.  Kirk Ruths, MD

## 2016-04-26 NOTE — Progress Notes (Signed)
Patient arrived to 2W room 19 from the ED.  Telemetry monitor was applied and CCMD notified.  Patient oriented to unit and room to include call light and phone.  Will continue to monitor.

## 2016-04-26 NOTE — ED Triage Notes (Signed)
Chest pressure and SOB at 1 AM with diaphoresis. A-fib on the monitor, no history, lungs clear and equal bilateral, EMS applied 4L O2.and gave 324 ASA and 2 nitro.

## 2016-04-27 ENCOUNTER — Telehealth: Payer: Self-pay | Admitting: Cardiology

## 2016-04-27 ENCOUNTER — Observation Stay (HOSPITAL_BASED_OUTPATIENT_CLINIC_OR_DEPARTMENT_OTHER): Payer: Medicare Other

## 2016-04-27 DIAGNOSIS — I4891 Unspecified atrial fibrillation: Secondary | ICD-10-CM | POA: Diagnosis not present

## 2016-04-27 DIAGNOSIS — I10 Essential (primary) hypertension: Secondary | ICD-10-CM | POA: Diagnosis not present

## 2016-04-27 DIAGNOSIS — I480 Paroxysmal atrial fibrillation: Secondary | ICD-10-CM | POA: Diagnosis not present

## 2016-04-27 LAB — ECHOCARDIOGRAM COMPLETE
AVLVOTPG: 5 mmHg
CHL CUP DOP CALC LVOT VTI: 29.1 cm
E/e' ratio: 19.03
FS: 28 % (ref 28–44)
HEIGHTINCHES: 69 in
IVS/LV PW RATIO, ED: 0.69
LA ID, A-P, ES: 39 mm
LA diam end sys: 39 mm
LA vol index: 30.1 mL/m2
LA vol: 64.4 mL
LADIAMINDEX: 1.82 cm/m2
LAVOLA4C: 76.3 mL
LDCA: 2.27 cm2
LV E/e' medial: 19.03
LV E/e'average: 19.03
LV PW d: 13 mm — AB (ref 0.6–1.1)
LV TDI E'MEDIAL: 6.31
LVELAT: 6.2 cm/s
LVOTD: 17 mm
LVOTPV: 115 cm/s
LVOTSV: 66 mL
MV pk A vel: 37 m/s
MV pk E vel: 118 m/s
MVPG: 6 mmHg
PV Reg vel dias: 92.3 cm/s
RV LATERAL S' VELOCITY: 9.79 cm/s
RV TAPSE: 22.6 mm
RV sys press: 41 mmHg
Reg peak vel: 307 cm/s
TDI e' lateral: 6.2
TR max vel: 307 cm/s
WEIGHTICAEL: 3457.6 [oz_av]

## 2016-04-27 LAB — BASIC METABOLIC PANEL
Anion gap: 10 (ref 5–15)
BUN: 11 mg/dL (ref 6–20)
CHLORIDE: 102 mmol/L (ref 101–111)
CO2: 26 mmol/L (ref 22–32)
CREATININE: 1.41 mg/dL — AB (ref 0.44–1.00)
Calcium: 9.1 mg/dL (ref 8.9–10.3)
GFR calc Af Amer: 41 mL/min — ABNORMAL LOW (ref >60)
GFR calc non Af Amer: 36 mL/min — ABNORMAL LOW (ref >60)
Glucose, Bld: 133 mg/dL — ABNORMAL HIGH (ref 65–99)
Potassium: 3.3 mmol/L — ABNORMAL LOW (ref 3.5–5.1)
Sodium: 138 mmol/L (ref 135–145)

## 2016-04-27 LAB — HEMOGLOBIN A1C
HEMOGLOBIN A1C: 5.8 % — AB (ref 4.8–5.6)
MEAN PLASMA GLUCOSE: 120 mg/dL

## 2016-04-27 LAB — LIPID PANEL
Cholesterol: 144 mg/dL (ref 0–200)
HDL: 59 mg/dL (ref >40)
LDL CALC: 58 mg/dL (ref 0–99)
Total CHOL/HDL Ratio: 2.4 RATIO
Triglycerides: 137 mg/dL (ref <150)
VLDL: 27 mg/dL (ref 0–40)

## 2016-04-27 LAB — GLUCOSE, CAPILLARY: Glucose-Capillary: 132 mg/dL — ABNORMAL HIGH (ref 65–99)

## 2016-04-27 MED ORDER — FUROSEMIDE 20 MG PO TABS
20.0000 mg | ORAL_TABLET | Freq: Every day | ORAL | Status: DC
  Administered 2016-04-27: 20 mg via ORAL
  Filled 2016-04-27: qty 1

## 2016-04-27 MED ORDER — DILTIAZEM HCL ER COATED BEADS 240 MG PO CP24
240.0000 mg | ORAL_CAPSULE | Freq: Every day | ORAL | Status: DC
  Administered 2016-04-27: 240 mg via ORAL
  Filled 2016-04-27: qty 1

## 2016-04-27 MED ORDER — POTASSIUM CHLORIDE CRYS ER 20 MEQ PO TBCR: 20.0000 meq | EXTENDED_RELEASE_TABLET | Freq: Every day | ORAL | 3 refills | Status: DC

## 2016-04-27 MED ORDER — APIXABAN 5 MG PO TABS: 5.0000 mg | ORAL_TABLET | Freq: Two times a day (BID) | ORAL | 3 refills | Status: DC

## 2016-04-27 MED ORDER — DILTIAZEM HCL ER COATED BEADS 240 MG PO CP24: 240.0000 mg | ORAL_CAPSULE | Freq: Every day | ORAL | 3 refills | Status: DC

## 2016-04-27 MED ORDER — POTASSIUM CHLORIDE CRYS ER 20 MEQ PO TBCR
40.0000 meq | EXTENDED_RELEASE_TABLET | Freq: Once | ORAL | Status: AC
  Administered 2016-04-27: 40 meq via ORAL
  Filled 2016-04-27: qty 2

## 2016-04-27 MED ORDER — FUROSEMIDE 20 MG PO TABS: 20.0000 mg | ORAL_TABLET | Freq: Every day | ORAL | 3 refills | Status: DC

## 2016-04-27 NOTE — Telephone Encounter (Signed)
New message   TOC appt made on March 30th with Almyra Deforest

## 2016-04-27 NOTE — Telephone Encounter (Signed)
TOC  Patient currently admitted. First TOC outreach to be made upon hospital discharge

## 2016-04-27 NOTE — Discharge Summary (Signed)
Discharge Summary    Patient ID: Carrie Key,  MRN: [id number], DOB/AGE: 1940-05-11 76 y.o.  Admit date: 04/26/2016 Discharge date: 04/27/2016   Primary Care Provider: Maximino Greenland Primary Cardiologist: new - Dr. Stanford Breed  Discharge Diagnoses    Principal Problem:   Atrial fibrillation [facility]) Active Problems:   Hyperlipidemia   HYPERTENSION, BENIGN   Snoring   CKD (chronic kidney disease), stage III   Allergies No Known Allergies   History of Present Illness     Carrie Key is a 76 y.o. female with past medical history of Hypertension, hyperlipidemia, GERD, CKD stage III, type 2 diabetes, breast cancer with simple mastectomy in 2014 who presented with sensation of esophageal reflux and rapid heartbeat.  She awoke at 1 am with palpitations and shortness of breath. She was found to be in afib with RVR on arrival to ED. She had mild chest tightness that resolved with reduction in rate. She continues to have mild shortness of breath. Prior to this episode she has had no recent increase in her baseline mild exertional shortness of breath or chest discomfort. She has had no lightheadedness, near syncope or syncope. She has 2 pillow orthopnea, rare PND and mild pedal edema. She has had one similar episode about 3 months ago that was brief, resolved spontaneously and she did not seek treatment for. She has no history of MI or cardiac events or previously diagnosed arrhythmia. She was seen remotely in the past by a cardiologist at which time she was given potassium and a blood pressure lowering medication. She smoked off-and-on until quitting in 1989 she drinks 2-3 gin drinks on weekends.  She is 1 of 10 children. 8 have passed away and all of them had multiple medical problems including a few that had heart problems but she does not know the details. Her mother had HTN and stroke. Her father had MI and HTN.  EKG shows afib with rapid Ventricular response rate of 121 bpm First  troponin is 0.04, SCr 1.23, K+ 3.4 CXR: Congestive heart failure with mild pulmonary interstitial edema.  Hospital Course     Consultants: None  Diltiazem gtt was started on admission. She converted to NSR overnight on 04/26/16. Diltiazem drip was discontinued and Cartia was started at 240 mg daily. She continues to deny chest pain, shortness of breath, and palpitations. Troponin 0.04 --> 0.07 --> 0.05; likely due to Afib RVR. EKG unremarkable for ischemic changes. BNP 560 this admission. Echocardiogram showed normal function but moderate diastolic dysfunction.   She was discharged on Cardizem CD 240 mg daily and eliquis 5 mg BID. She was also discharged on new lasix and K regimen. She was hypokalemic on 20 mg lasix and was given supplemental Kdur. She was discharged on 20 mEq potassium.  Azilsartan-chlorthalidone and norvasc were discontinued in the presence of new Cardizem regimen. She was instructed to log daily BP. May need to restart ARB as pressure tolerates.   Patient seen and examined by Dr. Stanford Breed today and was stable for discharge. All follow up has been arranged.  _____________  Discharge Vitals Blood pressure (!) 156/78, pulse (!) 54, temperature 98.2 F (36.8 C), temperature source Oral, resp. rate 20, height 5\' 9"  (1.753 m), weight 216 lb 1.6 oz (98 kg), SpO2 96 %.  Filed Weights   04/26/16 1654 04/27/16 0709  Weight: 223 lb 14.4 oz (101.6 kg) 216 lb 1.6 oz (98 kg)    Labs & Radiologic Studies    CBC  Recent Labs  04/26/16 0925  WBC 8.6  HGB 10.9*  HCT 33.4*  MCV 92.8  PLT 384   Basic Metabolic Panel  Recent Labs  04/26/16 0925 04/26/16 1449 04/27/16 0510  NA 142  --  138  K 3.4*  --  3.3*  CL 105  --  102  CO2 19*  --  26  GLUCOSE 103*  --  133*  BUN 17  --  11  CREATININE 1.23*  --  1.41*  CALCIUM 9.4  --  9.1  MG  --  1.5*  --    Liver Function Tests No results for input(s): AST, ALT, ALKPHOS, BILITOT, PROT, ALBUMIN in the last 72 hours. No  results for input(s): LIPASE, AMYLASE in the last 72 hours. Cardiac Enzymes  Recent Labs  04/26/16 0925 04/26/16 1449 04/26/16 2114  TROPONINI 0.04* 0.07* 0.05*   BNP Invalid input(s): POCBNP D-Dimer No results for input(s): DDIMER in the last 72 hours. Hemoglobin A1C  Recent Labs  04/26/16 1449  HGBA1C 5.8*   Fasting Lipid Panel  Recent Labs  04/27/16 0510  CHOL 144  HDL 59  LDLCALC 58  TRIG 137  CHOLHDL 2.4   Thyroid Function Tests  Recent Labs  04/26/16 1253  TSH 1.500   _____________  Dg Chest 2 View  Result Date: 04/26/2016 CLINICAL DATA:  Shortness of breath. EXAM: CHEST  2 VIEW COMPARISON:  06/21/2014 . FINDINGS: Mediastinum hilar structures normal. Cardiomegaly with mild pulmonary vascular prominence and bilateral interstitial prominence consistent mild CHF. No pleural effusion or pneumothorax. IMPRESSION: Congestive heart failure with mild pulmonary interstitial edema. Electronically Signed   By: Marcello Moores  Register   On: 04/26/2016 10:08     Diagnostic Studies/Procedures    Echocardiogram 04/27/16: Study Conclusions - Left ventricle: The cavity size was normal. Wall thickness was   normal. Systolic function was normal. The estimated ejection   fraction was in the range of 55% to 60%. Wall motion was normal;   there were no regional wall motion abnormalities. E/medial e&' >   15 suggests LV end diastolic pressure at least 20 mmHg. - Aortic valve: There was no stenosis. - Mitral valve: There was trivial regurgitation. - Left atrium: The atrium was mildly dilated. - Right ventricle: The cavity size was mildly dilated. Systolic   function was mildly reduced. - Tricuspid valve: Peak RV-RA gradient (S): 38 mm Hg. - Pulmonary arteries: PA peak pressure: 41 mm Hg (S). - Inferior vena cava: The vessel was normal in size. The   respirophasic diameter changes were in the normal range (>= 50%),   consistent with normal central venous  pressure.  Impressions: - Normal LV size with EF 55-60%. Moderate diastolic dysfunction.   Mildly dilated RV with mildly decreased systolic function. No   significant valvular abnormalities. Mild pulmonary hypertension.     Disposition   Pt is being discharged home today in good condition.  Follow-up Plans & Appointments    Follow-up Information    Almyra Deforest, [place] Follow up on 05/06/2016.   Specialties:  Cardiology, Radiology Why:  10:30 am for [facility] and hospital follow up for new Afib Contact information: [address] [place] [address] [phone]          Discharge Instructions    Diet - low sodium heart healthy    Complete by:  As directed    Increase activity slowly    Complete by:  As directed       Discharge Medications  Current Discharge Medication List    START taking these medications   Details  apixaban (ELIQUIS) 5 MG TABS tablet Take 1 tablet (5 mg total) by mouth 2 (two) times daily. Qty: 180 tablet, Refills: 3    diltiazem (CARDIZEM CD) 240 MG 24 hr capsule Take 1 capsule (240 mg total) by mouth daily. Qty: 90 capsule, Refills: 3    furosemide (LASIX) 20 MG tablet Take 1 tablet (20 mg total) by mouth daily. Qty: 30 tablet, Refills: 3    potassium chloride SA (K-DUR,KLOR-CON) 20 MEQ tablet Take 1 tablet (20 mEq total) by mouth daily. Qty: 30 tablet, Refills: 3      CONTINUE these medications which have NOT CHANGED   Details  anastrozole (ARIMIDEX) 1 MG tablet Take 1 tablet (1 mg total) by mouth daily. Qty: 30 tablet, Refills: 11   Associated Diagnoses: Breast cancer of upper-outer quadrant of right female breast (HCC)    cloNIDine (CATAPRES) 0.3 MG tablet Take 0.3 mg by mouth 2 (two) times daily.    docusate sodium (COLACE) 100 MG capsule Take 200 mg by mouth 2 (two) times daily as needed for mild constipation.    fluconazole (DIFLUCAN) 100 MG tablet Take 1 tablet (100 mg total) by mouth daily. Qty: 30 tablet, Refills: 3     gabapentin (NEURONTIN) 300 MG capsule Take 1 capsule (300 mg total) by mouth at bedtime. Qty: 30 capsule, Refills: 6   Associated Diagnoses: Malignant neoplasm of upper-outer quadrant of right female breast, unspecified estrogen receptor status ([place]); History of breast cancer    glimepiride (AMARYL) 1 MG tablet Take 1 mg by mouth daily with breakfast.    ibuprofen (ADVIL,MOTRIN) 200 MG tablet Take 400 mg by mouth every 6 (six) hours as needed for mild pain or moderate pain.    omeprazole (PRILOSEC) 40 MG capsule Take 40 mg by mouth daily.     pioglitazone (ACTOS) 45 MG tablet Take 45 mg by mouth daily.    rosuvastatin (CRESTOR) 10 MG tablet Take 10 mg by mouth daily.    traMADol (ULTRAM) 50 MG tablet Take 1 tablet (50 mg total) by mouth every 6 (six) hours as needed. for pain Qty: 90 tablet, Refills: 0   Associated Diagnoses: Pain in joint, pelvic region and thigh, unspecified laterality      STOP taking these medications     amLODipine (NORVASC) 10 MG tablet      Azilsartan-Chlorthalidone 40-12.5 MG TABS            Outstanding Labs/Studies   Needs BMP for K and creatinine with new lasix/K prescriptions in 1 week.  Needs Hb in 4 weeks.   Duration of Discharge Encounter   Greater than 30 minutes including physician time.  Signed, Tami Lin Duke PA-C 04/27/2016, 4:15 PM

## 2016-04-27 NOTE — Progress Notes (Signed)
  Echocardiogram 2D Echocardiogram has been performed.  Carrie Key 04/27/2016, 2:25 PM

## 2016-04-27 NOTE — Progress Notes (Signed)
Progress Note  Patient Name: Carrie Key Date of Encounter: 04/27/2016  Primary Cardiologist: New - Dr. Stanford Breed  Subjective   Patient is feeling well; denies chest pain, SOB, and palpitations. Pt states she had a brief episode of SOB last night, that spontaneously resolved. She wants to discharge home.  Inpatient Medications    Scheduled Meds: . anastrozole  1 mg Oral Daily  . apixaban  5 mg Oral BID  . cloNIDine  0.3 mg Oral BID  . furosemide  20 mg Intravenous Daily  . gabapentin  300 mg Oral QHS  . insulin aspart  0-9 Units Subcutaneous TID WC  . pantoprazole  40 mg Oral Daily  . potassium chloride  20 mEq Oral Daily  . rosuvastatin  10 mg Oral q1800  . rosuvastatin  10 mg Oral q1800  . sodium chloride flush  3 mL Intravenous Q12H   Continuous Infusions: . diltiazem (CARDIZEM) infusion 10 mg/hr (04/27/16 0349)   PRN Meds: sodium chloride, acetaminophen, docusate sodium, ondansetron (ZOFRAN) IV, sodium chloride flush, traMADol   Vital Signs    Vitals:   04/26/16 1839 04/26/16 2137 04/27/16 0527 04/27/16 0709  BP: (!) 157/85 (!) 165/97 129/60   Pulse: 74 97 (!) 49   Resp:  20 20   Temp:  99.1 F (37.3 C) 98.2 F (36.8 C)   TempSrc:  Oral Oral   SpO2:  97% 94%   Weight:    216 lb 1.6 oz (98 kg)  Height:        Intake/Output Summary (Last 24 hours) at 04/27/16 0751 Last data filed at 04/27/16 0300  Gross per 24 hour  Intake            86.33 ml  Output              875 ml  Net          -788.67 ml   Filed Weights   04/26/16 1654 04/27/16 0709  Weight: 223 lb 14.4 oz (101.6 kg) 216 lb 1.6 oz (98 kg)     Physical Exam   General: Well developed, well nourished, female appearing in no acute distress. Head: Normocephalic, atraumatic.  Neck: Supple without bruits, no JVD Lungs:  Resp regular and unlabored, CTA. Heart: RRR, S1, S2, systolic murmur Abdomen: Soft, non-tender, non-distended with normoactive bowel sounds. No hepatomegaly. No  rebound/guarding. No obvious abdominal masses. Extremities: No clubbing, cyanosis, Trace edema. Distal pedal pulses are faint bilaterally. Neuro: Alert and oriented X 3. Moves all extremities spontaneously. Psych: Normal affect.  Labs    Chemistry Recent Labs Lab 04/26/16 0925 04/27/16 0510  NA 142 138  K 3.4* 3.3*  CL 105 102  CO2 19* 26  GLUCOSE 103* 133*  BUN 17 11  CREATININE 1.23* 1.41*  CALCIUM 9.4 9.1  GFRNONAA 42* 36*  GFRAA 49* 41*  ANIONGAP 18* 10     Hematology Recent Labs Lab 04/26/16 0925  WBC 8.6  RBC 3.60*  HGB 10.9*  HCT 33.4*  MCV 92.8  MCH 30.3  MCHC 32.6  RDW 13.2  PLT 188    Cardiac Enzymes Recent Labs Lab 04/26/16 0925 04/26/16 1449 04/26/16 2114  TROPONINI 0.04* 0.07* 0.05*   No results for input(s): TROPIPOC in the last 168 hours.   BNP Recent Labs Lab 04/26/16 1253  BNP 560.8*     DDimer No results for input(s): DDIMER in the last 168 hours.   Radiology    Dg Chest 2 View  Result Date: 04/26/2016  CLINICAL DATA:  Shortness of breath. EXAM: CHEST  2 VIEW COMPARISON:  06/21/2014 . FINDINGS: Mediastinum hilar structures normal. Cardiomegaly with mild pulmonary vascular prominence and bilateral interstitial prominence consistent mild CHF. No pleural effusion or pneumothorax. IMPRESSION: Congestive heart failure with mild pulmonary interstitial edema. Electronically Signed   By: Marcello Moores  Register   On: 04/26/2016 10:08     Telemetry    Converted to NSR in the 60s - Personally Reviewed  ECG    04/27/16 pending - Personally Reviewed   Cardiac Studies   Echocardiogram 04/27/16: pending  Patient Profile     76 y.o. female with past medical history of Hypertension, hyperlipidemia, GERD, CKD stage III, type 2 diabetes, breast cancer with simple mastectomy in 2014 who presented with sensation of esophageal reflux and rapid heartbeat. She was in Afib RVR. Cardiology was consulted for further management.  Assessment & Plan      1. Atrial fibrillation - cardizem gtt at 10 ml/hr - she converted to NSR overnight in the 60s - plan to transition cardizem to PO: 60 mg q6hr - continue eliquis - This patients CHA2DS2-VASc Score and unadjusted Ischemic Stroke Rate (% per year) is equal to 4.8 % stroke rate/year from a score of 4 - BNP 560 - TSH WNL - troponin 0.04 --> 0.07 --> 0.05  2. Hypokalemia - replaced K to keep near 4; extra [place] given  3. CKD stage 3 -sCr 1.41 (1.23); baseline 1.2-1.4 - pt making adequate urine  4. Hypertension - holding norvasc and ARB - BP 129/60 - 174/101 - may add ARB after cardizem dose titrated  5. DM - continue SSI  Signed, Ledora Bottcher , PA-C 7:51 AM 04/27/2016 Pager: [phone] As above, patient seen and examined. She denies dyspnea, chest pain or palpitations this morning. She converted from atrial fibrillation to sinus rhythm spontaneously. I will discontinue IV Cardizem and treat with Cardizem CD 240 mg daily. Continue apixaban 5 mg BID. Await echocardiogram. If LV function normal she will be discharged this afternoon and follow-up with transition of care appointment in one week. Check hemoglobin in 4 weeks. Supplement potassium. I will change IV Lasix to 20 mg po daily. Check potassium and renal function in 1 week. We have discontinued amlodipine and ARB given addition of Cardizem CD. Continue clonidine. Follow blood pressure at home and adjust regimen as needed. Note she has minimal elevation in troponin with no clear trend. Not consistent with acute coronary syndrome.  > 30 min pa and physician time D2 Kirk Ruths, MD

## 2016-04-27 NOTE — Care Management Note (Signed)
Case Management Note Marvetta Gibbons RN, BSN Unit 2W-Case Manager [phone]  Patient Details  Name: Carrie Key MRN: [id number] Date of Birth: 04-22-1940  Subjective/Objective:  Pt presented with afib                   Action/Plan: PTA pt lived at home- independent- anticipate return home- CM to follow for any d/c needs.   Expected Discharge Date:                  Expected Discharge Plan:  Home/Self Care  In-House Referral:     Discharge planning Services  CM Consult  Post Acute Care Choice:    Choice offered to:     DME Arranged:    DME Agency:     HH Arranged:    HH Agency:     Status of Service:  In process, will continue to follow  If discussed at Long Length of Stay Meetings, dates discussed:    Additional Comments:  Dawayne Patricia, RN 04/27/2016, 10:05 AM

## 2016-04-28 NOTE — Telephone Encounter (Signed)
Patient contacted regarding discharge from [facility] on 04/26/2016 - 04/27/2016.  Patient understands to follow up with provider Almyra Deforest, pa on 05-06-16 at 1030am at [facility] office. Patient understands discharge instructions? YES Patient understands medications and regiment? YES Patient understands to bring all medications to this visit? YES  Pt states no further questions and nothing else is needed at this time. She will bring current medications

## 2016-05-06 ENCOUNTER — Encounter: Payer: Self-pay | Admitting: Physician Assistant

## 2016-05-06 ENCOUNTER — Ambulatory Visit (INDEPENDENT_AMBULATORY_CARE_PROVIDER_SITE_OTHER): Payer: Medicare Other | Admitting: Physician Assistant

## 2016-05-06 VITALS — BP 120/80 | HR 43 | Ht 69.0 in | Wt 224.0 lb

## 2016-05-06 DIAGNOSIS — N183 Chronic kidney disease, stage 3 unspecified: Secondary | ICD-10-CM

## 2016-05-06 DIAGNOSIS — E785 Hyperlipidemia, unspecified: Secondary | ICD-10-CM | POA: Diagnosis not present

## 2016-05-06 DIAGNOSIS — I10 Essential (primary) hypertension: Secondary | ICD-10-CM | POA: Diagnosis not present

## 2016-05-06 DIAGNOSIS — R001 Bradycardia, unspecified: Secondary | ICD-10-CM | POA: Diagnosis not present

## 2016-05-06 DIAGNOSIS — E119 Type 2 diabetes mellitus without complications: Secondary | ICD-10-CM

## 2016-05-06 DIAGNOSIS — I480 Paroxysmal atrial fibrillation: Secondary | ICD-10-CM | POA: Diagnosis not present

## 2016-05-06 LAB — BASIC METABOLIC PANEL WITH GFR
BUN: 16 mg/dL (ref 7–25)
CO2: 24 mmol/L (ref 20–31)
CREATININE: 1.35 mg/dL — AB (ref 0.60–0.93)
Calcium: 9.8 mg/dL (ref 8.6–10.4)
Chloride: 105 mmol/L (ref 98–110)
GFR, EST AFRICAN AMERICAN: 45 mL/min — AB (ref >=60)
GFR, Est Non African American: 39 mL/min — ABNORMAL LOW (ref >=60)
Glucose, Bld: 118 mg/dL — ABNORMAL HIGH (ref 65–99)
Potassium: 4.3 mmol/L (ref 3.5–5.3)
Sodium: 140 mmol/L (ref 135–146)

## 2016-05-06 MED ORDER — DILTIAZEM HCL ER COATED BEADS 120 MG PO CP24: 120.0000 mg | ORAL_CAPSULE | Freq: Every day | ORAL | 3 refills | Status: DC

## 2016-05-06 NOTE — Progress Notes (Signed)
Cardiology Office Note    Date:  05/06/2016   ID:  Carrie Key, DOB April 22, 1940, MRN [id number]  PCP:  Carrie Greenland, MD  Cardiologist:  Dr. Stanford Key  Chief Complaint  Patient presents with  . Transitions Of Care    seen for Dr. Stanford Key, 10 day TOC followup, pt c/o weakness    History of Present Illness:  Carrie Key is a 76 y.o. female with PMH of HTN, HLD, CKD stage III, type II DM, And history of breast cancer with simple mastectomy in 2014 who was recently presented to the hospital in March 2018 with new onset of atrial fibrillation. She woke up in the morning of 04/26/2016 with failure and palpitation or shortness of breath. She was noted to be in atrial fibrillation with RVR upon first arrival in the ED. She also has some mild chest discomfort that resolved with reduction in the heart rate. She was initially placed on diltiazem drip, she is spontaneously converted to normal sinus rhythm overnight. Diltiazem drip was discontinued and her cardiac was started at 240 mg daily. Troponin was borderline elevated at 0.04 --> 0.07 --> 0.05. BNP was 560. Echocardiogram showed normal EF with moderate diastolic dysfunction. He was also started on 5 mg BID dosing of eliquis. Her Azilsartan-chlorthalidone and Norvasc were discontinued.  She returned today for follow-up, she is very fatigued and has been having some shallow breathing. Otherwise she denies any chest discomfort. EKG today does show that her heart rate is 43 which is likely responsible for her to fatigue. She is on clonidine for blood pressure, and that she has also been compliant with diltiazem CD 240 mg daily. Her blood pressure is stable. Unfortunately, she has not picked up her eliquis. We did call [facility] and was told at most they can only fill 60 days at a time instead of the 90 day Rx. We have given her samples and resend prescription. I will see her back in 2-3 weeks to see if her heart rate has improved.    Past  Medical History:  Diagnosis Date  . Atrial fibrillation ([place])   . Breast cancer ([place])   . Cancer ([place])   . CKD (chronic kidney disease), stage III 07/10/2014  . Diabetes mellitus without complication ([place])   . GERD (gastroesophageal reflux disease)   . Heart murmur   . Hyperlipemia   . Hypertension   . Wears glasses     Past Surgical History:  Procedure Laterality Date  . ABDOMINAL HYSTERECTOMY    . BACK SURGERY  2002   lumbar lam  . CARDIAC CATHETERIZATION  2003  . COLONOSCOPY    . SIMPLE MASTECTOMY WITH AXILLARY SENTINEL NODE BIOPSY Right 12/03/2012   Procedure: RIGHT SIMPLE MASTECTOMY WITH RIGHT  AXILLARY SENTINEL LYMPH NODE BIOPSY;  Surgeon: Carrie Medal, MD;  Location: [facility];  Service: General;  Laterality: Right;  . UPPER GI ENDOSCOPY      Current Medications: Outpatient Medications Prior to Visit  Medication Sig Dispense Refill  . anastrozole (ARIMIDEX) 1 MG tablet Take 1 tablet (1 mg total) by mouth daily. 30 tablet 11  . apixaban (ELIQUIS) 5 MG TABS tablet Take 1 tablet (5 mg total) by mouth 2 (two) times daily. 180 tablet 3  . cloNIDine (CATAPRES) 0.3 MG tablet Take 0.3 mg by mouth 2 (two) times daily.    Marland Kitchen docusate sodium (COLACE) 100 MG capsule Take 200 mg by mouth 2 (two) times daily as needed for mild constipation.    Marland Kitchen  fluconazole (DIFLUCAN) 100 MG tablet Take 1 tablet (100 mg total) by mouth daily. 30 tablet 3  . furosemide (LASIX) 20 MG tablet Take 1 tablet (20 mg total) by mouth daily. 30 tablet 3  . gabapentin (NEURONTIN) 300 MG capsule Take 1 capsule (300 mg total) by mouth at bedtime. 30 capsule 6  . glimepiride (AMARYL) 1 MG tablet Take 1 mg by mouth daily with breakfast.    . ibuprofen (ADVIL,MOTRIN) 200 MG tablet Take 400 mg by mouth every 6 (six) hours as needed for mild pain or moderate pain.    Marland Kitchen omeprazole (PRILOSEC) 40 MG capsule Take 40 mg by mouth daily.     . pioglitazone (ACTOS) 45 MG tablet Take 45 mg by mouth daily.    .  potassium chloride SA (K-DUR,KLOR-CON) 20 MEQ tablet Take 1 tablet (20 mEq total) by mouth daily. 30 tablet 3  . rosuvastatin (CRESTOR) 10 MG tablet Take 10 mg by mouth daily.    . traMADol (ULTRAM) 50 MG tablet Take 1 tablet (50 mg total) by mouth every 6 (six) hours as needed. for pain 90 tablet 0  . diltiazem (CARDIZEM CD) 240 MG 24 hr capsule Take 1 capsule (240 mg total) by mouth daily. 90 capsule 3   No facility-administered medications prior to visit.      Allergies:   Patient has no known allergies.   Social History   Social History  . Marital status: Widowed    Spouse name: N/A  . Number of children: 5  . Years of education: 40   Occupational History  . Retired     disability prior to retirement   Social History Main Topics  . Smoking status: Former Smoker    Packs/day: 0.30    Years: 30.00    Types: Cigarettes    Quit date: 11/26/1988  . Smokeless tobacco: Never Used  . Alcohol use 2.4 oz/week    4 Glasses of wine per week     Comment: occ  . Drug use: No  . Sexual activity: Yes    Birth control/ protection: Post-menopausal   Other Topics Concern  . None   Social History Narrative   Patient is single and lives alone.   Patient is retired.   Patient has five adult children.   Patient has a 11 grade education   Patient is right-handed.   Patient drinks one cup of coffee and two cups of tea daily.     Family History:  The patient's family history includes Heart attack in her father; Hypertension in her father, mother, and paternal grandmother; Stroke in her mother.   ROS:   Please see the history of present illness.    ROS All other systems reviewed and are negative.   PHYSICAL EXAM:   VS:  BP 120/80   Pulse (!) 43   Ht 5\' 9"  (1.753 m)   Wt 224 lb (101.6 kg)   BMI 33.08 kg/m    GEN: Well nourished, well developed, in no acute distress  HEENT: normal  Neck: no JVD, carotid bruits, or masses Cardiac: RRR; no murmurs, rubs, or gallops,no edema    Respiratory:  clear to auscultation bilaterally, normal work of breathing GI: soft, nontender, nondistended, + BS MS: no deformity or atrophy  Skin: warm and dry, no rash Neuro:  Alert and Oriented x 3, Strength and sensation are intact Psych: euthymic mood, full affect  Wt Readings from Last 3 Encounters:  05/06/16 224 lb (101.6 kg)  04/27/16 216  lb 1.6 oz (98 kg)  02/18/16 226 lb 9.6 oz (102.8 kg)      Studies/Labs Reviewed:   EKG:  EKG is ordered today.  The ekg ordered today demonstrates sinus bradycardia with heart rate 43.  Recent Labs: 04/26/2016: B Natriuretic Peptide 560.8; Hemoglobin 10.9; Magnesium 1.5; Platelets 188; TSH 1.500 05/06/2016: BUN 16; Creat 1.35; Potassium 4.3; Sodium 140   Lipid Panel    Component Value Date/Time   CHOL 144 04/27/2016 0510   TRIG 137 04/27/2016 0510   HDL 59 04/27/2016 0510   CHOLHDL 2.4 04/27/2016 0510   VLDL 27 04/27/2016 0510   LDLCALC 58 04/27/2016 0510    Additional studies/ records that were reviewed today include:   Echo 04/27/2016 LV EF: 55% -   60%   - Left ventricle: The cavity size was normal. Wall thickness was   normal. Systolic function was normal. The estimated ejection   fraction was in the range of 55% to 60%. Wall motion was normal;   there were no regional wall motion abnormalities. E/medial e&' >   15 suggests LV end diastolic pressure at least 20 mmHg. - Aortic valve: There was no stenosis. - Mitral valve: There was trivial regurgitation. - Left atrium: The atrium was mildly dilated. - Right ventricle: The cavity size was mildly dilated. Systolic   function was mildly reduced. - Tricuspid valve: Peak RV-RA gradient (S): 38 mm Hg. - Pulmonary arteries: PA peak pressure: 41 mm Hg (S). - Inferior vena cava: The vessel was normal in size. The   respirophasic diameter changes were in the normal range (>= 50%),   consistent with normal central venous pressure.  Impressions:  - Normal LV size with EF  55-60%. Moderate diastolic dysfunction.   Mildly dilated RV with mildly decreased systolic function. No   significant valvular abnormalities. Mild pulmonary hypertension.    ASSESSMENT:    1. Bradycardia by electrocardiogram   2. CKD (chronic kidney disease), stage III   3. PAF (paroxysmal atrial fibrillation) ([place])   4. Essential hypertension   5. Hyperlipidemia, unspecified hyperlipidemia type   6. Controlled type 2 diabetes mellitus without complication, without long-term current use of insulin (HCC)      PLAN:  In order of problems listed above:  1. Bradycardia: He is maintaining sinus rhythm however with heart rate of 43. He has been having significant fatigue since she left the hospital, this is likely related to bradycardia. I have decreased the Cardizem CD to 120 mg daily  2. PAF on eliquis: Self converted on rate control therapy. Currently on Cardizem 240 mg daily, see #1, I plan to decrease Cardizem to 120 mg daily. Unfortunately, she has not been taking her eliquis either. We have called her pharmacy and arrange for her to pick up eliquis. She was also given information to enroll in patient's assistance program with eliquis. He asked her to contact us if she has any problems obtaining the medication. We did give her some samples today.  3. Hypertension: Blood pressure stable 120/80 today.  4. Hyperlipidemia: On Crestor 10 mg daily, recent lipid panel on 04/27/2016 showed well-controlled cholesterol.  5. DM II: Managed by primary care provider  6. CKD stage III: Obtain basic metabolic panel today.     Medication Adjustments/Labs and Tests Ordered: Current medicines are reviewed at length with the patient today.  Concerns regarding medicines are outlined above.  Medication changes, Labs and Tests ordered today are listed in the Patient Instructions below. Patient Instructions  Medication  Instructions: Decrease Diltiazem CD to 120 mg daily.  SAMPLES of Eliquis given  today.  Labwork:  Your physician recommends that you return for lab work today: BMET   Follow-Up: Your physician recommends that you schedule a follow-up appointment in: 2-3 weeks with Almyra Deforest, PA-C and in 3 months with Dr. Stanford Key.  If you need a refill on your cardiac medications before your next appointment, please call your pharmacy.     Hilbert Corrigan, [place]  05/06/2016 11:30 PM    [place], [place], [place]  [address] Phone: [phone]; Fax: [phone]

## 2016-05-06 NOTE — Patient Instructions (Addendum)
Medication Instructions: Decrease Diltiazem CD to 120 mg daily.  SAMPLES of Eliquis given today.  Labwork:  Your physician recommends that you return for lab work today: BMET   Follow-Up: Your physician recommends that you schedule a follow-up appointment in: 2-3 weeks with Almyra Deforest, PA-C and in 3 months with Dr. Stanford Breed.  If you need a refill on your cardiac medications before your next appointment, please call your pharmacy.

## 2016-05-10 NOTE — Addendum Note (Signed)
Addended by: Milderd Meager on: 05/10/2016 10:59 AM   Modules accepted: Orders

## 2016-05-27 ENCOUNTER — Ambulatory Visit (INDEPENDENT_AMBULATORY_CARE_PROVIDER_SITE_OTHER): Payer: Medicare Other | Admitting: Physician Assistant

## 2016-05-27 ENCOUNTER — Encounter: Payer: Self-pay | Admitting: Physician Assistant

## 2016-05-27 VITALS — BP 180/108 | HR 57

## 2016-05-27 DIAGNOSIS — E119 Type 2 diabetes mellitus without complications: Secondary | ICD-10-CM | POA: Diagnosis not present

## 2016-05-27 DIAGNOSIS — N183 Chronic kidney disease, stage 3 unspecified: Secondary | ICD-10-CM

## 2016-05-27 DIAGNOSIS — I10 Essential (primary) hypertension: Secondary | ICD-10-CM | POA: Diagnosis not present

## 2016-05-27 DIAGNOSIS — I480 Paroxysmal atrial fibrillation: Secondary | ICD-10-CM

## 2016-05-27 DIAGNOSIS — E785 Hyperlipidemia, unspecified: Secondary | ICD-10-CM

## 2016-05-27 DIAGNOSIS — R001 Bradycardia, unspecified: Secondary | ICD-10-CM | POA: Diagnosis not present

## 2016-05-27 MED ORDER — CLONIDINE HCL 0.3 MG PO TABS: 0.3000 mg | ORAL_TABLET | Freq: Two times a day (BID) | ORAL | 0 refills | Status: DC

## 2016-05-27 MED ORDER — CARVEDILOL 3.125 MG PO TABS: 3.1250 mg | ORAL_TABLET | Freq: Two times a day (BID) | ORAL | 3 refills | Status: DC

## 2016-05-27 MED ORDER — AMLODIPINE BESYLATE 5 MG PO TABS: 5.0000 mg | ORAL_TABLET | Freq: Every day | ORAL | 3 refills | Status: DC

## 2016-05-27 NOTE — Progress Notes (Signed)
Cardiology Office Note    Date:  05/28/2016   ID:  Carrie Key, DOB 1940-12-11, MRN [id number]  PCP:  Carrie Greenland, MD  Cardiologist:  Dr. Stanford Breed   Chief Complaint  Patient presents with  . Follow-up    History of Present Illness:  Carrie Key is a 76 y.o. female with PMH of HTN, HLD, CKD stage III, type II DM, And history of breast cancer with simple mastectomy in 2014 who was recently presented to the hospital in March 2018 with new onset of atrial fibrillation. She woke up in the morning of 04/26/2016 with failure and palpitation or shortness of breath. She was noted to be in atrial fibrillation with RVR upon first arrival in the ED. She also has some mild chest discomfort that resolved with reduction in the heart rate. She was initially placed on diltiazem drip, she is spontaneously converted to normal sinus rhythm overnight. Diltiazem drip was discontinued and her cardiac was started at 240 mg daily. Troponin was borderline elevated at 0.04 --> 0.07 --> 0.05. BNP was 560. Echocardiogram showed normal EF with moderate diastolic dysfunction. He was also started on 5 mg BID dosing of eliquis. Her Azilsartan-chlorthalidone and Norvasc were discontinued.  I last saw the patient on 05/06/2016, she was very fatigued at the time. EKG showed heart rate was 43. She was on clonidine for blood pressure, she has also been taking her diltiazem CD 240 mg daily. She did not start on eliquis at the time. We did call Carrie Key and was told at most they can feel 60 mg at the time he still 90 day supply. We have given her samples and prescription.  Carrie Key present for follow-up today, her heart rate is in the high 50s. Apparently she never picked up to 120 mg of diltiazem CD. This was verified with her pharmacy. She is also running out of clonidine 0.3 mg twice a day. I did give her a 90 day supply prescription, during the meantime she will need to discuss with her primary care physician to  request more. Given the fact that her current heart rate is 57 after completely holding the 240 mg diltiazem CD for 3 weeks, I am hesitant to continue to use diltiazem. As her blood pressure is high. She says her amlodipine and Azilsartan-chlorthalidone were discontinued in the hospital. I will restart amlodipine at 5 mg daily. I will also add carvedilol 3.125 mg twice a day for its minimal rate control effect. This dose is unlikely to be increased due to her current bradycardia. Otherwise she is feeling much better than last time, she denies any significant dizziness or fatigue.   Past Medical History:  Diagnosis Date  . Atrial fibrillation ([place])   . Breast cancer ([place])   . Cancer ([place])   . CKD (chronic kidney disease), stage III 07/10/2014  . Diabetes mellitus without complication ([place])   . GERD (gastroesophageal reflux disease)   . Heart murmur   . Hyperlipemia   . Hypertension   . Wears glasses     Past Surgical History:  Procedure Laterality Date  . ABDOMINAL HYSTERECTOMY    . BACK SURGERY  2002   lumbar lam  . CARDIAC CATHETERIZATION  2003  . COLONOSCOPY    . SIMPLE MASTECTOMY WITH AXILLARY SENTINEL NODE BIOPSY Right 12/03/2012   Procedure: RIGHT SIMPLE MASTECTOMY WITH RIGHT  AXILLARY SENTINEL LYMPH NODE BIOPSY;  Surgeon: Carrie Medal, MD;  Location: [facility];  Service: General;  Laterality: Right;  . UPPER GI ENDOSCOPY      Current Medications: Outpatient Medications Prior to Visit  Medication Sig Dispense Refill  . anastrozole (ARIMIDEX) 1 MG tablet Take 1 tablet (1 mg total) by mouth daily. 30 tablet 11  . apixaban (ELIQUIS) 5 MG TABS tablet Take 1 tablet (5 mg total) by mouth 2 (two) times daily. 180 tablet 3  . docusate sodium (COLACE) 100 MG capsule Take 200 mg by mouth 2 (two) times daily as needed for mild constipation.    . fluconazole (DIFLUCAN) 100 MG tablet Take 1 tablet (100 mg total) by mouth daily. 30 tablet 3  . furosemide (LASIX) 20 MG  tablet Take 1 tablet (20 mg total) by mouth daily. 30 tablet 3  . gabapentin (NEURONTIN) 300 MG capsule Take 1 capsule (300 mg total) by mouth at bedtime. 30 capsule 6  . glimepiride (AMARYL) 1 MG tablet Take 1 mg by mouth daily with breakfast.    . ibuprofen (ADVIL,MOTRIN) 200 MG tablet Take 400 mg by mouth every 6 (six) hours as needed for mild pain or moderate pain.    Marland Kitchen omeprazole (PRILOSEC) 40 MG capsule Take 40 mg by mouth daily.     . pioglitazone (ACTOS) 45 MG tablet Take 45 mg by mouth daily.    . potassium chloride SA (K-DUR,KLOR-CON) 20 MEQ tablet Take 1 tablet (20 mEq total) by mouth daily. 30 tablet 3  . rosuvastatin (CRESTOR) 10 MG tablet Take 10 mg by mouth daily.    . traMADol (ULTRAM) 50 MG tablet Take 1 tablet (50 mg total) by mouth every 6 (six) hours as needed. for pain 90 tablet 0  . cloNIDine (CATAPRES) 0.3 MG tablet Take 0.3 mg by mouth 2 (two) times daily.    Marland Kitchen diltiazem (CARDIZEM CD) 120 MG 24 hr capsule Take 1 capsule (120 mg total) by mouth daily. (Patient not taking: Reported on 05/27/2016) 30 capsule 3   No facility-administered medications prior to visit.      Allergies:   Patient has no known allergies.   Social History   Social History  . Marital status: Widowed    Spouse name: N/A  . Number of children: 5  . Years of education: 1   Occupational History  . Retired     disability prior to retirement   Social History Main Topics  . Smoking status: Former Smoker    Packs/day: 0.30    Years: 30.00    Types: Cigarettes    Quit date: 11/26/1988  . Smokeless tobacco: Never Used  . Alcohol use 2.4 oz/week    4 Glasses of wine per week     Comment: occ  . Drug use: No  . Sexual activity: Yes    Birth control/ protection: Post-menopausal   Other Topics Concern  . None   Social History Narrative   Patient is single and lives alone.   Patient is retired.   Patient has five adult children.   Patient has a 11 grade education   Patient is  right-handed.   Patient drinks one cup of coffee and two cups of tea daily.     Family History:  The patient's family history includes Heart attack in her father; Hypertension in her father, mother, and paternal grandmother; Stroke in her mother.   ROS:   Please see the history of present illness.    ROS All other systems reviewed and are negative.   PHYSICAL EXAM:   VS:  BP (!) 180/108  Pulse (!) 57    GEN: Well nourished, well developed, in no acute distress  HEENT: normal  Neck: no JVD, carotid bruits, or masses Cardiac: RRR; no murmurs, rubs, or gallops,no edema  Respiratory:  clear to auscultation bilaterally, normal work of breathing GI: soft, nontender, nondistended, + BS MS: no deformity or atrophy  Skin: warm and dry, no rash Neuro:  Alert and Oriented x 3, Strength and sensation are intact Psych: euthymic mood, full affect  Wt Readings from Last 3 Encounters:  05/06/16 224 lb (101.6 kg)  04/27/16 216 lb 1.6 oz (98 kg)  02/18/16 226 lb 9.6 oz (102.8 kg)      Studies/Labs Reviewed:   EKG:  EKG is ordered today.  The ekg ordered today demonstrates sinus brady with heart rate of 57, single PAC.  Recent Labs: 04/26/2016: B Natriuretic Peptide 560.8; Hemoglobin 10.9; Magnesium 1.5; Platelets 188; TSH 1.500 05/06/2016: BUN 16; Creat 1.35; Potassium 4.3; Sodium 140   Lipid Panel    Component Value Date/Time   CHOL 144 04/27/2016 0510   TRIG 137 04/27/2016 0510   HDL 59 04/27/2016 0510   CHOLHDL 2.4 04/27/2016 0510   VLDL 27 04/27/2016 0510   LDLCALC 58 04/27/2016 0510    Additional studies/ records that were reviewed today include:   Echo 04/27/2016 LV EF: 55% - 60%   - Left ventricle: The cavity size was normal. Wall thickness was normal. Systolic function was normal. The estimated ejection fraction was in the range of 55% to 60%. Wall motion was normal; there were no regional wall motion abnormalities. E/medial e&' > 15 suggests LV end  diastolic pressure at least 20 mmHg. - Aortic valve: There was no stenosis. - Mitral valve: There was trivial regurgitation. - Left atrium: The atrium was mildly dilated. - Right ventricle: The cavity size was mildly dilated. Systolic function was mildly reduced. - Tricuspid valve: Peak RV-RA gradient (S): 38 mm Hg. - Pulmonary arteries: PA peak pressure: 41 mm Hg (S). - Inferior vena cava: The vessel was normal in size. The respirophasic diameter changes were in the normal range (>= 50%), consistent with normal central venous pressure.  Impressions:  - Normal LV size with EF 55-60%. Moderate diastolic dysfunction. Mildly dilated RV with mildly decreased systolic function. No significant valvular abnormalities. Mild pulmonary hypertension.     ASSESSMENT:    1. Bradycardia   2. Paroxysmal atrial fibrillation (HCC)   3. CKD (chronic kidney disease), stage III   4. Essential hypertension   5. Hyperlipidemia, unspecified hyperlipidemia type   6. Controlled type 2 diabetes mellitus without complication, without long-term current use of insulin (HCC)      PLAN:  In order of problems listed above:  1. Bradycardia: Her heart rate was 43 during the last office visit, I reduced her diltiazem 220 mg daily, however it appears she never picked it up and she was essentially off of diltiazem for the past 3 weeks. We called her pharmacy to verify this information. At this time, I am hesitant to restart her on diltiazem as her heart rate continued to be in the high 50s after holding the 240 mg diltiazem CD for 3 weeks. I will however add a low-dose carvedilol 3.125 mg twice a day which should not decrease her heart rate too much. She is currently on eliquis. I did ask her to verify the medications she is taking or picking up from the pharmacy to make sure she only takes the medication as listed on her medication  list.  2. PAF on eliquis: Self converted on rate control therapy. See  #1. She is essentially off of 240 mg Cardizem since our last visit due to bradycardia. She unfortunately did not start her Cardizem CD 120 mg daily as prescribed. We will discontinue Cardizem for now given persistent heart rate in the high 50s. I will add 3.125 mg twice a day of carvedilol.  3. Hypertension: Blood pressure elevated today. I have decided to restart her previous amlodipine.  4. Hyperlipidemia: On Crestor 10 mg daily. Recent lipid panel showed well-controlled cholesterol  5. DM II: Managed by primary care provider  6. CKD stage III: her Azilsartan-chlorthalidone was discontinued during the last hospitalization, she was discharged on Lasix. Basic metabolic panel obtained during last office visit showed stable and in function and electrolyte.    Medication Adjustments/Labs and Tests Ordered: Current medicines are reviewed at length with the patient today.  Concerns regarding medicines are outlined above.  Medication changes, Labs and Tests ordered today are listed in the Patient Instructions below. Patient Instructions  Medication Instructions:  STOP DILTIAZEM CARVEDILOL 3.125MG  TWICE DAILY AMLODIPINE  5MG  DAILY PRINTED CLONIDINE RX MUST GET REFILLS WITH PCP  If you need a refill on your cardiac medications before your next appointment, please call your pharmacy.  Follow-Up: Your physician wants you to follow-up in: 2-3 [place] APP'S  Thank you for choosing [facility] at [facility] [place], [place]  05/28/2016 10:35 AM    [address], [place], [place]  [address] Phone: [phone]; Fax: [phone]

## 2016-05-27 NOTE — Patient Instructions (Signed)
Medication Instructions:  STOP DILTIAZEM CARVEDILOL 3.125MG  TWICE DAILY AMLODIPINE  5MG  DAILY PRINTED CLONIDINE RX MUST GET REFILLS WITH PCP  If you need a refill on your cardiac medications before your next appointment, please call your pharmacy.  Follow-Up: Your physician wants you to follow-up in: 2-3 [place] APP'S  Thank you for choosing [facility] at [facility]!!

## 2016-05-28 ENCOUNTER — Encounter: Payer: Self-pay | Admitting: Physician Assistant

## 2016-06-10 ENCOUNTER — Emergency Department (HOSPITAL_COMMUNITY)
Admission: EM | Admit: 2016-06-10 | Discharge: 2016-06-10 | Disposition: A | Discharge status: Home / Self Care | Payer: Medicare Other | Attending: Emergency Medicine | Admitting: Emergency Medicine

## 2016-06-10 ENCOUNTER — Emergency Department (HOSPITAL_COMMUNITY): Payer: Medicare Other

## 2016-06-10 ENCOUNTER — Encounter (HOSPITAL_COMMUNITY): Payer: Self-pay | Admitting: Emergency Medicine

## 2016-06-10 DIAGNOSIS — Z79899 Other long term (current) drug therapy: Secondary | ICD-10-CM | POA: Insufficient documentation

## 2016-06-10 DIAGNOSIS — R531 Weakness: Secondary | ICD-10-CM | POA: Diagnosis not present

## 2016-06-10 DIAGNOSIS — R42 Dizziness and giddiness: Secondary | ICD-10-CM | POA: Insufficient documentation

## 2016-06-10 DIAGNOSIS — R110 Nausea: Secondary | ICD-10-CM | POA: Diagnosis not present

## 2016-06-10 DIAGNOSIS — Z87891 Personal history of nicotine dependence: Secondary | ICD-10-CM | POA: Diagnosis not present

## 2016-06-10 DIAGNOSIS — Z853 Personal history of malignant neoplasm of breast: Secondary | ICD-10-CM | POA: Insufficient documentation

## 2016-06-10 DIAGNOSIS — I129 Hypertensive chronic kidney disease with stage 1 through stage 4 chronic kidney disease, or unspecified chronic kidney disease: Secondary | ICD-10-CM | POA: Diagnosis not present

## 2016-06-10 DIAGNOSIS — R55 Syncope and collapse: Secondary | ICD-10-CM | POA: Diagnosis not present

## 2016-06-10 DIAGNOSIS — E1122 Type 2 diabetes mellitus with diabetic chronic kidney disease: Secondary | ICD-10-CM | POA: Insufficient documentation

## 2016-06-10 DIAGNOSIS — N183 Chronic kidney disease, stage 3 (moderate): Secondary | ICD-10-CM | POA: Diagnosis not present

## 2016-06-10 LAB — COMPREHENSIVE METABOLIC PANEL
ALT: 11 U/L — ABNORMAL LOW (ref 14–54)
AST: 16 U/L (ref 15–41)
Albumin: 3.8 g/dL (ref 3.5–5.0)
Alkaline Phosphatase: 57 U/L (ref 38–126)
Anion gap: 9 (ref 5–15)
BUN: 13 mg/dL (ref 6–20)
CO2: 26 mmol/L (ref 22–32)
Calcium: 9.4 mg/dL (ref 8.9–10.3)
Chloride: 103 mmol/L (ref 101–111)
Creatinine, Ser: 1.24 mg/dL — ABNORMAL HIGH (ref 0.44–1.00)
GFR calc Af Amer: 48 mL/min — ABNORMAL LOW (ref >60)
GFR calc non Af Amer: 42 mL/min — ABNORMAL LOW (ref >60)
Glucose, Bld: 108 mg/dL — ABNORMAL HIGH (ref 65–99)
Potassium: 3.4 mmol/L — ABNORMAL LOW (ref 3.5–5.1)
Sodium: 138 mmol/L (ref 135–145)
Total Bilirubin: 0.6 mg/dL (ref 0.3–1.2)
Total Protein: 7.1 g/dL (ref 6.5–8.1)

## 2016-06-10 LAB — CBC WITH DIFFERENTIAL/PLATELET
Basophils Absolute: 0 10*3/uL (ref 0.0–0.1)
Basophils Relative: 0 %
Eosinophils Absolute: 0.1 10*3/uL (ref 0.0–0.7)
Eosinophils Relative: 2 %
HCT: 36.8 % (ref 36.0–46.0)
Hemoglobin: 12.2 g/dL (ref 12.0–15.0)
Lymphocytes Relative: 20 %
Lymphs Abs: 1.4 10*3/uL (ref 0.7–4.0)
MCH: 29.8 pg (ref 26.0–34.0)
MCHC: 33.2 g/dL (ref 30.0–36.0)
MCV: 90 fL (ref 78.0–100.0)
Monocytes Absolute: 0.4 10*3/uL (ref 0.1–1.0)
Monocytes Relative: 6 %
Neutro Abs: 5.4 10*3/uL (ref 1.7–7.7)
Neutrophils Relative %: 72 %
Platelets: 242 10*3/uL (ref 150–400)
RBC: 4.09 MIL/uL (ref 3.87–5.11)
RDW: 12.8 % (ref 11.5–15.5)
WBC: 7.3 10*3/uL (ref 4.0–10.5)

## 2016-06-10 LAB — I-STAT TROPONIN, ED: Troponin i, poc: 0.02 ng/mL (ref 0.00–0.08)

## 2016-06-10 MED ORDER — MECLIZINE HCL 12.5 MG PO TABS: 12.5000 mg | ORAL_TABLET | Freq: Three times a day (TID) | ORAL | 0 refills | Status: DC | PRN

## 2016-06-10 MED ORDER — MECLIZINE HCL 25 MG PO TABS
12.5000 mg | ORAL_TABLET | Freq: Once | ORAL | Status: AC
  Administered 2016-06-10: 12.5 mg via ORAL
  Filled 2016-06-10: qty 1

## 2016-06-10 MED ORDER — ONDANSETRON HCL 4 MG/2ML IJ SOLN
4.0000 mg | Freq: Once | INTRAMUSCULAR | Status: AC
  Administered 2016-06-10: 4 mg via INTRAVENOUS
  Filled 2016-06-10: qty 2

## 2016-06-10 MED ORDER — SODIUM CHLORIDE 0.9 % IV BOLUS (SEPSIS)
500.0000 mL | Freq: Once | INTRAVENOUS | Status: AC
  Administered 2016-06-10: 500 mL via INTRAVENOUS

## 2016-06-10 NOTE — Discharge Instructions (Signed)
Take meclizine 3 times daily as needed for dizziness. Make sure to drink plenty of water. Please follow up with your primary care provider for further evaluation and treatment of your symptoms. Please return to the emergency department if you develop any new or worsening symptoms.

## 2016-06-10 NOTE — ED Notes (Signed)
Pt verbalized understanding discharge instructions and denies any further needs or questions at this time. VS stable, ambulatory and steady gait.   

## 2016-06-10 NOTE — ED Provider Notes (Signed)
[facility] DEPT Provider Note   CSN: [id number] Arrival date & time: 06/10/16  1408     History   Chief Complaint Chief Complaint  Patient presents with  . Dizziness  . Nausea    HPI Carrie Key is a 76 y.o. female with history of atrial fibrillation, stage III chronic kidney disease, diabetes, hypertension who presents with a 2 day history of lightheadedness and nausea. Patient states she has had lightheadedness on most constantly. It is worse with lying down, however it is still present with sitting. Patient does not think that it is worse with standing. Patient denies any headaches or vision changes. She denies any numbness or tingling. She denies any chest pain, shortness of breath, vomiting, urinary symptoms. Patient states she has had left lower quadrant abdominal pain for years, but no change today.  HPI  Past Medical History:  Diagnosis Date  . Atrial fibrillation ([place])   . Breast cancer ([place])   . Cancer ([place])   . CKD (chronic kidney disease), stage III 07/10/2014  . Diabetes mellitus without complication ([place])   . GERD (gastroesophageal reflux disease)   . Heart murmur   . Hyperlipemia   . Hypertension   . Wears glasses     Patient Active Problem List   Diagnosis Date Noted  . Atrial fibrillation ([place]) 04/26/2016  . CKD (chronic kidney disease), stage III 07/10/2014  . Insomnia w/ sleep apnea 09/12/2013  . Snoring 09/12/2013  . Severe obesity (BMI >= 40) ([place]) 09/12/2013  . Neuropathic pain 01/07/2013  . Breast cancer of upper-outer quadrant of right female breast ([place]) 11/05/2012  . Hyperlipidemia 04/24/2009  . HYPERTENSION, BENIGN 04/24/2009    Past Surgical History:  Procedure Laterality Date  . ABDOMINAL HYSTERECTOMY    . BACK SURGERY  2002   lumbar lam  . CARDIAC CATHETERIZATION  2003  . COLONOSCOPY    . SIMPLE MASTECTOMY WITH AXILLARY SENTINEL NODE BIOPSY Right 12/03/2012   Procedure: RIGHT SIMPLE MASTECTOMY WITH RIGHT  AXILLARY SENTINEL LYMPH  NODE BIOPSY;  Surgeon: Shann Medal, MD;  Location: [facility];  Service: General;  Laterality: Right;  . UPPER GI ENDOSCOPY      OB History    No data available       Home Medications    Prior to Admission medications   Medication Sig Start Date End Date Taking? Authorizing Provider  amLODipine (NORVASC) 5 MG tablet Take 1 tablet (5 mg total) by mouth daily. 05/27/16 08/25/16 Yes Almyra Deforest, PA  anastrozole (ARIMIDEX) 1 MG tablet Take 1 tablet (1 mg total) by mouth daily. 08/17/15  Yes Sylvan Cheese, NP  apixaban (ELIQUIS) 5 MG TABS tablet Take 1 tablet (5 mg total) by mouth 2 (two) times daily. 04/27/16  Yes Tami Lin Duke, PA  carvedilol (COREG) 3.125 MG tablet Take 1 tablet (3.125 mg total) by mouth 2 (two) times daily. 05/27/16 08/25/16 Yes Almyra Deforest, PA  cloNIDine (CATAPRES) 0.3 MG tablet Take 1 tablet (0.3 mg total) by mouth 2 (two) times daily. MUST GET REFILLS WITH PCP 05/27/16  Yes Almyra Deforest, PA  docusate sodium (COLACE) 100 MG capsule Take 200 mg by mouth 2 (two) times daily as needed for mild constipation.   Yes Historical Provider, MD  furosemide (LASIX) 20 MG tablet Take 1 tablet (20 mg total) by mouth daily. 04/28/16  Yes Tami Lin Duke, PA  gabapentin (NEURONTIN) 300 MG capsule Take 1 capsule (300 mg total) by mouth at bedtime. 04/08/16  Yes Sarajane Jews  C Magrinat, MD  glimepiride (AMARYL) 1 MG tablet Take 1 mg by mouth daily with breakfast.   Yes Historical Provider, MD  ibuprofen (ADVIL,MOTRIN) 200 MG tablet Take 400 mg by mouth every 6 (six) hours as needed for mild pain or moderate pain.   Yes Historical Provider, MD  omeprazole (PRILOSEC) 40 MG capsule Take 40 mg by mouth daily.    Yes Ronald Lobo, MD  pioglitazone (ACTOS) 45 MG tablet Take 45 mg by mouth daily.   Yes Historical Provider, MD  potassium chloride SA (K-DUR,KLOR-CON) 20 MEQ tablet Take 1 tablet (20 mEq total) by mouth daily. 04/27/16  Yes Tami Lin Duke, PA  rosuvastatin (CRESTOR) 10  MG tablet Take 10 mg by mouth daily.   Yes Willey Blade, MD  traMADol (ULTRAM) 50 MG tablet Take 1 tablet (50 mg total) by mouth every 6 (six) hours as needed. for pain 02/18/16  Yes Holley Bouche, NP  fluconazole (DIFLUCAN) 100 MG tablet Take 1 tablet (100 mg total) by mouth daily. Patient not taking: Reported on 06/10/2016 10/29/13   Chauncey Cruel, MD  meclizine (ANTIVERT) 12.5 MG tablet Take 1 tablet (12.5 mg total) by mouth 3 (three) times daily as needed for dizziness. 06/10/16   Frederica Kuster, PA-C    Family History Family History  Problem Relation Age of Onset  . Hypertension Mother   . Stroke Mother   . Hypertension Father   . Heart attack Father   . Hypertension Paternal Grandmother     Social History Social History  Substance Use Topics  . Smoking status: Former Smoker    Packs/day: 0.30    Years: 30.00    Types: Cigarettes    Quit date: 11/26/1988  . Smokeless tobacco: Never Used  . Alcohol use 2.4 oz/week    4 Glasses of wine per week     Comment: occ     Allergies   Patient has no known allergies.   Review of Systems Review of Systems  Constitutional: Negative for chills and fever.  HENT: Negative for facial swelling and sore throat.   Respiratory: Negative for shortness of breath.   Cardiovascular: Negative for chest pain.  Gastrointestinal: Positive for abdominal pain (chronic) and nausea. Negative for vomiting.  Genitourinary: Negative for dysuria.  Musculoskeletal: Negative for back pain.  Skin: Negative for rash and wound.  Neurological: Positive for light-headedness. Negative for headaches.  Psychiatric/Behavioral: The patient is not nervous/anxious.      Physical Exam Updated Vital Signs BP 130/89   Pulse 77   Temp 98.9 F (37.2 C) (Oral)   Resp 17   SpO2 96%   Physical Exam  Constitutional: She appears well-developed and well-nourished. No distress.  HENT:  Head: Normocephalic and atraumatic.  Right Ear: Tympanic membrane  normal.  Left Ear: Tympanic membrane normal.  Mouth/Throat: Oropharynx is clear and moist. No oropharyngeal exudate.  Eyes: Conjunctivae and EOM are normal. Pupils are equal, round, and reactive to light. Right eye exhibits no discharge. Left eye exhibits no discharge. No scleral icterus.  Neck: Normal range of motion. Neck supple. No thyromegaly present.  Cardiovascular: Normal rate, regular rhythm, normal heart sounds and intact distal pulses.  Exam reveals no gallop and no friction rub.   No murmur heard. Pulmonary/Chest: Effort normal and breath sounds normal. No stridor. No respiratory distress. She has no wheezes. She has no rales.  Abdominal: Soft. Bowel sounds are normal. She exhibits no distension. There is no tenderness. There is no rebound and  no guarding.  Musculoskeletal: She exhibits no edema.  Lymphadenopathy:    She has no cervical adenopathy.  Neurological: She is alert. Coordination normal.  CN 3-12 intact; normal sensation throughout; 5/5 strength in all 4 extremities; equal bilateral grip strength; no ataxia on finger to nose; normal heel-to-shin test  Skin: Skin is warm and dry. No rash noted. She is not diaphoretic. No pallor.  Psychiatric: She has a normal mood and affect.  Nursing note and vitals reviewed.    ED Treatments / Results  Labs (all labs ordered are listed, but only abnormal results are displayed) Labs Reviewed  COMPREHENSIVE METABOLIC PANEL - Abnormal; Notable for the following:       Result Value   Potassium 3.4 (*)    Glucose, Bld 108 (*)    Creatinine, Ser 1.24 (*)    ALT 11 (*)    GFR calc non Af Amer 42 (*)    GFR calc Af Amer 48 (*)    All other components within normal limits  CBC WITH DIFFERENTIAL/PLATELET  Randolm Idol, ED    EKG  EKG Interpretation  Date/Time:  Friday Jun 10 2016 16:18:21 EDT Ventricular Rate:  76 PR Interval:    QRS Duration: 101 QT Interval:  420 QTC Calculation: 473 R Axis:   62 Text Interpretation:   Atrial fibrillation Ventricular premature complex Borderline T abnormalities, anterior leads No significant change since last tracing Abnormal ekg Confirmed by Carmin Muskrat  MD [phone]) on 06/10/2016 4:20:57 PM       Radiology Dg Chest 2 View  Result Date: 06/10/2016 CLINICAL DATA:  Lightheadedness EXAM: CHEST  2 VIEW COMPARISON:  04/26/2016 FINDINGS: Chronic cardiomegaly and aortic tortuosity. Borderline hyperinflation and diaphragm flattening. There is no edema, consolidation, effusion, or pneumothorax. IMPRESSION: No evidence of active disease. Electronically Signed   By: Monte Fantasia M.D.   On: 06/10/2016 15:20    Procedures Procedures (including critical care time)  Medications Ordered in ED Medications  meclizine (ANTIVERT) tablet 12.5 mg (not administered)  sodium chloride 0.9 % bolus 500 mL (500 mLs Intravenous New Bag/Given 06/10/16 1457)  ondansetron (ZOFRAN) injection 4 mg (4 mg Intravenous Given 06/10/16 1457)     Initial Impression / Assessment and Plan / ED Course  I have reviewed the triage vital signs and the nursing notes.  Pertinent labs & imaging results that were available during my care of the patient were reviewed by me and considered in my medical decision making (see chart for details).     Patient with lightheadedness resolved in the ED with 500 mL fluid. CBC unremarkable. CMP shows potassium 3.4, glucose 108, creatinine 1.24. Troponin negative. CXR shows no active cardiopulmonary disease. EKG shows atrial fibrillation, no significant change since last tracing. CHADS2 score 2. Patient anticoagulated on Eliquis. We'll discharge patient home with meclizine. Follow-up to PCP. Return precautions discussed. Patient vitals stable throughout ED course and discharged in satisfactory condition. Patient also evaluated by Dr. Vanita Panda who guided the patient's management and agrees with plan.  Final Clinical Impressions(s) / ED Diagnoses   Final diagnoses:    Lightheadedness  Nausea    New Prescriptions New Prescriptions   MECLIZINE (ANTIVERT) 12.5 MG TABLET    Take 1 tablet (12.5 mg total) by mouth 3 (three) times daily as needed for dizziness.     Frederica Kuster, PA-C 06/10/16 1622    Carmin Muskrat, MD 06/14/16 [phone]

## 2016-06-10 NOTE — ED Triage Notes (Signed)
Patient present today with complaints of dizzyness and nausea. Patient reports she was playing a machine last night  and it started feeling dizzy last night. Patient reports she laid on the floor for about an hour and it went away. Patient reports similar episode today. Patient alert and oriented on arrival. Patient was Dx with afib 2 months ago. Patient reports she has been talking her medication.

## 2016-06-13 ENCOUNTER — Ambulatory Visit: Payer: TRICARE For Life (TFL) | Admitting: Student

## 2016-06-21 ENCOUNTER — Ambulatory Visit: Payer: Medicare Other | Admitting: Student

## 2016-08-18 ENCOUNTER — Encounter: Payer: Medicare Other | Admitting: Adult Health

## 2016-08-19 ENCOUNTER — Ambulatory Visit (HOSPITAL_BASED_OUTPATIENT_CLINIC_OR_DEPARTMENT_OTHER): Payer: Medicare Other | Admitting: Adult Health

## 2016-08-19 VITALS — BP 160/117 | HR 97 | Temp 98.3°F | Resp 18 | Ht 69.0 in | Wt 232.8 lb

## 2016-08-19 DIAGNOSIS — C50411 Malignant neoplasm of upper-outer quadrant of right female breast: Secondary | ICD-10-CM | POA: Diagnosis not present

## 2016-08-19 DIAGNOSIS — Z170 Estrogen receptor positive status [ER+]: Secondary | ICD-10-CM | POA: Diagnosis not present

## 2016-08-19 DIAGNOSIS — M8580 Other specified disorders of bone density and structure, unspecified site: Secondary | ICD-10-CM | POA: Diagnosis not present

## 2016-08-19 DIAGNOSIS — N951 Menopausal and female climacteric states: Secondary | ICD-10-CM

## 2016-08-19 NOTE — Progress Notes (Signed)
CLINIC:  Survivorship   REASON FOR VISIT:  Routine follow-up for history of breast cancer.   BRIEF ONCOLOGIC HISTORY:    Breast cancer of upper-outer quadrant of right female breast ([place])   10/31/2012 Mammogram    Right breast: 1.6 cm irregular high density mass with indistinct margins in UOQ      10/31/2012 Breast US    Right breast: 1.5 cm lobulated mass without axillary abnormality      11/01/2012 Initial Biopsy    Right breast core needle bx: DCIS involving papilloma grade 1, ER+ (100%), PR+ (100%); biopsies obtained lateral and medial to this mass on the same day showed an identical morphology (3 total)      11/07/2012 Breast MRI    Right breast: total area of involvement measuring 13.2 cm, extending from the central to the upper lateral right breast. There were no abnormal appearing lymph nodes. Left breast was unremarkable.      11/07/2012 Clinical Stage    Stage 0: Tis N0      12/03/2012 Definitive Surgery    Right mastectomy/SLNB: invasive ductal carcinoma, grade 2, spans 9 cm, ER+ (99%), PR+ (96%), HER2/neu negative, Ki67 9%. 1 LN reveals metastatic carcinoma (1/1) micromet      12/03/2012 Oncotype testing    RS 6 (8% ROR)      12/03/2012 Pathologic Stage    Stage IIIA: pT3 pN1      01/07/2013 -  Anti-estrogen oral therapy    Anastrozole 1 mg daily. Planned duration of therapy 5 years.        INTERVAL HISTORY:  Carrie Key presents to the Survivorship Clinic today for routine follow-up for her history of breast cancer.  Overall, she reports feeling quite well. She is taking Anastrozole daily and tolerates it well.  She does have some hot flashes at night and gabapentin at night which helps.  She is feeling well.  She sees her PCP regularly.  She exercises twice a week to group exercise classes.  Last bone density was 02/25/2015 and demonstrated osteopenia in her right hip with a t score of -1.7.      REVIEW OF SYSTEMS:  Review of Systems  Constitutional:  Negative for appetite change, chills, diaphoresis, fatigue and fever.  HENT:   Negative for hearing loss and lump/mass.   Eyes: Negative for eye problems and icterus.  Respiratory: Negative for chest tightness, cough and shortness of breath.   Cardiovascular: Negative for chest pain, leg swelling and palpitations.  Gastrointestinal: Negative for abdominal distention and abdominal pain.  Endocrine: Positive for hot flashes.  Musculoskeletal: Negative for arthralgias.  Skin: Negative for itching and rash.  Neurological: Negative for dizziness, extremity weakness, headaches and numbness.  Hematological: Negative for adenopathy. Does not bruise/bleed easily.  Psychiatric/Behavioral: Negative for depression. The patient is not nervous/anxious.    Breast: Denies any new nodularity, masses, tenderness, nipple changes, or nipple discharge.       PAST MEDICAL/SURGICAL HISTORY:  Past Medical History:  Diagnosis Date  . Atrial fibrillation ([place])   . Breast cancer ([place])   . Cancer ([place])   . CKD (chronic kidney disease), stage III 07/10/2014  . Diabetes mellitus without complication ([place])   . GERD (gastroesophageal reflux disease)   . Heart murmur   . Hyperlipemia   . Hypertension   . Wears glasses    Past Surgical History:  Procedure Laterality Date  . ABDOMINAL HYSTERECTOMY    . BACK SURGERY  2002   lumbar lam  .  CARDIAC CATHETERIZATION  2003  . COLONOSCOPY    . SIMPLE MASTECTOMY WITH AXILLARY SENTINEL NODE BIOPSY Right 12/03/2012   Procedure: RIGHT SIMPLE MASTECTOMY WITH RIGHT  AXILLARY SENTINEL LYMPH NODE BIOPSY;  Surgeon: Shann Medal, MD;  Location: [facility];  Service: General;  Laterality: Right;  . UPPER GI ENDOSCOPY       ALLERGIES:  No Known Allergies   CURRENT MEDICATIONS:  Outpatient Encounter Prescriptions as of 08/19/2016  Medication Sig Note  . anastrozole (ARIMIDEX) 1 MG tablet Take 1 tablet (1 mg total) by mouth daily.   Marland Kitchen apixaban (ELIQUIS) 5  MG TABS tablet Take 1 tablet (5 mg total) by mouth 2 (two) times daily.   . cloNIDine (CATAPRES) 0.3 MG tablet Take 1 tablet (0.3 mg total) by mouth 2 (two) times daily. MUST GET REFILLS WITH PCP   . furosemide (LASIX) 20 MG tablet Take 1 tablet (20 mg total) by mouth daily.   Marland Kitchen gabapentin (NEURONTIN) 300 MG capsule Take 1 capsule (300 mg total) by mouth at bedtime.   Marland Kitchen glimepiride (AMARYL) 1 MG tablet Take 1 mg by mouth daily with breakfast.   . meclizine (ANTIVERT) 12.5 MG tablet Take 1 tablet (12.5 mg total) by mouth 3 (three) times daily as needed for dizziness.   . pioglitazone (ACTOS) 45 MG tablet Take 45 mg by mouth daily.   . potassium chloride SA (K-DUR,KLOR-CON) 20 MEQ tablet Take 1 tablet (20 mEq total) by mouth daily.   . rosuvastatin (CRESTOR) 10 MG tablet Take 10 mg by mouth daily.   . traMADol (ULTRAM) 50 MG tablet Take 1 tablet (50 mg total) by mouth every 6 (six) hours as needed. for pain   . amLODipine (NORVASC) 5 MG tablet Take 1 tablet (5 mg total) by mouth daily. (Patient not taking: Reported on 08/19/2016)   . carvedilol (COREG) 3.125 MG tablet Take 1 tablet (3.125 mg total) by mouth 2 (two) times daily. (Patient not taking: Reported on 08/19/2016)   . docusate sodium (COLACE) 100 MG capsule Take 200 mg by mouth 2 (two) times daily as needed for mild constipation. 06/21/2014: Only takes prn  . fluconazole (DIFLUCAN) 100 MG tablet Take 1 tablet (100 mg total) by mouth daily. (Patient not taking: Reported on 06/10/2016) 06/21/2014: Only takes when tongue is turning white  . ibuprofen (ADVIL,MOTRIN) 200 MG tablet Take 400 mg by mouth every 6 (six) hours as needed for mild pain or moderate pain.   Marland Kitchen omeprazole (PRILOSEC) 40 MG capsule Take 40 mg by mouth daily.     No facility-administered encounter medications on file as of 08/19/2016.      ONCOLOGIC FAMILY HISTORY:  Family History  Problem Relation Age of Onset  . Hypertension Mother   . Stroke Mother   . Hypertension Father     . Heart attack Father   . Hypertension Paternal Grandmother       SOCIAL HISTORY:  Carrie Key is widowed, and lives alone in [place], [place].  She has 4 children and they live in [place] and [place].  Carrie Key is currently retired.  She denies any current or history of tobacco, alcohol, or illicit drug use.     PHYSICAL EXAMINATION:  Vital Signs: Vitals:   08/19/16 1447  BP: (!) 160/117  Pulse: 97  Resp: 18  Temp: 98.3 F (36.8 C)   Filed Weights   08/19/16 1447  Weight: 232 lb 12.8 oz (105.6 kg)   General: Well-nourished, well-appearing female in no  acute distress.  Accompanied by her friend Carrie Key  today.   HEENT: Head is normocephalic.  Pupils equal and reactive to light. Conjunctivae clear without exudate.  Sclerae anicteric. Oral mucosa is pink, moist.  Oropharynx is pink without lesions or erythema.  Lymph: No cervical, supraclavicular, or infraclavicular lymphadenopathy noted on palpation.  Cardiovascular: Regular rate and rhythm.Marland Kitchen Respiratory: Clear to auscultation bilaterally. Chest expansion symmetric; breathing non-labored.  Breast Exam:  -Left breast: No appreciable masses on palpation. No skin redness, thickening, or peau d'orange appearance; no nipple retraction or nipple discharge; -Right breast: s/p mastectomy, no nodularity or skin change noted -Axilla: No axillary adenopathy bilaterally.  GI: Abdomen soft and round; non-tender, non-distended. Bowel sounds normoactive. No hepatosplenomegaly.   GU: Deferred.  Neuro: No focal deficits. Steady gait.  Psych: Mood and affect normal and appropriate for situation.  MSK: No focal spinal tenderness to palpation, full range of motion in bilateral upper extremities Extremities: No edema. Skin: Warm and dry.  LABORATORY DATA:  None for this visit   DIAGNOSTIC IMAGING:  Most recent mammogram: not available in computer from [facility]:  Carrie Key is a pleasant 76 y.o.  female with history of Stage IIIA right breast invasive ductal carcinoma, ER+/PR+/HER2-, diagnosed in 10/2012, treated with mastectomy, and anti-estrogen therapy with Anastrozole beginning in 01/2013.  She presents to the Survivorship Clinic for surveillance and routine follow-up.   1. History of breast cancer:  Ms. Blizzard is currently clinically and radiographically without evidence of disease or recurrence of breast cancer. She will be due for mammogram in 08/2017.  She will continue her anti-estrogen therapy with Anastrozole, with plans to continue for 5 years.  She will return to the cancer center to see her medical oncologist, Dr. Jana Hakim, in 02/2017.  I encouraged her to call me with any questions or concerns before her next visit at the cancer center, and I would be happy to see her sooner, if needed.    2. Bone health:  Given Carrie Key's age, history of breast cancer, and her current anti-estrogen therapy with Anastrozole, she is at risk for bone demineralization. Her last DEXA scan was on 02/25/2015 and demonstrated mild osteopenia.  In the meantime, she was encouraged to increase her consumption of foods rich in calcium, as well as increase her weight-bearing activities.  She was given education on specific food and activities to promote bone health.  3. Cancer screening:  Due to Carrie Key's history and her age, she should receive screening for skin cancers, colon cancer. She was encouraged to follow-up with her PCP for appropriate cancer screenings.   4. Health maintenance and wellness promotion: Carrie Key was encouraged to consume 5-7 servings of fruits and vegetables per day. She was also encouraged to engage in moderate to vigorous exercise for 30 minutes per day most days of the week. She was instructed to limit her alcohol consumption and continue to abstain from tobacco use.    Dispo:  -Return to cancer center in 6 months for follow up with Dr. Jana Hakim.   -Mammogram in 08/2017 -Bone  Density in 02/2017   A total of (30) minutes of face-to-face time was spent with this patient with greater than 50% of that time in counseling and care-coordination.   Gardenia Phlegm, NP [facility] [phone]   Note: PRIMARY CARE PROVIDER [place], [place] [phone]

## 2016-08-19 NOTE — Patient Instructions (Signed)
Shoulder Exercises Ask your health care provider which exercises are safe for you. Do exercises exactly as told by your health care provider and adjust them as directed. It is normal to feel mild stretching, pulling, tightness, or discomfort as you do these exercises, but you should stop right away if you feel sudden pain or your pain gets worse.Do not begin these exercises until told by your health care provider. RANGE OF MOTION EXERCISES These exercises warm up your muscles and joints and improve the movement and flexibility of your shoulder. These exercises also help to relieve pain, numbness, and tingling. These exercises involve stretching your injured shoulder directly. Exercise A: Pendulum  1. Stand near a wall or a surface that you can hold onto for balance. 2. Bend at the waist and let your left / right arm hang straight down. Use your other arm to support you. Keep your back straight and do not lock your knees. 3. Relax your left / right arm and shoulder muscles, and move your hips and your trunk so your left / right arm swings freely. Your arm should swing because of the motion of your body, not because you are using your arm or shoulder muscles. 4. Keep moving your body so your arm swings in the following directions, as told by your health care provider: ? Side to side. ? Forward and backward. ? In clockwise and counterclockwise circles. 5. Continue each motion for __________ seconds, or for as long as told by your health care provider. 6. Slowly return to the starting position. Repeat __________ times. Complete this exercise __________ times a day. Exercise B:Flexion, Standing  1. Stand and hold a broomstick, a cane, or a similar object. Place your hands a little more than shoulder-width apart on the object. Your left / right hand should be palm-up, and your other hand should be palm-down. 2. Keep your elbow straight and keep your shoulder muscles relaxed. Push the stick down with  your healthy arm to raise your left / right arm in front of your body, and then over your head until you feel a stretch in your shoulder. ? Avoid shrugging your shoulder while you raise your arm. Keep your shoulder blade tucked down toward the middle of your back. 3. Hold for __________ seconds. 4. Slowly return to the starting position. Repeat __________ times. Complete this exercise __________ times a day. Exercise C: Abduction, Standing 1. Stand and hold a broomstick, a cane, or a similar object. Place your hands a little more than shoulder-width apart on the object. Your left / right hand should be palm-up, and your other hand should be palm-down. 2. While keeping your elbow straight and your shoulder muscles relaxed, push the stick across your body toward your left / right side. Raise your left / right arm to the side of your body and then over your head until you feel a stretch in your shoulder. ? Do not raise your arm above shoulder height, unless your health care provider tells you to do that. ? Avoid shrugging your shoulder while you raise your arm. Keep your shoulder blade tucked down toward the middle of your back. 3. Hold for __________ seconds. 4. Slowly return to the starting position. Repeat __________ times. Complete this exercise __________ times a day. Exercise D:Internal Rotation  1. Place your left / right hand behind your back, palm-up. 2. Use your other hand to dangle an exercise band, a towel, or a similar object over your shoulder. Grasp the band with   your left / right hand so you are holding onto both ends. 3. Gently pull up on the band until you feel a stretch in the front of your left / right shoulder. ? Avoid shrugging your shoulder while you raise your arm. Keep your shoulder blade tucked down toward the middle of your back. 4. Hold for __________ seconds. 5. Release the stretch by letting go of the band and lowering your hands. Repeat __________ times. Complete  this exercise __________ times a day. STRETCHING EXERCISES These exercises warm up your muscles and joints and improve the movement and flexibility of your shoulder. These exercises also help to relieve pain, numbness, and tingling. These exercises are done using your healthy shoulder to help stretch the muscles of your injured shoulder. Exercise E: Corner Stretch (External Rotation and Abduction)  1. Stand in a doorway with one of your feet slightly in front of the other. This is called a staggered stance. If you cannot reach your forearms to the door frame, stand facing a corner of a room. 2. Choose one of the following positions as told by your health care provider: ? Place your hands and forearms on the door frame above your head. ? Place your hands and forearms on the door frame at the height of your head. ? Place your hands on the door frame at the height of your elbows. 3. Slowly move your weight onto your front foot until you feel a stretch across your chest and in the front of your shoulders. Keep your head and chest upright and keep your abdominal muscles tight. 4. Hold for __________ seconds. 5. To release the stretch, shift your weight to your back foot. Repeat __________ times. Complete this stretch __________ times a day. Exercise F:Extension, Standing 1. Stand and hold a broomstick, a cane, or a similar object behind your back. ? Your hands should be a little wider than shoulder-width apart. ? Your palms should face away from your back. 2. Keeping your elbows straight and keeping your shoulder muscles relaxed, move the stick away from your body until you feel a stretch in your shoulder. ? Avoid shrugging your shoulders while you move the stick. Keep your shoulder blade tucked down toward the middle of your back. 3. Hold for __________ seconds. 4. Slowly return to the starting position. Repeat __________ times. Complete this exercise __________ times a day. STRENGTHENING  EXERCISES These exercises build strength and endurance in your shoulder. Endurance is the ability to use your muscles for a long time, even after they get tired. Exercise G:External Rotation  1. Sit in a stable chair without armrests. 2. Secure an exercise band at elbow height on your left / right side. 3. Place a soft object, such as a folded towel or a small pillow, between your left / right upper arm and your body to move your elbow a few inches away (about 10 cm) from your side. 4. Hold the end of the band so it is tight and there is no slack. 5. Keeping your elbow pressed against the soft object, move your left / right forearm out, away from your abdomen. Keep your body steady so only your forearm moves. 6. Hold for __________ seconds. 7. Slowly return to the starting position. Repeat __________ times. Complete this exercise __________ times a day. Exercise H:Shoulder Abduction  1. Sit in a stable chair without armrests, or stand. 2. Hold a __________ weight in your left / right hand, or hold an exercise band with both hands.   3. Start with your arms straight down and your left / right palm facing in, toward your body. 4. Slowly lift your left / right hand out to your side. Do not lift your hand above shoulder height unless your health care provider tells you that this is safe. ? Keep your arms straight. ? Avoid shrugging your shoulder while you do this movement. Keep your shoulder blade tucked down toward the middle of your back. 5. Hold for __________ seconds. 6. Slowly lower your arm, and return to the starting position. Repeat __________ times. Complete this exercise __________ times a day. Exercise I:Shoulder Extension 1. Sit in a stable chair without armrests, or stand. 2. Secure an exercise band to a stable object in front of you where it is at shoulder height. 3. Hold one end of the exercise band in each hand. Your palms should face each other. 4. Straighten your elbows and  lift your hands up to shoulder height. 5. Step back, away from the secured end of the exercise band, until the band is tight and there is no slack. 6. Squeeze your shoulder blades together as you pull your hands down to the sides of your thighs. Stop when your hands are straight down by your sides. Do not let your hands go behind your body. 7. Hold for __________ seconds. 8. Slowly return to the starting position. Repeat __________ times. Complete this exercise __________ times a day. Exercise J:Standing Shoulder Row 1. Sit in a stable chair without armrests, or stand. 2. Secure an exercise band to a stable object in front of you so it is at waist height. 3. Hold one end of the exercise band in each hand. Your palms should be in a thumbs-up position. 4. Bend each of your elbows to an "L" shape (about 90 degrees) and keep your upper arms at your sides. 5. Step back until the band is tight and there is no slack. 6. Slowly pull your elbows back behind you. 7. Hold for __________ seconds. 8. Slowly return to the starting position. Repeat __________ times. Complete this exercise __________ times a day. Exercise K:Shoulder Press-Ups  1. Sit in a stable chair that has armrests. Sit upright, with your feet flat on the floor. 2. Put your hands on the armrests so your elbows are bent and your fingers are pointing forward. Your hands should be about even with the sides of your body. 3. Push down on the armrests and use your arms to lift yourself off of the chair. Straighten your elbows and lift yourself up as much as you comfortably can. ? Move your shoulder blades down, and avoid letting your shoulders move up toward your ears. ? Keep your feet on the ground. As you get stronger, your feet should support less of your body weight as you lift yourself up. 4. Hold for __________ seconds. 5. Slowly lower yourself back into the chair. Repeat __________ times. Complete this exercise __________ times a  day. Exercise L: Wall Push-Ups  1. Stand so you are facing a stable wall. Your feet should be about one arm-length away from the wall. 2. Lean forward and place your palms on the wall at shoulder height. 3. Keep your feet flat on the floor as you bend your elbows and lean forward toward the wall. 4. Hold for __________ seconds. 5. Straighten your elbows to push yourself back to the starting position. Repeat __________ times. Complete this exercise __________ times a day. This information is not intended to replace advice   given to you by your health care provider. Make sure you discuss any questions you have with your health care provider. Document Released: 12/08/2004 Document Revised: 10/19/2015 Document Reviewed: 10/05/2014 Elsevier Interactive Patient Education  2018 [employer] Inc[employer]  

## 2016-08-21 ENCOUNTER — Encounter: Payer: Self-pay | Admitting: Adult Health

## 2016-09-05 DIAGNOSIS — Z853 Personal history of malignant neoplasm of breast: Secondary | ICD-10-CM | POA: Diagnosis not present

## 2016-09-05 DIAGNOSIS — Z1231 Encounter for screening mammogram for malignant neoplasm of breast: Secondary | ICD-10-CM | POA: Diagnosis not present

## 2016-09-07 ENCOUNTER — Other Ambulatory Visit: Payer: Self-pay

## 2016-09-07 DIAGNOSIS — C50411 Malignant neoplasm of upper-outer quadrant of right female breast: Secondary | ICD-10-CM

## 2016-09-07 MED ORDER — ANASTROZOLE 1 MG PO TABS: 1.0000 mg | ORAL_TABLET | Freq: Every day | ORAL | 11 refills | Status: DC

## 2016-09-08 ENCOUNTER — Other Ambulatory Visit: Payer: Self-pay | Admitting: Emergency Medicine

## 2016-09-28 DIAGNOSIS — E119 Type 2 diabetes mellitus without complications: Secondary | ICD-10-CM | POA: Diagnosis not present

## 2016-09-28 DIAGNOSIS — I10 Essential (primary) hypertension: Secondary | ICD-10-CM | POA: Diagnosis not present

## 2016-09-28 DIAGNOSIS — R350 Frequency of micturition: Secondary | ICD-10-CM | POA: Diagnosis not present

## 2016-11-21 ENCOUNTER — Other Ambulatory Visit: Payer: Self-pay | Admitting: Oncology

## 2016-11-21 DIAGNOSIS — C50411 Malignant neoplasm of upper-outer quadrant of right female breast: Secondary | ICD-10-CM

## 2016-11-21 DIAGNOSIS — Z853 Personal history of malignant neoplasm of breast: Secondary | ICD-10-CM

## 2016-12-08 DIAGNOSIS — K219 Gastro-esophageal reflux disease without esophagitis: Secondary | ICD-10-CM | POA: Diagnosis not present

## 2016-12-08 DIAGNOSIS — R0982 Postnasal drip: Secondary | ICD-10-CM | POA: Diagnosis not present

## 2016-12-08 DIAGNOSIS — Z86010 Personal history of colonic polyps: Secondary | ICD-10-CM | POA: Diagnosis not present

## 2016-12-08 DIAGNOSIS — K2270 Barrett's esophagus without dysplasia: Secondary | ICD-10-CM | POA: Diagnosis not present

## 2016-12-26 DIAGNOSIS — R5383 Other fatigue: Secondary | ICD-10-CM | POA: Diagnosis not present

## 2016-12-26 DIAGNOSIS — I10 Essential (primary) hypertension: Secondary | ICD-10-CM | POA: Diagnosis not present

## 2016-12-26 DIAGNOSIS — R350 Frequency of micturition: Secondary | ICD-10-CM | POA: Diagnosis not present

## 2016-12-26 DIAGNOSIS — R7309 Other abnormal glucose: Secondary | ICD-10-CM | POA: Diagnosis not present

## 2016-12-26 DIAGNOSIS — E785 Hyperlipidemia, unspecified: Secondary | ICD-10-CM | POA: Diagnosis not present

## 2017-02-16 NOTE — Progress Notes (Signed)
ID: Carrie Key OB: 02-10-1940  MR#: [id number]  WPV#:[id number]  PCP: Glendale Chard, MD GYN:   SU: Alphonsa Overall OTHER MD: Thea Silversmith, Robert Buccini  CHIEF COMPLAINT: Right Breast Cancer  CURRENT TREATMENT: Anastrozole  BREAST CANCER HISTORY: From the initial intake note:  Carrie Key noted a lump in the upper-outer quadrant of her right breast and brought it to the attention of her primary care physician. She was set up for bilateral diagnostic mammography 10/31/2012 at [facility]. This showed her breast to be category A., almost entirely fatty. A 1.6 cm irregular high density mass with indistinct margins was noted in the right upper outer quadrant. This was palpable. Ultrasound of the right breast showed the mass to measure 1.5 cm, and to be lobulated. There was no axillary abnormality by ultrasound. Biopsy of the mass in question 11/01/2012 showed ([id number]) invasive ductal carcinoma, grade 1, estrogen and progesterone receptors both 100% positive. Biopsies obtained lateral and medial to this mass on the same day showed an identical morphology.  On 11/07/2012 the patient underwent bilateral breast MRI, which showed a total area of involvement measuring 13.2 cm, extending from the central to the upper lateral right breast. There were no abnormal appearing lymph nodes in the left breast was unremarkable.  Her subsequent history is as detailed below.   INTERVAL HISTORY: Carrie Key returns today for follow up of her breast cancer. She continues on anastrozole, with good tolerance. She notes that her hot flashes are intense somtimes. She notes issues with vaginal dryness that doesn't bother her.   Since her last visit, she underwent routine unilateral left breast mammography with CAD and tomography on 09/05/2016 at [facility] showing: breast density category A. There was no evidence of malignancy. S/P Right mastectomy.  REVIEW OF SYSTEMS: Carrie Key reports that she feels pain and pulling in her right  armpit that extends to the right breast bone. She notes that this pain is occasional and happens sometimes about twice per day. She notes that she takes gabapentin that helps some, but it makes her sleepy sometimes. She notes that she went to the hospital for HTN. She notes that the doctor told her that she had a lump in her chest, and they wanted to do surgery on her. She reports that they did not do any x-rays or scans on her. She notes that this was about 4-5 months ago. She notes that her PCP gave her blood thinners.  She reports that for the holidays, she went to her grandson's house. She denies unusual headaches, visual changes, nausea, vomiting, or dizziness. There has been no unusual cough, phlegm production, or pleurisy. This been no change in bowel or bladder habits. She denies unexplained fatigue or unexplained weight loss, bleeding, rash, or fever. A detailed review of systems was otherwise stable.   PAST MEDICAL HISTORY: Past Medical History:  Diagnosis Date  . Atrial fibrillation ([place])   . Breast cancer ([place])   . Cancer ([place])   . CKD (chronic kidney disease), stage III 07/10/2014  . Diabetes mellitus without complication ([place])   . GERD (gastroesophageal reflux disease)   . Heart murmur   . Hyperlipemia   . Hypertension   . Wears glasses     PAST SURGICAL HISTORY: Past Surgical History:  Procedure Laterality Date  . ABDOMINAL HYSTERECTOMY    . BACK SURGERY  2002   lumbar lam  . CARDIAC CATHETERIZATION  2003  . COLONOSCOPY    . SIMPLE MASTECTOMY WITH AXILLARY SENTINEL NODE BIOPSY Right 12/03/2012  Procedure: RIGHT SIMPLE MASTECTOMY WITH RIGHT  AXILLARY SENTINEL LYMPH NODE BIOPSY;  Surgeon: Shann Medal, MD;  Location: [place];  Service: General;  Laterality: Right;  . UPPER GI ENDOSCOPY      FAMILY HISTORY  (updated 01/07/2013) Family History  Problem Relation Age of Onset  . Hypertension Mother   . Stroke Mother   . Hypertension Father   . Heart  attack Father   . Hypertension Paternal Grandmother    The patient's father died at the age of 58 from a myocardial infarction. The patient's mother died at the age of 42 from a stroke. The patient has 5 brothers, 4 sisters. There is no history of breast or ovarian cancer in the family  GYNECOLOGIC HISTORY:   (Updated 01/07/2013) Menarche age 41, first live birth age 89. The patient is GX P6. She stopped having periods in 1990. She is status post total abdominal hysterectomy with bilateral salpingo-oophorectomy. She never took hormone replacement.  SOCIAL HISTORY:   (Updated 01/07/2013) Carrie Key worked in [employer] at [employer] but retired 2000. She lives by herself, with no pets, although her friend and significant other, Carrie Key, visits daily. She has one son who died from a stroke at age 36. Son Carrie Key works for [employer] in [place]. Daughter Carrie Key works in a [employer] in [place]. Son Carrie Key lives in [place], and is currently unemployed. Son Carrie Key lives in [place] and works [place]. The patient has 11 grandchildren. She attends [employer]    ADVANCED DIRECTIVES: Not in place   HEALTH MAINTENANCE:  (Updated 01/07/2013) Social History   Tobacco Use  . Smoking status: Former Smoker    Packs/day: 0.30    Years: 30.00    Pack years: 9.00    Types: Cigarettes    Last attempt to quit: 11/26/1988    Years since quitting: 28.2  . Smokeless tobacco: Never Used  Substance Use Topics  . Alcohol use: Yes    Alcohol/week: 2.4 oz    Types: 4 Glasses of wine per week    Comment: occ  . Drug use: No     Colonoscopy: 2013  PAP: Status post hysterectomy  Bone density: Not on file  Lipid panel:  Dr. Karlton Lemon  No Known Allergies  Current Outpatient Medications  Medication Sig Dispense Refill  . anastrozole (ARIMIDEX) 1 MG tablet Take 1 tablet (1 mg total) by mouth daily. 30 tablet 11  . apixaban (ELIQUIS)  5 MG TABS tablet Take 1 tablet (5 mg total) by mouth 2 (two) times daily. 180 tablet 3  . cloNIDine (CATAPRES) 0.3 MG tablet Take 1 tablet (0.3 mg total) by mouth 2 (two) times daily. MUST GET REFILLS WITH PCP 90 tablet 0  . docusate sodium (COLACE) 100 MG capsule Take 200 mg by mouth 2 (two) times daily as needed for mild constipation.    . fluconazole (DIFLUCAN) 100 MG tablet Take 1 tablet (100 mg total) by mouth daily. 30 tablet 3  . furosemide (LASIX) 20 MG tablet Take 1 tablet (20 mg total) by mouth daily. 30 tablet 3  . gabapentin (NEURONTIN) 300 MG capsule TAKE 1 CAPSULE BY MOUTH AT BEDTIME 30 capsule 6  . glimepiride (AMARYL) 1 MG tablet Take 1 mg by mouth daily with breakfast.    . ibuprofen (ADVIL,MOTRIN) 200 MG tablet Take 400 mg by mouth every 6 (six) hours as needed for mild pain or moderate pain.    Marland Kitchen  meclizine (ANTIVERT) 12.5 MG tablet Take 1 tablet (12.5 mg total) by mouth 3 (three) times daily as needed for dizziness. 20 tablet 0  . omeprazole (PRILOSEC) 40 MG capsule Take 40 mg by mouth daily.     . pioglitazone (ACTOS) 45 MG tablet Take 45 mg by mouth daily.    . potassium chloride SA (K-DUR,KLOR-CON) 20 MEQ tablet Take 1 tablet (20 mEq total) by mouth daily. 30 tablet 3  . rosuvastatin (CRESTOR) 10 MG tablet Take 10 mg by mouth daily.    . traMADol (ULTRAM) 50 MG tablet Take 1 tablet (50 mg total) by mouth every 6 (six) hours as needed. for pain 90 tablet 0  . amLODipine (NORVASC) 5 MG tablet Take 1 tablet (5 mg total) by mouth daily. (Patient not taking: Reported on 08/19/2016) 180 tablet 3  . carvedilol (COREG) 3.125 MG tablet Take 1 tablet (3.125 mg total) by mouth 2 (two) times daily. (Patient not taking: Reported on 08/19/2016) 180 tablet 3   No current facility-administered medications for this visit.     OBJECTIVE: Older African American woman who appears stated age 77:   02/20/17 1304  BP: (!) 178/100  Pulse: 99  Resp: (!) 22  Temp: 98.2 F (36.8 C)  SpO2:  100%     Body mass index is 34.75 kg/m.    ECOG FS: 1 Filed Weights   02/20/17 1304  Weight: 235 lb 4.8 oz (106.7 kg)   Sclerae unicteric, pupils round and equal No cervical or supraclavicular adenopathy Lungs no rales or rhonchi Heart regular rate and rhythm Abd soft, nontender, positive bowel sounds MSK no focal spinal tenderness, no upper extremity lymphedema; some discomfort to palpation on the right sternal edge Neuro: nonfocal, well oriented, appropriate affect Breasts: She is status post right mastectomy.  There is no evidence of chest wall recurrence.  Left breast is unremarkable.  Both axillae are benign  LAB RESULTS:  Lab Results  Component Value Date   WBC 7.3 06/10/2016   NEUTROABS 4.3 02/20/2017   HGB 12.2 06/10/2016   HCT 39.0 02/20/2017   MCV 94.9 02/20/2017   PLT 242 06/10/2016      Chemistry      Component Value Date/Time   NA 142 02/20/2017 1239   NA 140 01/28/2015 1132   K 3.6 02/20/2017 1239   K 3.6 01/28/2015 1132   CL 103 02/20/2017 1239   CO2 26 02/20/2017 1239   CO2 25 01/28/2015 1132   BUN 15 02/20/2017 1239   BUN 18.9 01/28/2015 1132   CREATININE 1.24 (H) 06/10/2016 1458   CREATININE 1.35 (H) 05/06/2016 1116   CREATININE 1.2 (H) 01/28/2015 1132      Component Value Date/Time   CALCIUM 9.9 02/20/2017 1239   CALCIUM 9.6 01/28/2015 1132   ALKPHOS 92 02/20/2017 1239   ALKPHOS 66 01/28/2015 1132   AST 31 02/20/2017 1239   AST 15 01/28/2015 1132   ALT 19 02/20/2017 1239   ALT <9 01/28/2015 1132   BILITOT 0.6 02/20/2017 1239   BILITOT 0.49 01/28/2015 1132       STUDIES: Since her last visit, she underwent routine unilateral left breast mammography with CAD and tomography on 09/05/2016 at [facility] showing: breast density category A. There was no evidence of malignancy. S/P Right mastectomy.  ASSESSMENT: 77 y.o. [place] woman status post right breast biopsy at 3 separate sites in the right breast, all 3 sites showing ductal carcinoma in  situ, low-grade, estrogen and progesterone receptors both 100% positive  (  1) Status post right mastectomy and sentinel lymph node sampling 12/03/2012 for a pT3 pN1, stage IIIA invasive ductal carcinoma, estrogen receptor 99% positive, progesterone receptor 96% positive, with an MIB-1 of 9% and no HER-2 amplification.  (2) Oncotype DX score of 6 predicts a distant recurrence risk of 8% within the next 10 years if the patient's only systemic treatment is tamoxifen for 5 years. It also predicts no benefit from chemotherapy  (3)  began anastrozole, 01/07/2013,  to continue for 5 years.  (a) D[facility]A scan at [facility] 02/25/2015 showed a T score of -1.4  PLAN: Carrie Key is now a little over 4 years out from definitive surgery for her breast cancer with no evidence of disease recurrence.  This is very favorable.  She is tolerating anastrozole moderately well and the plan is to continue that for a minimum of 5 years, which will take [place] to December 2019.  She needs a repeat bone density.  We have written for that at the same time as her next mammogram, in August of this year.  I think the pain that she is experiencing in her right upper chest wall is partly due to her earlier surgery, and partly due to costochondritis.  Nevertheless she would be at high risk for recurrence given that she had stage III a disease.  Accordingly before she sees me again in September I am going to obtain a CT scan of the chest.  If everything looks good at that time we will consider her "graduating" from follow-up here.  Today I refilled her anastrozole, gabapentin, and tramadol.  She knows to call for any other issues that may develop before her next visit here.    Magrinat, Virgie Dad, MD  02/20/17 1:44 PM Medical Oncology and Hematology [facility] [address] [place], [place] [address] Tel. [phone]    Fax. [phone]  This document serves as a record of services personally performed by Lurline Del, MD. It was created on his behalf by Sheron Nightingale, a trained medical scribe. The creation of this record is based on the scribe's personal observations and the provider's statements to them.   I have reviewed the above documentation for accuracy and completeness, and I agree with the above.

## 2017-02-17 ENCOUNTER — Other Ambulatory Visit: Payer: Self-pay

## 2017-02-17 DIAGNOSIS — C50411 Malignant neoplasm of upper-outer quadrant of right female breast: Secondary | ICD-10-CM

## 2017-02-20 ENCOUNTER — Telehealth: Payer: Self-pay | Admitting: Oncology

## 2017-02-20 ENCOUNTER — Inpatient Hospital Stay: Payer: Medicare HMO

## 2017-02-20 ENCOUNTER — Other Ambulatory Visit: Payer: Self-pay | Admitting: *Deleted

## 2017-02-20 ENCOUNTER — Inpatient Hospital Stay: Payer: Medicare HMO | Attending: Oncology | Admitting: Oncology

## 2017-02-20 VITALS — BP 178/100 | HR 99 | Temp 98.2°F | Resp 22 | Ht 69.0 in | Wt 235.3 lb

## 2017-02-20 DIAGNOSIS — Z853 Personal history of malignant neoplasm of breast: Secondary | ICD-10-CM

## 2017-02-20 DIAGNOSIS — Z87891 Personal history of nicotine dependence: Secondary | ICD-10-CM | POA: Insufficient documentation

## 2017-02-20 DIAGNOSIS — Z170 Estrogen receptor positive status [ER+]: Secondary | ICD-10-CM | POA: Diagnosis not present

## 2017-02-20 DIAGNOSIS — Z79811 Long term (current) use of aromatase inhibitors: Secondary | ICD-10-CM | POA: Diagnosis not present

## 2017-02-20 DIAGNOSIS — I129 Hypertensive chronic kidney disease with stage 1 through stage 4 chronic kidney disease, or unspecified chronic kidney disease: Secondary | ICD-10-CM | POA: Diagnosis not present

## 2017-02-20 DIAGNOSIS — Z90722 Acquired absence of ovaries, bilateral: Secondary | ICD-10-CM

## 2017-02-20 DIAGNOSIS — N183 Chronic kidney disease, stage 3 unspecified: Secondary | ICD-10-CM

## 2017-02-20 DIAGNOSIS — C50411 Malignant neoplasm of upper-outer quadrant of right female breast: Secondary | ICD-10-CM

## 2017-02-20 DIAGNOSIS — E785 Hyperlipidemia, unspecified: Secondary | ICD-10-CM | POA: Diagnosis not present

## 2017-02-20 DIAGNOSIS — R232 Flushing: Secondary | ICD-10-CM | POA: Insufficient documentation

## 2017-02-20 DIAGNOSIS — E1122 Type 2 diabetes mellitus with diabetic chronic kidney disease: Secondary | ICD-10-CM | POA: Diagnosis not present

## 2017-02-20 DIAGNOSIS — Z9011 Acquired absence of right breast and nipple: Secondary | ICD-10-CM | POA: Insufficient documentation

## 2017-02-20 DIAGNOSIS — M25559 Pain in unspecified hip: Secondary | ICD-10-CM

## 2017-02-20 DIAGNOSIS — K219 Gastro-esophageal reflux disease without esophagitis: Secondary | ICD-10-CM | POA: Diagnosis not present

## 2017-02-20 DIAGNOSIS — I480 Paroxysmal atrial fibrillation: Secondary | ICD-10-CM | POA: Diagnosis not present

## 2017-02-20 DIAGNOSIS — Z79899 Other long term (current) drug therapy: Secondary | ICD-10-CM

## 2017-02-20 DIAGNOSIS — Z90710 Acquired absence of both cervix and uterus: Secondary | ICD-10-CM | POA: Insufficient documentation

## 2017-02-20 DIAGNOSIS — Z7984 Long term (current) use of oral hypoglycemic drugs: Secondary | ICD-10-CM | POA: Diagnosis not present

## 2017-02-20 DIAGNOSIS — Z7901 Long term (current) use of anticoagulants: Secondary | ICD-10-CM | POA: Diagnosis not present

## 2017-02-20 LAB — CMP (CANCER CENTER ONLY)
ALT: 19 U/L (ref 0–55)
ANION GAP: 13 — AB (ref 3–11)
AST: 31 U/L (ref 5–34)
Albumin: 3.7 g/dL (ref 3.5–5.0)
Alkaline Phosphatase: 92 U/L (ref 40–150)
BUN: 15 mg/dL (ref 7–26)
CALCIUM: 9.9 mg/dL (ref 8.4–10.4)
CO2: 26 mmol/L (ref 22–29)
Chloride: 103 mmol/L (ref 98–109)
Creatinine: 1.37 mg/dL — ABNORMAL HIGH (ref 0.60–1.10)
GFR, EST AFRICAN AMERICAN: 43 mL/min — AB (ref >60)
GFR, Estimated: 37 mL/min — ABNORMAL LOW (ref >60)
Glucose, Bld: 87 mg/dL (ref 70–140)
Potassium: 3.6 mmol/L (ref 3.3–4.7)
SODIUM: 142 mmol/L (ref 136–145)
Total Bilirubin: 0.6 mg/dL (ref 0.2–1.2)
Total Protein: 7.7 g/dL (ref 6.4–8.3)

## 2017-02-20 LAB — CBC WITH DIFFERENTIAL (CANCER CENTER ONLY)
Basophils Absolute: 0 10*3/uL (ref 0.0–0.1)
Basophils Relative: 1 %
EOS ABS: 0.1 10*3/uL (ref 0.0–0.5)
EOS PCT: 2 %
HCT: 39 % (ref 34.8–46.6)
Hemoglobin: 12.6 g/dL (ref 11.6–15.9)
LYMPHS ABS: 1.5 10*3/uL (ref 0.9–3.3)
Lymphocytes Relative: 24 %
MCH: 30.7 pg (ref 25.1–34.0)
MCHC: 32.3 g/dL (ref 31.5–36.0)
MCV: 94.9 fL (ref 79.5–101.0)
MONO ABS: 0.5 10*3/uL (ref 0.1–0.9)
Monocytes Relative: 8 %
Neutro Abs: 4.3 10*3/uL (ref 1.5–6.5)
Neutrophils Relative %: 65 %
PLATELETS: 219 10*3/uL (ref 145–400)
RBC: 4.11 MIL/uL (ref 3.70–5.45)
RDW: 13.9 % (ref 11.2–16.1)
WBC Count: 6.5 10*3/uL (ref 3.9–10.3)

## 2017-02-20 MED ORDER — GABAPENTIN 300 MG PO CAPS: 300.0000 mg | ORAL_CAPSULE | Freq: Every day | ORAL | 6 refills | Status: AC

## 2017-02-20 MED ORDER — TRAMADOL HCL 50 MG PO TABS: 50.0000 mg | ORAL_TABLET | Freq: Four times a day (QID) | ORAL | 0 refills | Status: DC | PRN

## 2017-02-20 MED ORDER — ANASTROZOLE 1 MG PO TABS: 1.0000 mg | ORAL_TABLET | Freq: Every day | ORAL | 11 refills | Status: DC

## 2017-02-20 NOTE — Telephone Encounter (Signed)
Scheduled appts per 1/14 sch msg - patient did not want avs or calendar.

## 2017-03-23 DIAGNOSIS — E119 Type 2 diabetes mellitus without complications: Secondary | ICD-10-CM | POA: Diagnosis not present

## 2017-03-23 DIAGNOSIS — I10 Essential (primary) hypertension: Secondary | ICD-10-CM | POA: Diagnosis not present

## 2017-03-23 DIAGNOSIS — E785 Hyperlipidemia, unspecified: Secondary | ICD-10-CM | POA: Diagnosis not present

## 2017-03-23 DIAGNOSIS — J209 Acute bronchitis, unspecified: Secondary | ICD-10-CM | POA: Diagnosis not present

## 2017-03-23 DIAGNOSIS — R7309 Other abnormal glucose: Secondary | ICD-10-CM | POA: Diagnosis not present

## 2017-03-27 ENCOUNTER — Ambulatory Visit
Admission: RE | Admit: 2017-03-27 | Discharge: 2017-03-27 | Disposition: A | Discharge status: Home / Self Care | Payer: Medicare HMO | Source: Ambulatory Visit | Attending: Nurse Practitioner | Admitting: Nurse Practitioner

## 2017-03-27 ENCOUNTER — Other Ambulatory Visit: Payer: Self-pay | Admitting: Nurse Practitioner

## 2017-03-27 DIAGNOSIS — R059 Cough, unspecified: Secondary | ICD-10-CM

## 2017-03-27 DIAGNOSIS — R05 Cough: Secondary | ICD-10-CM

## 2017-04-17 NOTE — Progress Notes (Signed)
This encounter was created in error - please disregard.

## 2017-05-07 ENCOUNTER — Other Ambulatory Visit: Payer: Self-pay

## 2017-05-07 ENCOUNTER — Inpatient Hospital Stay (HOSPITAL_COMMUNITY)
Admission: EM | Admit: 2017-05-07 | Discharge: 2017-05-16 | DRG: 286 | Disposition: A | Discharge status: Home / Self Care | Payer: Medicare HMO | Attending: Cardiology | Admitting: Cardiology

## 2017-05-07 ENCOUNTER — Other Ambulatory Visit (HOSPITAL_COMMUNITY): Payer: Self-pay

## 2017-05-07 ENCOUNTER — Emergency Department (HOSPITAL_COMMUNITY): Payer: Medicare HMO

## 2017-05-07 ENCOUNTER — Encounter (HOSPITAL_COMMUNITY): Payer: Self-pay | Admitting: *Deleted

## 2017-05-07 DIAGNOSIS — E785 Hyperlipidemia, unspecified: Secondary | ICD-10-CM | POA: Diagnosis not present

## 2017-05-07 DIAGNOSIS — Z7984 Long term (current) use of oral hypoglycemic drugs: Secondary | ICD-10-CM

## 2017-05-07 DIAGNOSIS — Z853 Personal history of malignant neoplasm of breast: Secondary | ICD-10-CM | POA: Diagnosis not present

## 2017-05-07 DIAGNOSIS — I2510 Atherosclerotic heart disease of native coronary artery without angina pectoris: Secondary | ICD-10-CM | POA: Diagnosis not present

## 2017-05-07 DIAGNOSIS — Z170 Estrogen receptor positive status [ER+]: Secondary | ICD-10-CM

## 2017-05-07 DIAGNOSIS — I493 Ventricular premature depolarization: Secondary | ICD-10-CM | POA: Diagnosis not present

## 2017-05-07 DIAGNOSIS — I130 Hypertensive heart and chronic kidney disease with heart failure and stage 1 through stage 4 chronic kidney disease, or unspecified chronic kidney disease: Secondary | ICD-10-CM | POA: Diagnosis present

## 2017-05-07 DIAGNOSIS — Z87891 Personal history of nicotine dependence: Secondary | ICD-10-CM

## 2017-05-07 DIAGNOSIS — Z9010 Acquired absence of unspecified breast and nipple: Secondary | ICD-10-CM

## 2017-05-07 DIAGNOSIS — R000 Tachycardia, unspecified: Secondary | ICD-10-CM | POA: Diagnosis not present

## 2017-05-07 DIAGNOSIS — E1122 Type 2 diabetes mellitus with diabetic chronic kidney disease: Secondary | ICD-10-CM | POA: Diagnosis not present

## 2017-05-07 DIAGNOSIS — I340 Nonrheumatic mitral (valve) insufficiency: Secondary | ICD-10-CM | POA: Diagnosis not present

## 2017-05-07 DIAGNOSIS — R05 Cough: Secondary | ICD-10-CM | POA: Diagnosis not present

## 2017-05-07 DIAGNOSIS — C50411 Malignant neoplasm of upper-outer quadrant of right female breast: Secondary | ICD-10-CM

## 2017-05-07 DIAGNOSIS — N183 Chronic kidney disease, stage 3 unspecified: Secondary | ICD-10-CM | POA: Diagnosis present

## 2017-05-07 DIAGNOSIS — Z6837 Body mass index (BMI) 37.0-37.9, adult: Secondary | ICD-10-CM | POA: Diagnosis not present

## 2017-05-07 DIAGNOSIS — I361 Nonrheumatic tricuspid (valve) insufficiency: Secondary | ICD-10-CM | POA: Diagnosis not present

## 2017-05-07 DIAGNOSIS — I10 Essential (primary) hypertension: Secondary | ICD-10-CM | POA: Diagnosis present

## 2017-05-07 DIAGNOSIS — K219 Gastro-esophageal reflux disease without esophagitis: Secondary | ICD-10-CM | POA: Diagnosis present

## 2017-05-07 DIAGNOSIS — Z23 Encounter for immunization: Secondary | ICD-10-CM

## 2017-05-07 DIAGNOSIS — R0602 Shortness of breath: Secondary | ICD-10-CM

## 2017-05-07 DIAGNOSIS — I081 Rheumatic disorders of both mitral and tricuspid valves: Secondary | ICD-10-CM | POA: Diagnosis not present

## 2017-05-07 DIAGNOSIS — Z79811 Long term (current) use of aromatase inhibitors: Secondary | ICD-10-CM | POA: Diagnosis not present

## 2017-05-07 DIAGNOSIS — Z79899 Other long term (current) drug therapy: Secondary | ICD-10-CM | POA: Diagnosis not present

## 2017-05-07 DIAGNOSIS — I351 Nonrheumatic aortic (valve) insufficiency: Secondary | ICD-10-CM | POA: Diagnosis not present

## 2017-05-07 DIAGNOSIS — E1129 Type 2 diabetes mellitus with other diabetic kidney complication: Secondary | ICD-10-CM | POA: Diagnosis present

## 2017-05-07 DIAGNOSIS — E876 Hypokalemia: Secondary | ICD-10-CM | POA: Diagnosis not present

## 2017-05-07 DIAGNOSIS — I252 Old myocardial infarction: Secondary | ICD-10-CM | POA: Diagnosis not present

## 2017-05-07 DIAGNOSIS — R9439 Abnormal result of other cardiovascular function study: Secondary | ICD-10-CM | POA: Clinically undetermined

## 2017-05-07 DIAGNOSIS — Z7901 Long term (current) use of anticoagulants: Secondary | ICD-10-CM | POA: Diagnosis not present

## 2017-05-07 DIAGNOSIS — R0989 Other specified symptoms and signs involving the circulatory and respiratory systems: Secondary | ICD-10-CM

## 2017-05-07 DIAGNOSIS — I160 Hypertensive urgency: Secondary | ICD-10-CM

## 2017-05-07 DIAGNOSIS — I4891 Unspecified atrial fibrillation: Secondary | ICD-10-CM | POA: Diagnosis present

## 2017-05-07 DIAGNOSIS — I5033 Acute on chronic diastolic (congestive) heart failure: Secondary | ICD-10-CM | POA: Diagnosis not present

## 2017-05-07 DIAGNOSIS — E669 Obesity, unspecified: Secondary | ICD-10-CM | POA: Diagnosis present

## 2017-05-07 DIAGNOSIS — I481 Persistent atrial fibrillation: Principal | ICD-10-CM | POA: Diagnosis present

## 2017-05-07 DIAGNOSIS — J811 Chronic pulmonary edema: Secondary | ICD-10-CM | POA: Diagnosis not present

## 2017-05-07 DIAGNOSIS — R079 Chest pain, unspecified: Secondary | ICD-10-CM | POA: Diagnosis not present

## 2017-05-07 HISTORY — DX: Unspecified atrial fibrillation: I48.91

## 2017-05-07 HISTORY — DX: Dyspnea, unspecified: R06.00

## 2017-05-07 LAB — BASIC METABOLIC PANEL
ANION GAP: 17 — AB (ref 5–15)
BUN: 8 mg/dL (ref 6–20)
CHLORIDE: 99 mmol/L — AB (ref 101–111)
CO2: 20 mmol/L — AB (ref 22–32)
Calcium: 9.7 mg/dL (ref 8.9–10.3)
Creatinine, Ser: 1.07 mg/dL — ABNORMAL HIGH (ref 0.44–1.00)
GFR calc Af Amer: 57 mL/min — ABNORMAL LOW (ref >60)
GFR calc non Af Amer: 49 mL/min — ABNORMAL LOW (ref >60)
GLUCOSE: 127 mg/dL — AB (ref 65–99)
POTASSIUM: 3.5 mmol/L (ref 3.5–5.1)
Sodium: 136 mmol/L (ref 135–145)

## 2017-05-07 LAB — I-STAT TROPONIN, ED: Troponin i, poc: 0.04 ng/mL (ref 0.00–0.08)

## 2017-05-07 LAB — CBC
HEMATOCRIT: 41.7 % (ref 36.0–46.0)
HEMOGLOBIN: 13.9 g/dL (ref 12.0–15.0)
MCH: 31 pg (ref 26.0–34.0)
MCHC: 33.3 g/dL (ref 30.0–36.0)
MCV: 93.1 fL (ref 78.0–100.0)
PLATELETS: 206 10*3/uL (ref 150–400)
RBC: 4.48 MIL/uL (ref 3.87–5.11)
RDW: 13.9 % (ref 11.5–15.5)
WBC: 7.1 10*3/uL (ref 4.0–10.5)

## 2017-05-07 LAB — BRAIN NATRIURETIC PEPTIDE: B Natriuretic Peptide: 405 pg/mL — ABNORMAL HIGH (ref 0.0–100.0)

## 2017-05-07 MED ORDER — ALPRAZOLAM 0.25 MG PO TABS
0.2500 mg | ORAL_TABLET | Freq: Two times a day (BID) | ORAL | Status: DC | PRN
  Administered 2017-05-12 – 2017-05-13 (×2): 0.25 mg via ORAL
  Filled 2017-05-07 (×2): qty 1

## 2017-05-07 MED ORDER — MECLIZINE HCL 12.5 MG PO TABS: 12.5000 mg | ORAL_TABLET | Freq: Three times a day (TID) | ORAL | Status: DC | PRN

## 2017-05-07 MED ORDER — INSULIN ASPART 100 UNIT/ML ~~LOC~~ SOLN: 0.0000 [IU] | Freq: Every day | SUBCUTANEOUS | Status: DC

## 2017-05-07 MED ORDER — ROSUVASTATIN CALCIUM 10 MG PO TABS
10.0000 mg | ORAL_TABLET | Freq: Every day | ORAL | Status: DC
  Administered 2017-05-08 – 2017-05-15 (×8): 10 mg via ORAL
  Filled 2017-05-07 (×8): qty 1

## 2017-05-07 MED ORDER — AMLODIPINE BESYLATE 5 MG PO TABS
5.0000 mg | ORAL_TABLET | Freq: Every day | ORAL | Status: DC
  Administered 2017-05-08 (×2): 5 mg via ORAL
  Filled 2017-05-07 (×2): qty 1

## 2017-05-07 MED ORDER — PANTOPRAZOLE SODIUM 40 MG PO TBEC
40.0000 mg | DELAYED_RELEASE_TABLET | Freq: Every day | ORAL | Status: DC
  Administered 2017-05-08 – 2017-05-16 (×9): 40 mg via ORAL
  Filled 2017-05-07 (×9): qty 1

## 2017-05-07 MED ORDER — ACETAMINOPHEN 325 MG PO TABS
650.0000 mg | ORAL_TABLET | ORAL | Status: DC | PRN
  Filled 2017-05-07: qty 2

## 2017-05-07 MED ORDER — POTASSIUM CHLORIDE CRYS ER 20 MEQ PO TBCR
20.0000 meq | EXTENDED_RELEASE_TABLET | Freq: Every day | ORAL | Status: DC
  Administered 2017-05-08 – 2017-05-16 (×8): 20 meq via ORAL
  Filled 2017-05-07 (×9): qty 1

## 2017-05-07 MED ORDER — ANASTROZOLE 1 MG PO TABS
1.0000 mg | ORAL_TABLET | Freq: Every day | ORAL | Status: DC
  Administered 2017-05-08 – 2017-05-16 (×9): 1 mg via ORAL
  Filled 2017-05-07 (×9): qty 1

## 2017-05-07 MED ORDER — DILTIAZEM HCL-DEXTROSE 100-5 MG/100ML-% IV SOLN (PREMIX)
5.0000 mg/h | INTRAVENOUS | Status: DC
  Administered 2017-05-07: 5 mg/h via INTRAVENOUS
  Filled 2017-05-07: qty 100

## 2017-05-07 MED ORDER — TRAMADOL HCL 50 MG PO TABS
50.0000 mg | ORAL_TABLET | Freq: Four times a day (QID) | ORAL | Status: DC | PRN
  Administered 2017-05-12 (×2): 50 mg via ORAL
  Administered 2017-05-15: 100 mg via ORAL
  Filled 2017-05-07: qty 2
  Filled 2017-05-07 (×2): qty 1

## 2017-05-07 MED ORDER — DILTIAZEM LOAD VIA INFUSION
10.0000 mg | Freq: Once | INTRAVENOUS | Status: AC
  Administered 2017-05-07: 10 mg via INTRAVENOUS
  Filled 2017-05-07: qty 10

## 2017-05-07 MED ORDER — INSULIN ASPART 100 UNIT/ML ~~LOC~~ SOLN
0.0000 [IU] | Freq: Three times a day (TID) | SUBCUTANEOUS | Status: DC
  Administered 2017-05-08 – 2017-05-10 (×6): 1 [IU] via SUBCUTANEOUS
  Administered 2017-05-10: 2 [IU] via SUBCUTANEOUS
  Administered 2017-05-11 – 2017-05-16 (×8): 1 [IU] via SUBCUTANEOUS

## 2017-05-07 MED ORDER — CLONIDINE HCL 0.1 MG PO TABS
0.3000 mg | ORAL_TABLET | Freq: Two times a day (BID) | ORAL | Status: DC
  Administered 2017-05-08 (×2): 0.3 mg via ORAL
  Filled 2017-05-07 (×2): qty 3

## 2017-05-07 MED ORDER — ONDANSETRON HCL 4 MG/2ML IJ SOLN
4.0000 mg | Freq: Four times a day (QID) | INTRAMUSCULAR | Status: DC | PRN
  Filled 2017-05-07: qty 2

## 2017-05-07 MED ORDER — APIXABAN 5 MG PO TABS
5.0000 mg | ORAL_TABLET | Freq: Two times a day (BID) | ORAL | Status: DC
  Administered 2017-05-08 – 2017-05-10 (×6): 5 mg via ORAL
  Filled 2017-05-07 (×7): qty 1

## 2017-05-07 MED ORDER — FUROSEMIDE 10 MG/ML IJ SOLN
20.0000 mg | Freq: Two times a day (BID) | INTRAMUSCULAR | Status: DC
  Administered 2017-05-08 (×3): 20 mg via INTRAVENOUS
  Filled 2017-05-07 (×3): qty 2

## 2017-05-07 MED ORDER — CARVEDILOL 3.125 MG PO TABS: 3.1250 mg | ORAL_TABLET | Freq: Two times a day (BID) | ORAL | Status: DC

## 2017-05-07 MED ORDER — ZOLPIDEM TARTRATE 5 MG PO TABS
5.0000 mg | ORAL_TABLET | Freq: Every evening | ORAL | Status: DC | PRN
  Administered 2017-05-13: 5 mg via ORAL
  Filled 2017-05-07: qty 1

## 2017-05-07 MED ORDER — GABAPENTIN 300 MG PO CAPS
300.0000 mg | ORAL_CAPSULE | Freq: Every day | ORAL | Status: DC
  Administered 2017-05-08 – 2017-05-15 (×9): 300 mg via ORAL
  Filled 2017-05-07 (×9): qty 1

## 2017-05-07 NOTE — ED Triage Notes (Addendum)
Pt arrived from home c/o chest fluttering x 3 days with fatigue and sob on ambulation and pedal edema. Pain is intermittent. Reports decreased PO appetite for the past day and has not taken her medications. Pt noted to be in afib at a rate of 130-160. Denies hx of afib; EMS gave 324mg  ASA pta

## 2017-05-07 NOTE — ED Provider Notes (Signed)
[facility] EMERGENCY DEPARTMENT Provider Note   CSN: [id number] Arrival date & time: 05/07/17  2139     History   Chief Complaint Chief Complaint  Patient presents with  . Atrial Fibrillation  . Chest Pain    HPI Carrie Key is a 77 y.o. female.  The history is provided by the patient and medical records.  Atrial Fibrillation  Associated symptoms include chest pain and shortness of breath.  Chest Pain   Associated symptoms include palpitations and shortness of breath.     77 y.o. F with hx of AFIB, breast cancer, CKD, DM, HLP, HTN, presenting to the ED with palpitations for the past 3 days.  She reports she has been feeling very fatigued, lightheaded, and SOB, especially with any type of activity.  She has had increased pedal edema.  She is unsure about weight gain.  States this happened to her a few years ago and they told her she was in AFIB.  She was on eliquis previously states her cardiologist gave her 2 packs but did not say anything  about her continuing it after those were done so she never went back or got a refill.  She was followed by Dr. Stanford Breed but has not been seen in clinic in almost a year.  Past Medical History:  Diagnosis Date  . Atrial fibrillation ([place])   . Breast cancer ([place])   . Cancer ([place])   . CKD (chronic kidney disease), stage III ([place]) 07/10/2014  . Diabetes mellitus without complication ([place])   . GERD (gastroesophageal reflux disease)   . Heart murmur   . Hyperlipemia   . Hypertension   . Wears glasses     Patient Active Problem List   Diagnosis Date Noted  . Atrial fibrillation ([place]) 04/26/2016  . CKD (chronic kidney disease), stage III ([place]) 07/10/2014  . Insomnia w/ sleep apnea 09/12/2013  . Snoring 09/12/2013  . Severe obesity (BMI >= 40) ([place]) 09/12/2013  . Neuropathic pain 01/07/2013  . Malignant neoplasm of upper-outer quadrant of right breast in female, estrogen receptor positive ([place]) 11/05/2012  .  Hyperlipidemia 04/24/2009  . HYPERTENSION, BENIGN 04/24/2009    Past Surgical History:  Procedure Laterality Date  . ABDOMINAL HYSTERECTOMY    . BACK SURGERY  2002   lumbar lam  . CARDIAC CATHETERIZATION  2003  . COLONOSCOPY    . SIMPLE MASTECTOMY WITH AXILLARY SENTINEL NODE BIOPSY Right 12/03/2012   Procedure: RIGHT SIMPLE MASTECTOMY WITH RIGHT  AXILLARY SENTINEL LYMPH NODE BIOPSY;  Surgeon: Shann Medal, MD;  Location: [facility];  Service: General;  Laterality: Right;  . UPPER GI ENDOSCOPY       OB History   None      Home Medications    Prior to Admission medications   Medication Sig Start Date End Date Taking? Authorizing Provider  amLODipine (NORVASC) 5 MG tablet Take 1 tablet (5 mg total) by mouth daily. 05/27/16 05/07/17 Yes Almyra Deforest, PA  apixaban (ELIQUIS) 5 MG TABS tablet Take 1 tablet (5 mg total) by mouth 2 (two) times daily. 04/27/16  Yes Duke, Tami Lin, PA  cloNIDine (CATAPRES) 0.3 MG tablet Take 1 tablet (0.3 mg total) by mouth 2 (two) times daily. MUST GET REFILLS WITH PCP Patient taking differently: Take 0.3 mg by mouth at bedtime. MUST GET REFILLS WITH PCP 05/27/16  Yes Almyra Deforest, PA  anastrozole (ARIMIDEX) 1 MG tablet Take 1 tablet (1 mg total) by mouth daily. 02/20/17   Magrinat,  Virgie Dad, MD  carvedilol (COREG) 3.125 MG tablet Take 1 tablet (3.125 mg total) by mouth 2 (two) times daily. Patient not taking: Reported on 08/19/2016 05/27/16 08/25/16  Almyra Deforest, PA  furosemide (LASIX) 20 MG tablet Take 1 tablet (20 mg total) by mouth daily. Patient not taking: Reported on 05/07/2017 04/28/16   Ledora Bottcher, PA  gabapentin (NEURONTIN) 300 MG capsule Take 1 capsule (300 mg total) by mouth at bedtime. 02/20/17   Magrinat, Virgie Dad, MD  glimepiride (AMARYL) 1 MG tablet Take 1 mg by mouth daily with breakfast.    [provider]  ibuprofen (ADVIL,MOTRIN) 200 MG tablet Take 400 mg by mouth every 6 (six) hours as needed for mild pain or  moderate pain.    [provider]  meclizine (ANTIVERT) 12.5 MG tablet Take 1 tablet (12.5 mg total) by mouth 3 (three) times daily as needed for dizziness. 06/10/16   Law, Bea Graff, PA-C  omeprazole (PRILOSEC) 40 MG capsule Take 40 mg by mouth daily.     Buccini, Robert, MD  pioglitazone (ACTOS) 45 MG tablet Take 45 mg by mouth daily.    [provider]  potassium chloride SA (K-DUR,KLOR-CON) 20 MEQ tablet Take 1 tablet (20 mEq total) by mouth daily. 04/27/16   Duke, Tami Lin, PA  rosuvastatin (CRESTOR) 10 MG tablet Take 10 mg by mouth daily.    Willey Blade, MD  traMADol (ULTRAM) 50 MG tablet Take 1 tablet (50 mg total) by mouth every 6 (six) hours as needed. for pain 02/20/17   Magrinat, Virgie Dad, MD    Family History Family History  Problem Relation Age of Onset  . Hypertension Mother   . Stroke Mother   . Hypertension Father   . Heart attack Father   . Hypertension Paternal Grandmother     Social History Social History   Tobacco Use  . Smoking status: Former Smoker    Packs/day: 0.30    Years: 30.00    Pack years: 9.00    Types: Cigarettes    Last attempt to quit: 11/26/1988    Years since quitting: 28.4  . Smokeless tobacco: Never Used  Substance Use Topics  . Alcohol use: Yes    Alcohol/week: 2.4 oz    Types: 4 Glasses of wine per week    Comment: occ  . Drug use: No     Allergies   Patient has no known allergies.   Review of Systems Review of Systems  Respiratory: Positive for shortness of breath.   Cardiovascular: Positive for chest pain, palpitations and leg swelling.  Neurological: Positive for light-headedness.  All other systems reviewed and are negative.    Physical Exam Updated Vital Signs BP (!) 164/121 (BP Location: Right Arm)   Pulse (!) 130   Temp 99 F (37.2 C) (Oral)   Resp 18   SpO2 100%   Physical Exam  Constitutional: She is oriented to person, place, and time. She appears well-developed and  well-nourished.  HENT:  Head: Normocephalic and atraumatic.  Mouth/Throat: Oropharynx is clear and moist.  Eyes: Pupils are equal, round, and reactive to light. Conjunctivae and EOM are normal.  Neck: Normal range of motion.  Cardiovascular: Normal heart sounds. An irregularly irregular rhythm present. Tachycardia present.  AFIB RVR with rate 130's-140's during exam  Pulmonary/Chest: Effort normal and breath sounds normal.  Abdominal: Soft. Bowel sounds are normal.  Musculoskeletal: Normal range of motion.  2+ pedal edema  Neurological: She is alert and oriented to  person, place, and time.  Skin: Skin is warm and dry.  Psychiatric: She has a normal mood and affect.  Nursing note and vitals reviewed.    ED Treatments / Results  Labs (all labs ordered are listed, but only abnormal results are displayed) Labs Reviewed  BASIC METABOLIC PANEL - Abnormal; Notable for the following components:      Result Value   Chloride 99 (*)    CO2 20 (*)    Glucose, Bld 127 (*)    Creatinine, Ser 1.07 (*)    GFR calc non Af Amer 49 (*)    GFR calc Af Amer 57 (*)    Anion gap 17 (*)    All other components within normal limits  BRAIN NATRIURETIC PEPTIDE - Abnormal; Notable for the following components:   B Natriuretic Peptide 405.0 (*)    All other components within normal limits  CBC  I-STAT TROPONIN, ED  CBG MONITORING, ED    EKG EKG Interpretation  Date/Time:  Sunday May 07 2017 22:24:19 EDT Ventricular Rate:  120 PR Interval:    QRS Duration: 96 QT Interval:  343 QTC Calculation: 485 R Axis:   16 Text Interpretation:  Atrial fibrillation Minimal ST depression, anterolateral leads Confirmed by Tanna Furry [phone]) on 05/07/2017 10:39:29 PM   Radiology Dg Chest 2 View  Result Date: 05/07/2017 CLINICAL DATA:  Chest pain EXAM: CHEST - 2 VIEW COMPARISON:  03/27/2017 chest radiograph. FINDINGS: Mild to moderate enlargement of the cardiopericardial silhouette, increased in the  interval. Otherwise normal mediastinal contour. No pneumothorax. No pleural effusions. No overt pulmonary edema. No acute consolidative airspace disease. IMPRESSION: Mild-to-moderate enlargement of the cardiopericardial silhouette, increased in the interval. No overt pulmonary edema. No active pulmonary disease. Electronically Signed   By: Ilona Sorrel M.D.   On: 05/07/2017 22:47    Procedures Procedures (including critical care time)  CRITICAL CARE Performed by: Larene Pickett   Total critical care time: 40 minutes  Critical care time was exclusive of separately billable procedures and treating other patients.  Critical care was necessary to treat or prevent imminent or life-threatening deterioration.  Critical care was time spent personally by me on the following activities: development of treatment plan with patient and/or surrogate as well as nursing, discussions with consultants, evaluation of patient's response to treatment, examination of patient, obtaining history from patient or surrogate, ordering and performing treatments and interventions, ordering and review of laboratory studies, ordering and review of radiographic studies, pulse oximetry and re-evaluation of patient's condition.   Medications Ordered in ED Medications  diltiazem (CARDIZEM) 1 mg/mL load via infusion 10 mg (has no administration in time range)    And  diltiazem (CARDIZEM) 100 mg in dextrose 5% 119mL (1 mg/mL) infusion (has no administration in time range)     Initial Impression / Assessment and Plan / ED Course  I have reviewed the triage vital signs and the nursing notes.  Pertinent labs & imaging results that were available during my care of the patient were reviewed by me and considered in my medical decision making (see chart for details).  77 year old female presenting to the ED in A. fib with RVR.  Has had palpitations, lightheadedness, shortness of breath for the past 3 days.  She is no longer on  her Eliquis as she has not had any recent follow-up with her cardiologist in nearly a year.  Rate 130s-140s during exam.  Her chadsvasc score is 8 placing her at high risk.  Given symptoms  have been ongoing for 3 days and she is no longer anticoagulated, she is not a candidate for electrocardioversion.  Will give Cardizem bolus and start on drip.  She will require admission.  Labs and chest x-ray pending.  11:30 PM Labs reassuring.  CXR with some cardiac enlargement.  After caridzem bolus and initiation of drip patient remains in AFIB but rate has slowed to low 100's.  Patient has felt some improvement.  BP also improved.  Will consult hospitalist for admission.  Spoke with Dr. Myna Hidalgo-- he will admit for ongoing care.  Final Clinical Impressions(s) / ED Diagnoses   Final diagnoses:  Atrial fibrillation with RVR [facility])  Essential hypertension    ED Discharge Orders    None       Kathryne Hitch 05/07/17 2347    Tanna Furry, MD 05/08/17 [phone]

## 2017-05-07 NOTE — H&P (Signed)
History and Physical    Carrie Key WUJ:[id number] DOB: Dec 28, 1940 DOA: 05/07/2017  PCP: Glendale Chard, MD   Patient coming from: Home  Chief Complaint: Palpitations, DOE, increased leg swelling bilaterally   HPI: Carrie Key is a 77 y.o. female with medical history significant for atrial fibrillation on Eliquis, cancer of the right breast status post definitive resection, chronic kidney disease stage III, chronic diastolic CHF, hypertension, and non-insulin-dependent diabetes mellitus, now presenting to the emergency department with 3 days of palpitations, exertional dyspnea, and increased leg swelling bilaterally.  Reports that she had been in her usual state of health until approximately 3 days ago when she felt a fluttering in her chest.  She has had this sensation, waxing and waning, over the past 3 days, and also reports exertional dyspnea and progressive swelling of the bilateral lower extremities over the same interval.  She denies chest pain.  She denies fevers or chills.  She reports an occasional nonproductive cough.  Reports that she had run out of her Eliquis and has not taken it recently.  ED Course: Upon arrival to the ED, patient is found to be afebrile, saturating adequately on room air, slightly tachypneic, tachycardic in the 130s, and hypertensive to 150/115.  EKG features atrial fibrillation with RVR, rate 133.  Chest x-ray is notable for mild to moderate enlargement of the cardiopericardial silhouette, increased since the prior study, but without overt edema.  Chemistry panel is notable for a creatinine of 1.07, lower than any other recent priors.  CBC is unremarkable.  Troponin is normal and BNP is elevated to 405.  Patient was given 10 mg IV diltiazem and started on diltiazem infusion.  Heart rate has improved with this, she continues to be free of chest pain, continues to be hypertensive, and will be admitted to the stepdown unit for ongoing evaluation and management of  atrial fibrillation with RVR and acute on chronic diastolic CHF.  Review of Systems:  All other systems reviewed and apart from HPI, are negative.  Past Medical History:  Diagnosis Date  . Atrial fibrillation ([place])   . Breast cancer ([place])   . Cancer ([place])   . CKD (chronic kidney disease), stage III ([place]) 07/10/2014  . Diabetes mellitus without complication ([place])   . GERD (gastroesophageal reflux disease)   . Heart murmur   . Hyperlipemia   . Hypertension   . Wears glasses     Past Surgical History:  Procedure Laterality Date  . ABDOMINAL HYSTERECTOMY    . BACK SURGERY  2002   lumbar lam  . CARDIAC CATHETERIZATION  2003  . COLONOSCOPY    . SIMPLE MASTECTOMY WITH AXILLARY SENTINEL NODE BIOPSY Right 12/03/2012   Procedure: RIGHT SIMPLE MASTECTOMY WITH RIGHT  AXILLARY SENTINEL LYMPH NODE BIOPSY;  Surgeon: Shann Medal, MD;  Location: [facility];  Service: General;  Laterality: Right;  . UPPER GI ENDOSCOPY       reports that she quit smoking about 28 years ago. Her smoking use included cigarettes. She has a 9.00 pack-year smoking history. She has never used smokeless tobacco. She reports that she drinks about 2.4 oz of alcohol per week. She reports that she does not use drugs.  No Known Allergies  Family History  Problem Relation Age of Onset  . Hypertension Mother   . Stroke Mother   . Hypertension Father   . Heart attack Father   . Hypertension Paternal Grandmother      Prior to Admission medications  Medication Sig Start Date End Date Taking? Authorizing Provider  amLODipine (NORVASC) 5 MG tablet Take 1 tablet (5 mg total) by mouth daily. 05/27/16 05/07/17 Yes Almyra Deforest, PA  apixaban (ELIQUIS) 5 MG TABS tablet Take 1 tablet (5 mg total) by mouth 2 (two) times daily. 04/27/16  Yes Duke, Tami Lin, PA  cloNIDine (CATAPRES) 0.3 MG tablet Take 1 tablet (0.3 mg total) by mouth 2 (two) times daily. MUST GET REFILLS WITH PCP Patient taking differently: Take  0.3 mg by mouth at bedtime. MUST GET REFILLS WITH PCP 05/27/16  Yes Almyra Deforest, PA  anastrozole (ARIMIDEX) 1 MG tablet Take 1 tablet (1 mg total) by mouth daily. 02/20/17   Magrinat, Virgie Dad, MD  carvedilol (COREG) 3.125 MG tablet Take 1 tablet (3.125 mg total) by mouth 2 (two) times daily. Patient not taking: Reported on 08/19/2016 05/27/16 08/25/16  Almyra Deforest, PA  furosemide (LASIX) 20 MG tablet Take 1 tablet (20 mg total) by mouth daily. Patient not taking: Reported on 05/07/2017 04/28/16   Ledora Bottcher, PA  gabapentin (NEURONTIN) 300 MG capsule Take 1 capsule (300 mg total) by mouth at bedtime. 02/20/17   Magrinat, Virgie Dad, MD  glimepiride (AMARYL) 1 MG tablet Take 1 mg by mouth daily with breakfast.    [provider]  ibuprofen (ADVIL,MOTRIN) 200 MG tablet Take 400 mg by mouth every 6 (six) hours as needed for mild pain or moderate pain.    [provider]  meclizine (ANTIVERT) 12.5 MG tablet Take 1 tablet (12.5 mg total) by mouth 3 (three) times daily as needed for dizziness. 06/10/16   Law, Bea Graff, PA-C  omeprazole (PRILOSEC) 40 MG capsule Take 40 mg by mouth daily.     Buccini, Robert, MD  pioglitazone (ACTOS) 45 MG tablet Take 45 mg by mouth daily.    [provider]  potassium chloride SA (K-DUR,KLOR-CON) 20 MEQ tablet Take 1 tablet (20 mEq total) by mouth daily. 04/27/16   Duke, Tami Lin, PA  rosuvastatin (CRESTOR) 10 MG tablet Take 10 mg by mouth daily.    Willey Blade, MD  traMADol (ULTRAM) 50 MG tablet Take 1 tablet (50 mg total) by mouth every 6 (six) hours as needed. for pain 02/20/17   Chauncey Cruel, MD    Physical Exam: Vitals:   05/07/17 2150 05/07/17 2230 05/07/17 2300 05/07/17 2333  BP: (!) 164/121 (!) 172/135 (!) 153/117   Pulse: (!) 130 70 (!) 115   Resp: 18 (!) 21 (!) 21   Temp: 99 F (37.2 C)   (S) 99.5 F (37.5 C)  TempSrc: Oral   Rectal  SpO2: 100% 95% 95%       Constitutional: NAD, calm  Eyes: PERTLA, lids and  conjunctivae normal ENMT: Mucous membranes are moist. Posterior pharynx clear of any exudate or lesions.   Neck: normal, supple, no masses, no thyromegaly Respiratory: Mild dyspnea with speech, no wheezing, no crackles. Normal respiratory effort.    Cardiovascular: Rate ~120 and irregular. 2+ pretibial edema bilaterally. Abdomen: No distension, no tenderness, soft. Bowel sounds normal.  Musculoskeletal: no clubbing / cyanosis. No joint deformity upper and lower extremities.    Skin: no significant rashes, lesions, ulcers. Warm, dry, well-perfused. Neurologic: CN 2-12 grossly intact. Sensation intact. Strength 5/5 in all 4 limbs.  Psychiatric: Alert and oriented x 3. Calm, cooperative.     Labs on Admission: I have personally reviewed following labs and imaging studies  CBC: Recent Labs  Lab 05/07/17 2202  WBC  7.1  HGB 13.9  HCT 41.7  MCV 93.1  PLT 765   Basic Metabolic Panel: Recent Labs  Lab 05/07/17 2202  NA 136  K 3.5  CL 99*  CO2 20*  GLUCOSE 127*  BUN 8  CREATININE 1.07*  CALCIUM 9.7   GFR: CrCl cannot be calculated (Unknown ideal weight.). Liver Function Tests: No results for input(s): AST, ALT, ALKPHOS, BILITOT, PROT, ALBUMIN in the last 168 hours. No results for input(s): LIPASE, AMYLASE in the last 168 hours. No results for input(s): AMMONIA in the last 168 hours. Coagulation Profile: No results for input(s): INR, PROTIME in the last 168 hours. Cardiac Enzymes: No results for input(s): CKTOTAL, CKMB, CKMBINDEX, TROPONINI in the last 168 hours. BNP (last 3 results) No results for input(s): PROBNP in the last 8760 hours. HbA1C: No results for input(s): HGBA1C in the last 72 hours. CBG: No results for input(s): GLUCAP in the last 168 hours. Lipid Profile: No results for input(s): CHOL, HDL, LDLCALC, TRIG, CHOLHDL, LDLDIRECT in the last 72 hours. Thyroid Function Tests: No results for input(s): TSH, T4TOTAL, FREET4, T3FREE, THYROIDAB in the last 72  hours. Anemia Panel: No results for input(s): VITAMINB12, FOLATE, FERRITIN, TIBC, IRON, RETICCTPCT in the last 72 hours. Urine analysis:    Component Value Date/Time   COLORURINE YELLOW 06/21/2014 0946   APPEARANCEUR CLEAR 06/21/2014 0946   LABSPEC 1.020 02/18/2016 1154   PHURINE 6.0 02/18/2016 1154   PHURINE 5.0 06/21/2014 0946   GLUCOSEU Negative 02/18/2016 1154   HGBUR Negative 02/18/2016 1154   HGBUR NEGATIVE 06/21/2014 0946   BILIRUBINUR Negative 02/18/2016 1154   KETONESUR Negative 02/18/2016 1154   KETONESUR NEGATIVE 06/21/2014 0946   PROTEINUR Negative 02/18/2016 1154   PROTEINUR NEGATIVE 06/21/2014 0946   UROBILINOGEN 0.2 02/18/2016 1154   NITRITE Negative 02/18/2016 1154   NITRITE NEGATIVE 06/21/2014 0946   LEUKOCYTESUR Moderate 02/18/2016 1154   Sepsis Labs: @LABRCNTIP (procalcitonin:4,lacticidven:4) )No results found for this or any previous visit (from the past 240 hour(s)).   Radiological Exams on Admission: Dg Chest 2 View  Result Date: 05/07/2017 CLINICAL DATA:  Chest pain EXAM: CHEST - 2 VIEW COMPARISON:  03/27/2017 chest radiograph. FINDINGS: Mild to moderate enlargement of the cardiopericardial silhouette, increased in the interval. Otherwise normal mediastinal contour. No pneumothorax. No pleural effusions. No overt pulmonary edema. No acute consolidative airspace disease. IMPRESSION: Mild-to-moderate enlargement of the cardiopericardial silhouette, increased in the interval. No overt pulmonary edema. No active pulmonary disease. Electronically Signed   By: Ilona Sorrel M.D.   On: 05/07/2017 22:47    EKG: Independently reviewed. Atrial fibrillation (rate 133), non-specific ST-abnormality.   Assessment/Plan   1. Atrial fibrillation with RVR  - Presents with 3 days of palpitations, increased leg swelling b/l, and DOE - Found to be in atrial fibrillation with rate in 130's  - Treated in ED with 10 mg IV diltiazem and started on diltiazem infusion  -  CHADS-VASc is at least 75 (age x2, gender, CHF, DM, HTN)  - She has not been taking her Eliquis recently, will resume  - Continue cardiac monitoring, continue diltiazem infusion with titration    2. Acute on chronic diastolic CHF  - Presents with 3 days of palpitations, DOE, and increased bilateral leg swelling  - Echo from one year ago with EF 55-60%, no WMA's, mild LAE, and mild pulmonary HTN  - This may be rate-related  - Diurese with Lasix 20 mg IV q12h adjusted prn, continue cardiac monitoring, SLIV, follow daily wt and I/O's,  continue Coreg, follow daily chem panel during diuresis, update echo    3. Type II DM  - A1c was only 5.8% one year ago  - Managed at home with pioglitazone and glimepiride, held on admission  - Check CBG's and use a low-intensity SSI as needed while in hospital    4. CKD stage III  - SCr is 1.07 on admission, lower than priors  - Renally-dose medications, follow daily chem panel during diuresis    5. Right breast cancer  - Status-post definitive surgery  - Continues to follow with oncology and no evidence for of disease recurrence per recent notes  - Continue Arimidex  6. Hypertension with hypertensive urgency  - BP 170/110 in ED  - Continue Coreg, Norvasc, and clonidine  - Use labetalol IVP's prn     DVT prophylaxis: Eliquis  Code Status: Full  Family Communication: Significant other updated at bedside Consults called: None Admission status: Inpatient    Vianne Bulls, MD [facility] Pager [phone]  If 7PM-7AM, please contact night-coverage [email or web address] Password [facility]  05/07/2017, 11:58 PM

## 2017-05-08 ENCOUNTER — Encounter (HOSPITAL_COMMUNITY): Payer: Self-pay | Admitting: General Practice

## 2017-05-08 ENCOUNTER — Inpatient Hospital Stay (HOSPITAL_COMMUNITY): Payer: Medicare HMO

## 2017-05-08 ENCOUNTER — Other Ambulatory Visit: Payer: Self-pay

## 2017-05-08 DIAGNOSIS — I340 Nonrheumatic mitral (valve) insufficiency: Secondary | ICD-10-CM

## 2017-05-08 DIAGNOSIS — I10 Essential (primary) hypertension: Secondary | ICD-10-CM | POA: Insufficient documentation

## 2017-05-08 DIAGNOSIS — I160 Hypertensive urgency: Secondary | ICD-10-CM | POA: Insufficient documentation

## 2017-05-08 DIAGNOSIS — I351 Nonrheumatic aortic (valve) insufficiency: Secondary | ICD-10-CM

## 2017-05-08 DIAGNOSIS — I4891 Unspecified atrial fibrillation: Secondary | ICD-10-CM

## 2017-05-08 DIAGNOSIS — I361 Nonrheumatic tricuspid (valve) insufficiency: Secondary | ICD-10-CM

## 2017-05-08 HISTORY — DX: Unspecified atrial fibrillation: I48.91

## 2017-05-08 LAB — GLUCOSE, CAPILLARY
GLUCOSE-CAPILLARY: 232 mg/dL — AB (ref 65–99)
Glucose-Capillary: 128 mg/dL — ABNORMAL HIGH (ref 65–99)
Glucose-Capillary: 146 mg/dL — ABNORMAL HIGH (ref 65–99)
Glucose-Capillary: 197 mg/dL — ABNORMAL HIGH (ref 65–99)

## 2017-05-08 LAB — BASIC METABOLIC PANEL
ANION GAP: 14 (ref 5–15)
BUN: 9 mg/dL (ref 6–20)
CHLORIDE: 99 mmol/L — AB (ref 101–111)
CO2: 24 mmol/L (ref 22–32)
Calcium: 9.4 mg/dL (ref 8.9–10.3)
Creatinine, Ser: 1.16 mg/dL — ABNORMAL HIGH (ref 0.44–1.00)
GFR calc non Af Amer: 45 mL/min — ABNORMAL LOW (ref >60)
GFR, EST AFRICAN AMERICAN: 52 mL/min — AB (ref >60)
Glucose, Bld: 137 mg/dL — ABNORMAL HIGH (ref 65–99)
POTASSIUM: 3.2 mmol/L — AB (ref 3.5–5.1)
Sodium: 137 mmol/L (ref 135–145)

## 2017-05-08 LAB — ECHOCARDIOGRAM COMPLETE
HEIGHTINCHES: 67 in
WEIGHTICAEL: 3622.4 [oz_av]

## 2017-05-08 LAB — MAGNESIUM: Magnesium: 1.5 mg/dL — ABNORMAL LOW (ref 1.7–2.4)

## 2017-05-08 LAB — TSH: TSH: 2.471 u[IU]/mL (ref 0.350–4.500)

## 2017-05-08 LAB — CBG MONITORING, ED: Glucose-Capillary: 138 mg/dL — ABNORMAL HIGH (ref 65–99)

## 2017-05-08 MED ORDER — MAGNESIUM SULFATE 50 % IJ SOLN
4.0000 g | Freq: Once | INTRAVENOUS | Status: AC
  Administered 2017-05-08: 4 g via INTRAVENOUS
  Filled 2017-05-08: qty 8

## 2017-05-08 MED ORDER — HYDRALAZINE HCL 20 MG/ML IJ SOLN
10.0000 mg | Freq: Four times a day (QID) | INTRAMUSCULAR | Status: DC | PRN
  Administered 2017-05-09 – 2017-05-12 (×3): 10 mg via INTRAVENOUS
  Filled 2017-05-08 (×2): qty 1

## 2017-05-08 MED ORDER — POTASSIUM CHLORIDE CRYS ER 20 MEQ PO TBCR
20.0000 meq | EXTENDED_RELEASE_TABLET | Freq: Once | ORAL | Status: AC
  Administered 2017-05-08: 20 meq via ORAL
  Filled 2017-05-08: qty 1

## 2017-05-08 MED ORDER — CARVEDILOL 3.125 MG PO TABS
3.1250 mg | ORAL_TABLET | Freq: Two times a day (BID) | ORAL | Status: DC
  Administered 2017-05-08 – 2017-05-10 (×6): 3.125 mg via ORAL
  Filled 2017-05-08 (×7): qty 1

## 2017-05-08 MED ORDER — LABETALOL HCL 5 MG/ML IV SOLN: 5.0000 mg | INTRAVENOUS | Status: DC | PRN

## 2017-05-08 MED ORDER — ENSURE ENLIVE PO LIQD
237.0000 mL | Freq: Two times a day (BID) | ORAL | Status: DC
  Administered 2017-05-08 – 2017-05-09 (×3): 237 mL via ORAL

## 2017-05-08 MED ORDER — PNEUMOCOCCAL VAC POLYVALENT 25 MCG/0.5ML IJ INJ
0.5000 mL | INJECTION | INTRAMUSCULAR | Status: AC
  Administered 2017-05-10: 0.5 mL via INTRAMUSCULAR
  Filled 2017-05-08: qty 0.5

## 2017-05-08 NOTE — Progress Notes (Signed)
  Echocardiogram 2D Echocardiogram has been performed.  Carrie Key 05/08/2017, 5:05 PM

## 2017-05-08 NOTE — Discharge Instructions (Addendum)
Information on my medicine - ELIQUIS (apixaban)  This medication education was reviewed with me or my healthcare representative as part of my discharge preparation.   Why was Eliquis prescribed for you? Eliquis was prescribed for you to reduce the risk of a blood clot forming that can cause a stroke if you have a medical condition called atrial fibrillation (a type of irregular heartbeat).  What do You need to know about Eliquis ? Take your Eliquis TWICE DAILY - one tablet in the morning and one tablet in the evening with or without food. If you have difficulty swallowing the tablet whole please discuss with your pharmacist how to take the medication safely.  Take Eliquis exactly as prescribed by your doctor and DO NOT stop taking Eliquis without talking to the doctor who prescribed the medication.  Stopping may increase your risk of developing a stroke.  Refill your prescription before you run out.  After discharge, you should have regular check-up appointments with your healthcare provider that is prescribing your Eliquis.  In the future your dose may need to be changed if your kidney function or weight changes by a significant amount or as you get older.  What do you do if you miss a dose? If you miss a dose, take it as soon as you remember on the same day and resume taking twice daily.  Do not take more than one dose of ELIQUIS at the same time to make up a missed dose.  Important Safety Information A possible side effect of Eliquis is bleeding. You should call your healthcare provider right away if you experience any of the following: ? Bleeding from an injury or your nose that does not stop. ? Unusual colored urine (red or dark brown) or unusual colored stools (red or black). ? Unusual bruising for unknown reasons. ? A serious fall or if you hit your head (even if there is no bleeding).  Some medicines may interact with Eliquis and might increase your risk of bleeding or  clotting while on Eliquis. To help avoid this, consult your healthcare provider or pharmacist prior to using any new prescription or non-prescription medications, including herbals, vitamins, non-steroidal anti-inflammatory drugs (NSAIDs) and supplements.  This website has more information on Eliquis (apixaban): http://www.eliquis.com/eliquis/home   Atrial Fibrillation Atrial fibrillation is a type of heartbeat that is irregular or fast (rapid). If you have this condition, your heart keeps quivering in a weird (chaotic) way. This condition can make it so your heart cannot pump blood normally. Having this condition gives a person more risk for stroke, heart failure, and other heart problems. There are different types of atrial fibrillation. Talk with your doctor to learn about the type that you have. Follow these instructions at home:  Take over-the-counter and prescription medicines only as told by your doctor.  If your doctor prescribed a blood-thinning medicine, take it exactly as told. Taking too much of it can cause bleeding. If you do not take enough of it, you will not have the protection that you need against stroke and other problems.  Do not use any tobacco products. These include cigarettes, chewing tobacco, and e-cigarettes. If you need help quitting, ask your doctor.  If you have apnea (obstructive sleep apnea), manage it as told by your doctor.  Do not drink alcohol.  Do not drink beverages that have caffeine. These include coffee, soda, and tea.  Maintain a healthy weight. Do not use diet pills unless your doctor says they are safe for  you. Diet pills may make heart problems worse.  Follow diet instructions as told by your doctor.  Exercise regularly as told by your doctor.  Keep all follow-up visits as told by your doctor. This is important. Contact a doctor if:  You notice a change in the speed, rhythm, or strength of your heartbeat.  You are taking a blood-thinning  medicine and you notice more bruising.  You get tired more easily when you move or exercise. Get help right away if:  You have pain in your chest or your belly (abdomen).  You have sweating or weakness.  You feel sick to your stomach (nauseous).  You notice blood in your throw up (vomit), poop (stool), or pee (urine).  You are short of breath.  You suddenly have swollen feet and ankles.  You feel dizzy.  Your suddenly get weak or numb in your face, arms, or legs, especially if it happens on one side of your body.  You have trouble talking, trouble understanding, or both.  Your face or your eyelid droops on one side. These symptoms may be an emergency. Do not wait to see if the symptoms will go away. Get medical help right away. Call your local emergency services (911 in the [place] Do not drive yourself to the hospital. This information is not intended to replace advice given to you by your health care provider. Make sure you discuss any questions you have with your health care provider. Document Released: 11/03/2007 Document Revised: 07/02/2015 Document Reviewed: 05/21/2014 Elsevier Interactive Patient Education  [employer].

## 2017-05-08 NOTE — ED Notes (Signed)
Attempted report x1. 

## 2017-05-08 NOTE — ED Notes (Signed)
Attempted report x 2 

## 2017-05-08 NOTE — Progress Notes (Addendum)
[facility]                                                                              Patient Demographics  Carrie Key, is a 77 y.o. female, DOB - 1940/10/01, [id number]  Admit date - 05/07/2017   Admitting Physician Carrie Bulls, MD  Outpatient Primary MD for the patient is Carrie Chard, MD  Outpatient specialists:   LOS - 1  days   Medical records reviewed and are as summarized below:    Chief Complaint  Patient presents with  . Atrial Fibrillation  . Chest Pain       Brief summary   Patient is a 77 year old female with A. fib on Eliquis, right breast CA status post resection, CKD stage III, chronic diastolic CHF, hypertension, diabetes mellitus presented with 3 days of palpitations, exertional dyspnea, increased leg swelling bilaterally.  In ED, patient was found to be tachycardiac heart rate in 130s, slightly tachypneic, BP 150/115.  EKG showed atrial for ablation with RVR.  Chest x-ray showed no pneumonia.  Patient was started on diltiazem infusion after bolus.  She was admitted to stepdown.    Assessment & Plan    Principal Problem: Chronic atrial fibrillation with RVR (HCC) -Presented with 3 days of palpitations, peripheral edema and dyspnea on exertion, found to be in rapid atrial fib with RVR -Diltiazem drip off since 4 AM, HR improved, continue Coreg 3.125 mg twice a day -She had similar admission in 04/2016, was placed on oral Cardizem, decreased to 120 mg daily on 05/06/16 by cardiology outpatient due to bradycardia.  Norvasc was discontinued at the time.  However patient apparently never picked up her prescription hence remained off cardizem.  Low-dose Coreg in 4/18 was added.  Patient did not follow-up with cardiology subsequently.   -BP currently stable but soft 111/82, continue oral beta-blocker, Lasix, clonidine.  -Continue eliquis Addendum 1:20pm Ordered by RN, patient had 2-second sinus pause on telemetry, ordered twelve-lead  EKG, DC clonidine.  Reviewed prior cardiology notes, patient had bradycardia with Cardizem. Currently she is off Cardizem drip, also discontinue labetalol, norvasc -Will review EKG to assess if she has any heart block - Replace potassium, follow magnesium   Active Problems:   Acute on chronic diastolic CHF (congestive heart failure) (HCC) -Presented with 3 days of palpitations, dyspnea on exertion with increased bilateral leg swelling.  BNP 405 -Repeat 2D echo, continue Lasix, 20 mg IV q12hrs, Coreg -DC amlodipine, continue strict I's and O's and daily weights    HYPERTENSION, BENIGN with hypertensive urgency -BP improving.  Continue Coreg, Lasix, clonidine, DC amlodipine    Malignant neoplasm of upper-outer quadrant of right breast in female, estrogen receptor positive (Carrie Key) -Status post surgery, continues to follow with oncology, Dr Carrie Key.  Continue Arimidex    CKD (chronic kidney disease), stage III ([place]) -Follow creatinine closely with diuresis Baseline creatinine 1.2-1.3 -Currently stable 1.1    Non-insulin-dependent diabetes mellitus with renal complications (HCC) -Hold oral hypoglycemics inpatient -Continue sliding scale insulin  Hypokalemia Replaced  Code Status: Full CODE STATUS DVT Prophylaxis:  eliquis Family Communication: Discussed in detail with the patient, all imaging results,  lab results explained to the patient   Disposition Plan: PT OT evaluation, hopefully DC home in a.m. if no acute issues, 2D echo pending  Time Spent in minutes 35 minutes minutes  Procedures:  None  Consultants:   None  Antimicrobials:      Medications  Scheduled Meds: . amLODipine  5 mg Oral Daily  . anastrozole  1 mg Oral Daily  . apixaban  5 mg Oral BID  . carvedilol  3.125 mg Oral BID WC  . cloNIDine  0.3 mg Oral BID  . feeding supplement (ENSURE ENLIVE)  237 mL Oral BID BM  . furosemide  20 mg Intravenous BID  . gabapentin  300 mg Oral QHS  . insulin aspart  0-5  Units Subcutaneous QHS  . insulin aspart  0-9 Units Subcutaneous TID WC  . pantoprazole  40 mg Oral Daily  . [START ON 05/09/2017] pneumococcal 23 valent vaccine  0.5 mL Intramuscular Tomorrow-1000  . potassium chloride SA  20 mEq Oral Daily  . rosuvastatin  10 mg Oral q1800   Continuous Infusions: . diltiazem (CARDIZEM) infusion Stopped (05/08/17 0400)   PRN Meds:.acetaminophen, ALPRAZolam, labetalol, meclizine, ondansetron (ZOFRAN) IV, traMADol, zolpidem   Antibiotics   Anti-infectives (From admission, onward)   None        Subjective:   Carrie Key was seen and examined today.  Feeling better, no chest pain, shortness of breath is improving.  Patient denies dizziness,  abdominal pain, N/V/D/C, new weakness, numbess, tingling. No acute events overnight.    Objective:   Vitals:   05/08/17 0600 05/08/17 0630 05/08/17 0700 05/08/17 0857  BP: 132/60 132/66 113/69   Pulse: 70 70 78   Resp: (!) 24 17    Temp:    97.9 F (36.6 C)  TempSrc:    Oral  SpO2: (!) 88% 96% 96%   Weight:    102.7 kg (226 lb 6.4 oz)  Height:    5\' 7"  (1.702 m)    Intake/Output Summary (Last 24 hours) at 05/08/2017 1132 Last data filed at 05/08/2017 0400 Gross per 24 hour  Intake 43.37 ml  Output -  Net 43.37 ml     Wt Readings from Last 3 Encounters:  05/08/17 102.7 kg (226 lb 6.4 oz)  04/27/16 98 kg (216 lb 1.6 oz)  12/03/12 108.4 kg (239 lb)     Exam  General: Alert and oriented x 3, NAD  Eyes:   HEENT:  Atraumatic, normocephalic,  Cardiovascular: S1 S2 auscultated, irregularly regular  Respiratory: Clear to auscultation bilaterally, no wheezing, rales or rhonchi  Gastrointestinal: Soft, nontender, nondistended, + bowel sounds  Ext: 1-2 + pedal edema bilaterally  Neuro no new deficit  Musculoskeletal: No digital cyanosis, clubbing  Skin: No rashes  Psych: Normal affect and demeanor, alert and oriented x3    Data Reviewed:  I have personally reviewed following labs and  imaging studies  Micro Results No results found for this or any previous visit (from the past 240 hour(s)).  Radiology Reports Dg Chest 2 View  Result Date: 05/07/2017 CLINICAL DATA:  Chest pain EXAM: CHEST - 2 VIEW COMPARISON:  03/27/2017 chest radiograph. FINDINGS: Mild to moderate enlargement of the cardiopericardial silhouette, increased in the interval. Otherwise normal mediastinal contour. No pneumothorax. No pleural effusions. No overt pulmonary edema. No acute consolidative airspace disease. IMPRESSION: Mild-to-moderate enlargement of the cardiopericardial silhouette, increased in the interval. No overt pulmonary edema. No active pulmonary disease. Electronically Signed   By: Janina Mayo.D.  On: 05/07/2017 22:47    Lab Data:  CBC: Recent Labs  Lab 05/07/17 2202  WBC 7.1  HGB 13.9  HCT 41.7  MCV 93.1  PLT 706   Basic Metabolic Panel: Recent Labs  Lab 05/07/17 2202 05/08/17 0206  NA 136 137  K 3.5 3.2*  CL 99* 99*  CO2 20* 24  GLUCOSE 127* 137*  BUN 8 9  CREATININE 1.07* 1.16*  CALCIUM 9.7 9.4   GFR: Estimated Creatinine Clearance: 51.6 mL/min (A) (by C-G formula based on SCr of 1.16 mg/dL (H)). Liver Function Tests: No results for input(s): AST, ALT, ALKPHOS, BILITOT, PROT, ALBUMIN in the last 168 hours. No results for input(s): LIPASE, AMYLASE in the last 168 hours. No results for input(s): AMMONIA in the last 168 hours. Coagulation Profile: No results for input(s): INR, PROTIME in the last 168 hours. Cardiac Enzymes: No results for input(s): CKTOTAL, CKMB, CKMBINDEX, TROPONINI in the last 168 hours. BNP (last 3 results) No results for input(s): PROBNP in the last 8760 hours. HbA1C: No results for input(s): HGBA1C in the last 72 hours. CBG: Recent Labs  Lab 05/08/17 0039 05/08/17 0905  GLUCAP 138* 232*   Lipid Profile: No results for input(s): CHOL, HDL, LDLCALC, TRIG, CHOLHDL, LDLDIRECT in the last 72 hours. Thyroid Function Tests: Recent Labs     05/08/17 0206  TSH 2.471   Anemia Panel: No results for input(s): VITAMINB12, FOLATE, FERRITIN, TIBC, IRON, RETICCTPCT in the last 72 hours. Urine analysis:    Component Value Date/Time   COLORURINE YELLOW 06/21/2014 0946   APPEARANCEUR CLEAR 06/21/2014 0946   LABSPEC 1.020 02/18/2016 1154   PHURINE 6.0 02/18/2016 1154   PHURINE 5.0 06/21/2014 0946   GLUCOSEU Negative 02/18/2016 1154   HGBUR Negative 02/18/2016 1154   HGBUR NEGATIVE 06/21/2014 0946   BILIRUBINUR Negative 02/18/2016 1154   KETONESUR Negative 02/18/2016 1154   KETONESUR NEGATIVE 06/21/2014 0946   PROTEINUR Negative 02/18/2016 1154   PROTEINUR NEGATIVE 06/21/2014 0946   UROBILINOGEN 0.2 02/18/2016 1154   NITRITE Negative 02/18/2016 1154   NITRITE NEGATIVE 06/21/2014 0946   LEUKOCYTESUR Moderate 02/18/2016 1154     Jamichael Knotts M.D. [facility] 05/08/2017, 11:32 AM  Pager: [phone] Between 7am to 7pm - call Pager - [phone]  After 7pm go to [email or web address] - password [email or web address]  Call night coverage person covering after 7pm

## 2017-05-09 ENCOUNTER — Inpatient Hospital Stay (HOSPITAL_COMMUNITY): Payer: Medicare HMO

## 2017-05-09 DIAGNOSIS — I4891 Unspecified atrial fibrillation: Secondary | ICD-10-CM

## 2017-05-09 DIAGNOSIS — I10 Essential (primary) hypertension: Secondary | ICD-10-CM

## 2017-05-09 DIAGNOSIS — I5033 Acute on chronic diastolic (congestive) heart failure: Secondary | ICD-10-CM

## 2017-05-09 LAB — CBC
HCT: 36.5 % (ref 36.0–46.0)
Hemoglobin: 11.5 g/dL — ABNORMAL LOW (ref 12.0–15.0)
MCH: 29.7 pg (ref 26.0–34.0)
MCHC: 31.5 g/dL (ref 30.0–36.0)
MCV: 94.3 fL (ref 78.0–100.0)
PLATELETS: 203 10*3/uL (ref 150–400)
RBC: 3.87 MIL/uL (ref 3.87–5.11)
RDW: 13.9 % (ref 11.5–15.5)
WBC: 5.5 10*3/uL (ref 4.0–10.5)

## 2017-05-09 LAB — BASIC METABOLIC PANEL WITH GFR
Anion gap: 14 (ref 5–15)
BUN: 22 mg/dL — ABNORMAL HIGH (ref 6–20)
CO2: 23 mmol/L (ref 22–32)
Calcium: 9.4 mg/dL (ref 8.9–10.3)
Chloride: 97 mmol/L — ABNORMAL LOW (ref 101–111)
Creatinine, Ser: 2.02 mg/dL — ABNORMAL HIGH (ref 0.44–1.00)
GFR calc Af Amer: 27 mL/min — ABNORMAL LOW
GFR calc non Af Amer: 23 mL/min — ABNORMAL LOW
Glucose, Bld: 140 mg/dL — ABNORMAL HIGH (ref 65–99)
Potassium: 3.6 mmol/L (ref 3.5–5.1)
Sodium: 134 mmol/L — ABNORMAL LOW (ref 135–145)

## 2017-05-09 LAB — GLUCOSE, CAPILLARY
GLUCOSE-CAPILLARY: 140 mg/dL — AB (ref 65–99)
GLUCOSE-CAPILLARY: 122 mg/dL — AB (ref 65–99)
Glucose-Capillary: 131 mg/dL — ABNORMAL HIGH (ref 65–99)
Glucose-Capillary: 121 mg/dL — ABNORMAL HIGH (ref 65–99)

## 2017-05-09 LAB — MAGNESIUM: Magnesium: 2.5 mg/dL — ABNORMAL HIGH (ref 1.7–2.4)

## 2017-05-09 LAB — D-DIMER, QUANTITATIVE: D-Dimer, Quant: 3.4 ug{FEU}/mL — ABNORMAL HIGH (ref 0.00–0.50)

## 2017-05-09 MED ORDER — MENTHOL 3 MG MT LOZG
1.0000 | LOZENGE | OROMUCOSAL | Status: DC | PRN
  Administered 2017-05-15: 3 mg via ORAL
  Filled 2017-05-09 (×2): qty 9

## 2017-05-09 MED ORDER — METOPROLOL TARTRATE 5 MG/5ML IV SOLN
INTRAVENOUS | Status: AC
  Administered 2017-05-09: 5 mg via INTRAVENOUS
  Filled 2017-05-09: qty 5

## 2017-05-09 MED ORDER — METOPROLOL TARTRATE 5 MG/5ML IV SOLN
5.0000 mg | Freq: Once | INTRAVENOUS | Status: AC
  Administered 2017-05-09: 5 mg via INTRAVENOUS

## 2017-05-09 MED ORDER — METOPROLOL TARTRATE 5 MG/5ML IV SOLN
10.0000 mg | Freq: Once | INTRAVENOUS | Status: AC
  Administered 2017-05-10: 10 mg via INTRAVENOUS
  Filled 2017-05-09: qty 10

## 2017-05-09 NOTE — Progress Notes (Signed)
[facility]                                                                              Patient Demographics  Carrie Key, is a 77 y.o. female, DOB - [birth year], [id number]  Admit date - 05/07/2017   Admitting Physician Vianne Bulls, MD  Outpatient Primary MD for the patient is [place] Chard, MD  Outpatient specialists:   LOS - 2  days   Medical records reviewed and are as summarized below:    Chief Complaint  Patient presents with  . Atrial Fibrillation  . Chest Pain       Brief summary   Patient is a 77 year old female with A. fib on Eliquis, right breast CA status post resection, CKD stage III, chronic diastolic CHF, hypertension, diabetes mellitus presented with 3 days of palpitations, exertional dyspnea, increased leg swelling bilaterally.  In ED, patient was found to be tachycardiac heart rate in 130s, slightly tachypneic, BP 150/115.  EKG showed atrial for ablation with RVR.  Chest x-ray showed no pneumonia.  Patient was started on diltiazem infusion after bolus.  She was admitted to stepdown.    Assessment & Plan    Principal Problem: Chronic atrial fibrillation with RVR (HCC) -Presented with 3 days of palpitations, peripheral edema and dyspnea on exertion, found to be in rapid atrial fib with RVR.  Patient was placed on Cardizem drip, off since yesterday, -Patient was continued on the Coreg 3.125 mg twice a day -She had similar admission in 04/2016, was placed on oral Cardizem, decreased to 120 mg daily on 05/06/16 by cardiology outpatient due to bradycardia.  Norvasc was discontinued at the time.  However patient apparently never picked up her prescription hence remained off cardizem.  Low-dose Coreg in 4/18 was added.  Patient did not follow-up with cardiology subsequently.   -Continue eliquis -Clonidine, Norvasc, labetalol prn were discontinued on 4/1 due to 2-second sinus pauses -Heart rate now 110-140s, BP 157/97, gave Lopressor 5 mg IV  x1 cardiology consulted -Feeling more short of breath today, d-dimer 3.40, patient had not been taking Eliquis, had run out prior to admission, will obtain VQ scan  Active Problems:   Acute on chronic diastolic CHF (congestive heart failure) ([place]) -Presented with 3 days of palpitations, dyspnea on exertion with increased bilateral leg swelling.  BNP 405 -Patient was placed on Lasix 20 mg IV every 12 hours, Coreg -Creatinine trended up to 2.0 today, holding Lasix -Co. showed EF of 55-60%, no regional wall motion abnormalities, mild to moderate MR, mild AS, mild to moderate TR    HYPERTENSION, BENIGN with hypertensive urgency -Continue Coreg     Malignant neoplasm of upper-outer quadrant of right breast in female, estrogen receptor positive (HCC) -Status post surgery, continues to follow with oncology, Dr Jana Hakim.  Continue Arimidex    CKD (chronic kidney disease), stage III ([place]) -Follow creatinine closely with diuresis Baseline creatinine 1.2-1.3 -Currently stable 1.1    Non-insulin-dependent diabetes mellitus with renal complications ([place]), type II with hyperglycemia -Hold oral hypoglycemics inpatient -Continue sliding scale insulin   Code Status: Full CODE STATUS DVT Prophylaxis:  eliquis Family Communication: Discussed in  detail with the patient, all imaging results, lab results explained to the patient   Disposition Plan:  Time Spent in minutes 35 minutes  Procedures:  2D echo : EF 55-60%, normal wall motion  Consultants:   Cardiology  Antimicrobials:      Medications  Scheduled Meds: . anastrozole  1 mg Oral Daily  . apixaban  5 mg Oral BID  . carvedilol  3.125 mg Oral BID WC  . gabapentin  300 mg Oral QHS  . insulin aspart  0-5 Units Subcutaneous QHS  . insulin aspart  0-9 Units Subcutaneous TID WC  . pantoprazole  40 mg Oral Daily  . pneumococcal 23 valent vaccine  0.5 mL Intramuscular Tomorrow-1000  . potassium chloride SA  20 mEq Oral Daily  .  rosuvastatin  10 mg Oral q1800   Continuous Infusions: . diltiazem (CARDIZEM) infusion Stopped (05/08/17 0400)   PRN Meds:.acetaminophen, ALPRAZolam, hydrALAZINE, meclizine, menthol-cetylpyridinium, ondansetron (ZOFRAN) IV, traMADol, zolpidem   Antibiotics   Anti-infectives (From admission, onward)   None        Subjective:   Carrie Key was seen and examined today.  No chest pain however feeling somewhat short of breath today.  Heart rate still not well controlled.  110-140s.    Patient denies dizziness,  abdominal pain, N/V/D/C, new weakness, numbess, tingling.  Objective:   Vitals:   05/09/17 0552 05/09/17 0714 05/09/17 0857 05/09/17 1245  BP:  (!) 145/90  (!) 157/97  Pulse:  67 (!) 101 85  Resp:    19  Temp:  98.3 F (36.8 C)  98.6 F (37 C)  TempSrc:  Oral  Oral  SpO2:  100%  99%  Weight: 104.4 kg (230 lb 2.6 oz)     Height:        Intake/Output Summary (Last 24 hours) at 05/09/2017 1314 Last data filed at 05/09/2017 0934 Gross per 24 hour  Intake 563.58 ml  Output 200 ml  Net 363.58 ml     Wt Readings from Last 3 Encounters:  05/09/17 104.4 kg (230 lb 2.6 oz)  02/20/17 106.7 kg (235 lb 4.8 oz)  08/19/16 105.6 kg (232 lb 12.8 oz)     Exam   General: Alert and oriented x 3, NAD  Eyes:   HEENT:    Cardiovascular: Tachycardia, irregularly irregular  1+ pedal edema b/l  Respiratory: Clear to auscultation bilaterally, no wheezing, rales or rhonchi  Gastrointestinal: Soft, nontender, nondistended, + bowel sounds  Ext: 1+ pedal edema bilaterally  Neuro: no new deficit  Musculoskeletal: No digital cyanosis, clubbing  Skin: No rashes  Psych: Normal affect and demeanor, alert and oriented x3    Data Reviewed:  I have personally reviewed following labs and imaging studies  Micro Results No results found for this or any previous visit (from the past 240 hour(s)).  Radiology Reports Dg Chest 2 View  Result Date: 05/07/2017 CLINICAL DATA:   Chest pain EXAM: CHEST - 2 VIEW COMPARISON:  03/27/2017 chest radiograph. FINDINGS: Mild to moderate enlargement of the cardiopericardial silhouette, increased in the interval. Otherwise normal mediastinal contour. No pneumothorax. No pleural effusions. No overt pulmonary edema. No acute consolidative airspace disease. IMPRESSION: Mild-to-moderate enlargement of the cardiopericardial silhouette, increased in the interval. No overt pulmonary edema. No active pulmonary disease. Electronically Signed   By: Ilona Sorrel M.D.   On: 05/07/2017 22:47    Lab Data:  CBC: Recent Labs  Lab 05/07/17 2202 05/09/17 0218  WBC 7.1 5.5  HGB 13.9 11.5*  HCT  41.7 36.5  MCV 93.1 94.3  PLT 206 962   Basic Metabolic Panel: Recent Labs  Lab 05/07/17 2202 05/08/17 0206 05/08/17 1343 05/09/17 0218  NA 136 137  --  134*  K 3.5 3.2*  --  3.6  CL 99* 99*  --  97*  CO2 20* 24  --  23  GLUCOSE 127* 137*  --  140*  BUN 8 9  --  22*  CREATININE 1.07* 1.16*  --  2.02*  CALCIUM 9.7 9.4  --  9.4  MG  --   --  1.5* 2.5*   GFR: Estimated Creatinine Clearance: 29.9 mL/min (A) (by C-G formula based on SCr of 2.02 mg/dL (H)). Liver Function Tests: No results for input(s): AST, ALT, ALKPHOS, BILITOT, PROT, ALBUMIN in the last 168 hours. No results for input(s): LIPASE, AMYLASE in the last 168 hours. No results for input(s): AMMONIA in the last 168 hours. Coagulation Profile: No results for input(s): INR, PROTIME in the last 168 hours. Cardiac Enzymes: No results for input(s): CKTOTAL, CKMB, CKMBINDEX, TROPONINI in the last 168 hours. BNP (last 3 results) No results for input(s): PROBNP in the last 8760 hours. HbA1C: No results for input(s): HGBA1C in the last 72 hours. CBG: Recent Labs  Lab 05/08/17 1147 05/08/17 1626 05/08/17 2221 05/09/17 0832 05/09/17 1243  GLUCAP 128* 146* 197* 131* 140*   Lipid Profile: No results for input(s): CHOL, HDL, LDLCALC, TRIG, CHOLHDL, LDLDIRECT in the last 72  hours. Thyroid Function Tests: Recent Labs    05/08/17 0206  TSH 2.471   Anemia Panel: No results for input(s): VITAMINB12, FOLATE, FERRITIN, TIBC, IRON, RETICCTPCT in the last 72 hours. Urine analysis:    Component Value Date/Time   COLORURINE YELLOW 06/21/2014 0946   APPEARANCEUR CLEAR 06/21/2014 0946   LABSPEC 1.020 02/18/2016 1154   PHURINE 6.0 02/18/2016 1154   PHURINE 5.0 06/21/2014 0946   GLUCOSEU Negative 02/18/2016 1154   HGBUR Negative 02/18/2016 1154   HGBUR NEGATIVE 06/21/2014 0946   BILIRUBINUR Negative 02/18/2016 1154   KETONESUR Negative 02/18/2016 1154   KETONESUR NEGATIVE 06/21/2014 0946   PROTEINUR Negative 02/18/2016 1154   PROTEINUR NEGATIVE 06/21/2014 0946   UROBILINOGEN 0.2 02/18/2016 1154   NITRITE Negative 02/18/2016 1154   NITRITE NEGATIVE 06/21/2014 0946   LEUKOCYTESUR Moderate 02/18/2016 1154     Ripudeep Rai M.D. [facility] 05/09/2017, 1:14 PM  Pager: [phone] Between 7am to 7pm - call Pager - [phone]  After 7pm go to [email or web address] - password [email or web address]  Call night coverage person covering after 7pm

## 2017-05-09 NOTE — Progress Notes (Signed)
Nutrition Brief Note  Patient identified on the Malnutrition Screening Tool (MST) Report  Wt Readings from Last 15 Encounters:  05/09/17 230 lb 2.6 oz (104.4 kg)  04/27/16 216 lb 1.6 oz (98 kg)  12/03/12 239 lb (108.4 kg)   Carrie Key is a 77 y.o. female with medical history significant for atrial fibrillation on Eliquis, cancer of the right breast status post definitive resection, chronic kidney disease stage III, chronic diastolic CHF, hypertension, and non-insulin-dependent diabetes mellitus, now presenting to the emergency department with 3 days of palpitations, exertional dyspnea, and increased leg swelling bilaterally.  Pt admitted with a-fib with RVR.   Case discussed with RN, who reports pt is eating very well and likes the hospital food. Pt will not discharge home secondary to increased heart rate.   Spoke with pt at bedside, who reports decreased appetite 30-5 days PTA, however, this has improved drastically since hospital admission. Pt has been consuming 100% of meals. PTA pt typically consumes 2-3 meals per day (Breakfast: oatmeal and eggs, Lunch: chicken salad sandwich, Dinner: [place], collard greens, and other vegetable).   Pt reports UBW around 235#, reports wt fluctuations due to fluid gain and losses.   Nutrition-Focused physical exam completed. Findings are no fat depletion, no muscle depletion, and mild edema.   Discussed with pt importance of good meal completion to promote healing and manage DM and CHF (reviewed general, healthy diet).  Last Hgb A1c: 5.8 (04/26/16). Pt reports excellent glycemic control at home; CBGS range around 120. PTA DM medication 1mg  glimeperide daily.   Labs reviewed: Na: 134, Mg: 2.5, CBGS: 131-197 (inpatient orders for glycemic control are 0-5 units insulin aspart q HS and 0-9 units insulin aspart TID with meals).   Body mass index is 36.05 kg/m. Patient meets criteria for obesity, class II based on current BMI.   Current diet order is  Heart Healthy/ Carb Modified, patient is consuming approximately 100% of meals at this time. Labs and medications reviewed.   No nutrition interventions warranted at this time. If nutrition issues arise, please consult RD.   Michelle Vanhise A. Jimmye Norman, RD, LDN, CDE Pager: [phone] After hours Pager: [phone]

## 2017-05-09 NOTE — Progress Notes (Signed)
   Pt scheduled for TEE/DCCV on Friday 05/12/17 with Dr. Harrington Challenger at 0800 am. Will place orders tomorrow after [place] test.   Kathyrn Drown NP-C [facility] Pager: [phone]

## 2017-05-09 NOTE — Progress Notes (Signed)
Pt RR noted to be 26-32.  O2 saturation 94% RA.  Pt states "It feels like something is caught in my throat, I am having a hard time catching my breath."  2L O2 applied via nasal cannula.  O2 increased to 98%.  RR noted to decreased to 20.  Pt states "It feels better."  Pt denies CP or dizziness.  Educated to call RN if SOB or tachypnea returns.  Pt verbalizes understanding.

## 2017-05-09 NOTE — Consult Note (Addendum)
States that she ran out of her Eliquis   Cardiology Consultation:   Patient ID: Carrie Key; [id number]; [birth year]   Admit date: 05/07/2017 Date of Consult: 05/09/2017  Primary Care Provider: Glendale Chard, MD Primary Cardiologist: Dr. Stanford Breed   Patient Profile:   Carrie Key is a 77 y.o. female with a hx of PAF (on Eliquis-noncompliant), HTN, HLD, CKD stage III, DM type II and history of breast cancer with mastectomy in 2014 who is being seen today for the evaluation of atrial fibrillation with RVR at the request of Dr. Tana Coast.   History of Present Illness:   Carrie Key is a 77 year old female with a history as stated above who was initially seen as a hospital consult in March 2018 with new onset atrial fibrillation with RVR.  After stabilization with diltiazem drip, patient was transitioned to PO diltiazem 240 mg, discharged and followed as outpatient several days later. During her follow-up visit, patient was extremely fatigued and found to have a heart rate of 43bpm.  At that time, she had not yet picked up her Eliquis or her diltiazem prescriptions. Two weeks later, on 05/27/2016, she was seen in the office for close follow-up.  Once again, she had not yet picked up her diltiazem prescription.  Additionally, she had run out of her clonidine 0.3 mg.  The decision was made to completely stop diltiazem at that time and restart amlodipine for blood pressure control and start low-dose carvedilol. She has not followed up with cardiology since that time, 05/30/2016.    On 05/07/2017 patient presented to the emergency department with a 3-day complaint of palpitations, exertional dyspnea and increased lower extremity swelling. She reports becoming short of breath with short distances, which are normally not so taxing on her. She denies chest pain, dizziness, or syncope, however states that she has been having issues with orthopnea and fatigue. In the emergency department she was found to be tachycardic  with a heart rate in the 130s and slightly tachypneic. An EKG was performed which showed atrial fibrillation with RVR. A Chest x-ray was performed which revealed mild to moderate enlargement of the cardiopericardial silhouette and no overt pulmonary edema, and no active pulmonary disease. Her  iPOC troponin level was 0.04.  BNP level was drawn which was elevated at 405.  Her creatinine is mildly elevated at 1.07.   The patient was admitted by hospitalist service with cardiology consultation for atrial fibrillation with RVR and acute on chronic diastolic heart failure.  Past Medical History:  Diagnosis Date  . Atrial fibrillation ([place])   . Atrial fibrillation with RVR ([place]) 05/08/2017  . Breast cancer ([place])   . Cancer ([place])   . CKD (chronic kidney disease), stage III ([place]) 07/10/2014  . Diabetes mellitus without complication ([place])   . Dyspnea   . GERD (gastroesophageal reflux disease)   . Heart murmur   . Hyperlipemia   . Hypertension   . Wears glasses     Past Surgical History:  Procedure Laterality Date  . ABDOMINAL HYSTERECTOMY    . BACK SURGERY  2002   lumbar lam  . CARDIAC CATHETERIZATION  2003  . COLONOSCOPY    . SIMPLE MASTECTOMY WITH AXILLARY SENTINEL NODE BIOPSY Right 12/03/2012   Procedure: RIGHT SIMPLE MASTECTOMY WITH RIGHT  AXILLARY SENTINEL LYMPH NODE BIOPSY;  Surgeon: Shann Medal, MD;  Location: [facility];  Service: General;  Laterality: Right;  . UPPER GI ENDOSCOPY       Prior to Admission medications  Medication Sig Start Date End Date Taking? Authorizing Provider  albuterol (PROVENTIL HFA;VENTOLIN HFA) 108 (90 Base) MCG/ACT inhaler Inhale 1-2 puffs into the lungs every 6 (six) hours as needed for wheezing or shortness of breath.   Yes [provider]  amLODipine (NORVASC) 5 MG tablet Take 1 tablet (5 mg total) by mouth daily. 05/27/16 05/07/17 Yes Almyra Deforest, PA  anastrozole (ARIMIDEX) 1 MG tablet Take 1 tablet (1 mg total) by mouth daily.  02/20/17  Yes Magrinat, Virgie Dad, MD  apixaban (ELIQUIS) 5 MG TABS tablet Take 1 tablet (5 mg total) by mouth 2 (two) times daily. 04/27/16  Yes Duke, Tami Lin, PA  cloNIDine (CATAPRES) 0.3 MG tablet Take 1 tablet (0.3 mg total) by mouth 2 (two) times daily. MUST GET REFILLS WITH PCP 05/27/16  Yes Almyra Deforest, PA  gabapentin (NEURONTIN) 300 MG capsule Take 1 capsule (300 mg total) by mouth at bedtime. 02/20/17  Yes Magrinat, Virgie Dad, MD  glimepiride (AMARYL) 1 MG tablet Take 1 mg by mouth daily with breakfast.   Yes [provider]  pravastatin (PRAVACHOL) 10 MG tablet Take 10 mg by mouth daily.   Yes [provider]  carvedilol (COREG) 3.125 MG tablet Take 1 tablet (3.125 mg total) by mouth 2 (two) times daily. Patient not taking: Reported on 08/19/2016 05/27/16 08/25/16  Almyra Deforest, PA  meclizine (ANTIVERT) 12.5 MG tablet Take 1 tablet (12.5 mg total) by mouth 3 (three) times daily as needed for dizziness. Patient not taking: Reported on 05/08/2017 06/10/16   Frederica Kuster, PA-C  omeprazole (PRILOSEC) 40 MG capsule Take 40 mg by mouth daily.     Buccini, Robert, MD  potassium chloride SA (K-DUR,KLOR-CON) 20 MEQ tablet Take 1 tablet (20 mEq total) by mouth daily. Patient not taking: Reported on 05/08/2017 04/27/16   Ledora Bottcher, PA  rosuvastatin (CRESTOR) 10 MG tablet Take 10 mg by mouth daily.    Willey Blade, MD  traMADol (ULTRAM) 50 MG tablet Take 1 tablet (50 mg total) by mouth every 6 (six) hours as needed. for pain Patient not taking: Reported on 05/08/2017 02/20/17   Magrinat, Virgie Dad, MD    Inpatient Medications: Scheduled Meds: . anastrozole  1 mg Oral Daily  . apixaban  5 mg Oral BID  . carvedilol  3.125 mg Oral BID WC  . gabapentin  300 mg Oral QHS  . insulin aspart  0-5 Units Subcutaneous QHS  . insulin aspart  0-9 Units Subcutaneous TID WC  . pantoprazole  40 mg Oral Daily  . pneumococcal 23 valent vaccine  0.5 mL Intramuscular Tomorrow-1000  . potassium  chloride SA  20 mEq Oral Daily  . rosuvastatin  10 mg Oral q1800   Continuous Infusions: . diltiazem (CARDIZEM) infusion Stopped (05/08/17 0400)   PRN Meds: acetaminophen, ALPRAZolam, hydrALAZINE, meclizine, menthol-cetylpyridinium, ondansetron (ZOFRAN) IV, traMADol, zolpidem  Allergies:   No Known Allergies  Social History:   Social History   Socioeconomic History  . Marital status: Widowed    Spouse name: Not on file  . Number of children: 5  . Years of education: 70  . Highest education level: Not on file  Occupational History  . Occupation: Retired    Comment: disability prior to retirement  Social Needs  . Financial resource strain: Not on file  . Food insecurity:    Worry: Not on file    Inability: Not on file  . Transportation needs:    Medical: Not on file  Non-medical: Not on file  Tobacco Use  . Smoking status: Former Smoker    Packs/day: 0.30    Years: 30.00    Pack years: 9.00    Types: Cigarettes    Last attempt to quit: 11/26/1988    Years since quitting: 28.4  . Smokeless tobacco: Never Used  Substance and Sexual Activity  . Alcohol use: Yes    Alcohol/week: 2.4 oz    Types: 4 Glasses of wine per week    Comment: occ  . Drug use: No  . Sexual activity: Yes    Birth control/protection: Post-menopausal  Lifestyle  . Physical activity:    Days per week: Not on file    Minutes per session: Not on file  . Stress: Not on file  Relationships  . Social connections:    Talks on phone: Not on file    Gets together: Not on file    Attends religious service: Not on file    Active member of club or organization: Not on file    Attends meetings of clubs or organizations: Not on file    Relationship status: Not on file  . Intimate partner violence:    Fear of current or ex partner: Not on file    Emotionally abused: Not on file    Physically abused: Not on file    Forced sexual activity: Not on file  Other Topics Concern  . Not on file  Social  History Narrative   Patient is single and lives alone.   Patient is retired.   Patient has five adult children.   Patient has a 11 grade education   Patient is right-handed.   Patient drinks one cup of coffee and two cups of tea daily.    Family History:   Family History  Problem Relation Age of Onset  . Hypertension Mother   . Stroke Mother   . Hypertension Father   . Heart attack Father   . Hypertension Paternal Grandmother    Family Status:  Family Status  Relation Name Status  . Mother  Deceased at age 25       Stroke  . Father  Deceased at age 49       heart attack  . PGM  (Not Specified)    ROS:  Please see the history of present illness.  All other ROS reviewed and negative.     Physical Exam/Data:   Vitals:   05/09/17 0344 05/09/17 0552 05/09/17 0714 05/09/17 0857  BP: 113/83  (!) 145/90   Pulse: 75  67 (!) 101  Resp: 15     Temp: 98.7 F (37.1 C)  98.3 F (36.8 C)   TempSrc: Oral  Oral   SpO2: 98%  100%   Weight:  230 lb 2.6 oz (104.4 kg)    Height:        Intake/Output Summary (Last 24 hours) at 05/09/2017 1257 Last data filed at 05/09/2017 0934 Gross per 24 hour  Intake 563.58 ml  Output 200 ml  Net 363.58 ml   Filed Weights   05/08/17 0857 05/09/17 0552  Weight: 226 lb 6.4 oz (102.7 kg) 230 lb 2.6 oz (104.4 kg)   Body mass index is 36.05 kg/m.   General: Obese, NAD Skin: Warm, dry, intact  Head: Normocephalic, atraumatic, clear, moist mucus membranes. Neck: Negative for carotid bruits. No JVD Lungs:Clear to ausculation bilaterally. No wheezes, rales, or rhonchi. Breathing is unlabored. Cardiovascular: Irregularly irregular with S1 S2. No murmurs, rubs or gallops  Abdomen: Soft, non-tender, non-distended with normoactive bowel sounds.  No obvious abdominal masses. MSK: Strength and tone appear normal for age. 5/5 in all extremities Extremities: Mild 2+ BLE edema. No clubbing or cyanosis. DP/PT pulses 1+ bilaterally Neuro: Alert and oriented.  No focal deficits. No facial asymmetry. MAE spontaneously. Psych: Responds to questions appropriately with normal affect.    EKG:  The EKG was personally reviewed and demonstrates: Atrial fibrillation with HR 72. T-wave inversion in leads V2-V6 Telemetry:  Telemetry was personally reviewed and demonstrates: 05/09/17 Artrial fibrillation HR 104   Relevant CV Studies:  ECHO: 05/08/2017  Study Conclusions  - Left ventricle: The cavity size was normal. Wall thickness was   increased in a pattern of moderate LVH. Systolic function was   normal. The estimated ejection fraction was in the range of 55% to 60%. Wall motion was normal; there were no regional wall motion abnormalities. The study was not technically sufficient to allow evaluation of LV diastolic dysfunction due to atrial fibrillation. - Aortic valve: Trileaflet; mildly thickened, mildly calcified   leaflets. There was mild stenosis. - Aorta: Ascending aortic diameter: 42 mm (S). Mild ascending   aortic dilatation. - Mitral valve: There was mild to moderate regurgitation. - Atrial septum: No defect or patent foramen ovale was identified. - Tricuspid valve: There was mild-moderate regurgitation. - Pulmonary arteries: PA peak pressure: 33 mm Hg (S).  CATH: NA  Laboratory Data:  Chemistry Recent Labs  Lab 05/07/17 2202 05/08/17 0206 05/09/17 0218  NA 136 137 134*  K 3.5 3.2* 3.6  CL 99* 99* 97*  CO2 20* 24 23  GLUCOSE 127* 137* 140*  BUN 8 9 22*  CREATININE 1.07* 1.16* 2.02*  CALCIUM 9.7 9.4 9.4  GFRNONAA 49* 45* 23*  GFRAA 57* 52* 27*  ANIONGAP 17* 14 14    Total Protein  Date Value Ref Range Status  02/20/2017 7.7 6.4 - 8.3 g/dL Final  01/28/2015 7.3 6.4 - 8.3 g/dL Final   Albumin  Date Value Ref Range Status  02/20/2017 3.7 3.5 - 5.0 g/dL Final  01/28/2015 3.7 3.5 - 5.0 g/dL Final   AST  Date Value Ref Range Status  02/20/2017 31 5 - 34 U/L Final  01/28/2015 15 5 - 34 U/L Final   ALT  Date Value Ref  Range Status  02/20/2017 19 0 - 55 U/L Final  01/28/2015 <9 0 - 55 U/L Final   Alkaline Phosphatase  Date Value Ref Range Status  02/20/2017 92 40 - 150 U/L Final  01/28/2015 66 40 - 150 U/L Final   Total Bilirubin  Date Value Ref Range Status  02/20/2017 0.6 0.2 - 1.2 mg/dL Final  01/28/2015 0.49 0.20 - 1.20 mg/dL Final   Hematology Recent Labs  Lab 05/07/17 2202 05/09/17 0218  WBC 7.1 5.5  RBC 4.48 3.87  HGB 13.9 11.5*  HCT 41.7 36.5  MCV 93.1 94.3  MCH 31.0 29.7  MCHC 33.3 31.5  RDW 13.9 13.9  PLT 206 203   Cardiac EnzymesNo results for input(s): TROPONINI in the last 168 hours.  Recent Labs  Lab 05/07/17 2211  TROPIPOC 0.04    BNP Recent Labs  Lab 05/07/17 2206  BNP 405.0*    DDimer  Recent Labs  Lab 05/09/17 1032  DDIMER 3.40*   TSH:  Lab Results  Component Value Date   TSH 2.471 05/08/2017   Lipids: Lab Results  Component Value Date   CHOL 144 04/27/2016   HDL 59 04/27/2016   [place]  58 04/27/2016   TRIG 137 04/27/2016   CHOLHDL 2.4 04/27/2016   HgbA1c: Lab Results  Component Value Date   HGBA1C 5.8 (H) 04/26/2016    Radiology/Studies:  Dg Chest 2 View  Result Date: 05/07/2017 CLINICAL DATA:  Chest pain EXAM: CHEST - 2 VIEW COMPARISON:  03/27/2017 chest radiograph. FINDINGS: Mild to moderate enlargement of the cardiopericardial silhouette, increased in the interval. Otherwise normal mediastinal contour. No pneumothorax. No pleural effusions. No overt pulmonary edema. No acute consolidative airspace disease. IMPRESSION: Mild-to-moderate enlargement of the cardiopericardial silhouette, increased in the interval. No overt pulmonary edema. No active pulmonary disease. Electronically Signed   By: Ilona Sorrel M.D.   On: 05/07/2017 22:47   Assessment and Plan:   1.  Paroxysmal atrial fibrillation with RVR: -Patient presented with 3 days of palpitations, peripheral edema and dyspnea on exertion found to be in Afib with RVR in the emergency  department -Patient had similar presentation in 04/2016 with Afib RVR and heart failure symptoms.  After transition from IV diltiazem to 240 mg PO, she was subsequently found to be bradycardic at her follow-up appointment several days later. Given her bradycardia, she was followed up once again (two weeks later). At that time, she remained mildly bradycardic at 57, her diltiazem was discontinued and replaced with low-dose Coreg and amlodipine given her noncompliance with picking up the medications and essentially being of fof it from discharge to OP office appointment -In the ED, she was initially started on a diltiazem gtt which has now been discontinued.  -Carvedilol 3.125 mg PO BID -Heart rate is remained stable ranging from 62-101 bpm -Continue Eliquis 5 mg. Patient reports inconsistent use -Feeling more SOB today, now with elevated D-dimer>>>VQ scan ordered per primary team -Will plan for a TEE/DCCV Thursday as there is no room on the schedule for tomorrow  -CHA2DS2VASc =6 (age, sex, CHF hx, HTN, DM)   3.  Acute on chronic diastolic congestive heart failure: -Per echocardiogram 04/27/2016 patient with normal LVEF at 55-60% with normal wall motion.  There is evidence of moderate diastolic dysfunction and mildly dilated RV. -Follow-up echocardiogram on 05/08/2017 with normal LVEF at 55-60%, normal wall motion and technically insufficient to allow evaluation of LV diastolic dysfunction secondary to atrial fibrillation. -BNP on arrival, 405 -Patient given IV Lasix 20 mg twice daily which is now been discontinued secondary to up-trending renal function (2.02) -Weight, 230lb -I&O, net +407 mL, patient total output 200 mL   4.  Hypertension: -Elevated, 157/97, 145/90, 113/83, 144/106 -Consider restarting home dose amlodipine 5 mg   5. Chronic kidney disease stage III: -Creatinine, 2.02 today, up from 1.16 yesterday -Baseline appears to be in the 1.2-1.4 range -We will avoid nephrotoxic  medications   6.  DM 2: -Per primary team -SSI  7.  Elevated d-dimer: -D-dimer drawn per primary team which was elevated at 3.40 -VQ scan ordered per primary team   8. Noncompliance: -Inconsistencies with Eliquis. Pt states that she ran out of her Eliquis a few weeks ago. She has only been taking it once per day, not twice per day as prescribed.      For questions or updates, please contact [place] Please consult [email or web address] for contact info under Cardiology/STEMI.   SignedKathyrn Drown NP-C [facility] Pager: [phone] 05/09/2017 12:57 PM  As above, patient seen and examined.  Briefly she is a 77 year old female with past medical history of paroxysmal atrial fibrillation, hypertension, hyperlipidemia, chronic stage III kidney disease, diabetes mellitus, history of breast cancer  for evaluation of recurrent atrial fibrillation.  Patient states that for the past 2 weeks she has had intermittent dizziness.  Over the past week she noticed palpitations, increased dyspnea on exertion, increased pedal edema.  No exertional chest pain or syncope.  She was admitted with atrial fibrillation and cardiology asked to evaluate.  She has had some bradycardia with higher doses of Cardizem in the past.  Creatinine has increased to 2.02.  Echocardiogram shows normal LV function, mild aortic stenosis, mildly dilated aortic root, mild to moderate mitral and tricuspid regurgitation.  Electrocardiogram shows atrial fibrillation with increased anterior T wave inversion.   1 paroxysmal atrial fibrillation-patient is symptomatic with increased dyspnea and palpitations.  Plan to continue apixaban. CHADSvasc 5.  I will increase carvedilol to 6.25 mg twice daily for rate control.  We will plan to proceed with transesophageal echocardiogram guided cardioversion on Friday (first available time).  2 EKG changes-patient with anterior T wave changes which are new.  She is not having chest pain.  We will  arrange a [place] nuclear study for tomorrow morning for risk stratification.  Note she is scheduled for a VQ scan but we will postpone this until after the stress nuclear study.  We can then perform the V/Q scan 48 hours later.  She is already on anticoagulation.  3 hypertension-continue present medications and follow.  4 acute on chronic stage III kidney disease-renal function worse.  We will follow.  5 Acute on chronic diastolic congestive heart failure-would hold on further diuresis given increased creatinine.  Kirk Ruths, MD

## 2017-05-10 ENCOUNTER — Inpatient Hospital Stay (HOSPITAL_COMMUNITY): Payer: Medicare HMO

## 2017-05-10 DIAGNOSIS — N183 Chronic kidney disease, stage 3 (moderate): Secondary | ICD-10-CM

## 2017-05-10 DIAGNOSIS — E1122 Type 2 diabetes mellitus with diabetic chronic kidney disease: Secondary | ICD-10-CM

## 2017-05-10 DIAGNOSIS — R079 Chest pain, unspecified: Secondary | ICD-10-CM

## 2017-05-10 LAB — CBC
HCT: 40.5 % (ref 36.0–46.0)
HEMOGLOBIN: 13.1 g/dL (ref 12.0–15.0)
MCH: 30.2 pg (ref 26.0–34.0)
MCHC: 32.3 g/dL (ref 30.0–36.0)
MCV: 93.3 fL (ref 78.0–100.0)
PLATELETS: 229 10*3/uL (ref 150–400)
RBC: 4.34 MIL/uL (ref 3.87–5.11)
RDW: 13.7 % (ref 11.5–15.5)
WBC: 6.4 10*3/uL (ref 4.0–10.5)

## 2017-05-10 LAB — BASIC METABOLIC PANEL
ANION GAP: 13 (ref 5–15)
Anion gap: 12 (ref 5–15)
BUN: 18 mg/dL (ref 6–20)
BUN: 17 mg/dL (ref 6–20)
CHLORIDE: 103 mmol/L (ref 101–111)
CHLORIDE: 102 mmol/L (ref 101–111)
CO2: 24 mmol/L (ref 22–32)
CO2: 23 mmol/L (ref 22–32)
Calcium: 9.8 mg/dL (ref 8.9–10.3)
Calcium: 10 mg/dL (ref 8.9–10.3)
Creatinine, Ser: 1.21 mg/dL — ABNORMAL HIGH (ref 0.44–1.00)
Creatinine, Ser: 1.45 mg/dL — ABNORMAL HIGH (ref 0.44–1.00)
GFR calc non Af Amer: 34 mL/min — ABNORMAL LOW (ref >60)
GFR, EST AFRICAN AMERICAN: 49 mL/min — AB (ref >60)
GFR, EST AFRICAN AMERICAN: 40 mL/min — AB (ref >60)
GFR, EST NON AFRICAN AMERICAN: 43 mL/min — AB (ref >60)
Glucose, Bld: 122 mg/dL — ABNORMAL HIGH (ref 65–99)
Glucose, Bld: 143 mg/dL — ABNORMAL HIGH (ref 65–99)
POTASSIUM: 3.4 mmol/L — AB (ref 3.5–5.1)
Potassium: 3.7 mmol/L (ref 3.5–5.1)
SODIUM: 139 mmol/L (ref 135–145)
Sodium: 138 mmol/L (ref 135–145)

## 2017-05-10 LAB — GLUCOSE, CAPILLARY
Glucose-Capillary: 128 mg/dL — ABNORMAL HIGH (ref 65–99)
Glucose-Capillary: 183 mg/dL — ABNORMAL HIGH (ref 65–99)
Glucose-Capillary: 116 mg/dL — ABNORMAL HIGH (ref 65–99)
Glucose-Capillary: 131 mg/dL — ABNORMAL HIGH (ref 65–99)

## 2017-05-10 LAB — NM MYOCAR MULTI W/SPECT W/WALL MOTION / EF
CHL CUP MPHR: 145 {beats}/min
CHL CUP RESTING HR STRESS: 105 {beats}/min
Estimated workload: 1 METS
Exercise duration (min): 5 min
Exercise duration (sec): 30 s
Peak HR: 148 {beats}/min
Percent HR: 102 %

## 2017-05-10 LAB — MRSA PCR SCREENING: MRSA BY PCR: NEGATIVE

## 2017-05-10 MED ORDER — REGADENOSON 0.4 MG/5ML IV SOLN
0.4000 mg | Freq: Once | INTRAVENOUS | Status: AC
  Administered 2017-05-10: 0.4 mg via INTRAVENOUS
  Filled 2017-05-10: qty 5

## 2017-05-10 MED ORDER — TECHNETIUM TC 99M TETROFOSMIN IV KIT
10.0000 | PACK | Freq: Once | INTRAVENOUS | Status: AC | PRN
  Administered 2017-05-10: 10 via INTRAVENOUS

## 2017-05-10 MED ORDER — CLONIDINE HCL 0.1 MG PO TABS
0.1000 mg | ORAL_TABLET | Freq: Once | ORAL | Status: AC
  Administered 2017-05-10: 0.1 mg via ORAL

## 2017-05-10 MED ORDER — CLONIDINE HCL 0.1 MG PO TABS
ORAL_TABLET | ORAL | Status: AC
  Administered 2017-05-10: 0.1 mg via ORAL
  Filled 2017-05-10: qty 1

## 2017-05-10 MED ORDER — TECHNETIUM TC 99M TETROFOSMIN IV KIT
30.0000 | PACK | Freq: Once | INTRAVENOUS | Status: AC | PRN
  Administered 2017-05-10: 30 via INTRAVENOUS

## 2017-05-10 MED ORDER — REGADENOSON 0.4 MG/5ML IV SOLN
INTRAVENOUS | Status: AC
  Administered 2017-05-10: 0.4 mg via INTRAVENOUS
  Filled 2017-05-10: qty 5

## 2017-05-10 MED ORDER — AMLODIPINE BESYLATE 5 MG PO TABS
5.0000 mg | ORAL_TABLET | Freq: Every day | ORAL | Status: DC
  Administered 2017-05-10 – 2017-05-13 (×4): 5 mg via ORAL
  Filled 2017-05-10 (×4): qty 1

## 2017-05-10 MED ORDER — HYDRALAZINE HCL 50 MG PO TABS
25.0000 mg | ORAL_TABLET | Freq: Three times a day (TID) | ORAL | Status: DC
  Administered 2017-05-10 – 2017-05-14 (×12): 25 mg via ORAL
  Filled 2017-05-10 (×12): qty 1

## 2017-05-10 MED ORDER — HYDRALAZINE HCL 20 MG/ML IJ SOLN
10.0000 mg | Freq: Once | INTRAMUSCULAR | Status: AC
  Administered 2017-05-10: 10 mg via INTRAVENOUS
  Filled 2017-05-10: qty 1

## 2017-05-10 MED ORDER — CARVEDILOL 6.25 MG PO TABS
6.2500 mg | ORAL_TABLET | Freq: Two times a day (BID) | ORAL | Status: DC
  Administered 2017-05-10: 6.25 mg via ORAL
  Filled 2017-05-10 (×2): qty 1

## 2017-05-10 MED ORDER — SODIUM CHLORIDE 0.9 % IV SOLN
INTRAVENOUS | Status: DC
  Administered 2017-05-11: 10:00:00 via INTRAVENOUS

## 2017-05-10 NOTE — Progress Notes (Signed)
   Doris Cheadle presented for a nuclear stress test today.  No immediate complications.  Stress imaging is pending at this time.  Preliminary EKG findings may be listed in the chart, but the stress test result will not be finalized until perfusion imaging is complete.   Kathyrn Drown, NP-C 05/10/2017, 12:11 PM

## 2017-05-10 NOTE — Consult Note (Addendum)
Progress Note  Patient Name: Maribel Hadley Date of Encounter: 05/10/2017  Primary Cardiologist: Dr Stanford Breed  Subjective   No chest pain; mild dyspnea  Inpatient Medications    Scheduled Meds: . anastrozole  1 mg Oral Daily  . apixaban  5 mg Oral BID  . carvedilol  3.125 mg Oral BID WC  . gabapentin  300 mg Oral QHS  . insulin aspart  0-5 Units Subcutaneous QHS  . insulin aspart  0-9 Units Subcutaneous TID WC  . pantoprazole  40 mg Oral Daily  . pneumococcal 23 valent vaccine  0.5 mL Intramuscular Tomorrow-1000  . potassium chloride SA  20 mEq Oral Daily  . rosuvastatin  10 mg Oral q1800   Continuous Infusions: . diltiazem (CARDIZEM) infusion Stopped (05/08/17 0400)   PRN Meds: acetaminophen, ALPRAZolam, hydrALAZINE, meclizine, menthol-cetylpyridinium, ondansetron (ZOFRAN) IV, traMADol, zolpidem   Vital Signs    Vitals:   05/10/17 0210 05/10/17 0406 05/10/17 0809 05/10/17 0828  BP: (!) 147/115 (!) 148/89 (!) 222/190 (!) 206/160  Pulse:  (!) 106  (!) 106  Resp: 15 (!) 23 (!) 21   Temp:  98.6 F (37 C) 99.2 F (37.3 C)   TempSrc:  Oral Oral   SpO2:  98% 95%   Weight:      Height:        Intake/Output Summary (Last 24 hours) at 05/10/2017 0852 Last data filed at 05/09/2017 1355 Gross per 24 hour  Intake 462 ml  Output -  Net 462 ml   Filed Weights   05/08/17 0857 05/09/17 0552  Weight: 226 lb 6.4 oz (102.7 kg) 230 lb 2.6 oz (104.4 kg)    Telemetry    Atrial fibrillation with intermittent elevated rate- Personally Reviewed   Physical Exam   GEN: No acute distress.   Neck: No JVD Cardiac: irregular and tachycardic Respiratory: Clear to auscultation bilaterally. GI: Soft, nontender, non-distended  MS: No edema Neuro:  Nonfocal    Labs    Chemistry Recent Labs  Lab 05/08/17 0206 05/09/17 0218 05/10/17 0310  NA 137 134* 139  K 3.2* 3.6 3.4*  CL 99* 97* 103  CO2 24 23 24   GLUCOSE 137* 140* 122*  BUN 9 22* 18  CREATININE 1.16* 2.02* 1.21*    CALCIUM 9.4 9.4 9.8  GFRNONAA 45* 23* 43*  GFRAA 52* 27* 49*  ANIONGAP 14 14 12      Hematology Recent Labs  Lab 05/07/17 2202 05/09/17 0218 05/10/17 0310  WBC 7.1 5.5 6.4  RBC 4.48 3.87 4.34  HGB 13.9 11.5* 13.1  HCT 41.7 36.5 40.5  MCV 93.1 94.3 93.3  MCH 31.0 29.7 30.2  MCHC 33.3 31.5 32.3  RDW 13.9 13.9 13.7  PLT 206 203 229    Recent Labs  Lab 05/07/17 2211  TROPIPOC 0.04     BNP Recent Labs  Lab 05/07/17 2206  BNP 405.0*     DDimer  Recent Labs  Lab 05/09/17 1032  DDIMER 3.40*     Radiology    Dg Chest 2 View  Result Date: 05/09/2017 CLINICAL DATA:  Dyspnea EXAM: CHEST - 2 VIEW COMPARISON:  05/07/2017 chest radiograph. FINDINGS: Stable cardiomediastinal silhouette with mild cardiomegaly. No pneumothorax. No pleural effusion. Cephalization of the pulmonary vasculature without overt pulmonary edema. No consolidative airspace disease. IMPRESSION: Stable mild cardiomegaly without overt pulmonary edema. No active pulmonary disease. Electronically Signed   By: Ilona Sorrel M.D.   On: 05/09/2017 20:21    Patient Profile     77 year old  female with past medical history of paroxysmal atrial fibrillation, hypertension, hyperlipidemia, chronic stage III kidney disease, diabetes mellitus, history of breast cancer with recurrent atrial fibrillation. Echocardiogram shows normal LV function, mild aortic stenosis, mildly dilated aortic root, mild to moderate mitral and tricuspid regurgitation.  Electrocardiogram shows atrial fibrillation with increased anterior T wave inversion.     Assessment & Plan    1 paroxysmal atrial fibrillation-patient remains in atrial fibrillation this morning.  I will continue apixaban.  She is scheduled for TEE guided cardioversion on Friday.  Continue carvedilol but increased to 6.25 mg twice daily.  Continue Cardizem.    2 EKG changes-patient noted to have new anterior T wave inversion on electrocardiogram.  She is scheduled for a  Lexiscan nuclear study this morning to further assess.  If markedly abnormal would need to hold apixaban and proceed with catheterization on Friday.  If low risk will treat medically.   3 hypertension-Blood pressure is elevated; add hydralazine 25 mg 3 times daily.    4 acute on chronic stage III kidney disease-renal function improved this morning.  5 Acute on chronic diastolic congestive heart failure-we will hold diuretics for now.  6 elevated d-dimer-patient is on apixaban.  She is scheduled for a nuclear study this morning for anterior T wave inversion noted on ECG.  Can proceed with VQ scan 24-48 hours later.  For questions or updates, please contact [place] Please consult [email or web address] for contact info under Cardiology/STEMI.      Signed, Kirk Ruths, MD  05/10/2017, 8:52 AM

## 2017-05-10 NOTE — Progress Notes (Addendum)
   Lexiscan myoview results show moderately reduced LV function with EF 42% and apical septal wall akinesis. Evidence of prior MI with peri-infarct ischemia. Intermediate risk study.   I have discussed the results with the patient and discussed proceeding with cardiac cath to further define her coronary artery status. Questions answered. The patient understands that risks included but are not limited to stroke (1 in 1000), death (1 in 84), kidney failure [usually temporary] (1 in 500), bleeding (1 in 200), allergic reaction [possibly serious] (1 in 200). She agrees to proceed with cardiac cath.   Eliquis discontinued and Orders placed for cath on Friday. Last dose of Eliquis was at 09:23 this morning 05/10/17.    Daune Perch, AGNP-C [facility] 05/10/2017  5:40 PM Pager: [phone]

## 2017-05-10 NOTE — Progress Notes (Signed)
PROGRESS NOTE    Carrie Key  WUJ:[id number] DOB: Dec 25, 1940 DOA: 05/07/2017 PCP: Glendale Chard, MD   Brief Narrative: Carrie Key is a 78 y.o. female with A. fib on Eliquis, right breast CA status post resection, CKD stage III, chronic diastolic CHF, hypertension, diabetes mellitus. She presented secondary to palpitations and found to be in atrial fibrillation with RVR.   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 HR still with episode of RVR. On Eliquis -Cardiology recommendations: planned Transesophageal Echocardiogram cardioversion for 4/5; Coreg increased -Eliquis  Acute on chronic diastolic heart failure Improved. In setting of atrial fibrillation with RVR. Weight 226 lbs on admission. 230 lbs today. No urine output measured. -Cardiology recommendations: Holding diuretics  EKG with T-wave inversions -Cardiology recommendations: nuclear stress test today  Essential hypertension Elevated -Continue coreg -Cardiology recommendations: hydralazine 25 mg TID added  Breast cancer Outpatient follow-up with oncology -Continue Arimidex  CKD stage 3 Baseline creatinine.  Diabetes mellitus, typpe 2 -Continue SSI  Elevated D-dimer V/Q scan 24-48 hours after nuclear study. No chest pain or dyspnea. Low suspicion for PE. No unilateral leg swelling concerning for DVT  Hyperlipidemia -Continue Crestor   DVT prophylaxis: Eliquis Code Status:   Code Status: Full Code Family Communication: None at bedside Disposition Plan: Discharge pending  cardiology recommendations   Consultants:   Cardiology  Procedures:   Transthoracic Echocardiogram (05/08/17) Study Conclusions  - Left ventricle: The cavity size was normal. Wall thickness was   increased in a pattern of moderate LVH. Systolic function was   normal. The estimated ejection fraction was in the range of 55%   to 60%. Wall motion was normal; there were no regional wall   motion abnormalities. The study was not technically sufficient to   allow evaluation of LV diastolic dysfunction due to atrial   fibrillation. - Aortic valve: Trileaflet; mildly thickened, mildly calcified   leaflets. There was mild stenosis. - Aorta: Ascending aortic diameter: 42 mm (S). Mild ascending   aortic dilatation. - Mitral valve: There was mild to moderate regurgitation. - Atrial septum: No defect or patent foramen ovale was identified. - Tricuspid valve: There was mild-moderate regurgitation. - Pulmonary arteries: PA peak pressure: 33 mm Hg (S).  Antimicrobials:  None    Subjective: Palpitations. No chest pain or dyspnea. Wants to go home.  Objective: Vitals:   05/10/17 1157 05/10/17 1159 05/10/17 1200 05/10/17 1326  BP: (!) 166/98  (!) 157/104 (!) 150/108  Pulse: (!) 157 (!) 106 100   Resp:    (!) 23  Temp:    98.3 F (36.8 C)  TempSrc:    Oral  SpO2:      Weight:      Height:       No intake or output data in the 24 hours ending 05/10/17 1508 Filed Weights   05/08/17 0857 05/09/17 0552  Weight: 102.7 kg (226 lb 6.4 oz) 104.4 kg (230 lb 2.6 oz)    Examination:  General exam: Appears calm and comfortable Respiratory system: Clear to auscultation. Respiratory effort normal. Cardiovascular system: S1 & S2 heard, irregular rhythm with slight tachycardia. No  murmurs, rubs, gallops or clicks. Gastrointestinal system: Abdomen is nondistended, soft and nontender. No organomegaly or masses felt. Normal bowel sounds heard. Central nervous system: Alert and oriented. No focal  neurological deficits. Extremities: No edema. No calf tenderness Skin: No cyanosis. No rashes Psychiatry: Judgement and insight appear normal. Mood & affect appropriate.     Data Reviewed: I have personally reviewed following labs and imaging studies  CBC: Recent Labs  Lab 05/07/17 2202 05/09/17 0218 05/10/17 0310  WBC 7.1 5.5 6.4  HGB 13.9 11.5* 13.1  HCT 41.7 36.5 40.5  MCV 93.1 94.3 93.3  PLT 206 203 629   Basic Metabolic Panel: Recent Labs  Lab 05/07/17 2202 05/08/17 0206 05/08/17 1343 05/09/17 0218 05/10/17 0310  NA 136 137  --  134* 139  K 3.5 3.2*  --  3.6 3.4*  CL 99* 99*  --  97* 103  CO2 20* 24  --  23 24  GLUCOSE 127* 137*  --  140* 122*  BUN 8 9  --  22* 18  CREATININE 1.07* 1.16*  --  2.02* 1.21*  CALCIUM 9.7 9.4  --  9.4 9.8  MG  --   --  1.5* 2.5*  --    GFR: Estimated Creatinine Clearance: 49.9 mL/min (A) (by C-G formula based on SCr of 1.21 mg/dL (H)). Liver Function Tests: No results for input(s): AST, ALT, ALKPHOS, BILITOT, PROT, ALBUMIN in the last 168 hours. No results for input(s): LIPASE, AMYLASE in the last 168 hours. No results for input(s): AMMONIA in the last 168 hours. Coagulation Profile: No results for input(s): INR, PROTIME in the last 168 hours. Cardiac Enzymes: No results for input(s): CKTOTAL, CKMB, CKMBINDEX, TROPONINI in the last 168 hours. BNP (last 3 results) No results for input(s): PROBNP in the last 8760 hours. HbA1C: No results for input(s): HGBA1C in the last 72 hours. CBG: Recent Labs  Lab 05/09/17 1243 05/09/17 1752 05/09/17 2212 05/10/17 0816 05/10/17 1324  GLUCAP 140* 121* 122* 128* 183*   Lipid Profile: No results for input(s): CHOL, HDL, LDLCALC, TRIG, CHOLHDL, LDLDIRECT in the last 72 hours. Thyroid Function Tests: Recent Labs    05/08/17 0206  TSH 2.471   Anemia Panel: No results for input(s): VITAMINB12, FOLATE, FERRITIN, TIBC, IRON, RETICCTPCT in the last 72 hours. Sepsis Labs: No results  for input(s): PROCALCITON, LATICACIDVEN in the last 168 hours.  No results found for this or any previous visit (from the past 240 hour(s)).       Radiology Studies: Dg Chest 2 View  Result Date: 05/09/2017 CLINICAL DATA:  Dyspnea EXAM: CHEST - 2 VIEW COMPARISON:  05/07/2017 chest radiograph. FINDINGS: Stable cardiomediastinal silhouette with mild cardiomegaly. No pneumothorax. No pleural effusion. Cephalization of the pulmonary vasculature without overt pulmonary edema. No consolidative airspace disease. IMPRESSION: Stable mild cardiomegaly without overt pulmonary edema. No active pulmonary disease. Electronically Signed   By: Ilona Sorrel M.D.   On: 05/09/2017 20:21        Scheduled Meds: . amLODipine  5 mg Oral Daily  . anastrozole  1 mg Oral Daily  . apixaban  5 mg Oral BID  . carvedilol  6.25 mg Oral BID WC  . gabapentin  300 mg Oral QHS  . hydrALAZINE  25 mg Oral Q8H  . insulin aspart  0-5 Units Subcutaneous QHS  . insulin aspart  0-9 Units Subcutaneous TID WC  . pantoprazole  40 mg Oral Daily  . potassium chloride SA  20 mEq Oral Daily  .  rosuvastatin  10 mg Oral q1800   Continuous Infusions: . diltiazem (CARDIZEM) infusion Stopped (05/08/17 0400)     LOS: 3 days     Cordelia Poche, MD [facility] 05/10/2017, 3:08 PM Pager: [phone]  If 7PM-7AM, please contact night-coverage [email or web address] Password [email or web address] 05/10/2017, 3:08 PM

## 2017-05-10 NOTE — Progress Notes (Signed)
Pt educated about strict I&O. Sign placed on pt's room door. Hat placed over toilet.   [place]  Ladavion Savitz, RN

## 2017-05-11 LAB — BASIC METABOLIC PANEL
ANION GAP: 11 (ref 5–15)
BUN: 20 mg/dL (ref 6–20)
CALCIUM: 9.7 mg/dL (ref 8.9–10.3)
CO2: 24 mmol/L (ref 22–32)
Chloride: 102 mmol/L (ref 101–111)
Creatinine, Ser: 1.32 mg/dL — ABNORMAL HIGH (ref 0.44–1.00)
GFR, EST AFRICAN AMERICAN: 45 mL/min — AB (ref >60)
GFR, EST NON AFRICAN AMERICAN: 38 mL/min — AB (ref >60)
GLUCOSE: 125 mg/dL — AB (ref 65–99)
Potassium: 3.5 mmol/L (ref 3.5–5.1)
Sodium: 137 mmol/L (ref 135–145)

## 2017-05-11 LAB — PROTIME-INR
INR: 1.29
PROTHROMBIN TIME: 16 s — AB (ref 11.4–15.2)

## 2017-05-11 LAB — CBC
HCT: 41.5 % (ref 36.0–46.0)
Hemoglobin: 13.3 g/dL (ref 12.0–15.0)
MCH: 30.6 pg (ref 26.0–34.0)
MCHC: 32 g/dL (ref 30.0–36.0)
MCV: 95.4 fL (ref 78.0–100.0)
Platelets: 232 10*3/uL (ref 150–400)
RBC: 4.35 MIL/uL (ref 3.87–5.11)
RDW: 14.3 % (ref 11.5–15.5)
WBC: 6.3 10*3/uL (ref 4.0–10.5)

## 2017-05-11 LAB — GLUCOSE, CAPILLARY
GLUCOSE-CAPILLARY: 141 mg/dL — AB (ref 65–99)
Glucose-Capillary: 119 mg/dL — ABNORMAL HIGH (ref 65–99)
Glucose-Capillary: 137 mg/dL — ABNORMAL HIGH (ref 65–99)
Glucose-Capillary: 130 mg/dL — ABNORMAL HIGH (ref 65–99)

## 2017-05-11 LAB — HEPARIN LEVEL (UNFRACTIONATED): Heparin Unfractionated: 1 IU/mL — ABNORMAL HIGH (ref 0.30–0.70)

## 2017-05-11 LAB — APTT: aPTT: 26 seconds (ref 24–36)

## 2017-05-11 MED ORDER — SODIUM CHLORIDE 0.9 % WEIGHT BASED INFUSION
3.0000 mL/kg/h | INTRAVENOUS | Status: DC
  Administered 2017-05-12: 3 mL/kg/h via INTRAVENOUS

## 2017-05-11 MED ORDER — ASPIRIN 81 MG PO CHEW
81.0000 mg | CHEWABLE_TABLET | ORAL | Status: AC
Start: 2017-05-12 — End: 2017-05-12
  Administered 2017-05-12: 81 mg via ORAL
  Filled 2017-05-11: qty 1

## 2017-05-11 MED ORDER — SODIUM CHLORIDE 0.9 % IV SOLN: 250.0000 mL | INTRAVENOUS | Status: DC | PRN

## 2017-05-11 MED ORDER — SODIUM CHLORIDE 0.9% FLUSH
3.0000 mL | Freq: Two times a day (BID) | INTRAVENOUS | Status: DC
  Administered 2017-05-11 – 2017-05-12 (×3): 3 mL via INTRAVENOUS

## 2017-05-11 MED ORDER — ASPIRIN EC 81 MG PO TBEC
81.0000 mg | DELAYED_RELEASE_TABLET | Freq: Every day | ORAL | Status: DC
  Administered 2017-05-11 – 2017-05-13 (×3): 81 mg via ORAL
  Filled 2017-05-11 (×3): qty 1

## 2017-05-11 MED ORDER — HEPARIN (PORCINE) IN NACL 100-0.45 UNIT/ML-% IJ SOLN
1500.0000 [IU]/h | INTRAMUSCULAR | Status: DC
  Administered 2017-05-11: 1000 [IU]/h via INTRAVENOUS
  Administered 2017-05-12: 1500 [IU]/h via INTRAVENOUS
  Filled 2017-05-11 (×2): qty 250

## 2017-05-11 MED ORDER — SODIUM CHLORIDE 0.9 % WEIGHT BASED INFUSION
1.0000 mL/kg/h | INTRAVENOUS | Status: DC
  Administered 2017-05-12: 1 mL/kg/h via INTRAVENOUS

## 2017-05-11 MED ORDER — SODIUM CHLORIDE 0.9% FLUSH: 3.0000 mL | INTRAVENOUS | Status: DC | PRN

## 2017-05-11 MED ORDER — CARVEDILOL 12.5 MG PO TABS
12.5000 mg | ORAL_TABLET | Freq: Two times a day (BID) | ORAL | Status: DC
  Administered 2017-05-11 – 2017-05-12 (×3): 12.5 mg via ORAL
  Filled 2017-05-11 (×3): qty 1

## 2017-05-11 MED ORDER — CARVEDILOL 12.5 MG PO TABS: 12.5000 mg | ORAL_TABLET | Freq: Two times a day (BID) | ORAL | Status: DC

## 2017-05-11 NOTE — Progress Notes (Signed)
Bladder scanned: 0-65mL.   [place]  Nichole Keltner, RN

## 2017-05-11 NOTE — Progress Notes (Signed)
    [facility] has been requested to perform a transesophageal echocardiogram on Carrie Key for atrial fibrillation.  After careful review of history and examination, the risks and benefits of transesophageal echocardiogram have been explained including risks of esophageal damage, perforation (1:10,000 risk), bleeding, pharyngeal hematoma as well as other potential complications associated with conscious sedation including aspiration, arrhythmia, respiratory failure and death. Alternatives to treatment were discussed, questions were answered. Patient is willing to proceed.   Pt is scheduled for TEE/DCCV on Monday 05/15/17 at noon with Dr. Oval Linsey. NPO at [facility] Sunday night.  Tami Lin [place], [place]  05/11/2017 11:06 AM

## 2017-05-11 NOTE — Progress Notes (Signed)
ANTICOAGULATION CONSULT NOTE  Pharmacy Consult for heparin  Indication: atrial fibrillation  No Known Allergies  Patient Measurements: Height: 5\' 7"  (170.2 cm) Weight: 226 lb 12.8 oz (102.9 kg) IBW/kg (Calculated) : 61.6 Heparin Dosing Weight: 84kg  Vital Signs: Temp: 98.8 F (37.1 C) (04/04 1607) Temp Source: Oral (04/04 1607) BP: 138/90 (04/04 1803) Pulse Rate: 131 (04/04 1803)  Labs: Recent Labs    05/09/17 0218 05/10/17 0310 05/10/17 2022 05/11/17 0410 05/11/17 1100 05/11/17 1738  HGB 11.5* 13.1  --  13.3  --   --   HCT 36.5 40.5  --  41.5  --   --   PLT 203 229  --  232  --   --   APTT  --   --   --   --   --  26  LABPROT  --   --   --   --  16.0*  --   INR  --   --   --   --  1.29  --   CREATININE 2.02* 1.21* 1.45* 1.32*  --   --     Estimated Creatinine Clearance: 45.4 mL/min (A) (by C-G formula based on SCr of 1.32 mg/dL (H)).   Assessment: 77 yo female with CP, abnormal nuclear study and afib with plans for cath. She is on apixiban with last dose 4/3 at ~ 9:30am. Pharmacy consulted to dose heparin.   Initial aPTT is low at 26 seconds (not relying on heparin levels given effect of DOACs on results). No bleeding noted.  Goal of Therapy:  Heparin level 0.3-0.7 units/ml aPTT 66-102 seconds Monitor platelets by anticoagulation protocol: Yes   Plan:  -No heparin bolus due to recent apixiban -Increase heparin to 1200 units/hr -aPTT in 8h  Jylan Loeza D. Maycee Blasco, PharmD, [place] Clinical Pharmacist [phone] 05/11/2017 7:02 PM

## 2017-05-11 NOTE — Progress Notes (Signed)
Consent form filled out but not yet signed by pt/witness; placed in chart until someone can further educate pt about procedure; pt states she does not remember being talked to about the cath for tomorrow.   [place]  [place]ton Nunley, RN

## 2017-05-11 NOTE — Progress Notes (Signed)
Contacted PA as pt's HR 160 sitting at bedside getting ready for breakfast; only way to dec HR some is to have her lay back in bed (then 110-130s); ... PA called back and rescheduled morning dose.   [place]  Bria Sparr, RN

## 2017-05-11 NOTE — Progress Notes (Signed)
ANTICOAGULATION CONSULT NOTE - Initial Consult  Pharmacy Consult for heparin  Indication: atrial fibrillation  No Known Allergies  Patient Measurements: Height: 5\' 7"  (170.2 cm) Weight: 224 lb 3.2 oz (101.7 kg) IBW/kg (Calculated) : 61.6 Heparin Dosing Weight: 84kg  Vital Signs: Temp: 98.5 F (36.9 C) (04/04 0802) Temp Source: Oral (04/04 0802) BP: 148/84 (04/04 0802) Pulse Rate: 108 (04/04 0830)  Labs: Recent Labs    05/09/17 0218 05/10/17 0310 05/10/17 2022 05/11/17 0410  HGB 11.5* 13.1  --  13.3  HCT 36.5 40.5  --  41.5  PLT 203 229  --  232  CREATININE 2.02* 1.21* 1.45* 1.32*    Estimated Creatinine Clearance: 45.1 mL/min (A) (by C-G formula based on SCr of 1.32 mg/dL (H)).   Medical History: Past Medical History:  Diagnosis Date  . Atrial fibrillation ([place])   . Atrial fibrillation with RVR ([place]) 05/08/2017  . Breast cancer ([place])   . Cancer ([place])   . CKD (chronic kidney disease), stage III ([place]) 07/10/2014  . Diabetes mellitus without complication ([place])   . Dyspnea   . GERD (gastroesophageal reflux disease)   . Heart murmur   . Hyperlipemia   . Hypertension   . Wears glasses     Medications:  Medications Prior to Admission  Medication Sig Dispense Refill Last Dose  . albuterol (PROVENTIL HFA;VENTOLIN HFA) 108 (90 Base) MCG/ACT inhaler Inhale 1-2 puffs into the lungs every 6 (six) hours as needed for wheezing or shortness of breath.   LF 03-27-17 at 28DS  . amLODipine (NORVASC) 5 MG tablet Take 1 tablet (5 mg total) by mouth daily. 180 tablet 3 05/06/2017 at Unknown time  . anastrozole (ARIMIDEX) 1 MG tablet Take 1 tablet (1 mg total) by mouth daily. 30 tablet 11 LF 05-05-17 at 30DS  . apixaban (ELIQUIS) 5 MG TABS tablet Take 1 tablet (5 mg total) by mouth 2 (two) times daily. 180 tablet 3 05/03/2017  . cloNIDine (CATAPRES) 0.3 MG tablet Take 1 tablet (0.3 mg total) by mouth 2 (two) times daily. MUST GET REFILLS WITH PCP 90 tablet 0 05/07/2017 at Unknown time   . gabapentin (NEURONTIN) 300 MG capsule Take 1 capsule (300 mg total) by mouth at bedtime. 30 capsule 6 LF 04-10-17 at 30DS  . glimepiride (AMARYL) 1 MG tablet Take 1 mg by mouth daily with breakfast.   LF 01-28-17 at 90DS  . pravastatin (PRAVACHOL) 10 MG tablet Take 10 mg by mouth daily.   LF 04-10-17 at 90DS  . carvedilol (COREG) 3.125 MG tablet Take 1 tablet (3.125 mg total) by mouth 2 (two) times daily. (Patient not taking: Reported on 08/19/2016) 180 tablet 3 Not Taking  . meclizine (ANTIVERT) 12.5 MG tablet Take 1 tablet (12.5 mg total) by mouth 3 (three) times daily as needed for dizziness. (Patient not taking: Reported on 05/08/2017) 20 tablet 0 Not Taking at Unknown time  . omeprazole (PRILOSEC) 40 MG capsule Take 40 mg by mouth daily.    Not Taking at Unknown time  . potassium chloride SA (K-DUR,KLOR-CON) 20 MEQ tablet Take 1 tablet (20 mEq total) by mouth daily. (Patient not taking: Reported on 05/08/2017) 30 tablet 3 Not Taking at Unknown time  . rosuvastatin (CRESTOR) 10 MG tablet Take 10 mg by mouth daily.   Not Taking at Unknown time  . traMADol (ULTRAM) 50 MG tablet Take 1 tablet (50 mg total) by mouth every 6 (six) hours as needed. for pain (Patient not taking: Reported on 05/08/2017) 90 tablet  0 Not Taking at Unknown time   Scheduled:  . amLODipine  5 mg Oral Daily  . anastrozole  1 mg Oral Daily  . aspirin EC  81 mg Oral Daily  . carvedilol  12.5 mg Oral BID WC  . gabapentin  300 mg Oral QHS  . hydrALAZINE  25 mg Oral Q8H  . insulin aspart  0-5 Units Subcutaneous QHS  . insulin aspart  0-9 Units Subcutaneous TID WC  . pantoprazole  40 mg Oral Daily  . potassium chloride SA  20 mEq Oral Daily  . rosuvastatin  10 mg Oral q1800    Assessment: 77 yo female with CP, abnormal nuclear study and afib with plans for cath. She is on apixiban with last dose 4/3 at ~ 9:30am. Pharmacy consulted to dose heparin.   Goal of Therapy:  Heparin level 0.3-0.7 units/ml aPTT 66-102 seconds Monitor  platelets by anticoagulation protocol: Yes   Plan:  -No heparin bolus due to recent apixiban -Start heparin at 1000 units/hr -Heparin level in 8 hours and daily wth CBC daily  Hildred Laser, PharmD Clinical Pharmacist Clinical phone from 8:30-4:00 is [phone] After 4pm, please call Main Rx [phone]) for assistance. 05/11/2017 9:02 AM

## 2017-05-11 NOTE — Progress Notes (Signed)
Progress Note  Patient Name: Carrie Key Date of Encounter: 05/11/2017  Primary Cardiologist: Dr Stanford Breed  Subjective   No dyspnea; mild chest tightness earlier improved with o2  Inpatient Medications    Scheduled Meds: . amLODipine  5 mg Oral Daily  . anastrozole  1 mg Oral Daily  . carvedilol  6.25 mg Oral BID WC  . gabapentin  300 mg Oral QHS  . hydrALAZINE  25 mg Oral Q8H  . insulin aspart  0-5 Units Subcutaneous QHS  . insulin aspart  0-9 Units Subcutaneous TID WC  . pantoprazole  40 mg Oral Daily  . potassium chloride SA  20 mEq Oral Daily  . rosuvastatin  10 mg Oral q1800   Continuous Infusions: . sodium chloride    . diltiazem (CARDIZEM) infusion Stopped (05/08/17 0400)   PRN Meds: acetaminophen, ALPRAZolam, hydrALAZINE, meclizine, menthol-cetylpyridinium, ondansetron (ZOFRAN) IV, traMADol, zolpidem   Vital Signs    Vitals:   05/10/17 1918 05/10/17 2337 05/11/17 0328 05/11/17 0553  BP: (!) 137/96 (!) 151/93 (!) 133/113   Pulse:      Resp: (!) 26 19 14    Temp: 98 F (36.7 C) 98.6 F (37 C) 98 F (36.7 C)   TempSrc: Oral  Oral   SpO2: 99% 99% 95%   Weight:    224 lb 3.2 oz (101.7 kg)  Height:        Intake/Output Summary (Last 24 hours) at 05/11/2017 0750 Last data filed at 05/10/2017 1838 Gross per 24 hour  Intake 510 ml  Output 50 ml  Net 460 ml   Filed Weights   05/08/17 0857 05/09/17 0552 05/11/17 0553  Weight: 226 lb 6.4 oz (102.7 kg) 230 lb 2.6 oz (104.4 kg) 224 lb 3.2 oz (101.7 kg)    Telemetry    Atrial fibrillation with upper normal rate - Personally Reviewed   Physical Exam   GEN: No acute distress.   Neck: No JVD Cardiac: irregular and mildly tachycardic Respiratory: Clear to auscultation bilaterally. GI: Soft, nontender, non-distended  MS: No edema Neuro:  Nonfocal  Psych: Normal affect   Labs    Chemistry Recent Labs  Lab 05/10/17 0310 05/10/17 2022 05/11/17 0410  NA 139 138 137  K 3.4* 3.7 3.5  CL 103 102 102    CO2 24 23 24   GLUCOSE 122* 143* 125*  BUN 18 17 20   CREATININE 1.21* 1.45* 1.32*  CALCIUM 9.8 10.0 9.7  GFRNONAA 43* 34* 38*  GFRAA 49* 40* 45*  ANIONGAP 12 13 11      Hematology Recent Labs  Lab 05/09/17 0218 05/10/17 0310 05/11/17 0410  WBC 5.5 6.4 6.3  RBC 3.87 4.34 4.35  HGB 11.5* 13.1 13.3  HCT 36.5 40.5 41.5  MCV 94.3 93.3 95.4  MCH 29.7 30.2 30.6  MCHC 31.5 32.3 32.0  RDW 13.9 13.7 14.3  PLT 203 229 232     Recent Labs  Lab 05/07/17 2211  TROPIPOC 0.04     BNP Recent Labs  Lab 05/07/17 2206  BNP 405.0*     DDimer  Recent Labs  Lab 05/09/17 1032  DDIMER 3.40*     Radiology    Dg Chest 2 View  Result Date: 05/09/2017 CLINICAL DATA:  Dyspnea EXAM: CHEST - 2 VIEW COMPARISON:  05/07/2017 chest radiograph. FINDINGS: Stable cardiomediastinal silhouette with mild cardiomegaly. No pneumothorax. No pleural effusion. Cephalization of the pulmonary vasculature without overt pulmonary edema. No consolidative airspace disease. IMPRESSION: Stable mild cardiomegaly without overt pulmonary edema. No active  pulmonary disease. Electronically Signed   By: Ilona Sorrel M.D.   On: 05/09/2017 20:21   Nm Myocar Multi W/spect W/wall Motion / Ef  Result Date: 05/10/2017  There was no ST segment deviation noted during stress.  No T wave inversion was noted during stress.  The left ventricular ejection fraction is moderately decreased (30-44%).  Nuclear stress EF: 42%. There is apical septal wall akinesis.  Defect 1: There is a medium defect of mild severity present in the apex location.  Findings consistent with prior myocardial infarction with peri-infarct ischemia.  This is an intermediate risk study.  Candee Furbish, MD    Patient Profile     77 y.o. female 77 year old female with past medical history of paroxysmal atrial fibrillation, hypertension, hyperlipidemia, chronic stage III kidney disease, diabetes mellitus, history of breast cancer with recurrent atrial  fibrillation and atypical chest pain. Echocardiogram shows normal LV function, mild aortic stenosis, mildly dilated aortic root, mild to moderate mitral and tricuspid regurgitation. Electrocardiogram shows atrial fibrillation with increased anterior T wave inversion. Nuclear study shows EF 42, small partially reversible apical defect.    Assessment & Plan    1 persistent atrial fibrillation-patient remains in atrial fibrillation this morning.  Apixaban held for cath in AM; will add IV heparin for now; resume apixaban following procedure. Increase coreg to 12.5 mg BID for improved rate control. Can reschedule TEE/DCCV Monday following cath.  2 Atypical CP, EKG changes, abnormal nuclear study-patient noted to have new anterior T wave inversion on electrocardiogram.  Nuclear study with mild apical ischemia. Plan cath in AM; risks and benefits including MI, CVA and death discussed and pt agrees to proceed.  3 hypertension-Blood pressure is mildly elevated; increase coreg to 12.5 mg BID and follow.  4acute on chronic stage III kidney disease-follow renal function following procedure.  5Acute on chronic diastolic congestive heart failure-continue to hold diuretics prior to cath.  6 elevated d-dimer-Can proceed with VQ scan in 24 hrs.    For questions or updates, please contact [place] Please consult [email or web address] for contact info under Cardiology/STEMI.      Signed, Kirk Ruths, MD  05/11/2017, 7:50 AM

## 2017-05-11 NOTE — Progress Notes (Addendum)
PROGRESS NOTE    Carrie Key  UXN:[id number] DOB: 08/08/1940 DOA: 05/07/2017 PCP: Glendale Chard, MD   Brief Narrative: Carrie Key is a 77 y.o. female with A. fib on Eliquis, right breast CA status post resection, CKD stage III, chronic diastolic CHF, hypertension, diabetes mellitus. She presented secondary to palpitations and found to be in atrial fibrillation with RVR.   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 HR uncontrolled. On Eliquis as an outpatient. -Cardiology recommendations: DCCV on hold secondary to plans for cardiac cath on 4/5. Increased Coreg. Discontinued Eliquis and started Heparin IV  Acute on chronic diastolic heart failure Improved. In setting of atrial fibrillation with RVR. Weight 226 lbs on admission. 230 lbs today. No urine output measured. -Cardiology recommendations: Holding diuretics  EKG with T-wave inversions Stress test intermediate -Cardiology recommendations: cardiac cath on 4/5  Essential hypertension Elevated -Continue coreg (increased) -Cardiology recommendations: hydralazine 25 mg TID added  Breast cancer Outpatient follow-up with oncology -Continue Arimidex  CKD stage 3 Baseline creatinine.  Diabetes mellitus, typpe 2 Well controlled. -Continue SSI  Elevated D-dimer V/Q scan 24-48 hours after nuclear study. No chest pain or dyspnea. Low suspicion for PE. No unilateral leg swelling concerning for DVT. Wells PE score of 1.5 (low risk) and Wells DVT score of 0  Hyperlipidemia -Continue Crestor  No  active general medical problems. Discussed with Cardiology, and okay to transfer to their service for continued medical care. Hospitalist services will sign off, but please will be available for re-consult.   DVT prophylaxis: Eliquis Code Status:   Code Status: Full Code Family Communication: None at bedside Disposition Plan: Discharge pending cardiology recommendations/management   Consultants:   Cardiology  Procedures:   Transthoracic Echocardiogram (05/08/17) Study Conclusions  - Left ventricle: The cavity size was normal. Wall thickness was   increased in a pattern of moderate LVH. Systolic function was   normal. The estimated ejection fraction was in the range of 55%   to 60%. Wall motion was normal; there were no regional wall   motion abnormalities. The study was not technically sufficient to   allow evaluation of LV diastolic dysfunction due to atrial   fibrillation. - Aortic valve: Trileaflet; mildly thickened, mildly calcified   leaflets. There was mild stenosis. - Aorta: Ascending aortic diameter: 42 mm (S). Mild ascending   aortic dilatation. - Mitral valve: There was mild to moderate regurgitation. - Atrial septum: No defect or patent foramen ovale was identified. - Tricuspid valve: There was mild-moderate regurgitation. - Pulmonary arteries: PA peak pressure: 33 mm Hg (S).  Antimicrobials:  None    Subjective: Continues to have palpitations. No chest pain or dyspnea. No leg swelling.  Objective: Vitals:   05/10/17 2337 05/11/17 0328 05/11/17 0553 05/11/17 0802  BP: (!) 151/93 (!) 133/113  (!) 148/84  Pulse:      Resp: 19 14    Temp: 98.6 F (37 C) 98 F (36.7 C)    TempSrc:  Oral    SpO2: 99% 95%    Weight:   101.7 kg (224 lb 3.2 oz)   Height:  Intake/Output Summary (Last 24 hours) at 05/11/2017 0823 Last data filed at 05/10/2017 1838 Gross per 24 hour  Intake 510 ml  Output 50 ml  Net 460 ml   Filed Weights   05/08/17 0857 05/09/17 0552  05/11/17 0553  Weight: 102.7 kg (226 lb 6.4 oz) 104.4 kg (230 lb 2.6 oz) 101.7 kg (224 lb 3.2 oz)    Examination:  General exam: Appears calm and comfortable Respiratory: Clear to auscultation bilaterally. Unlabored work of breathing. No wheezing or rales. Cardiovascular system: Fast rate and irregular rhythm. Normal S1 and S2. No heart murmurs present. No extra heart sounds Gastrointestinal system: Abdomen is nondistended, soft and nontender. No organomegaly or masses felt. Normal bowel sounds heard. Central nervous system: Alert and oriented. No focal neurological deficits. Extremities: No edema. No calf tenderness Skin: No cyanosis. No rashes Psychiatry: Judgement and insight appear normal. Mood & affect appropriate.     Data Reviewed: I have personally reviewed following labs and imaging studies  CBC: Recent Labs  Lab 05/07/17 2202 05/09/17 0218 05/10/17 0310 05/11/17 0410  WBC 7.1 5.5 6.4 6.3  HGB 13.9 11.5* 13.1 13.3  HCT 41.7 36.5 40.5 41.5  MCV 93.1 94.3 93.3 95.4  PLT 206 203 229 540   Basic Metabolic Panel: Recent Labs  Lab 05/08/17 0206 05/08/17 1343 05/09/17 0218 05/10/17 0310 05/10/17 2022 05/11/17 0410  NA 137  --  134* 139 138 137  K 3.2*  --  3.6 3.4* 3.7 3.5  CL 99*  --  97* 103 102 102  CO2 24  --  23 24 23 24   GLUCOSE 137*  --  140* 122* 143* 125*  BUN 9  --  22* 18 17 20   CREATININE 1.16*  --  2.02* 1.21* 1.45* 1.32*  CALCIUM 9.4  --  9.4 9.8 10.0 9.7  MG  --  1.5* 2.5*  --   --   --    GFR: Estimated Creatinine Clearance: 45.1 mL/min (A) (by C-G formula based on SCr of 1.32 mg/dL (H)). Liver Function Tests: No results for input(s): AST, ALT, ALKPHOS, BILITOT, PROT, ALBUMIN in the last 168 hours. No results for input(s): LIPASE, AMYLASE in the last 168 hours. No results for input(s): AMMONIA in the last 168 hours. Coagulation Profile: No results for input(s): INR, PROTIME in the last 168 hours. Cardiac Enzymes: No results for input(s):  CKTOTAL, CKMB, CKMBINDEX, TROPONINI in the last 168 hours. BNP (last 3 results) No results for input(s): PROBNP in the last 8760 hours. HbA1C: No results for input(s): HGBA1C in the last 72 hours. CBG: Recent Labs  Lab 05/09/17 2212 05/10/17 0816 05/10/17 1324 05/10/17 1702 05/10/17 2117  GLUCAP 122* 128* 183* 116* 131*   Lipid Profile: No results for input(s): CHOL, HDL, LDLCALC, TRIG, CHOLHDL, LDLDIRECT in the last 72 hours. Thyroid Function Tests: No results for input(s): TSH, T4TOTAL, FREET4, T3FREE, THYROIDAB in the last 72 hours. Anemia Panel: No results for input(s): VITAMINB12, FOLATE, FERRITIN, TIBC, IRON, RETICCTPCT in the last 72 hours. Sepsis Labs: No results for input(s): PROCALCITON, LATICACIDVEN in the last 168 hours.  Recent Results (from the past 240 hour(s))  MRSA PCR Screening     Status: None   Collection Time: 05/10/17 12:04 PM  Result Value Ref Range Status   MRSA by PCR NEGATIVE NEGATIVE Final    Comment:        The GeneXpert MRSA Assay (FDA approved for NASAL specimens only), is one component of a comprehensive MRSA colonization surveillance program.  It is not intended to diagnose MRSA infection nor to guide or monitor treatment for MRSA infections. Performed at [facility], [place] [address], [place], [place] [address]          Radiology Studies: Dg Chest 2 View  Result Date: 05/09/2017 CLINICAL DATA:  Dyspnea EXAM: CHEST - 2 VIEW COMPARISON:  05/07/2017 chest radiograph. FINDINGS: Stable cardiomediastinal silhouette with mild cardiomegaly. No pneumothorax. No pleural effusion. Cephalization of the pulmonary vasculature without overt pulmonary edema. No consolidative airspace disease. IMPRESSION: Stable mild cardiomegaly without overt pulmonary edema. No active pulmonary disease. Electronically Signed   By: Ilona Sorrel M.D.   On: 05/09/2017 20:21   Nm Myocar Multi W/spect W/wall Motion / Ef  Result Date: 05/10/2017  There was no ST  segment deviation noted during stress.  No T wave inversion was noted during stress.  The left ventricular ejection fraction is moderately decreased (30-44%).  Nuclear stress EF: 42%. There is apical septal wall akinesis.  Defect 1: There is a medium defect of mild severity present in the apex location.  Findings consistent with prior myocardial infarction with peri-infarct ischemia.  This is an intermediate risk study.  Candee Furbish, MD        Scheduled Meds: . amLODipine  5 mg Oral Daily  . anastrozole  1 mg Oral Daily  . aspirin EC  81 mg Oral Daily  . carvedilol  12.5 mg Oral BID WC  . gabapentin  300 mg Oral QHS  . hydrALAZINE  25 mg Oral Q8H  . insulin aspart  0-5 Units Subcutaneous QHS  . insulin aspart  0-9 Units Subcutaneous TID WC  . pantoprazole  40 mg Oral Daily  . potassium chloride SA  20 mEq Oral Daily  . rosuvastatin  10 mg Oral q1800   Continuous Infusions: . sodium chloride       LOS: 4 days     Cordelia Poche, MD [facility] 05/11/2017, 8:23 AM Pager: [phone]  If 7PM-7AM, please contact night-coverage [email or web address] Password [facility] 05/11/2017, 8:23 AM

## 2017-05-12 ENCOUNTER — Inpatient Hospital Stay: Payer: Self-pay

## 2017-05-12 ENCOUNTER — Inpatient Hospital Stay (HOSPITAL_COMMUNITY)
Admission: EM | Disposition: A | Discharge status: Home / Self Care | Payer: Self-pay | Source: Home / Self Care | Attending: Cardiology

## 2017-05-12 DIAGNOSIS — I2510 Atherosclerotic heart disease of native coronary artery without angina pectoris: Secondary | ICD-10-CM

## 2017-05-12 DIAGNOSIS — R9439 Abnormal result of other cardiovascular function study: Secondary | ICD-10-CM | POA: Clinically undetermined

## 2017-05-12 HISTORY — PX: LEFT HEART CATH AND CORONARY ANGIOGRAPHY: CATH118249

## 2017-05-12 LAB — CBC
HEMATOCRIT: 40.5 % (ref 36.0–46.0)
Hemoglobin: 12.9 g/dL (ref 12.0–15.0)
MCH: 30.4 pg (ref 26.0–34.0)
MCHC: 31.9 g/dL (ref 30.0–36.0)
MCV: 95.3 fL (ref 78.0–100.0)
PLATELETS: 215 10*3/uL (ref 150–400)
RBC: 4.25 MIL/uL (ref 3.87–5.11)
RDW: 14.2 % (ref 11.5–15.5)
WBC: 6.9 10*3/uL (ref 4.0–10.5)

## 2017-05-12 LAB — BASIC METABOLIC PANEL
Anion gap: 15 (ref 5–15)
BUN: 19 mg/dL (ref 6–20)
CHLORIDE: 102 mmol/L (ref 101–111)
CO2: 19 mmol/L — ABNORMAL LOW (ref 22–32)
Calcium: 9.6 mg/dL (ref 8.9–10.3)
Creatinine, Ser: 1.21 mg/dL — ABNORMAL HIGH (ref 0.44–1.00)
GFR, EST AFRICAN AMERICAN: 49 mL/min — AB (ref >60)
GFR, EST NON AFRICAN AMERICAN: 43 mL/min — AB (ref >60)
Glucose, Bld: 120 mg/dL — ABNORMAL HIGH (ref 65–99)
POTASSIUM: 3.6 mmol/L (ref 3.5–5.1)
SODIUM: 136 mmol/L (ref 135–145)

## 2017-05-12 LAB — HEPARIN LEVEL (UNFRACTIONATED): Heparin Unfractionated: 0.58 IU/mL (ref 0.30–0.70)

## 2017-05-12 LAB — GLUCOSE, CAPILLARY
GLUCOSE-CAPILLARY: 133 mg/dL — AB (ref 65–99)
GLUCOSE-CAPILLARY: 113 mg/dL — AB (ref 65–99)
GLUCOSE-CAPILLARY: 112 mg/dL — AB (ref 65–99)

## 2017-05-12 LAB — POCT ACTIVATED CLOTTING TIME: Activated Clotting Time: 153 seconds

## 2017-05-12 LAB — APTT: aPTT: 44 seconds — ABNORMAL HIGH (ref 24–36)

## 2017-05-12 SURGERY — LEFT HEART CATH AND CORONARY ANGIOGRAPHY
Anesthesia: LOCAL

## 2017-05-12 MED ORDER — HEPARIN (PORCINE) IN NACL 2-0.9 UNIT/ML-% IJ SOLN
INTRAMUSCULAR | Status: AC
  Filled 2017-05-12: qty 500

## 2017-05-12 MED ORDER — LIDOCAINE HCL (PF) 1 % IJ SOLN
INTRAMUSCULAR | Status: DC | PRN
  Administered 2017-05-12: 18 mL
  Administered 2017-05-12: 2 mL

## 2017-05-12 MED ORDER — IOHEXOL 350 MG/ML SOLN
INTRAVENOUS | Status: DC | PRN
  Administered 2017-05-12: 100 mL

## 2017-05-12 MED ORDER — SODIUM CHLORIDE 0.9 % WEIGHT BASED INFUSION: 1.0000 mL/kg/h | INTRAVENOUS | Status: DC

## 2017-05-12 MED ORDER — MIDAZOLAM HCL 2 MG/2ML IJ SOLN
INTRAMUSCULAR | Status: DC | PRN
  Administered 2017-05-12: 1 mg via INTRAVENOUS

## 2017-05-12 MED ORDER — FENTANYL CITRATE (PF) 100 MCG/2ML IJ SOLN
INTRAMUSCULAR | Status: DC | PRN
  Administered 2017-05-12: 25 ug via INTRAVENOUS

## 2017-05-12 MED ORDER — SODIUM CHLORIDE 0.9% FLUSH
3.0000 mL | Freq: Two times a day (BID) | INTRAVENOUS | Status: DC
  Administered 2017-05-12: 3 mL via INTRAVENOUS

## 2017-05-12 MED ORDER — VERAPAMIL HCL 2.5 MG/ML IV SOLN
INTRAVENOUS | Status: DC | PRN
  Administered 2017-05-12: 10 mL via INTRA_ARTERIAL

## 2017-05-12 MED ORDER — HEPARIN SODIUM (PORCINE) 1000 UNIT/ML IJ SOLN
INTRAMUSCULAR | Status: AC
  Filled 2017-05-12: qty 1

## 2017-05-12 MED ORDER — SODIUM CHLORIDE 0.9% FLUSH
3.0000 mL | Freq: Two times a day (BID) | INTRAVENOUS | Status: DC
  Administered 2017-05-12 – 2017-05-16 (×7): 3 mL via INTRAVENOUS

## 2017-05-12 MED ORDER — FENTANYL CITRATE (PF) 100 MCG/2ML IJ SOLN
INTRAMUSCULAR | Status: AC
  Filled 2017-05-12: qty 2

## 2017-05-12 MED ORDER — SODIUM CHLORIDE 0.9 % WEIGHT BASED INFUSION: 3.0000 mL/kg/h | INTRAVENOUS | Status: DC

## 2017-05-12 MED ORDER — FENTANYL CITRATE (PF) 100 MCG/2ML IJ SOLN
25.0000 ug | Freq: Once | INTRAMUSCULAR | Status: AC
  Administered 2017-05-12: 25 ug via INTRAVENOUS

## 2017-05-12 MED ORDER — LIDOCAINE HCL 1 % IJ SOLN
INTRAMUSCULAR | Status: AC
  Filled 2017-05-12: qty 40

## 2017-05-12 MED ORDER — MIDAZOLAM HCL 2 MG/2ML IJ SOLN
INTRAMUSCULAR | Status: AC
  Filled 2017-05-12: qty 2

## 2017-05-12 MED ORDER — SODIUM CHLORIDE 0.9 % IV SOLN: 250.0000 mL | INTRAVENOUS | Status: DC

## 2017-05-12 MED ORDER — SODIUM CHLORIDE 0.9% FLUSH: 3.0000 mL | INTRAVENOUS | Status: DC | PRN

## 2017-05-12 MED ORDER — APIXABAN 5 MG PO TABS
5.0000 mg | ORAL_TABLET | Freq: Two times a day (BID) | ORAL | Status: DC
  Administered 2017-05-13 – 2017-05-16 (×7): 5 mg via ORAL
  Filled 2017-05-12 (×7): qty 1

## 2017-05-12 MED ORDER — SODIUM CHLORIDE 0.9 % IV SOLN: INTRAVENOUS | Status: AC

## 2017-05-12 MED ORDER — ASPIRIN 81 MG PO CHEW: 81.0000 mg | CHEWABLE_TABLET | ORAL | Status: DC

## 2017-05-12 MED ORDER — SODIUM CHLORIDE 0.9 % IV SOLN
INTRAVENOUS | Status: DC
  Administered 2017-05-12: 07:00:00 via INTRAVENOUS

## 2017-05-12 MED ORDER — HYDRALAZINE HCL 20 MG/ML IJ SOLN
INTRAMUSCULAR | Status: AC
  Filled 2017-05-12: qty 1

## 2017-05-12 MED ORDER — CARVEDILOL 25 MG PO TABS
25.0000 mg | ORAL_TABLET | Freq: Two times a day (BID) | ORAL | Status: DC
  Administered 2017-05-13 – 2017-05-16 (×7): 25 mg via ORAL
  Filled 2017-05-12 (×8): qty 1

## 2017-05-12 MED ORDER — VERAPAMIL HCL 2.5 MG/ML IV SOLN
INTRAVENOUS | Status: AC
  Filled 2017-05-12: qty 2

## 2017-05-12 MED ORDER — HEPARIN (PORCINE) IN NACL 2-0.9 UNIT/ML-% IJ SOLN
INTRAMUSCULAR | Status: AC | PRN
  Administered 2017-05-12 (×2): 500 mL

## 2017-05-12 MED ORDER — SODIUM CHLORIDE 0.9 % IV SOLN: 250.0000 mL | INTRAVENOUS | Status: DC | PRN

## 2017-05-12 MED ORDER — HYDROCORTISONE 1 % EX CREA: 1.0000 "application " | TOPICAL_CREAM | Freq: Three times a day (TID) | CUTANEOUS | Status: DC | PRN

## 2017-05-12 SURGICAL SUPPLY — 19 items
BAND CMPR LRG ZPHR (HEMOSTASIS) ×1
BAND ZEPHYR COMPRESS 30 LONG (HEMOSTASIS) ×1 IMPLANT
CATH INFINITI 5 FR 3DRC (CATHETERS) ×1 IMPLANT
CATH INFINITI 5 FR JL3.5 (CATHETERS) ×1 IMPLANT
CATH INFINITI 5FR AL1 (CATHETERS) ×1 IMPLANT
CATH INFINITI 5FR JL4 (CATHETERS) ×1 IMPLANT
CATH INFINITI JR4 5F (CATHETERS) ×1 IMPLANT
GLIDESHEATH SLEND A-KIT 6F 22G (SHEATH) ×1 IMPLANT
GUIDEWIRE ANGLED .035X150CM (WIRE) ×1 IMPLANT
GUIDEWIRE INQWIRE 1.5J.035X260 (WIRE) IMPLANT
HOVERMATT SINGLE USE (MISCELLANEOUS) ×1 IMPLANT
INQWIRE 1.5J .035X260CM (WIRE) ×2
KIT HEART LEFT (KITS) ×2 IMPLANT
PACK CARDIAC CATHETERIZATION (CUSTOM PROCEDURE TRAY) ×2 IMPLANT
SHEATH AVANTI 11CM 5FR (SHEATH) ×1 IMPLANT
TRANSDUCER W/STOPCOCK (MISCELLANEOUS) ×2 IMPLANT
TUBING CIL FLEX 10 FLL-RA (TUBING) ×2 IMPLANT
WIRE EMERALD 3MM-J .025X260CM (WIRE) ×1 IMPLANT
WIRE EMERALD 3MM-J .035X150CM (WIRE) ×1 IMPLANT

## 2017-05-12 NOTE — Progress Notes (Signed)
Patient is using bedside commode at this time, RN will update consult when ready.

## 2017-05-12 NOTE — Progress Notes (Signed)
Progress Note  Patient Name: Carrie Key Date of Encounter: 05/12/2017  Primary Cardiologist: Dr Stanford Breed  Subjective   Mild dyspnea and chest "sore"  Inpatient Medications    Scheduled Meds: . amLODipine  5 mg Oral Daily  . anastrozole  1 mg Oral Daily  . [START ON 05/13/2017] aspirin  81 mg Oral Pre-Cath  . aspirin EC  81 mg Oral Daily  . carvedilol  12.5 mg Oral BID WC  . gabapentin  300 mg Oral QHS  . hydrALAZINE  25 mg Oral Q8H  . insulin aspart  0-5 Units Subcutaneous QHS  . insulin aspart  0-9 Units Subcutaneous TID WC  . pantoprazole  40 mg Oral Daily  . potassium chloride SA  20 mEq Oral Daily  . rosuvastatin  10 mg Oral q1800  . sodium chloride flush  3 mL Intravenous Q12H  . sodium chloride flush  3 mL Intravenous Q12H  . sodium chloride flush  3 mL Intravenous Q12H   Continuous Infusions: . sodium chloride 20 mL/hr at 05/12/17 0300  . sodium chloride    . sodium chloride    . sodium chloride 20 mL/hr at 05/12/17 0651  . sodium chloride    . sodium chloride Stopped (05/12/17 0650)  . [START ON 05/13/2017] sodium chloride     Followed by  . [START ON 05/13/2017] sodium chloride    . heparin 1,500 Units/hr (05/12/17 0703)   PRN Meds: sodium chloride, sodium chloride, acetaminophen, ALPRAZolam, hydrALAZINE, hydrocortisone cream, meclizine, menthol-cetylpyridinium, ondansetron (ZOFRAN) IV, sodium chloride flush, sodium chloride flush, sodium chloride flush, traMADol, zolpidem   Vital Signs    Vitals:   05/12/17 0500 05/12/17 0652 05/12/17 0717 05/12/17 0720  BP:  (!) 161/103 (!) 199/114 (!) 191/96  Pulse:  91    Resp:   13 18  Temp:  98.4 F (36.9 C)    TempSrc:  Oral    SpO2:  96%    Weight: 228 lb 9.9 oz (103.7 kg)     Height:        Intake/Output Summary (Last 24 hours) at 05/12/2017 0805 Last data filed at 05/12/2017 0658 Gross per 24 hour  Intake 917.69 ml  Output 575 ml  Net 342.69 ml   Filed Weights   05/11/17 0553 05/11/17 1607 05/12/17  0500  Weight: 224 lb 3.2 oz (101.7 kg) 226 lb 12.8 oz (102.9 kg) 228 lb 9.9 oz (103.7 kg)    Telemetry    Atrial fibrillation with intermittent elevated rate - Personally Reviewed   Physical Exam   GEN: WD obese No acute distress.   Neck: No JVD, supple Cardiac: irregular and mildly tachycardic, no murmur Respiratory: Clear to auscultation bilaterally; no wheeze. GI: Soft, nontender, non-distended, no masses  MS: No edema Neuro:  Grossly intact   Labs    Chemistry Recent Labs  Lab 05/10/17 2022 05/11/17 0410 05/12/17 0323  NA 138 137 136  K 3.7 3.5 3.6  CL 102 102 102  CO2 23 24 19*  GLUCOSE 143* 125* 120*  BUN 17 20 19   CREATININE 1.45* 1.32* 1.21*  CALCIUM 10.0 9.7 9.6  GFRNONAA 34* 38* 43*  GFRAA 40* 45* 49*  ANIONGAP 13 11 15      Hematology Recent Labs  Lab 05/10/17 0310 05/11/17 0410 05/12/17 0323  WBC 6.4 6.3 6.9  RBC 4.34 4.35 4.25  HGB 13.1 13.3 12.9  HCT 40.5 41.5 40.5  MCV 93.3 95.4 95.3  MCH 30.2 30.6 30.4  MCHC 32.3 32.0 31.9  RDW 13.7 14.3 14.2  PLT 229 232 215     Recent Labs  Lab 05/07/17 2211  TROPIPOC 0.04     BNP Recent Labs  Lab 05/07/17 2206  BNP 405.0*     DDimer  Recent Labs  Lab 05/09/17 1032  DDIMER 3.40*     Radiology    Nm Myocar Multi W/spect W/wall Motion / Ef  Result Date: 05/10/2017  There was no ST segment deviation noted during stress.  No T wave inversion was noted during stress.  The left ventricular ejection fraction is moderately decreased (30-44%).  Nuclear stress EF: 42%. There is apical septal wall akinesis.  Defect 1: There is a medium defect of mild severity present in the apex location.  Findings consistent with prior myocardial infarction with peri-infarct ischemia.  This is an intermediate risk study.  Candee Furbish, MD    Patient Profile     77 y.o. female 76 year old female with past medical history of paroxysmal atrial fibrillation, hypertension, hyperlipidemia, chronic stage III  kidney disease, diabetes mellitus, history of breast cancer with recurrent atrial fibrillation and atypical chest pain. Echocardiogram shows normal LV function, mild aortic stenosis, mildly dilated aortic root, mild to moderate mitral and tricuspid regurgitation. Electrocardiogram shows atrial fibrillation with increased anterior T wave inversion. Nuclear study shows EF 42, small partially reversible apical defect.    Assessment & Plan    1 persistent atrial fibrillation-patient remains in atrial fibrillation this morning. Increase coreg to 25 mg BID for improved rate control. Continue heparin; resume apixaban following cath when it is clear all procedures complete. TEE/DCCV planned for Monday pending results of cath.  2 Atypical CP, EKG changes (anterior TWI), abnormal nuclear study-Nuclear study with mild apical ischemia. Plan cath today; risks and benefits including MI, CVA and death discussed previously and pt agrees to proceed.  3 hypertension-Blood pressure is mildly elevated; increase coreg to 25 mg BID and follow.  4acute on chronic stage III kidney disease-follow renal function following procedure.  5Acute on chronic diastolic congestive heart failure-euvolemic; diuretics on hold for cath.  6 elevated d-dimer-Will arrange VQ scan tomorrow; regardless pt will be anticoagulated for atrial fibrillation.    For questions or updates, please contact [place] Please consult [email or web address] for contact info under Cardiology/STEMI.      Signed, Kirk Ruths, MD  05/12/2017, 8:05 AM

## 2017-05-12 NOTE — Interval H&P Note (Signed)
History and Physical Interval Note:  05/12/2017 2:03 PM  Carrie Key  has presented today for surgery, with the diagnosis of abnormal stress test with abnormal EKG.   The various methods of treatment have been discussed with the patient and family. After consideration of risks, benefits and other options for treatment, the patient has consented to  Procedure(s): LEFT HEART CATH AND CORONARY ANGIOGRAPHY (N/A) with possible PERCUTANEOUS CORONARY INTERVENTION as a surgical intervention .  The patient's history has been reviewed, patient examined, no change in status, stable for surgery.  I have reviewed the patient's chart and labs.  Questions were answered to the patient's satisfaction.    Cath Lab Visit (complete for each Cath Lab visit)  Clinical Evaluation Leading to the Procedure:   ACS: No.  Non-ACS:    Anginal Classification: CCS II  Anti-ischemic medical therapy: Minimal Therapy (1 class of medications)  Non-Invasive Test Results: Intermediate-risk stress test findings: cardiac mortality 1-3%/year  Prior CABG: No previous CABG   Carrie Key

## 2017-05-12 NOTE — Progress Notes (Signed)
Spoke w/Kim Councilman, [facility]/Director of Cath Lab - informed her of difficulty placing PICC per Carolee Rota, RN w/PICC team (unable to advance through SVC without chest pain).  Maudie Mercury states she will inform Dr. Ellyn Hack, MD of IV access issues as soon as he is complete w/current procedure/family consult.

## 2017-05-12 NOTE — Progress Notes (Signed)
Still awaiting PICC placement. Cath Lab made aware. Will contact them once PICC is in so they can proceed with procedure. Pt is aware of this as well. Will continue to monitor.

## 2017-05-12 NOTE — H&P (View-Only) (Signed)
Progress Note  Patient Name: Carrie Key Date of Encounter: 05/12/2017  Primary Cardiologist: Dr Stanford Breed  Subjective   Mild dyspnea and chest "sore"  Inpatient Medications    Scheduled Meds: . amLODipine  5 mg Oral Daily  . anastrozole  1 mg Oral Daily  . [START ON 05/13/2017] aspirin  81 mg Oral Pre-Cath  . aspirin EC  81 mg Oral Daily  . carvedilol  12.5 mg Oral BID WC  . gabapentin  300 mg Oral QHS  . hydrALAZINE  25 mg Oral Q8H  . insulin aspart  0-5 Units Subcutaneous QHS  . insulin aspart  0-9 Units Subcutaneous TID WC  . pantoprazole  40 mg Oral Daily  . potassium chloride SA  20 mEq Oral Daily  . rosuvastatin  10 mg Oral q1800  . sodium chloride flush  3 mL Intravenous Q12H  . sodium chloride flush  3 mL Intravenous Q12H  . sodium chloride flush  3 mL Intravenous Q12H   Continuous Infusions: . sodium chloride 20 mL/hr at 05/12/17 0300  . sodium chloride    . sodium chloride    . sodium chloride 20 mL/hr at 05/12/17 0651  . sodium chloride    . sodium chloride Stopped (05/12/17 0650)  . [START ON 05/13/2017] sodium chloride     Followed by  . [START ON 05/13/2017] sodium chloride    . heparin 1,500 Units/hr (05/12/17 0703)   PRN Meds: sodium chloride, sodium chloride, acetaminophen, ALPRAZolam, hydrALAZINE, hydrocortisone cream, meclizine, menthol-cetylpyridinium, ondansetron (ZOFRAN) IV, sodium chloride flush, sodium chloride flush, sodium chloride flush, traMADol, zolpidem   Vital Signs    Vitals:   05/12/17 0500 05/12/17 0652 05/12/17 0717 05/12/17 0720  BP:  (!) 161/103 (!) 199/114 (!) 191/96  Pulse:  91    Resp:   13 18  Temp:  98.4 F (36.9 C)    TempSrc:  Oral    SpO2:  96%    Weight: 228 lb 9.9 oz (103.7 kg)     Height:        Intake/Output Summary (Last 24 hours) at 05/12/2017 0805 Last data filed at 05/12/2017 0658 Gross per 24 hour  Intake 917.69 ml  Output 575 ml  Net 342.69 ml   Filed Weights   05/11/17 0553 05/11/17 1607 05/12/17  0500  Weight: 224 lb 3.2 oz (101.7 kg) 226 lb 12.8 oz (102.9 kg) 228 lb 9.9 oz (103.7 kg)    Telemetry    Atrial fibrillation with intermittent elevated rate - Personally Reviewed   Physical Exam   GEN: WD obese No acute distress.   Neck: No JVD, supple Cardiac: irregular and mildly tachycardic, no murmur Respiratory: Clear to auscultation bilaterally; no wheeze. GI: Soft, nontender, non-distended, no masses  MS: No edema Neuro:  Grossly intact   Labs    Chemistry Recent Labs  Lab 05/10/17 2022 05/11/17 0410 05/12/17 0323  NA 138 137 136  K 3.7 3.5 3.6  CL 102 102 102  CO2 23 24 19*  GLUCOSE 143* 125* 120*  BUN 17 20 19   CREATININE 1.45* 1.32* 1.21*  CALCIUM 10.0 9.7 9.6  GFRNONAA 34* 38* 43*  GFRAA 40* 45* 49*  ANIONGAP 13 11 15      Hematology Recent Labs  Lab 05/10/17 0310 05/11/17 0410 05/12/17 0323  WBC 6.4 6.3 6.9  RBC 4.34 4.35 4.25  HGB 13.1 13.3 12.9  HCT 40.5 41.5 40.5  MCV 93.3 95.4 95.3  MCH 30.2 30.6 30.4  MCHC 32.3 32.0 31.9  RDW 13.7 14.3 14.2  PLT 229 232 215     Recent Labs  Lab 05/07/17 2211  TROPIPOC 0.04     BNP Recent Labs  Lab 05/07/17 2206  BNP 405.0*     DDimer  Recent Labs  Lab 05/09/17 1032  DDIMER 3.40*     Radiology    Nm Myocar Multi W/spect W/wall Motion / Ef  Result Date: 05/10/2017  There was no ST segment deviation noted during stress.  No T wave inversion was noted during stress.  The left ventricular ejection fraction is moderately decreased (30-44%).  Nuclear stress EF: 42%. There is apical septal wall akinesis.  Defect 1: There is a medium defect of mild severity present in the apex location.  Findings consistent with prior myocardial infarction with peri-infarct ischemia.  This is an intermediate risk study.  Candee Furbish, MD    Patient Profile     77 y.o. female 77 year old female with past medical history of paroxysmal atrial fibrillation, hypertension, hyperlipidemia, chronic stage III  kidney disease, diabetes mellitus, history of breast cancer with recurrent atrial fibrillation and atypical chest pain. Echocardiogram shows normal LV function, mild aortic stenosis, mildly dilated aortic root, mild to moderate mitral and tricuspid regurgitation. Electrocardiogram shows atrial fibrillation with increased anterior T wave inversion. Nuclear study shows EF 42, small partially reversible apical defect.    Assessment & Plan    1 persistent atrial fibrillation-patient remains in atrial fibrillation this morning. Increase coreg to 25 mg BID for improved rate control. Continue heparin; resume apixaban following cath when it is clear all procedures complete. TEE/DCCV planned for Monday pending results of cath.  2 Atypical CP, EKG changes (anterior TWI), abnormal nuclear study-Nuclear study with mild apical ischemia. Plan cath today; risks and benefits including MI, CVA and death discussed previously and pt agrees to proceed.  3 hypertension-Blood pressure is mildly elevated; increase coreg to 25 mg BID and follow.  4acute on chronic stage III kidney disease-follow renal function following procedure.  5Acute on chronic diastolic congestive heart failure-euvolemic; diuretics on hold for cath.  6 elevated d-dimer-Will arrange VQ scan tomorrow; regardless pt will be anticoagulated for atrial fibrillation.    For questions or updates, please contact [place] Please consult [email or web address] for contact info under Cardiology/STEMI.      Signed, Kirk Ruths, MD  05/12/2017, 8:05 AM

## 2017-05-12 NOTE — Progress Notes (Signed)
Attempted to place bedside PICC.  Vein cannulation difficult but once cannulated and PICC attempted to be threaded into SVC PICC would not thread into the SVC.  Patient began stating that she was experiencing chest tightness and pain.   PICC pulled back to subclavian vein level, blood return brisk and flushed with ease.  Patient stated that the discomfort stopped.  Attempted to thread again resulting in patient experiencing the same chest tightness and discomfort, PICC would not drop into SVC.  Further attempts stopped and PICC removed intact.  Patient states that the discomfort has stopped and is feeling "ok".  ECG still showing afib.  Tammy, RN updated and is notifying MD, family back at bedside.  Carolee Rota, RN VAST

## 2017-05-12 NOTE — Progress Notes (Signed)
ANTICOAGULATION CONSULT NOTE - Follow Up Consult  Pharmacy Consult for heparin Indication: atrial fibrillation  Labs: Recent Labs    05/10/17 0310 05/10/17 2022 05/11/17 0410 05/11/17 1100 05/11/17 1738 05/12/17 0323  HGB 13.1  --  13.3  --   --  12.9  HCT 40.5  --  41.5  --   --  40.5  PLT 229  --  232  --   --  215  APTT  --   --   --   --  26 44*  LABPROT  --   --   --  16.0*  --   --   INR  --   --   --  1.29  --   --   HEPARINUNFRC  --   --   --   --  1.00* 0.58  CREATININE 1.21* 1.45* 1.32*  --   --  1.21*    Assessment: 77yo female remains subtherapeutic on heparin after rate change, plan for cath today.  Goal of Therapy:  aPTT 66-102 seconds   Plan:  Will increase heparin gtt by 3 units/kg/hr to 1500 units/hr and check PTT in 8h if cath postponed.    Wynona Neat, PharmD, BCPS  05/12/2017,7:01 AM

## 2017-05-13 ENCOUNTER — Inpatient Hospital Stay (HOSPITAL_COMMUNITY): Payer: Medicare HMO

## 2017-05-13 LAB — BASIC METABOLIC PANEL
ANION GAP: 11 (ref 5–15)
BUN: 18 mg/dL (ref 6–20)
CALCIUM: 9.5 mg/dL (ref 8.9–10.3)
CO2: 20 mmol/L — AB (ref 22–32)
Chloride: 107 mmol/L (ref 101–111)
Creatinine, Ser: 1.14 mg/dL — ABNORMAL HIGH (ref 0.44–1.00)
GFR calc non Af Amer: 46 mL/min — ABNORMAL LOW (ref >60)
GFR, EST AFRICAN AMERICAN: 53 mL/min — AB (ref >60)
Glucose, Bld: 101 mg/dL — ABNORMAL HIGH (ref 65–99)
Potassium: 4.6 mmol/L (ref 3.5–5.1)
SODIUM: 138 mmol/L (ref 135–145)

## 2017-05-13 LAB — CBC
HEMATOCRIT: 37.4 % (ref 36.0–46.0)
HEMOGLOBIN: 12.1 g/dL (ref 12.0–15.0)
MCH: 31.1 pg (ref 26.0–34.0)
MCHC: 32.4 g/dL (ref 30.0–36.0)
MCV: 96.1 fL (ref 78.0–100.0)
Platelets: 237 10*3/uL (ref 150–400)
RBC: 3.89 MIL/uL (ref 3.87–5.11)
RDW: 14.7 % (ref 11.5–15.5)
WBC: 6.8 10*3/uL (ref 4.0–10.5)

## 2017-05-13 LAB — GLUCOSE, CAPILLARY
GLUCOSE-CAPILLARY: 106 mg/dL — AB (ref 65–99)
Glucose-Capillary: 134 mg/dL — ABNORMAL HIGH (ref 65–99)
Glucose-Capillary: 156 mg/dL — ABNORMAL HIGH (ref 65–99)

## 2017-05-13 LAB — APTT: aPTT: 22 seconds — ABNORMAL LOW (ref 24–36)

## 2017-05-13 MED ORDER — CALCIUM CARBONATE ANTACID 500 MG PO CHEW
1.0000 | CHEWABLE_TABLET | Freq: Three times a day (TID) | ORAL | Status: DC | PRN
  Administered 2017-05-13 – 2017-05-15 (×2): 200 mg via ORAL
  Filled 2017-05-13 (×2): qty 1

## 2017-05-13 MED ORDER — TECHNETIUM TO 99M ALBUMIN AGGREGATED
4.1800 | Freq: Once | INTRAVENOUS | Status: AC | PRN
  Administered 2017-05-13: 4.18 via INTRAVENOUS

## 2017-05-13 MED ORDER — TECHNETIUM TC 99M DIETHYLENETRIAME-PENTAACETIC ACID
31.7000 | Freq: Once | INTRAVENOUS | Status: AC | PRN
  Administered 2017-05-13: 31.7 via RESPIRATORY_TRACT

## 2017-05-13 MED ORDER — AMLODIPINE BESYLATE 10 MG PO TABS
10.0000 mg | ORAL_TABLET | Freq: Every day | ORAL | Status: DC
  Administered 2017-05-14: 10 mg via ORAL
  Filled 2017-05-13: qty 1

## 2017-05-13 NOTE — Progress Notes (Signed)
Paged MD to make aware of atrial flutter HR 101.

## 2017-05-13 NOTE — Progress Notes (Signed)
Progress Note  Patient Name: Carrie Key Date of Encounter: 05/13/2017  Primary Cardiologist: Dr Stanford Breed  Subjective   No chest pain or dyspnea  Inpatient Medications    Scheduled Meds: . amLODipine  5 mg Oral Daily  . anastrozole  1 mg Oral Daily  . apixaban  5 mg Oral BID  . aspirin EC  81 mg Oral Daily  . carvedilol  25 mg Oral BID WC  . gabapentin  300 mg Oral QHS  . hydrALAZINE  25 mg Oral Q8H  . insulin aspart  0-5 Units Subcutaneous QHS  . insulin aspart  0-9 Units Subcutaneous TID WC  . pantoprazole  40 mg Oral Daily  . potassium chloride SA  20 mEq Oral Daily  . rosuvastatin  10 mg Oral q1800  . sodium chloride flush  3 mL Intravenous Q12H   Continuous Infusions: . sodium chloride     PRN Meds: sodium chloride, acetaminophen, ALPRAZolam, hydrALAZINE, meclizine, menthol-cetylpyridinium, ondansetron (ZOFRAN) IV, sodium chloride flush, traMADol, zolpidem   Vital Signs    Vitals:   05/13/17 0322 05/13/17 0510 05/13/17 0653 05/13/17 0835  BP: (!) 154/91 (!) 163/86  (!) 149/104  Pulse: 90   (!) 113  Resp: 16   14  Temp: (!) 97.3 F (36.3 C)   98.7 F (37.1 C)  TempSrc: Oral   Oral  SpO2: 95%   96%  Weight:   229 lb 4.8 oz (104 kg)   Height:        Intake/Output Summary (Last 24 hours) at 05/13/2017 0923 Last data filed at 05/13/2017 0700 Gross per 24 hour  Intake 0 ml  Output 0 ml  Net 0 ml   Filed Weights   05/11/17 1607 05/12/17 0500 05/13/17 0653  Weight: 226 lb 12.8 oz (102.9 kg) 228 lb 9.9 oz (103.7 kg) 229 lb 4.8 oz (104 kg)    Telemetry    Atrial fibrillation with mildly elevated rate - Personally Reviewed   Physical Exam   GEN: WD obese NAD  Neck: supple Cardiac: irregular Respiratory: CTA GI: Soft, NT/ND MS: No edema; radial cath site and femoral cath site with no hematoma; no femoral bruit Neuro:  No focal findings   Labs    Chemistry Recent Labs  Lab 05/11/17 0410 05/12/17 0323 05/13/17 0648  NA 137 136 138  K 3.5  3.6 4.6  CL 102 102 107  CO2 24 19* 20*  GLUCOSE 125* 120* 101*  BUN 20 19 18   CREATININE 1.32* 1.21* 1.14*  CALCIUM 9.7 9.6 9.5  GFRNONAA 38* 43* 46*  GFRAA 45* 49* 53*  ANIONGAP 11 15 11      Hematology Recent Labs  Lab 05/11/17 0410 05/12/17 0323 05/13/17 0648  WBC 6.3 6.9 6.8  RBC 4.35 4.25 3.89  HGB 13.3 12.9 12.1  HCT 41.5 40.5 37.4  MCV 95.4 95.3 96.1  MCH 30.6 30.4 31.1  MCHC 32.0 31.9 32.4  RDW 14.3 14.2 14.7  PLT 232 215 237     Recent Labs  Lab 05/07/17 2211  TROPIPOC 0.04     BNP Recent Labs  Lab 05/07/17 2206  BNP 405.0*     DDimer  Recent Labs  Lab 05/09/17 1032  DDIMER 3.40*     Radiology    [place] Ekg Site Rite  Result Date: 05/12/2017 If Site Rite image not attached, placement could not be confirmed due to current cardiac rhythm.   Patient Profile     77 y.o. female 77 year old female with past  medical history of paroxysmal atrial fibrillation, hypertension, hyperlipidemia, chronic stage III kidney disease, diabetes mellitus, history of breast cancer with recurrent atrial fibrillation and atypical chest pain. Echocardiogram shows normal LV function, mild aortic stenosis, mildly dilated aortic root, mild to moderate mitral and tricuspid regurgitation. Electrocardiogram shows atrial fibrillation with increased anterior T wave inversion. Nuclear study shows EF 42, small partially reversible apical defect.    Assessment & Plan    1 persistent atrial fibrillation-patient remains in atrial fibrillation this morning. Continue present dose of coreg. Resume apixaban 5 mg BID. TEE/DCCV planned for Monday.  2 Atypical CP, EKG changes (anterior TWI), abnormal nuclear study-Cath reveals nonobstructive CAD. Continue statin; DC asa given need for apixaban.   3 hypertension-Blood pressure is mildly elevated; increase amlodipine to 10 mg daily and follow.  4acute on chronic stage III kidney disease-recheck BMET in AM.   5Acute on chronic  diastolic congestive heart failure-euvolemic; may need low dose diuretic at DC.  6 elevated d-dimer-Will arrange VQ scan.    For questions or updates, please contact [place] Please consult [email or web address] for contact info under Cardiology/STEMI.      Signed, Kirk Ruths, MD  05/13/2017, 9:23 AM

## 2017-05-14 LAB — GLUCOSE, CAPILLARY
GLUCOSE-CAPILLARY: 100 mg/dL — AB (ref 65–99)
GLUCOSE-CAPILLARY: 134 mg/dL — AB (ref 65–99)
GLUCOSE-CAPILLARY: 130 mg/dL — AB (ref 65–99)
GLUCOSE-CAPILLARY: 143 mg/dL — AB (ref 65–99)

## 2017-05-14 LAB — CBC
HCT: 36.4 % (ref 36.0–46.0)
Hemoglobin: 11.3 g/dL — ABNORMAL LOW (ref 12.0–15.0)
MCH: 29.7 pg (ref 26.0–34.0)
MCHC: 31 g/dL (ref 30.0–36.0)
MCV: 95.5 fL (ref 78.0–100.0)
Platelets: 216 10*3/uL (ref 150–400)
RBC: 3.81 MIL/uL — ABNORMAL LOW (ref 3.87–5.11)
RDW: 14 % (ref 11.5–15.5)
WBC: 5.6 10*3/uL (ref 4.0–10.5)

## 2017-05-14 LAB — BASIC METABOLIC PANEL
ANION GAP: 9 (ref 5–15)
BUN: 20 mg/dL (ref 6–20)
CALCIUM: 9.7 mg/dL (ref 8.9–10.3)
CO2: 23 mmol/L (ref 22–32)
Chloride: 105 mmol/L (ref 101–111)
Creatinine, Ser: 1.48 mg/dL — ABNORMAL HIGH (ref 0.44–1.00)
GFR calc Af Amer: 39 mL/min — ABNORMAL LOW (ref >60)
GFR calc non Af Amer: 33 mL/min — ABNORMAL LOW (ref >60)
GLUCOSE: 152 mg/dL — AB (ref 65–99)
Potassium: 4.1 mmol/L (ref 3.5–5.1)
Sodium: 137 mmol/L (ref 135–145)

## 2017-05-14 LAB — APTT: aPTT: 36 seconds (ref 24–36)

## 2017-05-14 MED ORDER — HYDRALAZINE HCL 50 MG PO TABS
50.0000 mg | ORAL_TABLET | Freq: Three times a day (TID) | ORAL | Status: DC
  Administered 2017-05-14 – 2017-05-16 (×6): 50 mg via ORAL
  Filled 2017-05-14 (×6): qty 1

## 2017-05-14 NOTE — Progress Notes (Signed)
Progress Note  Patient Name: Carrie Key Date of Encounter: 05/14/2017  Primary Cardiologist: Dr Stanford Breed  Subjective   No chest pain; describes DOE  Inpatient Medications    Scheduled Meds: . amLODipine  10 mg Oral Daily  . anastrozole  1 mg Oral Daily  . apixaban  5 mg Oral BID  . carvedilol  25 mg Oral BID WC  . gabapentin  300 mg Oral QHS  . hydrALAZINE  25 mg Oral Q8H  . insulin aspart  0-5 Units Subcutaneous QHS  . insulin aspart  0-9 Units Subcutaneous TID WC  . pantoprazole  40 mg Oral Daily  . potassium chloride SA  20 mEq Oral Daily  . rosuvastatin  10 mg Oral q1800  . sodium chloride flush  3 mL Intravenous Q12H   Continuous Infusions: . sodium chloride     PRN Meds: sodium chloride, acetaminophen, ALPRAZolam, calcium carbonate, hydrALAZINE, meclizine, menthol-cetylpyridinium, ondansetron (ZOFRAN) IV, sodium chloride flush, traMADol, zolpidem   Vital Signs    Vitals:   05/14/17 0412 05/14/17 0643 05/14/17 0812 05/14/17 1100  BP: (!) 145/86  129/83 (!) 125/91  Pulse:   81 83  Resp:   16 14  Temp:   98.2 F (36.8 C) 98.5 F (36.9 C)  TempSrc:   Oral Oral  SpO2:   93% 100%  Weight:  230 lb 6.4 oz (104.5 kg)    Height:        Intake/Output Summary (Last 24 hours) at 05/14/2017 1119 Last data filed at 05/14/2017 0900 Gross per 24 hour  Intake 1110 ml  Output 75 ml  Net 1035 ml   Filed Weights   05/12/17 0500 05/13/17 0653 05/14/17 0643  Weight: 228 lb 9.9 oz (103.7 kg) 229 lb 4.8 oz (104 kg) 230 lb 6.4 oz (104.5 kg)    Telemetry    Atrial fibrillation with mildly elevated rate - Personally Reviewed   Physical Exam   GEN: WD obese mildly dyspneic Neck: supple, no JVD Cardiac: irregular, mildly tachycardic Respiratory: CTA; no wheeze GI: Soft, NT/ND, no masses MS: No edema Neuro:  Grossly intact   Labs    Chemistry Recent Labs  Lab 05/12/17 0323 05/13/17 0648 05/14/17 0210  NA 136 138 137  K 3.6 4.6 4.1  CL 102 107 105  CO2  19* 20* 23  GLUCOSE 120* 101* 152*  BUN 19 18 20   CREATININE 1.21* 1.14* 1.48*  CALCIUM 9.6 9.5 9.7  GFRNONAA 43* 46* 33*  GFRAA 49* 53* 39*  ANIONGAP 15 11 9      Hematology Recent Labs  Lab 05/12/17 0323 05/13/17 0648 05/14/17 0210  WBC 6.9 6.8 5.6  RBC 4.25 3.89 3.81*  HGB 12.9 12.1 11.3*  HCT 40.5 37.4 36.4  MCV 95.3 96.1 95.5  MCH 30.4 31.1 29.7  MCHC 31.9 32.4 31.0  RDW 14.2 14.7 14.0  PLT 215 237 216     Recent Labs  Lab 05/07/17 2211  TROPIPOC 0.04     BNP Recent Labs  Lab 05/07/17 2206  BNP 405.0*     DDimer  Recent Labs  Lab 05/09/17 1032  DDIMER 3.40*     Radiology    Dg Chest 2 View  Result Date: 05/13/2017 CLINICAL DATA:  77 year old female with cough and shortness of breath EXAM: CHEST - 2 VIEW COMPARISON:  Prior chest x-ray 05/09/2017 FINDINGS: Stable cardiomegaly. Mediastinum is within normal limits. The lungs are clear. No focal airspace consolidation, evidence of pulmonary edema, pleural effusion or pneumothorax. Multilevel  degenerative endplate spurring throughout the thoracic spine. IMPRESSION: 1. Stable chest x-ray without evidence of acute cardiopulmonary disease. 2. Stable cardiomegaly. Electronically Signed   By: Jacqulynn Cadet M.D.   On: 05/13/2017 12:27   Nm Pulmonary Perf And Vent  Result Date: 05/13/2017 CLINICAL DATA:  Elevated D-dimer level, clinical concern for pulmonary embolus. Chest fluttering sensation for 3 days with fatigue and shortness of breath. Intermittent chest pain. Atrial fibrillation with tachycardia. EXAM: NUCLEAR MEDICINE VENTILATION - PERFUSION LUNG SCAN TECHNIQUE: Ventilation images were obtained in multiple projections using inhaled aerosol Tc-63m DTPA. Perfusion images were obtained in multiple projections after intravenous injection of Tc-5m-MAA. RADIOPHARMACEUTICALS:  31.7 mCi of Tc-71m DTPA aerosol inhalation and 4.2 mCi Tc58m-MAA IV COMPARISON:  Chest radiograph from 05/13/2017 FINDINGS: Ventilation: No  focal ventilation defect. Incidental note is made of swallowed radiopharmaceutical. Perfusion: No wedge shaped peripheral perfusion defects to suggest acute pulmonary embolism. IMPRESSION: Normal ventilation perfusion lung scan, without evidence of pulmonary embolus. Electronically Signed   By: Van Clines M.D.   On: 05/13/2017 15:03    Patient Profile     77 y.o. female 77 year old female with past medical history of paroxysmal atrial fibrillation, hypertension, hyperlipidemia, chronic stage III kidney disease, diabetes mellitus, history of breast cancer with recurrent atrial fibrillation and atypical chest pain. Echocardiogram shows normal LV function, mild aortic stenosis, mildly dilated aortic root, mild to moderate mitral and tricuspid regurgitation. Electrocardiogram shows atrial fibrillation with increased anterior T wave inversion. Nuclear study shows EF 42, small partially reversible apical defect.    Assessment & Plan    1 persistent atrial fibrillation-patient remains in atrial fibrillation this morning; rate mildly increased. Continue present dose of coreg; continue apixaban 5 mg BID. TEE/DCCV planned for tomorrow. Hopefully DOE will improve with re-establishing sinus.  2 Atypical CP, EKG changes (anterior TWI), abnormal nuclear study-Cath revealed nonobstructive CAD. Continue statin; no ASA given need for apixaban.   3 hypertension-Blood pressure is mildly elevated; increase hydralazine to 50 mg TID and follow.  4acute on chronic stage III kidney disease-renal function slightly worse today; repeat bmet in AM.  5Acute on chronic diastolic congestive heart failure-remains euvolemic; may need low dose diuretic at DC.  6 elevated d-dimer-VQ scan negative.    For questions or updates, please contact [place] Please consult [email or web address] for contact info under Cardiology/STEMI.      Signed, Kirk Ruths, MD  05/14/2017, 11:19 AM

## 2017-05-14 NOTE — H&P (View-Only) (Signed)
Progress Note  Patient Name: Carrie Key Date of Encounter: 05/14/2017  Primary Cardiologist: Dr Stanford Breed  Subjective   No chest pain; describes DOE  Inpatient Medications    Scheduled Meds: . amLODipine  10 mg Oral Daily  . anastrozole  1 mg Oral Daily  . apixaban  5 mg Oral BID  . carvedilol  25 mg Oral BID WC  . gabapentin  300 mg Oral QHS  . hydrALAZINE  25 mg Oral Q8H  . insulin aspart  0-5 Units Subcutaneous QHS  . insulin aspart  0-9 Units Subcutaneous TID WC  . pantoprazole  40 mg Oral Daily  . potassium chloride SA  20 mEq Oral Daily  . rosuvastatin  10 mg Oral q1800  . sodium chloride flush  3 mL Intravenous Q12H   Continuous Infusions: . sodium chloride     PRN Meds: sodium chloride, acetaminophen, ALPRAZolam, calcium carbonate, hydrALAZINE, meclizine, menthol-cetylpyridinium, ondansetron (ZOFRAN) IV, sodium chloride flush, traMADol, zolpidem   Vital Signs    Vitals:   05/14/17 0412 05/14/17 0643 05/14/17 0812 05/14/17 1100  BP: (!) 145/86  129/83 (!) 125/91  Pulse:   81 83  Resp:   16 14  Temp:   98.2 F (36.8 C) 98.5 F (36.9 C)  TempSrc:   Oral Oral  SpO2:   93% 100%  Weight:  230 lb 6.4 oz (104.5 kg)    Height:        Intake/Output Summary (Last 24 hours) at 05/14/2017 1119 Last data filed at 05/14/2017 0900 Gross per 24 hour  Intake 1110 ml  Output 75 ml  Net 1035 ml   Filed Weights   05/12/17 0500 05/13/17 0653 05/14/17 0643  Weight: 228 lb 9.9 oz (103.7 kg) 229 lb 4.8 oz (104 kg) 230 lb 6.4 oz (104.5 kg)    Telemetry    Atrial fibrillation with mildly elevated rate - Personally Reviewed   Physical Exam   GEN: WD obese mildly dyspneic Neck: supple, no JVD Cardiac: irregular, mildly tachycardic Respiratory: CTA; no wheeze GI: Soft, NT/ND, no masses MS: No edema Neuro:  Grossly intact   Labs    Chemistry Recent Labs  Lab 05/12/17 0323 05/13/17 0648 05/14/17 0210  NA 136 138 137  K 3.6 4.6 4.1  CL 102 107 105  CO2  19* 20* 23  GLUCOSE 120* 101* 152*  BUN 19 18 20   CREATININE 1.21* 1.14* 1.48*  CALCIUM 9.6 9.5 9.7  GFRNONAA 43* 46* 33*  GFRAA 49* 53* 39*  ANIONGAP 15 11 9      Hematology Recent Labs  Lab 05/12/17 0323 05/13/17 0648 05/14/17 0210  WBC 6.9 6.8 5.6  RBC 4.25 3.89 3.81*  HGB 12.9 12.1 11.3*  HCT 40.5 37.4 36.4  MCV 95.3 96.1 95.5  MCH 30.4 31.1 29.7  MCHC 31.9 32.4 31.0  RDW 14.2 14.7 14.0  PLT 215 237 216     Recent Labs  Lab 05/07/17 2211  TROPIPOC 0.04     BNP Recent Labs  Lab 05/07/17 2206  BNP 405.0*     DDimer  Recent Labs  Lab 05/09/17 1032  DDIMER 3.40*     Radiology    Dg Chest 2 View  Result Date: 05/13/2017 CLINICAL DATA:  77 year old female with cough and shortness of breath EXAM: CHEST - 2 VIEW COMPARISON:  Prior chest x-ray 05/09/2017 FINDINGS: Stable cardiomegaly. Mediastinum is within normal limits. The lungs are clear. No focal airspace consolidation, evidence of pulmonary edema, pleural effusion or pneumothorax. Multilevel  degenerative endplate spurring throughout the thoracic spine. IMPRESSION: 1. Stable chest x-ray without evidence of acute cardiopulmonary disease. 2. Stable cardiomegaly. Electronically Signed   By: Jacqulynn Cadet M.D.   On: 05/13/2017 12:27   Nm Pulmonary Perf And Vent  Result Date: 05/13/2017 CLINICAL DATA:  Elevated D-dimer level, clinical concern for pulmonary embolus. Chest fluttering sensation for 3 days with fatigue and shortness of breath. Intermittent chest pain. Atrial fibrillation with tachycardia. EXAM: NUCLEAR MEDICINE VENTILATION - PERFUSION LUNG SCAN TECHNIQUE: Ventilation images were obtained in multiple projections using inhaled aerosol Tc-75m DTPA. Perfusion images were obtained in multiple projections after intravenous injection of Tc-69m-MAA. RADIOPHARMACEUTICALS:  31.7 mCi of Tc-95m DTPA aerosol inhalation and 4.2 mCi Tc61m-MAA IV COMPARISON:  Chest radiograph from 05/13/2017 FINDINGS: Ventilation: No  focal ventilation defect. Incidental note is made of swallowed radiopharmaceutical. Perfusion: No wedge shaped peripheral perfusion defects to suggest acute pulmonary embolism. IMPRESSION: Normal ventilation perfusion lung scan, without evidence of pulmonary embolus. Electronically Signed   By: Van Clines M.D.   On: 05/13/2017 15:03    Patient Profile     77 y.o. female 77 year old female with past medical history of paroxysmal atrial fibrillation, hypertension, hyperlipidemia, chronic stage III kidney disease, diabetes mellitus, history of breast cancer with recurrent atrial fibrillation and atypical chest pain. Echocardiogram shows normal LV function, mild aortic stenosis, mildly dilated aortic root, mild to moderate mitral and tricuspid regurgitation. Electrocardiogram shows atrial fibrillation with increased anterior T wave inversion. Nuclear study shows EF 42, small partially reversible apical defect.    Assessment & Plan    1 persistent atrial fibrillation-patient remains in atrial fibrillation this morning; rate mildly increased. Continue present dose of coreg; continue apixaban 5 mg BID. TEE/DCCV planned for tomorrow. Hopefully DOE will improve with re-establishing sinus.  2 Atypical CP, EKG changes (anterior TWI), abnormal nuclear study-Cath revealed nonobstructive CAD. Continue statin; no ASA given need for apixaban.   3 hypertension-Blood pressure is mildly elevated; increase hydralazine to 50 mg TID and follow.  4acute on chronic stage III kidney disease-renal function slightly worse today; repeat bmet in AM.  5Acute on chronic diastolic congestive heart failure-remains euvolemic; may need low dose diuretic at DC.  6 elevated d-dimer-VQ scan negative.    For questions or updates, please contact [place] Please consult [email or web address] for contact info under Cardiology/STEMI.      Signed, Kirk Ruths, MD  05/14/2017, 11:19 AM

## 2017-05-15 ENCOUNTER — Encounter (HOSPITAL_COMMUNITY)
Admission: EM | Disposition: A | Discharge status: Home / Self Care | Payer: Self-pay | Source: Home / Self Care | Attending: Cardiology

## 2017-05-15 ENCOUNTER — Inpatient Hospital Stay (HOSPITAL_COMMUNITY): Payer: Medicare HMO | Admitting: Certified Registered Nurse Anesthetist

## 2017-05-15 ENCOUNTER — Encounter (HOSPITAL_COMMUNITY): Payer: Self-pay | Admitting: Cardiology

## 2017-05-15 ENCOUNTER — Inpatient Hospital Stay (HOSPITAL_COMMUNITY)
Admit: 2017-05-15 | Discharge: 2017-05-15 | Disposition: A | Discharge status: Home / Self Care | Payer: Medicare HMO | Attending: Cardiology | Admitting: Cardiology

## 2017-05-15 ENCOUNTER — Other Ambulatory Visit: Payer: Self-pay

## 2017-05-15 DIAGNOSIS — R079 Chest pain, unspecified: Secondary | ICD-10-CM

## 2017-05-15 DIAGNOSIS — I4891 Unspecified atrial fibrillation: Secondary | ICD-10-CM

## 2017-05-15 DIAGNOSIS — I340 Nonrheumatic mitral (valve) insufficiency: Secondary | ICD-10-CM

## 2017-05-15 HISTORY — PX: TEE WITHOUT CARDIOVERSION: SHX5443

## 2017-05-15 HISTORY — PX: CARDIOVERSION: SHX1299

## 2017-05-15 LAB — CBC
HEMATOCRIT: 34.6 % — AB (ref 36.0–46.0)
Hemoglobin: 10.9 g/dL — ABNORMAL LOW (ref 12.0–15.0)
MCH: 30 pg (ref 26.0–34.0)
MCHC: 31.5 g/dL (ref 30.0–36.0)
MCV: 95.3 fL (ref 78.0–100.0)
Platelets: 189 10*3/uL (ref 150–400)
RBC: 3.63 MIL/uL — ABNORMAL LOW (ref 3.87–5.11)
RDW: 14.1 % (ref 11.5–15.5)
WBC: 5.6 10*3/uL (ref 4.0–10.5)

## 2017-05-15 LAB — BASIC METABOLIC PANEL
ANION GAP: 10 (ref 5–15)
BUN: 16 mg/dL (ref 6–20)
CO2: 22 mmol/L (ref 22–32)
Calcium: 9.6 mg/dL (ref 8.9–10.3)
Chloride: 107 mmol/L (ref 101–111)
Creatinine, Ser: 1.29 mg/dL — ABNORMAL HIGH (ref 0.44–1.00)
GFR calc Af Amer: 46 mL/min — ABNORMAL LOW (ref >60)
GFR calc non Af Amer: 39 mL/min — ABNORMAL LOW (ref >60)
GLUCOSE: 115 mg/dL — AB (ref 65–99)
Potassium: 4 mmol/L (ref 3.5–5.1)
Sodium: 139 mmol/L (ref 135–145)

## 2017-05-15 LAB — PROTIME-INR
INR: 1.34
Prothrombin Time: 16.5 seconds — ABNORMAL HIGH (ref 11.4–15.2)

## 2017-05-15 LAB — GLUCOSE, CAPILLARY
GLUCOSE-CAPILLARY: 117 mg/dL — AB (ref 65–99)
GLUCOSE-CAPILLARY: 146 mg/dL — AB (ref 65–99)
Glucose-Capillary: 161 mg/dL — ABNORMAL HIGH (ref 65–99)

## 2017-05-15 LAB — APTT: aPTT: 37 seconds — ABNORMAL HIGH (ref 24–36)

## 2017-05-15 SURGERY — CARDIOVERSION
Anesthesia: Monitor Anesthesia Care

## 2017-05-15 MED ORDER — LACTATED RINGERS IV SOLN
INTRAVENOUS | Status: DC | PRN
  Administered 2017-05-15: 11:00:00 via INTRAVENOUS

## 2017-05-15 MED ORDER — NITROGLYCERIN 0.4 MG SL SUBL
0.4000 mg | SUBLINGUAL_TABLET | SUBLINGUAL | Status: DC | PRN
  Administered 2017-05-15: 0.4 mg via SUBLINGUAL

## 2017-05-15 MED ORDER — NITROGLYCERIN 0.4 MG SL SUBL
SUBLINGUAL_TABLET | SUBLINGUAL | Status: AC
  Filled 2017-05-15: qty 1

## 2017-05-15 MED ORDER — PROPOFOL 10 MG/ML IV BOLUS
INTRAVENOUS | Status: DC | PRN
  Administered 2017-05-15 (×2): 10 mg via INTRAVENOUS

## 2017-05-15 MED ORDER — ISOSORBIDE DINITRATE 10 MG PO TABS
10.0000 mg | ORAL_TABLET | Freq: Two times a day (BID) | ORAL | Status: DC
  Administered 2017-05-15 – 2017-05-16 (×3): 10 mg via ORAL
  Filled 2017-05-15 (×3): qty 1

## 2017-05-15 MED ORDER — PROPOFOL 500 MG/50ML IV EMUL
INTRAVENOUS | Status: DC | PRN
  Administered 2017-05-15: 85 ug/kg/min via INTRAVENOUS

## 2017-05-15 MED FILL — Heparin Sodium (Porcine) Inj 1000 Unit/ML: INTRAMUSCULAR | Qty: 10 | Status: AC

## 2017-05-15 MED FILL — Lidocaine HCl Local Inj 1%: INTRAMUSCULAR | Qty: 20 | Status: AC

## 2017-05-15 MED FILL — Heparin Sodium (Porcine) 2 Unit/ML in Sodium Chloride 0.9%: INTRAMUSCULAR | Qty: 1000 | Status: AC

## 2017-05-15 NOTE — Progress Notes (Signed)
Pt educated again about importance of using the hat for strict I&O; pt removed hat when using bathroom bc she assumed she only needed to have a BM.   [place]  Kirk Basquez, RN

## 2017-05-15 NOTE — Anesthesia Procedure Notes (Signed)
Procedure Name: MAC Date/Time: 05/15/2017 12:09 PM Performed by: Harden Mo, CRNA Pre-anesthesia Checklist: Patient identified, Emergency Drugs available, Suction available and Patient being monitored Patient Re-evaluated:Patient Re-evaluated prior to induction Oxygen Delivery Method: Nasal cannula Preoxygenation: Pre-oxygenation with 100% oxygen Induction Type: IV induction Placement Confirmation: positive ETCO2 and breath sounds checked- equal and bilateral Dental Injury: Teeth and Oropharynx as per pre-operative assessment

## 2017-05-15 NOTE — Progress Notes (Signed)
Pt states she takes Nexium or Prilosec at home and would prefer one of these be added if possible. Educated of similarities to Protonix/Cepacol/Tums that are already available in [facility].   [place]  Vella Colquitt, RN

## 2017-05-15 NOTE — Interval H&P Note (Signed)
History and Physical Interval Note:  05/15/2017 9:43 AM  Carrie Key  has presented today for surgery, with the diagnosis of afib  The various methods of treatment have been discussed with the patient and family. After consideration of risks, benefits and other options for treatment, the patient has consented to  Procedure(s): CARDIOVERSION (N/A) TRANSESOPHAGEAL ECHOCARDIOGRAM (TEE) (N/A) as a surgical intervention .  The patient's history has been reviewed, patient examined, no change in status, stable for surgery.  I have reviewed the patient's chart and labs.  Questions were answered to the patient's satisfaction.     Skeet Latch, MD

## 2017-05-15 NOTE — Anesthesia Preprocedure Evaluation (Addendum)
Anesthesia Evaluation  Patient identified by MRN, date of birth, ID band Patient awake    Reviewed: Allergy & Precautions, NPO status , Patient's Chart, lab work & pertinent test results  Airway Mallampati: II  TM Distance: >3 FB Neck ROM: Full    Dental no notable dental hx. (+) Teeth Intact, Dental Advisory Given   Pulmonary former smoker,    Pulmonary exam normal breath sounds clear to auscultation       Cardiovascular hypertension, Normal cardiovascular exam+ Valvular Problems/Murmurs AS  Rhythm:Regular Rate:Normal     Neuro/Psych negative neurological ROS     GI/Hepatic negative GI ROS, Neg liver ROS,   Endo/Other  diabetes, Type 2, Oral Hypoglycemic Agents  Renal/GU CRFRenal disease     Musculoskeletal   Abdominal (+) + obese,   Peds  Hematology   Anesthesia Other Findings   Reproductive/Obstetrics                           Anesthesia Physical Anesthesia Plan  ASA: III  Anesthesia Plan: MAC   Post-op Pain Management:    Induction:   PONV Risk Score and Plan: Treatment may vary due to age or medical condition  Airway Management Planned: Mask  Additional Equipment:   Intra-op Plan:   Post-operative Plan:   Informed Consent: I have reviewed the patients History and Physical, chart, labs and discussed the procedure including the risks, benefits and alternatives for the proposed anesthesia with the patient or authorized representative who has indicated his/her understanding and acceptance.     Plan Discussed with: CRNA  Anesthesia Plan Comments:         Anesthesia Quick Evaluation

## 2017-05-15 NOTE — Progress Notes (Signed)
After Nitro x1 pt states no chest pain. Will continue to monitor.   [place]  Hertha Gergen, RN

## 2017-05-15 NOTE — Anesthesia Postprocedure Evaluation (Signed)
Anesthesia Post Note  Patient: Carrie Key  Procedure(s) Performed: CARDIOVERSION (N/A ) TRANSESOPHAGEAL ECHOCARDIOGRAM (TEE) (N/A )     Patient location during evaluation: Endoscopy Anesthesia Type: MAC Level of consciousness: awake and alert Pain management: pain level controlled Vital Signs Assessment: post-procedure vital signs reviewed and stable Respiratory status: spontaneous breathing, nonlabored ventilation, respiratory function stable and patient connected to nasal cannula oxygen Cardiovascular status: stable and blood pressure returned to baseline Postop Assessment: no apparent nausea or vomiting Anesthetic complications: no    Last Vitals:  Vitals:   05/15/17 1305 05/15/17 1328  BP: 131/87 (!) 151/95  Pulse: (!) 56   Resp: 12   Temp:    SpO2: 99%     Last Pain:  Vitals:   05/15/17 1305  TempSrc:   PainSc: 0-No pain                 Barnet Glasgow

## 2017-05-15 NOTE — CV Procedure (Signed)
TEE/DCCV note  LVEF 40-45% Mild MR and TR Trivial AR Mild calcification and thickening of the aortic valve No LA/LAA thrombus or masses LAA was smoky  For additional details see full report  Electrical Cardioversion Procedure Note Carrie Key [id number] 1940/03/05  Procedure: Electrical Cardioversion Indications:  Atrial Fibrillation  Procedure Details Consent: Risks of procedure as well as the alternatives and risks of each were explained to the (patient/caregiver).  Consent for procedure obtained. Time Out: Verified patient identification, verified procedure, site/side was marked, verified correct patient position, special equipment/implants available, medications/allergies/relevent history reviewed, required imaging and test results available.  Performed  Patient placed on cardiac monitor, pulse oximetry, supplemental oxygen as necessary.  Sedation given: propofol Pacer pads placed anterior and posterior chest.  Cardioverted 2 time(s).  Cardioverted at 150J unsuccessful.  200J successful.  Evaluation Findings: Post procedure EKG shows: sinus rhythm with PACs and PVCs. Complications: None Patient did tolerate procedure well.   Skeet Latch, MD 05/15/2017, 12:39 PM

## 2017-05-15 NOTE — Progress Notes (Signed)
Subjective:  Apparently [facility] earlier denies to me ECG being done reviewed afib with  No acute ECG changes  Discussed TEE/DCC in detail with patient   Objective:  Vitals:   05/14/17 2318 05/15/17 0410 05/15/17 0813 05/15/17 0841  BP: 117/78 (!) 125/92 (!) 148/98   Pulse: 85 81 (!) 108   Resp: 18 14  15   Temp: 98.4 F (36.9 C) 98.4 F (36.9 C)  98.5 F (36.9 C)  TempSrc: Oral Oral  Oral  SpO2: 98% 100%  99%  Weight:  236 lb 12.4 oz (107.4 kg)    Height:        Intake/Output from previous day:  Intake/Output Summary (Last 24 hours) at 05/15/2017 1013 Last data filed at 05/15/2017 0841 Gross per 24 hour  Intake 600 ml  Output 251 ml  Net 349 ml    Physical Exam: Affect appropriate Overweight non caucasian female  HEENT: normal Neck supple with no adenopathy JVP normal no bruits no thyromegaly Lungs clear with no wheezing and good diaphragmatic motion Heart:  S1/S2 no murmur, no rub, gallop or click PMI normal Abdomen: benighn, BS positve, no tenderness, no AAA no bruit.  No HSM or HJR Distal pulses intact with no bruits No edema Neuro non-focal Skin warm and dry No muscular weakness   Lab Results: Basic Metabolic Panel: Recent Labs    05/14/17 0210 05/15/17 0539  NA 137 139  K 4.1 4.0  CL 105 107  CO2 23 22  GLUCOSE 152* 115*  BUN 20 16  CREATININE 1.48* 1.29*  CALCIUM 9.7 9.6   Liver Function Tests: No results for input(s): AST, ALT, ALKPHOS, BILITOT, PROT, ALBUMIN in the last 72 hours. No results for input(s): LIPASE, AMYLASE in the last 72 hours. CBC: Recent Labs    05/14/17 0210 05/15/17 0539  WBC 5.6 5.6  HGB 11.3* 10.9*  HCT 36.4 34.6*  MCV 95.5 95.3  PLT 216 189     Imaging: Dg Chest 2 View  Result Date: 05/13/2017 CLINICAL DATA:  77 year old female with cough and shortness of breath EXAM: CHEST - 2 VIEW COMPARISON:  Prior chest x-ray 05/09/2017 FINDINGS: Stable cardiomegaly. Mediastinum is within normal limits. The lungs are  clear. No focal airspace consolidation, evidence of pulmonary edema, pleural effusion or pneumothorax. Multilevel degenerative endplate spurring throughout the thoracic spine. IMPRESSION: 1. Stable chest x-ray without evidence of acute cardiopulmonary disease. 2. Stable cardiomegaly. Electronically Signed   By: Jacqulynn Cadet M.D.   On: 05/13/2017 12:27   Nm Pulmonary Perf And Vent  Result Date: 05/13/2017 CLINICAL DATA:  Elevated D-dimer level, clinical concern for pulmonary embolus. Chest fluttering sensation for 3 days with fatigue and shortness of breath. Intermittent chest pain. Atrial fibrillation with tachycardia. EXAM: NUCLEAR MEDICINE VENTILATION - PERFUSION LUNG SCAN TECHNIQUE: Ventilation images were obtained in multiple projections using inhaled aerosol Tc-4m DTPA. Perfusion images were obtained in multiple projections after intravenous injection of Tc-44m-MAA. RADIOPHARMACEUTICALS:  31.7 mCi of Tc-42m DTPA aerosol inhalation and 4.2 mCi Tc97m-MAA IV COMPARISON:  Chest radiograph from 05/13/2017 FINDINGS: Ventilation: No focal ventilation defect. Incidental note is made of swallowed radiopharmaceutical. Perfusion: No wedge shaped peripheral perfusion defects to suggest acute pulmonary embolism. IMPRESSION: Normal ventilation perfusion lung scan, without evidence of pulmonary embolus. Electronically Signed   By: Van Clines M.D.   On: 05/13/2017 15:03    Cardiac Studies:  ECG:   Telemetry: afib rates 90-110  Echo: EF 55-60% no RWMAls mild to moderate MR LA 34 mm  Medications:   . amLODipine  10 mg Oral Daily  . anastrozole  1 mg Oral Daily  . apixaban  5 mg Oral BID  . carvedilol  25 mg Oral BID WC  . gabapentin  300 mg Oral QHS  . hydrALAZINE  50 mg Oral Q8H  . insulin aspart  0-5 Units Subcutaneous QHS  . insulin aspart  0-9 Units Subcutaneous TID WC  . pantoprazole  40 mg Oral Daily  . potassium chloride SA  20 mEq Oral Daily  . rosuvastatin  10 mg Oral q1800  .  sodium chloride flush  3 mL Intravenous Q12H     . sodium chloride      Assessment/Plan:  Chest Pain: resolved reviewed cath films and only 60% OM1 doubt angina continue coreg  D/c amlodipine start nitrates add 81 mg ASA given need for anticoagulation  PAF:  For TEE / [facility] with Dr Oval Linsey today risks and procedure discussed willing to proceed On eliquis since Friday evening continue coreg for rate control   HLDL with moderate single vessel CAD continue crestor target LDL less than 70  Lab Results  Component Value Date   LDLCALC 58 04/27/2016     Jenkins Rouge 05/15/2017, 10:13 AM

## 2017-05-15 NOTE — Progress Notes (Signed)
  Echocardiogram 2D Echocardiogram has been performed.  Han Lysne L Androw 05/15/2017, 1:01 PM

## 2017-05-15 NOTE — Transfer of Care (Signed)
Immediate Anesthesia Transfer of Care Note  Patient: Carrie Key  Procedure(s) Performed: CARDIOVERSION (N/A ) TRANSESOPHAGEAL ECHOCARDIOGRAM (TEE) (N/A )  Patient Location: Endoscopy Unit  Anesthesia Type:MAC  Level of Consciousness: awake, alert  and oriented  Airway & Oxygen Therapy: Patient Spontanous Breathing  Post-op Assessment: Report given to RN, Post -op Vital signs reviewed and stable and Patient moving all extremities X 4  Post vital signs: Reviewed and stable  Last Vitals:  Vitals Value Taken Time  BP 127/90 05/15/2017 12:43 PM  Temp    Pulse 66 05/15/2017 12:43 PM  Resp 17 05/15/2017 12:43 PM  SpO2 96 % 05/15/2017 12:43 PM  Vitals shown include unvalidated device data.  Last Pain:  Vitals:   05/15/17 1057  TempSrc: Oral  PainSc: 0-No pain      Patients Stated Pain Goal: 0 ([id number])  Complications: No apparent anesthesia complications

## 2017-05-16 ENCOUNTER — Telehealth: Payer: Self-pay | Admitting: Cardiology

## 2017-05-16 ENCOUNTER — Other Ambulatory Visit: Payer: Self-pay | Admitting: Physician Assistant

## 2017-05-16 DIAGNOSIS — I5033 Acute on chronic diastolic (congestive) heart failure: Secondary | ICD-10-CM

## 2017-05-16 DIAGNOSIS — Z23 Encounter for immunization: Secondary | ICD-10-CM | POA: Diagnosis not present

## 2017-05-16 LAB — CBC
HCT: 32.5 % — ABNORMAL LOW (ref 36.0–46.0)
HEMOGLOBIN: 10.1 g/dL — AB (ref 12.0–15.0)
MCH: 29.8 pg (ref 26.0–34.0)
MCHC: 31.1 g/dL (ref 30.0–36.0)
MCV: 95.9 fL (ref 78.0–100.0)
Platelets: 213 10*3/uL (ref 150–400)
RBC: 3.39 MIL/uL — ABNORMAL LOW (ref 3.87–5.11)
RDW: 14.2 % (ref 11.5–15.5)
WBC: 5.9 10*3/uL (ref 4.0–10.5)

## 2017-05-16 LAB — GLUCOSE, CAPILLARY: GLUCOSE-CAPILLARY: 134 mg/dL — AB (ref 65–99)

## 2017-05-16 LAB — APTT: APTT: 39 s — AB (ref 24–36)

## 2017-05-16 MED ORDER — ISOSORBIDE DINITRATE 10 MG PO TABS: 10.0000 mg | ORAL_TABLET | Freq: Two times a day (BID) | ORAL | 3 refills | Status: DC

## 2017-05-16 MED ORDER — HYDRALAZINE HCL 50 MG PO TABS: 50.0000 mg | ORAL_TABLET | Freq: Three times a day (TID) | ORAL | 6 refills | Status: DC

## 2017-05-16 MED ORDER — CARVEDILOL 25 MG PO TABS: 25.0000 mg | ORAL_TABLET | Freq: Two times a day (BID) | ORAL | 3 refills | Status: AC

## 2017-05-16 MED ORDER — NITROGLYCERIN 0.4 MG SL SUBL: 0.4000 mg | SUBLINGUAL_TABLET | SUBLINGUAL | 1 refills | Status: AC | PRN

## 2017-05-16 NOTE — Discharge Summary (Signed)
Discharge Summary    Patient ID: Carrie Key,  MRN: [id number], DOB/AGE: 1940-04-28 77 y.o.  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  Cardioverted at 150J unsuccessful.  200J successful.  Evaluation Findings: Post procedure EKG shows: sinus rhythm with PACs and PVCs. Complications: None Patient did tolerate procedure well.   Left heart cath 05/12/17:  Ost 1st Mrg lesion is 60% stenosed.  There is mild left ventricular systolic dysfunction. The left ventricular ejection fraction is 45-50% by visual estimate.  LV end diastolic pressure is mildly elevated. 15 mmHg  There is mild (1-2+) mitral regurgitation.  There is no aortic valve stenosis.   Angiographically only single vessel moderate disease involving a moderate caliber OM 1-[place]. Best course of action here would be medical management.  Plan: She will return to her nursing unit for ongoing care.  She will restart Eliquis tomorrow morning with plans to continue with cardioversion on Monday.   Echo 05/08/17: ------------------------------------------------------------------- Study Conclusions - Left ventricle: The cavity size was normal. Wall thickness was   increased in a pattern of moderate LVH. Systolic function was   normal. The estimated ejection fraction was in the range  of 55%   to 60%. Wall motion was normal; there were no regional wall   motion abnormalities. The study was not technically sufficient to   allow evaluation of LV diastolic dysfunction due to atrial   fibrillation. - Aortic valve: Trileaflet; mildly thickened, mildly calcified   leaflets. There was mild stenosis. - Aorta: Ascending aortic diameter: 42 mm (S). Mild ascending   aortic dilatation. - Mitral valve: There was mild to moderate regurgitation. - Atrial septum: No defect or patent foramen ovale was identified. - Tricuspid valve: There was mild-moderate regurgitation. - Pulmonary arteries: PA peak pressure: 33 mm Hg (S).    Lexiscan myoview 05/10/17:  There was no ST segment deviation noted during stress.  No T wave inversion was noted during stress.  The left ventricular ejection fraction is moderately decreased (30-44%).  Nuclear stress EF: 42%. There is apical septal wall akinesis.  Defect 1: There is a medium defect of mild severity present in the apex location.  Findings consistent with prior myocardial infarction with peri-infarct ischemia.  This is an intermediate risk study.   History of Present Illness     Carrie Key is a 77 y.o. female with a hx of PAF (on Eliquis-noncompliant), HTN, HLD, CKD stage III, DM type II and history of breast cancer with mastectomy in 2014 who has been seen for the evaluation of atrial fibrillation with RVR.   Carrie Key is a 77 year old female with a history as stated above who was initially seen as a hospital consult in March 2018 with new onset atrial fibrillation with RVR.  After stabilization with diltiazem drip, patient was transitioned to PO diltiazem 240 mg, discharged and followed as outpatient several days later. During her follow-up visit, patient was extremely  fatigued and found to have a heart rate of 43bpm.  At that time, she had not yet picked up her Eliquis or her diltiazem prescriptions. Two weeks later, on 05/27/2016, she  was seen in the office for close follow-up.  Once again, she had not yet picked up her diltiazem prescription.  Additionally, she had run out of her clonidine 0.3 mg.  The decision was made to completely stop diltiazem at that time and restart amlodipine for blood pressure control and start low-dose carvedilol. She has not followed up with cardiology since that time, 05/30/2016.    On 05/07/2017 patient presented to the emergency department with a 3-day complaint of palpitations, exertional dyspnea and increased lower extremity swelling. She reports becoming short of breath with short distances, which are normally not so taxing on her. She denies chest pain, dizziness, or syncope, however states that she has been having issues with orthopnea and fatigue. In the emergency department she was found to be tachycardic with a heart rate in the 130s and slightly tachypneic. An EKG was performed which showed atrial fibrillation with RVR. A Chest x-ray was performed which revealed mild to moderate enlargement of the cardiopericardial silhouette and no overt pulmonary edema, and no active pulmonary disease. Her  iPOC troponin level was 0.04.  BNP level was drawn which was elevated at 405.  Her creatinine is mildly elevated at 1.07.  Patient was admitted to hospitalist service for further treatment.  Hospital Course     Consultants: none  Atrial fibrillation with RVR She presented with 3 days of palpitations and dizziness with DOE. She was previously bradycardic on diltiazem.  She was started on diltiazem drip in the ER for rate control, but this was D/C'ed the next day. Coreg was increased to 25 mg BID. She reports inconsistent use of eliquis.  This patients CHA2DS2-VASc Score and unadjusted Ischemic Stroke Rate (% per year) is equal to 7.2 % stroke rate/year from a score of 5 (age, female, HTN, DM). She underwent TEE/DCCV as above. She remains in NSR. Continue eliquis for at least 4 weeks.    Acute on chronic  diastolic heart failure Preserved EF on echo 03/11/00, diastolic function not assessed secondary to Afib.  BNP was 405. She was diuresed with IV lasix. I&Os not accurate in Epic. Her weight is  236 lbs at discharge, which is actually higher than her 226 lb admission weight. ASA was discontinued in the setting of eliquis. She was discharged on increased dose of coreg. Her home supplemental K was D/C'ed - no duretic regimen and K is 4.0 at discharge.   EKG changes on admission POC troponin negative. EKG with anterior T wave changes which are new from previous. She underwent a lexiscan myovue which was abnormal. She proceeded to heart catheterization which revealed 60% stenosis in the OM1 and mildly elevated LV end diastolic pressure of 15 mmHg. Medical management was recommended. This is likely not the source of her chest pain. Norvac was D/C'ed and nitrate added.    HTN Pressures labile this admission. Increased norvac and hydralazine. Norvasc was subsequently D/C'ed and nitrate started, as above.   Chronic kidney disease stage III Creatinine peaked at 2.02, was 1.07 on admission. Discharge creatinine trending down: 1.29. Repeat BMP in 1 week.   Elevated D-dimer Drawn by primary team, found to be elevated 3.40. V/Q scan without evidence of PE.    HLD LDL goal is less than 70. LDL 04/27/16 was 58. Continue 10 mg crestor. Repeat FLP as  outpatient.    DM Continue home regimen. Follow up with PCP.  _____________  Discharge Vitals Blood pressure (!) 163/88, pulse 74, temperature 98.5 F (36.9 C), temperature source Oral, resp. rate 16, height 5\' 7"  (1.702 m), weight 236 lb 5.3 oz (107.2 kg), SpO2 94 %.  Filed Weights   05/14/17 0643 05/15/17 0410 05/16/17 0430  Weight: 230 lb 6.4 oz (104.5 kg) 236 lb 12.4 oz (107.4 kg) 236 lb 5.3 oz (107.2 kg)    Labs & Radiologic Studies    CBC Recent Labs    05/15/17 0539 05/16/17 0217  WBC 5.6 5.9  HGB 10.9* 10.1*  HCT 34.6* 32.5*  MCV 95.3  95.9  PLT 189 810   Basic Metabolic Panel Recent Labs    05/14/17 0210 05/15/17 0539  NA 137 139  K 4.1 4.0  CL 105 107  CO2 23 22  GLUCOSE 152* 115*  BUN 20 16  CREATININE 1.48* 1.29*  CALCIUM 9.7 9.6   Liver Function Tests No results for input(s): AST, ALT, ALKPHOS, BILITOT, PROT, ALBUMIN in the last 72 hours. No results for input(s): LIPASE, AMYLASE in the last 72 hours. Cardiac Enzymes No results for input(s): CKTOTAL, CKMB, CKMBINDEX, TROPONINI in the last 72 hours. BNP Invalid input(s): POCBNP D-Dimer No results for input(s): DDIMER in the last 72 hours. Hemoglobin A1C No results for input(s): HGBA1C in the last 72 hours. Fasting Lipid Panel No results for input(s): CHOL, HDL, LDLCALC, TRIG, CHOLHDL, LDLDIRECT in the last 72 hours. Thyroid Function Tests No results for input(s): TSH, T4TOTAL, T3FREE, THYROIDAB in the last 72 hours.  Invalid input(s): FREET3 _____________  Dg Chest 2 View  Result Date: 05/13/2017 CLINICAL DATA:  77 year old female with cough and shortness of breath EXAM: CHEST - 2 VIEW COMPARISON:  Prior chest x-ray 05/09/2017 FINDINGS: Stable cardiomegaly. Mediastinum is within normal limits. The lungs are clear. No focal airspace consolidation, evidence of pulmonary edema, pleural effusion or pneumothorax. Multilevel degenerative endplate spurring throughout the thoracic spine. IMPRESSION: 1. Stable chest x-ray without evidence of acute cardiopulmonary disease. 2. Stable cardiomegaly. Electronically Signed   By: Jacqulynn Cadet M.D.   On: 05/13/2017 12:27   Dg Chest 2 View  Result Date: 05/09/2017 CLINICAL DATA:  Dyspnea EXAM: CHEST - 2 VIEW COMPARISON:  05/07/2017 chest radiograph. FINDINGS: Stable cardiomediastinal silhouette with mild cardiomegaly. No pneumothorax. No pleural effusion. Cephalization of the pulmonary vasculature without overt pulmonary edema. No consolidative airspace disease. IMPRESSION: Stable mild cardiomegaly without overt  pulmonary edema. No active pulmonary disease. Electronically Signed   By: Ilona Sorrel M.D.   On: 05/09/2017 20:21   Dg Chest 2 View  Result Date: 05/07/2017 CLINICAL DATA:  Chest pain EXAM: CHEST - 2 VIEW COMPARISON:  03/27/2017 chest radiograph. FINDINGS: Mild to moderate enlargement of the cardiopericardial silhouette, increased in the interval. Otherwise normal mediastinal contour. No pneumothorax. No pleural effusions. No overt pulmonary edema. No acute consolidative airspace disease. IMPRESSION: Mild-to-moderate enlargement of the cardiopericardial silhouette, increased in the interval. No overt pulmonary edema. No active pulmonary disease. Electronically Signed   By: Ilona Sorrel M.D.   On: 05/07/2017 22:47   Nm Myocar Multi W/spect W/wall Motion / Ef  Result Date: 05/10/2017  There was no ST segment deviation noted during stress.  No T wave inversion was noted during stress.  The left ventricular ejection fraction is moderately decreased (30-44%).  Nuclear stress EF: 42%. There is apical septal wall akinesis.  Defect 1: There is a medium defect of mild severity  present in the apex location.  Findings consistent with prior myocardial infarction with peri-infarct ischemia.  This is an intermediate risk study.  Candee Furbish, MD   Nm Pulmonary Perf And Vent  Result Date: 05/13/2017 CLINICAL DATA:  Elevated D-dimer level, clinical concern for pulmonary embolus. Chest fluttering sensation for 3 days with fatigue and shortness of breath. Intermittent chest pain. Atrial fibrillation with tachycardia. EXAM: NUCLEAR MEDICINE VENTILATION - PERFUSION LUNG SCAN TECHNIQUE: Ventilation images were obtained in multiple projections using inhaled aerosol Tc-18m DTPA. Perfusion images were obtained in multiple projections after intravenous injection of Tc-8m-MAA. RADIOPHARMACEUTICALS:  31.7 mCi of Tc-13m DTPA aerosol inhalation and 4.2 mCi Tc46m-MAA IV COMPARISON:  Chest radiograph from 05/13/2017 FINDINGS:  Ventilation: No focal ventilation defect. Incidental note is made of swallowed radiopharmaceutical. Perfusion: No wedge shaped peripheral perfusion defects to suggest acute pulmonary embolism. IMPRESSION: Normal ventilation perfusion lung scan, without evidence of pulmonary embolus. Electronically Signed   By: Van Clines M.D.   On: 05/13/2017 15:03   [place] Ekg Site Rite  Result Date: 05/12/2017 If Site Rite image not attached, placement could not be confirmed due to current cardiac rhythm.  Disposition   Pt is being discharged home today in good condition.  Follow-up Plans & Appointments    Follow-up Information    Isaiah Serge, NP Follow up on 05/29/2017.   Specialties:  Cardiology, Radiology Why:  2:15 pm for TCM Contact information: [place] [address] [phone]        [place] Follow up on 05/22/2017.   Specialty:  Cardiology Why:  BMP for creatinine and K Contact information: [address], [place] [phone]         Discharge Instructions    Diet - low sodium heart healthy   Complete by:  As directed    Discharge instructions   Complete by:  As directed    No driving for 2 days. No lifting over 5 lbs for 1 week. No sexual activity for 1 week. You may return to work in 1 week. Keep procedure site clean & dry. If you notice increased pain, swelling, bleeding or pus, call/return!  You may shower, but no soaking baths/hot tubs/pools for 1 week.   Increase activity slowly   Complete by:  As directed       Discharge Medications   Allergies as of 05/16/2017   No Known Allergies     Medication List    STOP taking these medications   amLODipine 5 MG tablet Commonly known as:  NORVASC   cloNIDine 0.3 MG tablet Commonly known as:  CATAPRES   potassium chloride SA 20 MEQ tablet Commonly known as:  K-DUR,KLOR-CON   pravastatin 10 MG tablet Commonly known as:   PRAVACHOL     TAKE these medications   albuterol 108 (90 Base) MCG/ACT inhaler Commonly known as:  PROVENTIL HFA;VENTOLIN HFA Inhale 1-2 puffs into the lungs every 6 (six) hours as needed for wheezing or shortness of breath.   anastrozole 1 MG tablet Commonly known as:  ARIMIDEX Take 1 tablet (1 mg total) by mouth daily.   apixaban 5 MG Tabs tablet Commonly known as:  ELIQUIS Take 1 tablet (5 mg total) by mouth 2 (two) times daily.   carvedilol 25 MG tablet Commonly known as:  COREG Take 1 tablet (25 mg total) by mouth 2 (two) times daily. What changed:    medication strength  how much to  take   gabapentin 300 MG capsule Commonly known as:  NEURONTIN Take 1 capsule (300 mg total) by mouth at bedtime.   glimepiride 1 MG tablet Commonly known as:  AMARYL Take 1 mg by mouth daily with breakfast.   hydrALAZINE 50 MG tablet Commonly known as:  APRESOLINE Take 1 tablet (50 mg total) by mouth every 8 (eight) hours.   isosorbide dinitrate 10 MG tablet Commonly known as:  ISORDIL Take 1 tablet (10 mg total) by mouth 2 (two) times daily.   meclizine 12.5 MG tablet Commonly known as:  ANTIVERT Take 1 tablet (12.5 mg total) by mouth 3 (three) times daily as needed for dizziness.   nitroGLYCERIN 0.4 MG SL tablet Commonly known as:  NITROSTAT Place 1 tablet (0.4 mg total) under the tongue every 5 (five) minutes as needed for chest pain.   omeprazole 40 MG capsule Commonly known as:  PRILOSEC Take 40 mg by mouth daily.   rosuvastatin 10 MG tablet Commonly known as:  CRESTOR Take 10 mg by mouth daily.   traMADol 50 MG tablet Commonly known as:  ULTRAM Take 1 tablet (50 mg total) by mouth every 6 (six) hours as needed. for pain        Outstanding Labs/Studies   BMP in 1 week   Duration of Discharge Encounter   Greater than 30 minutes including physician time.  Signed, Tami Lin Duke NP 05/16/2017, 10:58 AM

## 2017-05-16 NOTE — Progress Notes (Signed)
    Subjective:  No cardiac complaints this am   Objective:  Vitals:   05/16/17 0008 05/16/17 0430 05/16/17 0539 05/16/17 0700  BP: 133/81 (!) 142/89 (!) 162/78 (!) 163/88  Pulse: 82 77  73  Resp: 16 15  16   Temp: 98.5 F (36.9 C) 98.4 F (36.9 C)  98.5 F (36.9 C)  TempSrc: Oral Oral  Oral  SpO2: 100% 99%  94%  Weight:  236 lb 5.3 oz (107.2 kg)    Height:        Intake/Output from previous day:  Intake/Output Summary (Last 24 hours) at 05/16/2017 0843 Last data filed at 05/15/2017 2155 Gross per 24 hour  Intake 713 ml  Output 100 ml  Net 613 ml    Physical Exam: Affect appropriate Overweight non caucasian female  HEENT: normal Neck supple with no adenopathy JVP normal no bruits no thyromegaly Lungs clear with no wheezing and good diaphragmatic motion Heart:  S1/S2 no murmur, no rub, gallop or click PMI normal Abdomen: benighn, BS positve, no tenderness, no AAA no bruit.  No HSM or HJR Distal pulses intact with no bruits No edema Neuro non-focal Skin warm and dry No muscular weakness   Lab Results: Basic Metabolic Panel: Recent Labs    05/14/17 0210 05/15/17 0539  NA 137 139  K 4.1 4.0  CL 105 107  CO2 23 22  GLUCOSE 152* 115*  BUN 20 16  CREATININE 1.48* 1.29*  CALCIUM 9.7 9.6   Liver Function Tests: No results for input(s): AST, ALT, ALKPHOS, BILITOT, PROT, ALBUMIN in the last 72 hours. No results for input(s): LIPASE, AMYLASE in the last 72 hours. CBC: Recent Labs    05/15/17 0539 05/16/17 0217  WBC 5.6 5.9  HGB 10.9* 10.1*  HCT 34.6* 32.5*  MCV 95.3 95.9  PLT 189 213     Imaging: No results found.  Cardiac Studies:  ECG: post [facility] SR LAE nonspecific ST changes   Telemetry: NSR rates 80's   Echo: EF 55-60% no RWMAls mild to moderate MR LA 34 mm  Medications:   . anastrozole  1 mg Oral Daily  . apixaban  5 mg Oral BID  . carvedilol  25 mg Oral BID WC  . gabapentin  300 mg Oral QHS  . hydrALAZINE  50 mg Oral Q8H  . insulin  aspart  0-5 Units Subcutaneous QHS  . insulin aspart  0-9 Units Subcutaneous TID WC  . isosorbide dinitrate  10 mg Oral BID  . pantoprazole  40 mg Oral Daily  . potassium chloride SA  20 mEq Oral Daily  . rosuvastatin  10 mg Oral q1800  . sodium chloride flush  3 mL Intravenous Q12H     . sodium chloride      Assessment/Plan:  Chest Pain: resolved reviewed cath films and only 60% OM1 doubt angina continue coreg  D/c amlodipine start nitrates add 81 mg ASA given need for anticoagulation  PAF:  Post TEE/DCC maintaining NSR continue eliquis   HLDL with moderate single vessel CAD continue crestor target LDL less than 70  Lab Results  Component Value Date   LDLCALC 58 04/27/2016   D/c home today outpatient f/u Dr Art Buff [facility] 05/16/2017, 8:43 AM

## 2017-05-16 NOTE — Plan of Care (Signed)
Pt met care plan outcomes. Pt will be discharge today 

## 2017-05-16 NOTE — Care Management Note (Signed)
Case Management Note  Patient Details  Name: Kelce Bouton MRN: [id number] Date of Birth: September 06, 1940  Subjective/Objective:  Pt admitted with CP                  Action/Plan:  PTA independent from home with friend.  Pt states her friend will transport her home via private vehicle.  Pt states she has a walker and only uses it outside of the home.  Pt has PCP and denied barriers with obtaining medications - pt was on Eliquis PTA.  No CM needs identified   Expected Discharge Date:  05/16/17               Expected Discharge Plan:  Home/Self Care  In-House Referral:     Discharge planning Services  CM Consult  Post Acute Care Choice:    Choice offered to:     DME Arranged:    DME Agency:     HH Arranged:    HH Agency:     Status of Service:  Completed, signed off  If discussed at [employer] of Stay Meetings, dates discussed:    Additional Comments:  Maryclare Labrador, RN 05/16/2017, 10:09 AM

## 2017-05-16 NOTE — Progress Notes (Signed)
Removed ekg monitor pt going to get dress and wait for her husband to get here.

## 2017-05-16 NOTE — Telephone Encounter (Signed)
Current admit 

## 2017-05-16 NOTE — Care Management Important Message (Signed)
Important Message  Patient Details  Name: Carrie Key MRN: [id number] Date of Birth: [birth year]   Medicare Important Message Given:  Yes    [name] [name] 05/16/2017, 12:34 PM

## 2017-05-16 NOTE — Progress Notes (Signed)
Explained and discussed discharge instructions with patient. Prescriptions sent electronicly to [facility] at [address]. No complaint of pain. Pt going home with husband via w/c with belongings.

## 2017-05-16 NOTE — Telephone Encounter (Signed)
TOC patient-Please call Patient-Patient has an appointment on 05-29-17 with Cecilie Kicks

## 2017-05-17 MED ORDER — APIXABAN 5 MG PO TABS: 5.0000 mg | ORAL_TABLET | Freq: Two times a day (BID) | ORAL | 0 refills | Status: DC

## 2017-05-17 NOTE — Consult Note (Signed)
            [facility] CM Primary Care Navigator  05/17/2017  Eadie Repetto 08-09-1940 [id number]    Attempt toseepatient at the bedside to identify possible discharge needsbutshe was already dischargedhome yesterday.  Per MD note, patient wasadmitted for evaluation and management of atrial fibrillation with rapid ventricular response (new onset) and acute on chronic diastolic congestive HF. Patient had reported inconsistent use of Eliquis.   Primary care provider's office called (Misti for Dr. Glendale Chard) to notify of patient's discharge and need for post hospital follow-up and transition of care. Notified of patient's health issues needing follow-upas well.  Made aware of need to refer patient to [facility] care management as deemed necessary or appropriate for any services.   For additional questions please contact:  Edwena Felty A. Nelda Luckey, BSN, RN-BC [facility] PRIMARY CARE Navigator Cell: [phone]

## 2017-05-17 NOTE — Telephone Encounter (Signed)
Rx sent for 30 days.

## 2017-05-17 NOTE — Telephone Encounter (Signed)
Patient contacted regarding discharge from [facility] on 05/16/2017.  Patient understands to follow up with provider Cecilie Kicks, NP on 05/29/2017 at 2:30 at [facility]. Patient understands discharge instructions? yes Patient understands medications and regiment? yes Patient understands to bring all medications to this visit? yes  The patient stated that she was doing well but she needed a refill of her Eliquis. Message routed to pharmd

## 2017-05-24 DIAGNOSIS — I5031 Acute diastolic (congestive) heart failure: Secondary | ICD-10-CM | POA: Diagnosis not present

## 2017-05-24 DIAGNOSIS — I4891 Unspecified atrial fibrillation: Secondary | ICD-10-CM | POA: Diagnosis not present

## 2017-05-24 DIAGNOSIS — E785 Hyperlipidemia, unspecified: Secondary | ICD-10-CM | POA: Diagnosis not present

## 2017-05-29 ENCOUNTER — Ambulatory Visit (INDEPENDENT_AMBULATORY_CARE_PROVIDER_SITE_OTHER): Payer: Medicare HMO | Admitting: Cardiology

## 2017-05-29 ENCOUNTER — Encounter: Payer: Self-pay | Admitting: Cardiology

## 2017-05-29 VITALS — BP 180/100 | HR 89 | Ht 67.0 in | Wt 240.2 lb

## 2017-05-29 DIAGNOSIS — I5033 Acute on chronic diastolic (congestive) heart failure: Secondary | ICD-10-CM | POA: Diagnosis not present

## 2017-05-29 DIAGNOSIS — N183 Chronic kidney disease, stage 3 unspecified: Secondary | ICD-10-CM

## 2017-05-29 DIAGNOSIS — I480 Paroxysmal atrial fibrillation: Secondary | ICD-10-CM | POA: Diagnosis not present

## 2017-05-29 DIAGNOSIS — I10 Essential (primary) hypertension: Secondary | ICD-10-CM | POA: Diagnosis not present

## 2017-05-29 DIAGNOSIS — E119 Type 2 diabetes mellitus without complications: Secondary | ICD-10-CM

## 2017-05-29 MED ORDER — DILTIAZEM HCL ER COATED BEADS 120 MG PO CP24: 120.0000 mg | ORAL_CAPSULE | Freq: Every day | ORAL | 0 refills | Status: DC

## 2017-05-29 MED ORDER — FUROSEMIDE 40 MG PO TABS: 80.0000 mg | ORAL_TABLET | Freq: Every day | ORAL | 0 refills | Status: DC

## 2017-05-29 NOTE — Patient Instructions (Addendum)
Medication Instructions:  Your physician has recommended you make the following change in your medication:  1.  START Lasix 40 mg taking 2 tablets daily 2.  START Cardizem 120 mg taking 1 tablet daily  Labwork: TODAY:  BMET & PRO BNP  Testing/Procedures: None ordered  Follow-Up: Your physician recommends that you schedule a follow-up appointment in: NEXT Wednesday OR Thursday WITH DR. CRENSHAW OR HIS CARE TEAM APP   Any Other Special Instructions Will Be Listed Below (If Applicable).     If you need a refill on your cardiac medications before your next appointment, please call your pharmacy.

## 2017-05-29 NOTE — Progress Notes (Signed)
Cardiology Office Note   Date:  05/29/2017   ID:  Carrie Key, DOB 1940/03/08, MRN [id number]  PCP:  Glendale Chard, MD  Cardiologist:  Dr. Stanford Breed    Chief Complaint  Patient presents with  . Shortness of Breath      History of Present Illness: Carrie Key is a 77 y.o. female who presents for post hospital and severe SOB.   She has a hx of PAF (on Eliquis-noncompliant),HTN, HLD, CKD stage III, DM type IIand history of breast cancer with mastectomy   First seen in  March 2018 with new onset atrial fibrillation with RVR during hospitalizaation. After stabilization with diltiazem drip, patient was transitioned to POdiltiazem240 mg,discharged and followed as outpatient several days later. During her follow-up visit, patient was extremely fatigued and found to have a heart rate of 43bpm. At that time, she had not yet picked up her Eliquis or her diltiazem prescriptions. Twoweeks later, on 05/27/2016, she was seen in the office for close follow-up. Once again,she had not yet picked up her diltiazem prescription. Additionally, she had run out of her clonidine 0.3 mg. The decision was made to completely stop diltiazem at that time and restart amlodipine for blood pressure control and startlow-dose carvedilol. She has not followed up with cardiology since that time, 05/30/2016.  On 05/07/2017 patient presented to the emergency department with a 3-day complaint of palpitations, exertional dyspnea and increased lower extremity swelling.She reports becoming short of breath with short distances, which are normally not so taxing on her. She denies chest pain, dizziness, or syncope, however states that she has been having issues with orthopnea and fatigue.In the emergency department she was found to be tachycardic with a heart rate in the 130s and slightly tachypneic. An EKG was performed which showed atrial fibrillation with RVR.AChest x-ray was performed which revealed mild to  moderate enlargement of the cardiopericardial silhouette and no overt pulmonary edema, and no active pulmonary disease. [place] troponin level was 0.04. BNP level was drawn which was elevated at 405. Her creatinine was mildly elevated at 1.07. Initially given lasix 20 mg IV BID wt went up with IV fluids for procedures.  Her lasix was held prior to cath.    She did undergo  Cardiac cath 05/12/17 Ost 1st Mrg lesion is 60% stenosed.  There is mild left ventricular systolic dysfunction. The left ventricular ejection fraction is 45-50% by visual estimate.  LV end diastolic pressure is mildly elevated. 15 mmHg  There is mild (1-2+) mitral regurgitation.  There is no aortic valve stenosis.  Angiographically only single vessel moderate disease involving a moderate caliber OM 1-Ramus. Best course of action here would be medical management.   Then she had She underwent TEE DCCV 05/15/17.  That was successful.   Her lasix was held at discharge.  Prior to hospitalization her lasix was 20 mg po but she was not taking.   D/c'd on increase dose of coreg.  Wt at d/c was 236 lbs at d/c.  ASA stopped now on eliquis.    Today she is SOB again with lower ext edema and she is back in a fib.  Her BP is elevated.  She cannot rest at night due to SOB.   With any activity she is SOB.  Her legs are swollen. No chest pain.    Past Medical History:  Diagnosis Date  . Atrial fibrillation ([place])   . Atrial fibrillation with RVR ([place]) 05/08/2017  . Breast cancer ([place])   . Cancer ([place])   .  CKD (chronic kidney disease), stage III ([place]) 07/10/2014  . Diabetes mellitus without complication ([place])   . Dyspnea   . GERD (gastroesophageal reflux disease)   . Heart murmur   . Hyperlipemia   . Hypertension   . Wears glasses     Past Surgical History:  Procedure Laterality Date  . ABDOMINAL HYSTERECTOMY    . BACK SURGERY  2002   lumbar lam  . CARDIAC CATHETERIZATION  2003  . CARDIOVERSION N/A 05/15/2017    Procedure: CARDIOVERSION;  Surgeon: Skeet Latch, MD;  Location: [facility];  Service: Cardiovascular;  Laterality: N/A;  . COLONOSCOPY    . LEFT HEART CATH AND CORONARY ANGIOGRAPHY N/A 05/12/2017   Procedure: LEFT HEART CATH AND CORONARY ANGIOGRAPHY;  Surgeon: Leonie Man, MD;  Location: [facility];  Service: Cardiovascular;  Laterality: N/A;  . SIMPLE MASTECTOMY WITH AXILLARY SENTINEL NODE BIOPSY Right 12/03/2012   Procedure: RIGHT SIMPLE MASTECTOMY WITH RIGHT  AXILLARY SENTINEL LYMPH NODE BIOPSY;  Surgeon: Shann Medal, MD;  Location: [facility];  Service: General;  Laterality: Right;  . TEE WITHOUT CARDIOVERSION N/A 05/15/2017   Procedure: TRANSESOPHAGEAL ECHOCARDIOGRAM (TEE);  Surgeon: Skeet Latch, MD;  Location: [facility];  Service: Cardiovascular;  Laterality: N/A;  . UPPER GI ENDOSCOPY       Current Outpatient Medications  Medication Sig Dispense Refill  . albuterol (PROVENTIL HFA;VENTOLIN HFA) 108 (90 Base) MCG/ACT inhaler Inhale 1-2 puffs into the lungs every 6 (six) hours as needed for wheezing or shortness of breath.    . anastrozole (ARIMIDEX) 1 MG tablet Take 1 tablet (1 mg total) by mouth daily. 30 tablet 11  . apixaban (ELIQUIS) 5 MG TABS tablet Take 1 tablet (5 mg total) by mouth 2 (two) times daily. 60 tablet 0  . carvedilol (COREG) 25 MG tablet Take 1 tablet (25 mg total) by mouth 2 (two) times daily. 180 tablet 3  . gabapentin (NEURONTIN) 300 MG capsule Take 1 capsule (300 mg total) by mouth at bedtime. 30 capsule 6  . glimepiride (AMARYL) 1 MG tablet Take 1 mg by mouth daily with breakfast.    . hydrALAZINE (APRESOLINE) 50 MG tablet Take 1 tablet (50 mg total) by mouth every 8 (eight) hours. 90 tablet 6  . isosorbide dinitrate (ISORDIL) 10 MG tablet Take 1 tablet (10 mg total) by mouth 2 (two) times daily. 180 tablet 3  . meclizine (ANTIVERT) 12.5 MG tablet Take 1 tablet (12.5 mg total) by mouth 3 (three) times daily as needed for  dizziness. 20 tablet 0  . nitroGLYCERIN (NITROSTAT) 0.4 MG SL tablet Place 1 tablet (0.4 mg total) under the tongue every 5 (five) minutes as needed for chest pain. 25 tablet 1  . omeprazole (PRILOSEC) 40 MG capsule Take 40 mg by mouth daily.     . rosuvastatin (CRESTOR) 10 MG tablet Take 10 mg by mouth daily.    . traMADol (ULTRAM) 50 MG tablet Take 1 tablet (50 mg total) by mouth every 6 (six) hours as needed. for pain 90 tablet 0  . diltiazem (CARDIZEM CD) 120 MG 24 hr capsule Take 1 capsule (120 mg total) by mouth daily. 90 capsule 0  . furosemide (LASIX) 40 MG tablet Take 2 tablets (80 mg total) by mouth daily. 180 tablet 0   No current facility-administered medications for this visit.     Allergies:   Patient has no known allergies.    Social History:  The patient  reports that she quit smoking  about 28 years ago. Her smoking use included cigarettes. She has a 9.00 pack-year smoking history. She has never used smokeless tobacco. She reports that she drinks about 2.4 oz of alcohol per week. She reports that she does not use drugs.   Family History:  The patient's family history includes Heart attack in her father; Hypertension in her father, mother, and paternal grandmother; Stroke in her mother.    ROS:  General:no colds or fevers, + weight gain Skin:no rashes or ulcers HEENT:no blurred vision, no congestion CV:see HPI PUL:see HPI GI:no diarrhea constipation or melena, no indigestion GU:no hematuria, no dysuria MS:no joint pain, no claudication Neuro:no syncope, no lightheadedness Endo:+ diabetes, no thyroid disease  Wt Readings from Last 3 Encounters:  05/29/17 240 lb 3.2 oz (109 kg)  05/16/17 236 lb 5.3 oz (107.2 kg)  02/20/17 235 lb 4.8 oz (106.7 kg)     PHYSICAL EXAM: VS:  BP (!) 180/100   Pulse 89   Ht 5\' 7"  (1.702 m)   Wt 240 lb 3.2 oz (109 kg)   SpO2 96%   BMI 37.62 kg/m  , BMI Body mass index is 37.62 kg/m. General:Pleasant affect, NAD Skin:Warm and dry,  brisk capillary refill HEENT:normocephalic, sclera clear, mucus membranes moist Neck:supple, no JVD, no bruits  Heart:irreg irreg without murmur, gallup, rub or click Lungs:clear without rales, rhonchi, or wheezes, somewhat diminished breath sounds   NFA:OZHY, non tender, + BS, do not palpate liver spleen or masses Ext:2-3+ lower ext edema,  2+ radial pulses Neuro:alert and oriented X 3, MAE, follows commands, + facial symmetry    EKG:  EKG is ordered today. The ekg ordered today demonstrates A fib with rate control and no acute ST changes.  The A fib new since 05/16/17.    Recent Labs: 02/20/2017: ALT 19 05/07/2017: B Natriuretic Peptide 405.0 05/08/2017: TSH 2.471 05/09/2017: Magnesium 2.5 05/15/2017: BUN 16; Creatinine, Ser 1.29; Potassium 4.0; Sodium 139 05/16/2017: Hemoglobin 10.1; Platelets 213    Lipid Panel    Component Value Date/Time   CHOL 144 04/27/2016 0510   TRIG 137 04/27/2016 0510   HDL 59 04/27/2016 0510   CHOLHDL 2.4 04/27/2016 0510   VLDL 27 04/27/2016 0510   LDLCALC 58 04/27/2016 0510       Other studies Reviewed: Additional studies/ records that were reviewed today include: . 05/08/17 Echo Study Conclusions  - Left ventricle: The cavity size was normal. Wall thickness was   increased in a pattern of moderate LVH. Systolic function was   normal. The estimated ejection fraction was in the range of 55%   to 60%. Wall motion was normal; there were no regional wall   motion abnormalities. The study was not technically sufficient to   allow evaluation of LV diastolic dysfunction due to atrial   fibrillation. - Aortic valve: Trileaflet; mildly thickened, mildly calcified   leaflets. There was mild stenosis. - Aorta: Ascending aortic diameter: 42 mm (S). Mild ascending   aortic dilatation. - Mitral valve: There was mild to moderate regurgitation. - Atrial septum: No defect or patent foramen ovale was identified. - Tricuspid valve: There was mild-moderate  regurgitation. - Pulmonary arteries: PA peak pressure: 33 mm Hg (S).  Cardiac catheterization 05/12/17 LEFT HEART CATH AND CORONARY ANGIOGRAPHY  Conclusion     Ost 1st Mrg lesion is 60% stenosed.  There is mild left ventricular systolic dysfunction. The left ventricular ejection fraction is 45-50% by visual estimate.  LV end diastolic pressure is mildly elevated. 15 mmHg  There is mild (1-2+) mitral regurgitation.  There is no aortic valve stenosis.   Angiographically only single vessel moderate disease involving a moderate caliber OM 1-Ramus. Best course of action here would be medical management.  Plan: She will return to her nursing unit for ongoing care.  She will restart Eliquis tomorrow morning with plans to continue with cardioversion on Monday.    TEE 05/15/17 and DCCV  Study Conclusions  - Left ventricle: Systolic function was mildly to moderately   reduced. The estimated ejection fraction was in the range of 40%   to 45%. Diffuse hypokinesis. No evidence of thrombus. - Aortic valve: No evidence of vegetation. There was no   regurgitation. - Mitral valve: There was mild regurgitation. - Left atrium: No evidence of thrombus in the atrial cavity or   appendage. No evidence of thrombus in the atrial cavity or   appendage. No evidence of thrombus in the atrial cavity or   appendage. There was spontaneous echo contrast (&quot;smoke&quot;). - Right ventricle: The cavity size was normal. Wall thickness was   normal. Systolic function was normal. - Right atrium: No evidence of thrombus in the atrial cavity or   appendage. - Tricuspid valve: There was mild regurgitation. - Pulmonic valve: No evidence of vegetation.  Impressions:  - Successful cardioversion. No cardiac source of emboli was   indentified.  ASSESSMENT AND PLAN:  1.  SOB/Acute on chronic diastolic HF most likely due to A fib though BP is elevated. --discussed with Dr. Angelena Form --DOD and Pt. with  husband --will add Lasix 40 mg 2 daily --I will check BNP for SOB and BMP with recent mild increase of Cr in hospital.  Pt did not want to go back to hospital if we could control as outpt.   We will see her back the 24th or 25 th of April and if no better then she will be admitted.  If she has improved she will need a fib clinic appt to eval for antiarrythmic.      2.   A fib recurrent with rate controlled in the office. Will add cardizem CD 120 mg to help slow and help with BP- she has not missed any eliquis, no bleeding. Again once BP and edema controlled to a fib clinic for plan , possible antiarrythmic.   3.   HTN uncontrolled, the lasix and cardizem with help bring down.  4.   CKD -3 will check BMP today if Cr rising I explained to pt she may need to be hospitalized for IV diuretics.  5.    Diabetes type 2- per PCP.    Current medicines are reviewed with the patient today.  The patient Has no concerns regarding medicines.  The following changes have been made:  See above Labs/ tests ordered today include:see above  Disposition:   FU:  see above  Signed, Cecilie Kicks, NP  05/29/2017 5:17 PM    [facility] [place], [place], [place], [place] Phone: [phone]; Fax: [phone]

## 2017-05-30 LAB — BASIC METABOLIC PANEL
BUN/Creatinine Ratio: 14 (ref 12–28)
BUN: 16 mg/dL (ref 8–27)
CALCIUM: 9.7 mg/dL (ref 8.7–10.3)
CO2: 21 mmol/L (ref 20–29)
CREATININE: 1.16 mg/dL — AB (ref 0.57–1.00)
Chloride: 105 mmol/L (ref 96–106)
GFR calc Af Amer: 53 mL/min/{1.73_m2} — ABNORMAL LOW (ref >59)
GFR, EST NON AFRICAN AMERICAN: 46 mL/min/{1.73_m2} — AB (ref >59)
GLUCOSE: 113 mg/dL — AB (ref 65–99)
POTASSIUM: 3.8 mmol/L (ref 3.5–5.2)
Sodium: 140 mmol/L (ref 134–144)

## 2017-05-30 LAB — PRO B NATRIURETIC PEPTIDE: NT-PRO BNP: 2966 pg/mL — AB (ref 0–738)

## 2017-06-02 ENCOUNTER — Encounter: Payer: Self-pay | Admitting: Physician Assistant

## 2017-06-02 ENCOUNTER — Ambulatory Visit (INDEPENDENT_AMBULATORY_CARE_PROVIDER_SITE_OTHER): Payer: Medicare HMO | Admitting: Physician Assistant

## 2017-06-02 VITALS — BP 142/90 | HR 60 | Ht 67.0 in | Wt 234.0 lb

## 2017-06-02 DIAGNOSIS — I5032 Chronic diastolic (congestive) heart failure: Secondary | ICD-10-CM | POA: Diagnosis not present

## 2017-06-02 DIAGNOSIS — I10 Essential (primary) hypertension: Secondary | ICD-10-CM | POA: Diagnosis not present

## 2017-06-02 DIAGNOSIS — I4819 Other persistent atrial fibrillation: Secondary | ICD-10-CM

## 2017-06-02 DIAGNOSIS — E119 Type 2 diabetes mellitus without complications: Secondary | ICD-10-CM

## 2017-06-02 DIAGNOSIS — I340 Nonrheumatic mitral (valve) insufficiency: Secondary | ICD-10-CM | POA: Diagnosis not present

## 2017-06-02 DIAGNOSIS — N183 Chronic kidney disease, stage 3 unspecified: Secondary | ICD-10-CM

## 2017-06-02 DIAGNOSIS — I2510 Atherosclerotic heart disease of native coronary artery without angina pectoris: Secondary | ICD-10-CM | POA: Diagnosis not present

## 2017-06-02 DIAGNOSIS — E785 Hyperlipidemia, unspecified: Secondary | ICD-10-CM | POA: Diagnosis not present

## 2017-06-02 DIAGNOSIS — I712 Thoracic aortic aneurysm, without rupture, unspecified: Secondary | ICD-10-CM

## 2017-06-02 DIAGNOSIS — I481 Persistent atrial fibrillation: Secondary | ICD-10-CM

## 2017-06-02 MED ORDER — HYDRALAZINE HCL 100 MG PO TABS: 100.0000 mg | ORAL_TABLET | Freq: Three times a day (TID) | ORAL | 3 refills | Status: AC

## 2017-06-02 MED ORDER — ROSUVASTATIN CALCIUM 10 MG PO TABS: 10.0000 mg | ORAL_TABLET | Freq: Every day | ORAL | 3 refills | Status: DC

## 2017-06-02 NOTE — Progress Notes (Signed)
Cardiology Office Note    Date:  06/04/2017   ID:  Elba Schaber, DOB March 27, 1940, MRN [id number]  PCP:  Glendale Chard, MD  Cardiologist:  Dr. Stanford Breed  Chief Complaint  Patient presents with  . Follow-up    pt complains of SOB, swelling in legs and feet, pt states no chest pain    History of Present Illness:  Carrie Key is a 77 y.o. female with PMH of HTN, HLD, CAD, PAF, CKD stage III, type II DM, and history of breast cancer with simple mastectomy in 2014.  She was seen in the hospital in March 2018 with atrial fibrillation with RVR.  Her chest pain resolved spontaneously with reduction of the heart rate.  She was eventually discharged on Cardizem 240 mg daily and 5 mg twice daily of Eliquis. Her Azilsartan-chlorthalidone and Norvasc were discontinued.  I saw the patient in March 2018, she was very fatigued at the time, EKG showed heart rate was 43.  I reduced her Cardizem dose.  However when I saw her back in April 2018, she never picked up the medication, and though she has also stopped her previous Cardizem completely.  Her heart rate was in the 50s at the time.  I added low-dose carvedilol 3.125 mg twice daily for its minimal rate control effect.  She was supposed to follow-up with [place] back later, this never occurred.  She presented to the hospital on 05/07/2017 with 3-day onset of palpitation, exertional dyspnea and increased lower extremity swelling.  Heart rate was in the 130s at the time, and she was in atrial fibrillation with RVR.  Troponin was borderline at 0.04.  BNP was 405.  D-dimer elevated.  VQ scan was negative.  EKG also showed T wave inversions in anterior leads.  Myoview was abnormal, she underwent a cardiac catheterization which showed EF 45-50%, 60% OM1 lesion, 1-2+ mitral regurgitation.  Medical therapy was recommended.  Echocardiogram obtained on 05/08/2017 showed EF 55 to 60%, moderate LVH, ascending aorta measuring 42 mm, PA pressure 33 mmHg, mild to moderate mitral  regurgitation.  She eventually underwent TEE cardioversion on 05/15/2017.  Carvedilol was eventually increased to 25 mg twice daily.  Discharge weight was 236.  Since her discharge, she has been seen by Cecilie Kicks on 05/29/2017, her weight at that time was 240. She was also complaining of shortness of breath and lower extremity edema.  It was felt patient likely has acute on chronic diastolic heart failure, Lasix was increased to 80 mg daily.  She was also noted to be hypertensive with a blood pressure 180/100.  EKG showed that she has a recurrence of atrial fibrillation, in order to better control her heart rate, Cardizem 120 mg daily was added to her medical regimen.  Patient presents today for cardiology office visit.  Since started on the higher dose of diuretic, she has lost 6 pounds. She is near euvolemic level. Her blood pressure is 142/90 today. Patient is not on potassium supplement, I will check a basic metabolic panel today.  Based on my physical exam today, she remains in atrial fibrillation but irregularly irregular heart rate.  She complains of weakness and shortness of breath since the recurrence of atrial fibrillation.  She seems to be quite symptomatic with atrial fibrillation, I would consider outpatient repeat cardioversion without TEE.  She has been compliant with Eliquis since her discharge.  I will check with Dr. Stanford Breed, if he agrees, we will set the patient up for outpatient DCCV.  I  will also obtain a basic metabolic panel today to make sure her potassium is stable.  Her blood pressure remains elevated, I will increase hydralazine to 100 mg 3 times daily.  Otherwise I will set the patient up to be followed by atrial fibrillation clinic in 3 weeks.   Past Medical History:  Diagnosis Date  . Atrial fibrillation ([place])   . Atrial fibrillation with RVR ([place]) 05/08/2017  . Breast cancer ([place])   . Cancer ([place])   . CKD (chronic kidney disease), stage III ([place]) 07/10/2014  . Diabetes  mellitus without complication ([place])   . Dyspnea   . GERD (gastroesophageal reflux disease)   . Heart murmur   . Hyperlipemia   . Hypertension   . Wears glasses     Past Surgical History:  Procedure Laterality Date  . ABDOMINAL HYSTERECTOMY    . BACK SURGERY  2002   lumbar lam  . CARDIAC CATHETERIZATION  2003  . CARDIOVERSION N/A 05/15/2017   Procedure: CARDIOVERSION;  Surgeon: Skeet Latch, MD;  Location: [place];  Service: Cardiovascular;  Laterality: N/A;  . COLONOSCOPY    . LEFT HEART CATH AND CORONARY ANGIOGRAPHY N/A 05/12/2017   Procedure: LEFT HEART CATH AND CORONARY ANGIOGRAPHY;  Surgeon: Leonie Man, MD;  Location: [facility];  Service: Cardiovascular;  Laterality: N/A;  . SIMPLE MASTECTOMY WITH AXILLARY SENTINEL NODE BIOPSY Right 12/03/2012   Procedure: RIGHT SIMPLE MASTECTOMY WITH RIGHT  AXILLARY SENTINEL LYMPH NODE BIOPSY;  Surgeon: Shann Medal, MD;  Location: [facility];  Service: General;  Laterality: Right;  . TEE WITHOUT CARDIOVERSION N/A 05/15/2017   Procedure: TRANSESOPHAGEAL ECHOCARDIOGRAM (TEE);  Surgeon: Skeet Latch, MD;  Location: [facility];  Service: Cardiovascular;  Laterality: N/A;  . UPPER GI ENDOSCOPY      Current Medications: Outpatient Medications Prior to Visit  Medication Sig Dispense Refill  . albuterol (PROVENTIL HFA;VENTOLIN HFA) 108 (90 Base) MCG/ACT inhaler Inhale 1-2 puffs into the lungs every 6 (six) hours as needed for wheezing or shortness of breath.    . anastrozole (ARIMIDEX) 1 MG tablet Take 1 tablet (1 mg total) by mouth daily. 30 tablet 11  . apixaban (ELIQUIS) 5 MG TABS tablet Take 1 tablet (5 mg total) by mouth 2 (two) times daily. 60 tablet 0  . carvedilol (COREG) 25 MG tablet Take 1 tablet (25 mg total) by mouth 2 (two) times daily. 180 tablet 3  . diltiazem (CARDIZEM CD) 120 MG 24 hr capsule Take 1 capsule (120 mg total) by mouth daily. 90 capsule 0  . furosemide (LASIX) 40 MG tablet Take 2  tablets (80 mg total) by mouth daily. 180 tablet 0  . gabapentin (NEURONTIN) 300 MG capsule Take 1 capsule (300 mg total) by mouth at bedtime. 30 capsule 6  . glimepiride (AMARYL) 1 MG tablet Take 1 mg by mouth daily with breakfast.    . isosorbide dinitrate (ISORDIL) 10 MG tablet Take 1 tablet (10 mg total) by mouth 2 (two) times daily. 180 tablet 3  . meclizine (ANTIVERT) 12.5 MG tablet Take 1 tablet (12.5 mg total) by mouth 3 (three) times daily as needed for dizziness. 20 tablet 0  . nitroGLYCERIN (NITROSTAT) 0.4 MG SL tablet Place 1 tablet (0.4 mg total) under the tongue every 5 (five) minutes as needed for chest pain. 25 tablet 1  . omeprazole (PRILOSEC) 40 MG capsule Take 40 mg by mouth daily.     . traMADol (ULTRAM) 50 MG tablet Take 1 tablet (50  mg total) by mouth every 6 (six) hours as needed. for pain 90 tablet 0  . hydrALAZINE (APRESOLINE) 50 MG tablet Take 1 tablet (50 mg total) by mouth every 8 (eight) hours. 90 tablet 6  . rosuvastatin (CRESTOR) 10 MG tablet Take 10 mg by mouth daily.     No facility-administered medications prior to visit.      Allergies:   Patient has no known allergies.   Social History   Socioeconomic History  . Marital status: Widowed    Spouse name: Not on file  . Number of children: 5  . Years of education: 65  . Highest education level: Not on file  Occupational History  . Occupation: Retired    Comment: disability prior to retirement  Social Needs  . Financial resource strain: Not on file  . Food insecurity:    Worry: Not on file    Inability: Not on file  . Transportation needs:    Medical: Not on file    Non-medical: Not on file  Tobacco Use  . Smoking status: Former Smoker    Packs/day: 0.30    Years: 30.00    Pack years: 9.00    Types: Cigarettes    Last attempt to quit: 11/26/1988    Years since quitting: 28.5  . Smokeless tobacco: Never Used  Substance and Sexual Activity  . Alcohol use: Yes    Alcohol/week: 2.4 oz     Types: 4 Glasses of wine per week    Comment: occ  . Drug use: No  . Sexual activity: Yes    Birth control/protection: Post-menopausal  Lifestyle  . Physical activity:    Days per week: Not on file    Minutes per session: Not on file  . Stress: Not on file  Relationships  . Social connections:    Talks on phone: Not on file    Gets together: Not on file    Attends religious service: Not on file    Active member of club or organization: Not on file    Attends meetings of clubs or organizations: Not on file    Relationship status: Not on file  Other Topics Concern  . Not on file  Social History Narrative   Patient is single and lives alone.   Patient is retired.   Patient has five adult children.   Patient has a 11 grade education   Patient is right-handed.   Patient drinks one cup of coffee and two cups of tea daily.     Family History:  The patient's family history includes Heart attack in her father; Hypertension in her father, mother, and paternal grandmother; Stroke in her mother.   ROS:   Please see the history of present illness.    ROS All other systems reviewed and are negative.   PHYSICAL EXAM:   VS:  BP (!) 142/90 (BP Location: Left Arm)   Pulse 60   Ht 5\' 7"  (1.702 m)   Wt 234 lb (106.1 kg)   BMI 36.65 kg/m    GEN: Well nourished, well developed, in no acute distress  HEENT: normal  Neck: no JVD, carotid bruits, or masses Cardiac: RRR; no murmurs, rubs, or gallops,no edema  Respiratory:  clear to auscultation bilaterally, normal work of breathing GI: soft, nontender, nondistended, + BS MS: no deformity or atrophy  Skin: warm and dry, no rash Neuro:  Alert and Oriented x 3, Strength and sensation are intact Psych: euthymic mood, full affect  Wt Readings from  Last 3 Encounters:  06/02/17 234 lb (106.1 kg)  05/29/17 240 lb 3.2 oz (109 kg)  05/16/17 236 lb 5.3 oz (107.2 kg)      Studies/Labs Reviewed:   EKG:  EKG is not ordered today.    Recent  Labs: 02/20/2017: ALT 19 05/07/2017: B Natriuretic Peptide 405.0 05/08/2017: TSH 2.471 05/09/2017: Magnesium 2.5 05/16/2017: Hemoglobin 10.1; Platelets 213 05/29/2017: BUN 16; Creatinine, Ser 1.16; NT-Pro BNP 2,966; Potassium 3.8; Sodium 140   Lipid Panel    Component Value Date/Time   CHOL 144 04/27/2016 0510   TRIG 137 04/27/2016 0510   HDL 59 04/27/2016 0510   CHOLHDL 2.4 04/27/2016 0510   VLDL 27 04/27/2016 0510   LDLCALC 58 04/27/2016 0510    Additional studies/ records that were reviewed today include:   Echo 05/08/2017 LV EF: 55% -   60%  Study Conclusions  - Left ventricle: The cavity size was normal. Wall thickness was   increased in a pattern of moderate LVH. Systolic function was   normal. The estimated ejection fraction was in the range of 55%   to 60%. Wall motion was normal; there were no regional wall   motion abnormalities. The study was not technically sufficient to   allow evaluation of LV diastolic dysfunction due to atrial   fibrillation. - Aortic valve: Trileaflet; mildly thickened, mildly calcified   leaflets. There was mild stenosis. - Aorta: Ascending aortic diameter: 42 mm (S). Mild ascending   aortic dilatation. - Mitral valve: There was mild to moderate regurgitation. - Atrial septum: No defect or patent foramen ovale was identified. - Tricuspid valve: There was mild-moderate regurgitation. - Pulmonary arteries: PA peak pressure: 33 mm Hg (S).    ASSESSMENT:    1. Persistent atrial fibrillation ([place])   2. Essential hypertension   3. Hyperlipidemia, unspecified hyperlipidemia type   4. Coronary artery disease involving native coronary artery of native heart without angina pectoris   5. CKD (chronic kidney disease), stage III ([place])   6. Controlled type 2 diabetes mellitus without complication, without long-term current use of insulin ([place])   7. Thoracic aortic aneurysm without rupture ([place])   8. Non-rheumatic mitral regurgitation   9. Chronic  diastolic heart failure ([facility])      PLAN:  In order of problems listed above:  1. Persistent atrial fibrillation: Despite recent TEE DCCV, patient had recurrent atrial fibrillation.  She is very symptomatic weakness and shortness of breath despite she is approaching euvolemic level after diuresis.  Since she was discharged, diltiazem CD 120 mg daily was added during the last office visit by Cecilie Kicks, I think we can attempt DC cardioversion again.  I will discuss with her primary cardiologist Dr. Stanford Breed and potentially set her up for outpatient DCCV.  We will also set her up to see atrial fibrillation clinic in 3 weeks.  2. Chronic diastolic heart failure: She was volume overloaded during the last office visit, Lasix was increased to 80 mg daily.  She does not appears to have any potassium supplement.  I will obtain a basic metabolic panel to check the potassium level and renal function.  3. CAD: Nonobstructive CAD on recent cardiac catheterization.  Not on aspirin given the need for Eliquis.  4. Hypertension: Blood pressure mildly elevated today, increase hydralazine to 100 mg 3 times daily.  5. Hyperlipidemia: on Crestor 10 mg daily.  6. CKD stage III: Basic metabolic panel today.  7. DM 2: Measured by primary care provider, on glimepiride at home  8. Thoracic aortic aneurysm: Measuring 42 mm on recent echocardiogram.  9. Mitral regurgitation: Mild to moderate by echocardiogram    Medication Adjustments/Labs and Tests Ordered: Current medicines are reviewed at length with the patient today.  Concerns regarding medicines are outlined above.  Medication changes, Labs and Tests ordered today are listed in the Patient Instructions below. Patient Instructions  Medication Instructions: Almyra Deforest, PA-C has recommended making the following medication changes: 1. INCREASE Hydralazine to 100 mg three times daily  Labwork: Your physician recommends that you return for lab work  TODAY.  Testing/Procedures: NONE ORDERED  Follow-up: Your physician recommends that you schedule a follow-up appointment in 3 weeks at the a-fib clinic.  Isaac Laud recommends that you schedule a follow-up appointment in 3-4 months with Dr Stanford Breed.  If you need a refill on your cardiac medications before your next appointment, please call your pharmacy.    Hilbert Corrigan, [place]  06/04/2017 10:44 AM    [address], [place], [place]  [address] Phone: [phone]; Fax: [phone]

## 2017-06-02 NOTE — H&P (View-Only) (Signed)
Cardiology Office Note    Date:  06/04/2017   ID:  Carrie Key, DOB 1940/06/22, MRN [id number]  PCP:  Glendale Chard, MD  Cardiologist:  Dr. Stanford Breed  Chief Complaint  Patient presents with  . Follow-up    pt complains of SOB, swelling in legs and feet, pt states no chest pain    History of Present Illness:  Carrie Key is a 77 y.o. female with PMH of HTN, HLD, CAD, PAF, CKD stage III, type II DM, and history of breast cancer with simple mastectomy in 2014.  She was seen in the hospital in March 2018 with atrial fibrillation with RVR.  Her chest pain resolved spontaneously with reduction of the heart rate.  She was eventually discharged on Cardizem 240 mg daily and 5 mg twice daily of Eliquis. Her Azilsartan-chlorthalidone and Norvasc were discontinued.  I saw the patient in March 2018, she was very fatigued at the time, EKG showed heart rate was 43.  I reduced her Cardizem dose.  However when I saw her back in April 2018, she never picked up the medication, and though she has also stopped her previous Cardizem completely.  Her heart rate was in the 50s at the time.  I added low-dose carvedilol 3.125 mg twice daily for its minimal rate control effect.  She was supposed to follow-up with [place] back later, this never occurred.  She presented to the hospital on 05/07/2017 with 3-day onset of palpitation, exertional dyspnea and increased lower extremity swelling.  Heart rate was in the 130s at the time, and she was in atrial fibrillation with RVR.  Troponin was borderline at 0.04.  BNP was 405.  D-dimer elevated.  VQ scan was negative.  EKG also showed T wave inversions in anterior leads.  Myoview was abnormal, she underwent a cardiac catheterization which showed EF 45-50%, 60% OM1 lesion, 1-2+ mitral regurgitation.  Medical therapy was recommended.  Echocardiogram obtained on 05/08/2017 showed EF 55 to 60%, moderate LVH, ascending aorta measuring 42 mm, PA pressure 33 mmHg, mild to moderate mitral  regurgitation.  She eventually underwent TEE cardioversion on 05/15/2017.  Carvedilol was eventually increased to 25 mg twice daily.  Discharge weight was 236.  Since her discharge, she has been seen by Cecilie Kicks on 05/29/2017, her weight at that time was 240. She was also complaining of shortness of breath and lower extremity edema.  It was felt patient likely has acute on chronic diastolic heart failure, Lasix was increased to 80 mg daily.  She was also noted to be hypertensive with a blood pressure 180/100.  EKG showed that she has a recurrence of atrial fibrillation, in order to better control her heart rate, Cardizem 120 mg daily was added to her medical regimen.  Patient presents today for cardiology office visit.  Since started on the higher dose of diuretic, she has lost 6 pounds. She is near euvolemic level. Her blood pressure is 142/90 today. Patient is not on potassium supplement, I will check a basic metabolic panel today.  Based on my physical exam today, she remains in atrial fibrillation but irregularly irregular heart rate.  She complains of weakness and shortness of breath since the recurrence of atrial fibrillation.  She seems to be quite symptomatic with atrial fibrillation, I would consider outpatient repeat cardioversion without TEE.  She has been compliant with Eliquis since her discharge.  I will check with Dr. Stanford Breed, if he agrees, we will set the patient up for outpatient DCCV.  I  will also obtain a basic metabolic panel today to make sure her potassium is stable.  Her blood pressure remains elevated, I will increase hydralazine to 100 mg 3 times daily.  Otherwise I will set the patient up to be followed by atrial fibrillation clinic in 3 weeks.   Past Medical History:  Diagnosis Date  . Atrial fibrillation ([place])   . Atrial fibrillation with RVR ([place]) 05/08/2017  . Breast cancer ([place])   . Cancer ([place])   . CKD (chronic kidney disease), stage III ([place]) 07/10/2014  . Diabetes  mellitus without complication ([place])   . Dyspnea   . GERD (gastroesophageal reflux disease)   . Heart murmur   . Hyperlipemia   . Hypertension   . Wears glasses     Past Surgical History:  Procedure Laterality Date  . ABDOMINAL HYSTERECTOMY    . BACK SURGERY  2002   lumbar lam  . CARDIAC CATHETERIZATION  2003  . CARDIOVERSION N/A 05/15/2017   Procedure: CARDIOVERSION;  Surgeon: Skeet Latch, MD;  Location: [facility];  Service: Cardiovascular;  Laterality: N/A;  . COLONOSCOPY    . LEFT HEART CATH AND CORONARY ANGIOGRAPHY N/A 05/12/2017   Procedure: LEFT HEART CATH AND CORONARY ANGIOGRAPHY;  Surgeon: Leonie Man, MD;  Location: [facility];  Service: Cardiovascular;  Laterality: N/A;  . SIMPLE MASTECTOMY WITH AXILLARY SENTINEL NODE BIOPSY Right 12/03/2012   Procedure: RIGHT SIMPLE MASTECTOMY WITH RIGHT  AXILLARY SENTINEL LYMPH NODE BIOPSY;  Surgeon: Shann Medal, MD;  Location: [facility];  Service: General;  Laterality: Right;  . TEE WITHOUT CARDIOVERSION N/A 05/15/2017   Procedure: TRANSESOPHAGEAL ECHOCARDIOGRAM (TEE);  Surgeon: Skeet Latch, MD;  Location: [facility];  Service: Cardiovascular;  Laterality: N/A;  . UPPER GI ENDOSCOPY      Current Medications: Outpatient Medications Prior to Visit  Medication Sig Dispense Refill  . albuterol (PROVENTIL HFA;VENTOLIN HFA) 108 (90 Base) MCG/ACT inhaler Inhale 1-2 puffs into the lungs every 6 (six) hours as needed for wheezing or shortness of breath.    . anastrozole (ARIMIDEX) 1 MG tablet Take 1 tablet (1 mg total) by mouth daily. 30 tablet 11  . apixaban (ELIQUIS) 5 MG TABS tablet Take 1 tablet (5 mg total) by mouth 2 (two) times daily. 60 tablet 0  . carvedilol (COREG) 25 MG tablet Take 1 tablet (25 mg total) by mouth 2 (two) times daily. 180 tablet 3  . diltiazem (CARDIZEM CD) 120 MG 24 hr capsule Take 1 capsule (120 mg total) by mouth daily. 90 capsule 0  . furosemide (LASIX) 40 MG tablet Take 2  tablets (80 mg total) by mouth daily. 180 tablet 0  . gabapentin (NEURONTIN) 300 MG capsule Take 1 capsule (300 mg total) by mouth at bedtime. 30 capsule 6  . glimepiride (AMARYL) 1 MG tablet Take 1 mg by mouth daily with breakfast.    . isosorbide dinitrate (ISORDIL) 10 MG tablet Take 1 tablet (10 mg total) by mouth 2 (two) times daily. 180 tablet 3  . meclizine (ANTIVERT) 12.5 MG tablet Take 1 tablet (12.5 mg total) by mouth 3 (three) times daily as needed for dizziness. 20 tablet 0  . nitroGLYCERIN (NITROSTAT) 0.4 MG SL tablet Place 1 tablet (0.4 mg total) under the tongue every 5 (five) minutes as needed for chest pain. 25 tablet 1  . omeprazole (PRILOSEC) 40 MG capsule Take 40 mg by mouth daily.     . traMADol (ULTRAM) 50 MG tablet Take 1 tablet (50  mg total) by mouth every 6 (six) hours as needed. for pain 90 tablet 0  . hydrALAZINE (APRESOLINE) 50 MG tablet Take 1 tablet (50 mg total) by mouth every 8 (eight) hours. 90 tablet 6  . rosuvastatin (CRESTOR) 10 MG tablet Take 10 mg by mouth daily.     No facility-administered medications prior to visit.      Allergies:   Patient has no known allergies.   Social History   Socioeconomic History  . Marital status: Widowed    Spouse name: Not on file  . Number of children: 5  . Years of education: 28  . Highest education level: Not on file  Occupational History  . Occupation: Retired    Comment: disability prior to retirement  Social Needs  . Financial resource strain: Not on file  . Food insecurity:    Worry: Not on file    Inability: Not on file  . Transportation needs:    Medical: Not on file    Non-medical: Not on file  Tobacco Use  . Smoking status: Former Smoker    Packs/day: 0.30    Years: 30.00    Pack years: 9.00    Types: Cigarettes    Last attempt to quit: 11/26/1988    Years since quitting: 28.5  . Smokeless tobacco: Never Used  Substance and Sexual Activity  . Alcohol use: Yes    Alcohol/week: 2.4 oz     Types: 4 Glasses of wine per week    Comment: occ  . Drug use: No  . Sexual activity: Yes    Birth control/protection: Post-menopausal  Lifestyle  . Physical activity:    Days per week: Not on file    Minutes per session: Not on file  . Stress: Not on file  Relationships  . Social connections:    Talks on phone: Not on file    Gets together: Not on file    Attends religious service: Not on file    Active member of club or organization: Not on file    Attends meetings of clubs or organizations: Not on file    Relationship status: Not on file  Other Topics Concern  . Not on file  Social History Narrative   Patient is single and lives alone.   Patient is retired.   Patient has five adult children.   Patient has a 11 grade education   Patient is right-handed.   Patient drinks one cup of coffee and two cups of tea daily.     Family History:  The patient's family history includes Heart attack in her father; Hypertension in her father, mother, and paternal grandmother; Stroke in her mother.   ROS:   Please see the history of present illness.    ROS All other systems reviewed and are negative.   PHYSICAL EXAM:   VS:  BP (!) 142/90 (BP Location: Left Arm)   Pulse 60   Ht 5\' 7"  (1.702 m)   Wt 234 lb (106.1 kg)   BMI 36.65 kg/m    GEN: Well nourished, well developed, in no acute distress  HEENT: normal  Neck: no JVD, carotid bruits, or masses Cardiac: RRR; no murmurs, rubs, or gallops,no edema  Respiratory:  clear to auscultation bilaterally, normal work of breathing GI: soft, nontender, nondistended, + BS MS: no deformity or atrophy  Skin: warm and dry, no rash Neuro:  Alert and Oriented x 3, Strength and sensation are intact Psych: euthymic mood, full affect  Wt Readings from  Last 3 Encounters:  06/02/17 234 lb (106.1 kg)  05/29/17 240 lb 3.2 oz (109 kg)  05/16/17 236 lb 5.3 oz (107.2 kg)      Studies/Labs Reviewed:   EKG:  EKG is not ordered today.    Recent  Labs: 02/20/2017: ALT 19 05/07/2017: B Natriuretic Peptide 405.0 05/08/2017: TSH 2.471 05/09/2017: Magnesium 2.5 05/16/2017: Hemoglobin 10.1; Platelets 213 05/29/2017: BUN 16; Creatinine, Ser 1.16; NT-Pro BNP 2,966; Potassium 3.8; Sodium 140   Lipid Panel    Component Value Date/Time   CHOL 144 04/27/2016 0510   TRIG 137 04/27/2016 0510   HDL 59 04/27/2016 0510   CHOLHDL 2.4 04/27/2016 0510   VLDL 27 04/27/2016 0510   LDLCALC 58 04/27/2016 0510    Additional studies/ records that were reviewed today include:   Echo 05/08/2017 LV EF: 55% -   60%  Study Conclusions  - Left ventricle: The cavity size was normal. Wall thickness was   increased in a pattern of moderate LVH. Systolic function was   normal. The estimated ejection fraction was in the range of 55%   to 60%. Wall motion was normal; there were no regional wall   motion abnormalities. The study was not technically sufficient to   allow evaluation of LV diastolic dysfunction due to atrial   fibrillation. - Aortic valve: Trileaflet; mildly thickened, mildly calcified   leaflets. There was mild stenosis. - Aorta: Ascending aortic diameter: 42 mm (S). Mild ascending   aortic dilatation. - Mitral valve: There was mild to moderate regurgitation. - Atrial septum: No defect or patent foramen ovale was identified. - Tricuspid valve: There was mild-moderate regurgitation. - Pulmonary arteries: PA peak pressure: 33 mm Hg (S).    ASSESSMENT:    1. Persistent atrial fibrillation ([place])   2. Essential hypertension   3. Hyperlipidemia, unspecified hyperlipidemia type   4. Coronary artery disease involving native coronary artery of native heart without angina pectoris   5. CKD (chronic kidney disease), stage III ([place])   6. Controlled type 2 diabetes mellitus without complication, without long-term current use of insulin ([place])   7. Thoracic aortic aneurysm without rupture ([place])   8. Non-rheumatic mitral regurgitation   9. Chronic  diastolic heart failure ([facility])      PLAN:  In order of problems listed above:  1. Persistent atrial fibrillation: Despite recent TEE DCCV, patient had recurrent atrial fibrillation.  She is very symptomatic weakness and shortness of breath despite she is approaching euvolemic level after diuresis.  Since she was discharged, diltiazem CD 120 mg daily was added during the last office visit by Cecilie Kicks, I think we can attempt DC cardioversion again.  I will discuss with her primary cardiologist Dr. Stanford Breed and potentially set her up for outpatient DCCV.  We will also set her up to see atrial fibrillation clinic in 3 weeks.  2. Chronic diastolic heart failure: She was volume overloaded during the last office visit, Lasix was increased to 80 mg daily.  She does not appears to have any potassium supplement.  I will obtain a basic metabolic panel to check the potassium level and renal function.  3. CAD: Nonobstructive CAD on recent cardiac catheterization.  Not on aspirin given the need for Eliquis.  4. Hypertension: Blood pressure mildly elevated today, increase hydralazine to 100 mg 3 times daily.  5. Hyperlipidemia: on Crestor 10 mg daily.  6. CKD stage III: Basic metabolic panel today.  7. DM 2: Measured by primary care provider, on glimepiride at home  8. Thoracic aortic aneurysm: Measuring 42 mm on recent echocardiogram.  9. Mitral regurgitation: Mild to moderate by echocardiogram    Medication Adjustments/Labs and Tests Ordered: Current medicines are reviewed at length with the patient today.  Concerns regarding medicines are outlined above.  Medication changes, Labs and Tests ordered today are listed in the Patient Instructions below. Patient Instructions  Medication Instructions: Almyra Deforest, PA-C has recommended making the following medication changes: 1. INCREASE Hydralazine to 100 mg three times daily  Labwork: Your physician recommends that you return for lab work  TODAY.  Testing/Procedures: NONE ORDERED  Follow-up: Your physician recommends that you schedule a follow-up appointment in 3 weeks at the a-fib clinic.  Isaac Laud recommends that you schedule a follow-up appointment in 3-4 months with Dr Stanford Breed.  If you need a refill on your cardiac medications before your next appointment, please call your pharmacy.    Hilbert Corrigan, [place]  06/04/2017 10:44 AM    [address] [place], [place],   [address] Phone: [phone]; Fax: [phone]

## 2017-06-02 NOTE — Patient Instructions (Signed)
Medication Instructions: Almyra Deforest, PA-C has recommended making the following medication changes: 1. INCREASE Hydralazine to 100 mg three times daily  Labwork: Your physician recommends that you return for lab work TODAY.  Testing/Procedures: NONE ORDERED  Follow-up: Your physician recommends that you schedule a follow-up appointment in 3 weeks at the a-fib clinic.  Isaac Laud recommends that you schedule a follow-up appointment in 3-4 months with Dr Stanford Breed.  If you need a refill on your cardiac medications before your next appointment, please call your pharmacy.

## 2017-06-04 ENCOUNTER — Encounter: Payer: Self-pay | Admitting: Physician Assistant

## 2017-06-06 ENCOUNTER — Other Ambulatory Visit: Payer: Medicare HMO

## 2017-06-06 DIAGNOSIS — I5033 Acute on chronic diastolic (congestive) heart failure: Secondary | ICD-10-CM | POA: Diagnosis not present

## 2017-06-06 DIAGNOSIS — C50411 Malignant neoplasm of upper-outer quadrant of right female breast: Secondary | ICD-10-CM

## 2017-06-06 DIAGNOSIS — N183 Chronic kidney disease, stage 3 unspecified: Secondary | ICD-10-CM

## 2017-06-06 DIAGNOSIS — Z170 Estrogen receptor positive status [ER+]: Secondary | ICD-10-CM

## 2017-06-06 DIAGNOSIS — I480 Paroxysmal atrial fibrillation: Secondary | ICD-10-CM

## 2017-06-07 ENCOUNTER — Telehealth: Payer: Self-pay | Admitting: Physician Assistant

## 2017-06-07 ENCOUNTER — Other Ambulatory Visit: Payer: Self-pay | Admitting: Physician Assistant

## 2017-06-07 LAB — BASIC METABOLIC PANEL
BUN / CREAT RATIO: 11 — AB (ref 12–28)
BUN: 19 mg/dL (ref 8–27)
CO2: 24 mmol/L (ref 20–29)
Calcium: 9.6 mg/dL (ref 8.7–10.3)
Chloride: 100 mmol/L (ref 96–106)
Creatinine, Ser: 1.75 mg/dL — ABNORMAL HIGH (ref 0.57–1.00)
GFR calc Af Amer: 32 mL/min/{1.73_m2} — ABNORMAL LOW (ref >59)
GFR calc non Af Amer: 28 mL/min/{1.73_m2} — ABNORMAL LOW (ref >59)
Glucose: 105 mg/dL — ABNORMAL HIGH (ref 65–99)
Potassium: 3.5 mmol/L (ref 3.5–5.2)
SODIUM: 141 mmol/L (ref 134–144)

## 2017-06-07 NOTE — Telephone Encounter (Signed)
I have called the patient and instructed her to decrease lasix to 40mg  daily, also discussed with cardmaster who will place the patient on schedule for outpatient DCCV next Tue  Signed, Blakely Gluth PA Pager: [phone]

## 2017-06-08 ENCOUNTER — Telehealth: Payer: Self-pay | Admitting: Physician Assistant

## 2017-06-08 ENCOUNTER — Ambulatory Visit: Payer: Medicare HMO | Admitting: Adult Health

## 2017-06-08 DIAGNOSIS — Z7901 Long term (current) use of anticoagulants: Secondary | ICD-10-CM | POA: Diagnosis not present

## 2017-06-08 DIAGNOSIS — Z86010 Personal history of colonic polyps: Secondary | ICD-10-CM | POA: Diagnosis not present

## 2017-06-08 DIAGNOSIS — I482 Chronic atrial fibrillation: Secondary | ICD-10-CM | POA: Diagnosis not present

## 2017-06-08 DIAGNOSIS — D638 Anemia in other chronic diseases classified elsewhere: Secondary | ICD-10-CM | POA: Diagnosis not present

## 2017-06-08 NOTE — Telephone Encounter (Signed)
Her DCCV is on Tue May 7th at [facility], I have informed her that she need to arrive 2 hours early by 11AM at [facility]. She will hold glimepiride, but can take medications with sips of water. No food or large quantity of drink prior to the procedure.   Hilbert Corrigan PA Pager: [phone]

## 2017-06-13 ENCOUNTER — Encounter (HOSPITAL_COMMUNITY): Payer: Self-pay

## 2017-06-13 ENCOUNTER — Ambulatory Visit (HOSPITAL_COMMUNITY)
Admission: RE | Admit: 2017-06-13 | Discharge: 2017-06-13 | Disposition: A | Discharge status: Home / Self Care | Payer: Medicare HMO | Source: Ambulatory Visit | Attending: Cardiovascular Disease | Admitting: Cardiovascular Disease

## 2017-06-13 ENCOUNTER — Encounter (HOSPITAL_COMMUNITY)
Admission: RE | Disposition: A | Discharge status: Home / Self Care | Payer: Self-pay | Source: Ambulatory Visit | Attending: Cardiovascular Disease

## 2017-06-13 ENCOUNTER — Ambulatory Visit (HOSPITAL_COMMUNITY): Payer: Medicare HMO | Admitting: Certified Registered"

## 2017-06-13 ENCOUNTER — Other Ambulatory Visit: Payer: Self-pay

## 2017-06-13 DIAGNOSIS — I340 Nonrheumatic mitral (valve) insufficiency: Secondary | ICD-10-CM | POA: Diagnosis not present

## 2017-06-13 DIAGNOSIS — I712 Thoracic aortic aneurysm, without rupture: Secondary | ICD-10-CM | POA: Diagnosis not present

## 2017-06-13 DIAGNOSIS — Z8249 Family history of ischemic heart disease and other diseases of the circulatory system: Secondary | ICD-10-CM | POA: Diagnosis not present

## 2017-06-13 DIAGNOSIS — I5033 Acute on chronic diastolic (congestive) heart failure: Secondary | ICD-10-CM | POA: Diagnosis not present

## 2017-06-13 DIAGNOSIS — I481 Persistent atrial fibrillation: Secondary | ICD-10-CM | POA: Diagnosis not present

## 2017-06-13 DIAGNOSIS — I130 Hypertensive heart and chronic kidney disease with heart failure and stage 1 through stage 4 chronic kidney disease, or unspecified chronic kidney disease: Secondary | ICD-10-CM | POA: Diagnosis not present

## 2017-06-13 DIAGNOSIS — N183 Chronic kidney disease, stage 3 (moderate): Secondary | ICD-10-CM | POA: Insufficient documentation

## 2017-06-13 DIAGNOSIS — Z79899 Other long term (current) drug therapy: Secondary | ICD-10-CM | POA: Diagnosis not present

## 2017-06-13 DIAGNOSIS — K219 Gastro-esophageal reflux disease without esophagitis: Secondary | ICD-10-CM | POA: Insufficient documentation

## 2017-06-13 DIAGNOSIS — I5032 Chronic diastolic (congestive) heart failure: Secondary | ICD-10-CM | POA: Insufficient documentation

## 2017-06-13 DIAGNOSIS — I480 Paroxysmal atrial fibrillation: Secondary | ICD-10-CM | POA: Diagnosis not present

## 2017-06-13 DIAGNOSIS — Z7901 Long term (current) use of anticoagulants: Secondary | ICD-10-CM | POA: Insufficient documentation

## 2017-06-13 DIAGNOSIS — I4891 Unspecified atrial fibrillation: Secondary | ICD-10-CM | POA: Diagnosis not present

## 2017-06-13 DIAGNOSIS — E1122 Type 2 diabetes mellitus with diabetic chronic kidney disease: Secondary | ICD-10-CM | POA: Insufficient documentation

## 2017-06-13 DIAGNOSIS — I2510 Atherosclerotic heart disease of native coronary artery without angina pectoris: Secondary | ICD-10-CM | POA: Insufficient documentation

## 2017-06-13 DIAGNOSIS — Z87891 Personal history of nicotine dependence: Secondary | ICD-10-CM | POA: Insufficient documentation

## 2017-06-13 DIAGNOSIS — I4819 Other persistent atrial fibrillation: Secondary | ICD-10-CM

## 2017-06-13 HISTORY — PX: CARDIOVERSION: SHX1299

## 2017-06-13 LAB — GLUCOSE, CAPILLARY: GLUCOSE-CAPILLARY: 117 mg/dL — AB (ref 65–99)

## 2017-06-13 SURGERY — CARDIOVERSION
Anesthesia: General

## 2017-06-13 MED ORDER — SODIUM CHLORIDE 0.9 % IV SOLN
INTRAVENOUS | Status: DC
  Administered 2017-06-13: 11:00:00 via INTRAVENOUS

## 2017-06-13 MED ORDER — PROPOFOL 10 MG/ML IV BOLUS
INTRAVENOUS | Status: DC | PRN
  Administered 2017-06-13: 70 mg via INTRAVENOUS

## 2017-06-13 MED ORDER — APIXABAN 5 MG PO TABS
5.0000 mg | ORAL_TABLET | Freq: Two times a day (BID) | ORAL | Status: DC
  Administered 2017-06-13: 5 mg via ORAL
  Filled 2017-06-13 (×2): qty 1

## 2017-06-13 MED ORDER — LIDOCAINE HCL (CARDIAC) PF 100 MG/5ML IV SOSY
PREFILLED_SYRINGE | INTRAVENOUS | Status: DC | PRN
  Administered 2017-06-13: 100 mg via INTRATRACHEAL

## 2017-06-13 NOTE — Interval H&P Note (Signed)
History and Physical Interval Note:  06/13/2017 10:40 AM  Carrie Key  has presented today for surgery, with the diagnosis of AFIB  The various methods of treatment have been discussed with the patient and family. After consideration of risks, benefits and other options for treatment, the patient has consented to  Procedure(s): CARDIOVERSION (N/A) as a surgical intervention .  The patient's history has been reviewed, patient examined, no change in status, stable for surgery.  I have reviewed the patient's chart and labs.  Questions were answered to the patient's satisfaction.     Jenkins Rouge

## 2017-06-13 NOTE — Anesthesia Postprocedure Evaluation (Signed)
Anesthesia Post Note  Patient: Carrie Key  Procedure(s) Performed: CARDIOVERSION (N/A )     Patient location during evaluation: Endoscopy Anesthesia Type: General Level of consciousness: awake and alert Pain management: pain level controlled Vital Signs Assessment: post-procedure vital signs reviewed and stable Respiratory status: spontaneous breathing, nonlabored ventilation, respiratory function stable and patient connected to nasal cannula oxygen Cardiovascular status: blood pressure returned to baseline and stable Postop Assessment: no apparent nausea or vomiting Anesthetic complications: no    Last Vitals:  Vitals:   06/13/17 1145 06/13/17 1150  BP:    Pulse: 66 66  Resp: 15 17  Temp:    SpO2: 97% 98%    Last Pain:  Vitals:   06/13/17 1150  TempSrc:   PainSc: 0-No pain                 Barnet Glasgow

## 2017-06-13 NOTE — Discharge Instructions (Signed)

## 2017-06-13 NOTE — Transfer of Care (Signed)
Immediate Anesthesia Transfer of Care Note  Patient: Carrie Key  Procedure(s) Performed: CARDIOVERSION (N/A )  Patient Location: PACU and Endoscopy Unit  Anesthesia Type:General  Level of Consciousness: patient cooperative and responds to stimulation  Airway & Oxygen Therapy: Patient Spontanous Breathing  Post-op Assessment: Report given to RN and Post -op Vital signs reviewed and stable  Post vital signs: Reviewed and stable  Last Vitals:  Vitals Value Taken Time  BP    Temp    Pulse    Resp    SpO2      Last Pain:  Vitals:   06/13/17 1047  TempSrc: Oral  PainSc: 7          Complications: No apparent anesthesia complications

## 2017-06-13 NOTE — CV Procedure (Signed)
Anesthesia:  Propofol On Rx Eliquis with no missed doses  DCC x 1 150 J biphasic Converted from afib rate 92 to NSR rate 80 No immediate neurologic sequelae  Jenkins Rouge

## 2017-06-13 NOTE — Anesthesia Preprocedure Evaluation (Addendum)
Anesthesia Evaluation  Patient identified by MRN, date of birth, ID band Patient awake    Reviewed: Allergy & Precautions, NPO status , Patient's Chart, lab work & pertinent test results  Airway Mallampati: II  TM Distance: >3 FB Neck ROM: Full    Dental no notable dental hx.    Pulmonary neg pulmonary ROS, former smoker,    Pulmonary exam normal breath sounds clear to auscultation       Cardiovascular hypertension, Pt. on home beta blockers +CHF  Normal cardiovascular exam+ dysrhythmias Atrial Fibrillation  Rhythm:Regular Rate:Normal     Neuro/Psych negative neurological ROS  negative psych ROS   GI/Hepatic negative GI ROS, Neg liver ROS,   Endo/Other  diabetes, Type 2Morbid obesity  Renal/GU negative Renal ROS  negative genitourinary   Musculoskeletal negative musculoskeletal ROS (+)   Abdominal (+) + obese,   Peds negative pediatric ROS (+)  Hematology negative hematology ROS (+)   Anesthesia Other Findings   Reproductive/Obstetrics negative OB ROS                            Anesthesia Physical Anesthesia Plan  ASA: IV  Anesthesia Plan: General   Post-op Pain Management:    Induction: Intravenous  PONV Risk Score and Plan:   Airway Management Planned: Natural Airway, Nasal Cannula and Mask  Additional Equipment:   Intra-op Plan:   Post-operative Plan:   Informed Consent: I have reviewed the patients History and Physical, chart, labs and discussed the procedure including the risks, benefits and alternatives for the proposed anesthesia with the patient or authorized representative who has indicated his/her understanding and acceptance.     Plan Discussed with: CRNA  Anesthesia Plan Comments:        Anesthesia Quick Evaluation

## 2017-06-13 NOTE — Interval H&P Note (Signed)
History and Physical Interval Note:  06/13/2017 11:18 AM  Carrie Key  has presented today for surgery, with the diagnosis of AFIB  The various methods of treatment have been discussed with the patient and family. After consideration of risks, benefits and other options for treatment, the patient has consented to  Procedure(s): CARDIOVERSION (N/A) as a surgical intervention .  The patient's history has been reviewed, patient examined, no change in status, stable for surgery.  I have reviewed the patient's chart and labs.  Questions were answered to the patient's satisfaction.     Jenkins Rouge

## 2017-06-13 NOTE — Progress Notes (Signed)
Sw Dr. Jillyn Hidden, no Istat needed for procedure.

## 2017-06-14 ENCOUNTER — Encounter (HOSPITAL_COMMUNITY): Payer: Self-pay | Admitting: Nurse Practitioner

## 2017-06-14 ENCOUNTER — Other Ambulatory Visit (HOSPITAL_COMMUNITY): Payer: Self-pay | Admitting: *Deleted

## 2017-06-14 ENCOUNTER — Ambulatory Visit (HOSPITAL_COMMUNITY)
Admission: RE | Admit: 2017-06-14 | Discharge: 2017-06-14 | Disposition: A | Discharge status: Home / Self Care | Payer: Medicare HMO | Source: Ambulatory Visit | Attending: Nurse Practitioner | Admitting: Nurse Practitioner

## 2017-06-14 VITALS — BP 150/72 | HR 69 | Ht 67.0 in | Wt 236.0 lb

## 2017-06-14 DIAGNOSIS — Z8249 Family history of ischemic heart disease and other diseases of the circulatory system: Secondary | ICD-10-CM | POA: Diagnosis not present

## 2017-06-14 DIAGNOSIS — E1122 Type 2 diabetes mellitus with diabetic chronic kidney disease: Secondary | ICD-10-CM | POA: Insufficient documentation

## 2017-06-14 DIAGNOSIS — Z823 Family history of stroke: Secondary | ICD-10-CM | POA: Diagnosis not present

## 2017-06-14 DIAGNOSIS — E785 Hyperlipidemia, unspecified: Secondary | ICD-10-CM | POA: Diagnosis not present

## 2017-06-14 DIAGNOSIS — I129 Hypertensive chronic kidney disease with stage 1 through stage 4 chronic kidney disease, or unspecified chronic kidney disease: Secondary | ICD-10-CM | POA: Insufficient documentation

## 2017-06-14 DIAGNOSIS — Z90710 Acquired absence of both cervix and uterus: Secondary | ICD-10-CM | POA: Insufficient documentation

## 2017-06-14 DIAGNOSIS — K219 Gastro-esophageal reflux disease without esophagitis: Secondary | ICD-10-CM | POA: Diagnosis not present

## 2017-06-14 DIAGNOSIS — I481 Persistent atrial fibrillation: Secondary | ICD-10-CM | POA: Diagnosis not present

## 2017-06-14 DIAGNOSIS — Z87891 Personal history of nicotine dependence: Secondary | ICD-10-CM | POA: Insufficient documentation

## 2017-06-14 DIAGNOSIS — Z7984 Long term (current) use of oral hypoglycemic drugs: Secondary | ICD-10-CM | POA: Diagnosis not present

## 2017-06-14 DIAGNOSIS — Z9889 Other specified postprocedural states: Secondary | ICD-10-CM | POA: Diagnosis not present

## 2017-06-14 DIAGNOSIS — Z79899 Other long term (current) drug therapy: Secondary | ICD-10-CM | POA: Insufficient documentation

## 2017-06-14 DIAGNOSIS — Z853 Personal history of malignant neoplasm of breast: Secondary | ICD-10-CM | POA: Insufficient documentation

## 2017-06-14 DIAGNOSIS — Z7901 Long term (current) use of anticoagulants: Secondary | ICD-10-CM | POA: Insufficient documentation

## 2017-06-14 DIAGNOSIS — I4819 Other persistent atrial fibrillation: Secondary | ICD-10-CM

## 2017-06-14 DIAGNOSIS — N183 Chronic kidney disease, stage 3 (moderate): Secondary | ICD-10-CM | POA: Diagnosis not present

## 2017-06-14 LAB — BASIC METABOLIC PANEL
ANION GAP: 11 (ref 5–15)
BUN: 14 mg/dL (ref 6–20)
CO2: 26 mmol/L (ref 22–32)
Calcium: 9.6 mg/dL (ref 8.9–10.3)
Chloride: 103 mmol/L (ref 101–111)
Creatinine, Ser: 1.12 mg/dL — ABNORMAL HIGH (ref 0.44–1.00)
GFR calc Af Amer: 54 mL/min — ABNORMAL LOW (ref >60)
GFR, EST NON AFRICAN AMERICAN: 47 mL/min — AB (ref >60)
GLUCOSE: 107 mg/dL — AB (ref 65–99)
POTASSIUM: 3.3 mmol/L — AB (ref 3.5–5.1)
Sodium: 140 mmol/L (ref 135–145)

## 2017-06-14 MED ORDER — POTASSIUM CHLORIDE CRYS ER 20 MEQ PO TBCR: 20.0000 meq | EXTENDED_RELEASE_TABLET | Freq: Every day | ORAL | 3 refills | Status: AC

## 2017-06-14 NOTE — Patient Instructions (Addendum)
Lasix 40 twice a day for 3 days -- on Saturday return to normal dosing of lasix once a day    Low-Sodium Eating Plan Sodium, which is an element that makes up salt, helps you maintain a healthy balance of fluids in your body. Too much sodium can increase your blood pressure and cause fluid and waste to be held in your body. Your health care provider or dietitian may recommend following this plan if you have high blood pressure (hypertension), kidney disease, liver disease, or heart failure. Eating less sodium can help lower your blood pressure, reduce swelling, and protect your heart, liver, and kidneys. What are tips for following this plan? General guidelines  Most people on this plan should limit their sodium intake to 1,500-2,000 mg (milligrams) of sodium each day. Reading food labels  The Nutrition Facts label lists the amount of sodium in one serving of the food. If you eat more than one serving, you must multiply the listed amount of sodium by the number of servings.  Choose foods with less than 140 mg of sodium per serving.  Avoid foods with 300 mg of sodium or more per serving. Shopping  Look for lower-sodium products, often labeled as "low-sodium" or "no salt added."  Always check the sodium content even if foods are labeled as "unsalted" or "no salt added".  Buy fresh foods. ? Avoid canned foods and premade or frozen meals. ? Avoid canned, cured, or processed meats  Buy breads that have less than 80 mg of sodium per slice. Cooking  Eat more home-cooked food and less restaurant, buffet, and fast food.  Avoid adding salt when cooking. Use salt-free seasonings or herbs instead of table salt or sea salt. Check with your health care provider or pharmacist before using salt substitutes.  Cook with plant-based oils, such as canola, sunflower, or olive oil. Meal planning  When eating at a restaurant, ask that your food be prepared with less salt or no salt, if  possible.  Avoid foods that contain MSG (monosodium glutamate). MSG is sometimes added to [place] food, bouillon, and some canned foods. What foods are recommended? The items listed may not be a complete list. Talk with your dietitian about what dietary choices are best for you. Grains Low-sodium cereals, including oats, puffed wheat and rice, and shredded wheat. Low-sodium crackers. Unsalted rice. Unsalted pasta. Low-sodium bread. Whole-grain breads and whole-grain pasta. Vegetables Fresh or frozen vegetables. "No salt added" canned vegetables. "No salt added" tomato sauce and paste. Low-sodium or reduced-sodium tomato and vegetable juice. Fruits Fresh, frozen, or canned fruit. Fruit juice. Meats and other protein foods Fresh or frozen (no salt added) meat, poultry, seafood, and fish. Low-sodium canned tuna and salmon. Unsalted nuts. Dried peas, beans, and lentils without added salt. Unsalted canned beans. Eggs. Unsalted nut butters. Dairy Milk. Soy milk. Cheese that is naturally low in sodium, such as ricotta cheese, fresh mozzarella, or Swiss cheese Low-sodium or reduced-sodium cheese. Cream cheese. Yogurt. Fats and oils Unsalted butter. Unsalted margarine with no trans fat. Vegetable oils such as canola or olive oils. Seasonings and other foods Fresh and dried herbs and spices. Salt-free seasonings. Low-sodium mustard and ketchup. Sodium-free salad dressing. Sodium-free light mayonnaise. Fresh or refrigerated horseradish. Lemon juice. Vinegar. Homemade, reduced-sodium, or low-sodium soups. Unsalted popcorn and pretzels. Low-salt or salt-free chips. What foods are not recommended? The items listed may not be a complete list. Talk with your dietitian about what dietary choices are best for you. Grains Instant hot cereals. Bread  stuffing, pancake, and biscuit mixes. Croutons. Seasoned rice or pasta mixes. Noodle soup cups. Boxed or frozen macaroni and cheese. Regular salted crackers.  Self-rising flour. Vegetables Sauerkraut, pickled vegetables, and relishes. Olives. [place] fries. Onion rings. Regular canned vegetables (not low-sodium or reduced-sodium). Regular canned tomato sauce and paste (not low-sodium or reduced-sodium). Regular tomato and vegetable juice (not low-sodium or reduced-sodium). Frozen vegetables in sauces. Meats and other protein foods Meat or fish that is salted, canned, smoked, spiced, or pickled. Bacon, ham, sausage, hotdogs, corned beef, chipped beef, packaged lunch meats, salt pork, jerky, pickled herring, anchovies, regular canned tuna, sardines, salted nuts. Dairy Processed cheese and cheese spreads. Cheese curds. Blue cheese. Feta cheese. String cheese. Regular cottage cheese. Buttermilk. Canned milk. Fats and oils Salted butter. Regular margarine. Ghee. Bacon fat. Seasonings and other foods Onion salt, garlic salt, seasoned salt, table salt, and sea salt. Canned and packaged gravies. Worcestershire sauce. Tartar sauce. Barbecue sauce. Teriyaki sauce. Soy sauce, including reduced-sodium. Steak sauce. Fish sauce. Oyster sauce. Cocktail sauce. Horseradish that you find on the shelf. Regular ketchup and mustard. Meat flavorings and tenderizers. Bouillon cubes. Hot sauce and Tabasco sauce. Premade or packaged marinades. Premade or packaged taco seasonings. Relishes. Regular salad dressings. Salsa. Potato and tortilla chips. Corn chips and puffs. Salted popcorn and pretzels. Canned or dried soups. Pizza. Frozen entrees and pot pies. Summary  Eating less sodium can help lower your blood pressure, reduce swelling, and protect your heart, liver, and kidneys.  Most people on this plan should limit their sodium intake to 1,500-2,000 mg (milligrams) of sodium each day.  Canned, boxed, and frozen foods are high in sodium. Restaurant foods, fast foods, and pizza are also very high in sodium. You also get sodium by adding salt to food.  Try to cook at home, eat  more fresh fruits and vegetables, and eat less fast food, canned, processed, or prepared foods. This information is not intended to replace advice given to you by your health care provider. Make sure you discuss any questions you have with your health care provider. Document Released: 07/16/2001 Document Revised: 01/18/2016 Document Reviewed: 01/18/2016 Elsevier Interactive Patient Education  [employer].

## 2017-06-14 NOTE — Progress Notes (Signed)
Primary Care Physician: Glendale Chard, MD Referring Physician: Almyra Deforest, PA  Cardiologist: Dr. Carlena Sax Cody Carrie Key is a 77 y.o. female with a h/o HTN, HLD, CAD, PAF, CKD stage III, type II DM, and history of breast cancer with simple mastectomy in 2014.  She was seen in hospital in March with afib with RVR. D/c on Cardizem 240 mg daily and Eliquis 5 mg bid. She went on to have cardioversion 4/9 but with ERAF. She was then seen back by [facility] increased lasix  and  scheduled for cardioversion yesterday, with f/u today in afib clinic. She is in rhythm today with PAC's. Despite being back in rhythm, she does not feel better. Her weight is up 2 lbs. She is not weighting daily, ate greens with ham scraps, fat back and pigs feet last night and for breakfast today ate a bowl of chicken noodle soup. Obviously is not restricting salt.  Today, she denies symptoms of palpitations , shortness of breath, orthopnea, PND, lower extremity edema, dizziness, presyncope, syncope, or neurologic sequela. The patient is tolerating medications without difficulties and is otherwise without complaint today.   Past Medical History:  Diagnosis Date  . Atrial fibrillation ([place])   . Atrial fibrillation with RVR ([place]) 05/08/2017  . Breast cancer ([place])   . Cancer ([place])   . CKD (chronic kidney disease), stage III ([place]) 07/10/2014  . Diabetes mellitus without complication ([place])   . Dyspnea   . GERD (gastroesophageal reflux disease)   . Heart murmur   . Hyperlipemia   . Hypertension   . Wears glasses    Past Surgical History:  Procedure Laterality Date  . ABDOMINAL HYSTERECTOMY    . BACK SURGERY  2002   lumbar lam  . CARDIAC CATHETERIZATION  2003  . CARDIOVERSION N/A 05/15/2017   Procedure: CARDIOVERSION;  Surgeon: Skeet Latch, MD;  Location: [place];  Service: Cardiovascular;  Laterality: N/A;  . COLONOSCOPY    . LEFT HEART CATH AND CORONARY ANGIOGRAPHY N/A 05/12/2017   Procedure: LEFT HEART  CATH AND CORONARY ANGIOGRAPHY;  Surgeon: Leonie Man, MD;  Location: [place] [facility];  Service: Cardiovascular;  Laterality: N/A;  . SIMPLE MASTECTOMY WITH AXILLARY SENTINEL NODE BIOPSY Right 12/03/2012   Procedure: RIGHT SIMPLE MASTECTOMY WITH RIGHT  AXILLARY SENTINEL LYMPH NODE BIOPSY;  Surgeon: Shann Medal, MD;  Location: [place];  Service: General;  Laterality: Right;  . TEE WITHOUT CARDIOVERSION N/A 05/15/2017   Procedure: TRANSESOPHAGEAL ECHOCARDIOGRAM (TEE);  Surgeon: Skeet Latch, MD;  Location: [place];  Service: Cardiovascular;  Laterality: N/A;  . UPPER GI ENDOSCOPY      Current Outpatient Medications  Medication Sig Dispense Refill  . albuterol (PROVENTIL HFA;VENTOLIN HFA) 108 (90 Base) MCG/ACT inhaler Inhale 1-2 puffs into the lungs every 6 (six) hours as needed for wheezing or shortness of breath.    . anastrozole (ARIMIDEX) 1 MG tablet Take 1 tablet (1 mg total) by mouth daily. 30 tablet 11  . apixaban (ELIQUIS) 5 MG TABS tablet Take 1 tablet (5 mg total) by mouth 2 (two) times daily. 60 tablet 0  . carvedilol (COREG) 25 MG tablet Take 1 tablet (25 mg total) by mouth 2 (two) times daily. 180 tablet 3  . diltiazem (CARDIZEM LA) 120 MG 24 hr tablet Take 120 mg by mouth daily.    . furosemide (LASIX) 40 MG tablet Take 40 mg by mouth daily.    Marland Kitchen gabapentin (NEURONTIN) 300 MG capsule Take 1 capsule (  300 mg total) by mouth at bedtime. 30 capsule 6  . glimepiride (AMARYL) 1 MG tablet Take 1 mg by mouth daily with breakfast.    . hydrALAZINE (APRESOLINE) 100 MG tablet Take 1 tablet (100 mg total) by mouth every 8 (eight) hours. (Patient taking differently: Take 100 mg by mouth 2 (two) times daily. ) 270 tablet 3  . isosorbide dinitrate (ISORDIL) 10 MG tablet Take 10 mg by mouth daily.    . meclizine (ANTIVERT) 12.5 MG tablet Take 1 tablet (12.5 mg total) by mouth 3 (three) times daily as needed for dizziness. 20 tablet 0  . nitroGLYCERIN (NITROSTAT)  0.4 MG SL tablet Place 1 tablet (0.4 mg total) under the tongue every 5 (five) minutes as needed for chest pain. 25 tablet 1  . omeprazole (PRILOSEC) 40 MG capsule Take 40 mg by mouth daily.     . rosuvastatin (CRESTOR) 10 MG tablet Take 1 tablet (10 mg total) by mouth daily. 90 tablet 3   No current facility-administered medications for this encounter.     No Known Allergies  Social History   Socioeconomic History  . Marital status: Widowed    Spouse name: Not on file  . Number of children: 5  . Years of education: 58  . Highest education level: Not on file  Occupational History  . Occupation: Retired    Comment: disability prior to retirement  Social Needs  . Financial resource strain: Not on file  . Food insecurity:    Worry: Not on file    Inability: Not on file  . Transportation needs:    Medical: Not on file    Non-medical: Not on file  Tobacco Use  . Smoking status: Former Smoker    Packs/day: 0.30    Years: 30.00    Pack years: 9.00    Types: Cigarettes    Last attempt to quit: 11/26/1988    Years since quitting: 28.5  . Smokeless tobacco: Never Used  Substance and Sexual Activity  . Alcohol use: Yes    Alcohol/week: 2.4 oz    Types: 4 Glasses of wine per week    Comment: occ  . Drug use: No  . Sexual activity: Yes    Birth control/protection: Post-menopausal  Lifestyle  . Physical activity:    Days per week: Not on file    Minutes per session: Not on file  . Stress: Not on file  Relationships  . Social connections:    Talks on phone: Not on file    Gets together: Not on file    Attends religious service: Not on file    Active member of club or organization: Not on file    Attends meetings of clubs or organizations: Not on file    Relationship status: Not on file  . Intimate partner violence:    Fear of current or ex partner: Not on file    Emotionally abused: Not on file    Physically abused: Not on file    Forced sexual activity: Not on file    Other Topics Concern  . Not on file  Social History Narrative   Patient is single and lives alone.   Patient is retired.   Patient has five adult children.   Patient has a 11 grade education   Patient is right-handed.   Patient drinks one cup of coffee and two cups of tea daily.    Family History  Problem Relation Age of Onset  . Hypertension Mother   .  Stroke Mother   . Hypertension Father   . Heart attack Father   . Hypertension Paternal Grandmother     ROS- All systems are reviewed and negative except as per the HPI above  Physical Exam: Vitals:   06/14/17 1338  BP: (!) 150/72  Pulse: 69  SpO2: 95%  Weight: 236 lb (107 kg)  Height: 5\' 7"  (1.702 m)   Wt Readings from Last 3 Encounters:  06/14/17 236 lb (107 kg)  06/02/17 234 lb (106.1 kg)  05/29/17 240 lb 3.2 oz (109 kg)    Labs: Lab Results  Component Value Date   NA 141 06/06/2017   K 3.5 06/06/2017   CL 100 06/06/2017   CO2 24 06/06/2017   GLUCOSE 105 (H) 06/06/2017   BUN 19 06/06/2017   CREATININE 1.75 (H) 06/06/2017   CALCIUM 9.6 06/06/2017   MG 2.5 (H) 05/09/2017   Lab Results  Component Value Date   INR 1.34 05/15/2017   Lab Results  Component Value Date   CHOL 144 04/27/2016   HDL 59 04/27/2016   LDLCALC 58 04/27/2016   TRIG 137 04/27/2016     GEN- The patient is well appearing, alert and oriented x 3 today.   Head- normocephalic, atraumatic Eyes-  Sclera clear, conjunctiva pink Ears- hearing intact Oropharynx- clear Neck- supple, no JVP Lymph- no cervical lymphadenopathy Lungs- Clear to ausculation bilaterally, normal work of breathing Heart- Regular rate and rhythm, no murmurs, rubs or gallops, PMI not laterally displaced GI- soft, NT, ND, + BS Extremities- no clubbing, cyanosis, or edema MS- no significant deformity or atrophy Skin- no rash or lesion Psych- euthymic mood, full affect Neuro- strength and sensation are intact  EKG-SR with PAC's pr int 162 bpm, qrs int 96 ms,  qtc 435 ms    Assessment and Plan: 1. Persistent  afib She had successful cardioversion yesterday and is in SR with Pac's today Despite this, she does not feel better. Her weight is up 2 lbs She is not weighting daily, will buy scales today Diet is very heavy with salt Discussed in detail how eating a diet high in salt is aggravating her fluid status Bmet today shows improvement in renal function, creatinine now 1.12, K+ low at 3.3 and will add K+ at 20 meq daily  Will  increase her lasix back to 40 mg bid x 3 days She does tell me that she is going to the beach this week end  and cautioned re eating  at beach and getting more salt in her diet Continue Carvedilol 25 mg bid Continue eliquis 5 mg bid   Will see her back next week    Butch Penny C. Nabeel Gladson, [facility] [address] [place],  [address] [phone]

## 2017-06-16 ENCOUNTER — Other Ambulatory Visit: Payer: Self-pay

## 2017-06-16 ENCOUNTER — Encounter (HOSPITAL_COMMUNITY): Payer: Self-pay | Admitting: Cardiovascular Disease

## 2017-06-16 NOTE — Patient Outreach (Signed)
[facility]) Care Management  06/16/2017  Zenobia Kuennen Oct 05, 1940 [id number]  TELEPHONE SCREENING Referral date: 06/16/17 Referral source: ADT notification Insurance: [facility]  Telephone call to patient for screening. Unable to reach patient. Contact answering phone states patient is not at home. HIPAA compliant message left.  Offered to leave contact phone number, contact disconnected call.   PLAN:  RNCM will attempt 2nd telephone call to patient within 4 business days.  RNCM will send outreach letter to attempt contact.   Quinn Plowman RN,BSN,CCM [facility] Telephonic  [phone]

## 2017-06-21 ENCOUNTER — Other Ambulatory Visit: Payer: Self-pay

## 2017-06-21 ENCOUNTER — Encounter (HOSPITAL_COMMUNITY): Payer: Self-pay | Admitting: Nurse Practitioner

## 2017-06-21 ENCOUNTER — Ambulatory Visit (HOSPITAL_COMMUNITY)
Admission: RE | Admit: 2017-06-21 | Discharge: 2017-06-21 | Disposition: A | Discharge status: Home / Self Care | Payer: Medicare HMO | Source: Ambulatory Visit | Attending: Nurse Practitioner | Admitting: Nurse Practitioner

## 2017-06-21 VITALS — BP 120/68 | HR 74 | Ht 67.0 in | Wt 229.0 lb

## 2017-06-21 DIAGNOSIS — Z8249 Family history of ischemic heart disease and other diseases of the circulatory system: Secondary | ICD-10-CM | POA: Diagnosis not present

## 2017-06-21 DIAGNOSIS — K219 Gastro-esophageal reflux disease without esophagitis: Secondary | ICD-10-CM | POA: Insufficient documentation

## 2017-06-21 DIAGNOSIS — I484 Atypical atrial flutter: Secondary | ICD-10-CM | POA: Diagnosis not present

## 2017-06-21 DIAGNOSIS — Z823 Family history of stroke: Secondary | ICD-10-CM | POA: Diagnosis not present

## 2017-06-21 DIAGNOSIS — Z90710 Acquired absence of both cervix and uterus: Secondary | ICD-10-CM | POA: Diagnosis not present

## 2017-06-21 DIAGNOSIS — Z87891 Personal history of nicotine dependence: Secondary | ICD-10-CM | POA: Insufficient documentation

## 2017-06-21 DIAGNOSIS — N183 Chronic kidney disease, stage 3 (moderate): Secondary | ICD-10-CM | POA: Diagnosis not present

## 2017-06-21 DIAGNOSIS — Z9889 Other specified postprocedural states: Secondary | ICD-10-CM | POA: Insufficient documentation

## 2017-06-21 DIAGNOSIS — R0600 Dyspnea, unspecified: Secondary | ICD-10-CM | POA: Insufficient documentation

## 2017-06-21 DIAGNOSIS — Z79899 Other long term (current) drug therapy: Secondary | ICD-10-CM | POA: Insufficient documentation

## 2017-06-21 DIAGNOSIS — Z853 Personal history of malignant neoplasm of breast: Secondary | ICD-10-CM | POA: Insufficient documentation

## 2017-06-21 DIAGNOSIS — Z7984 Long term (current) use of oral hypoglycemic drugs: Secondary | ICD-10-CM | POA: Insufficient documentation

## 2017-06-21 DIAGNOSIS — I129 Hypertensive chronic kidney disease with stage 1 through stage 4 chronic kidney disease, or unspecified chronic kidney disease: Secondary | ICD-10-CM | POA: Diagnosis not present

## 2017-06-21 DIAGNOSIS — I481 Persistent atrial fibrillation: Secondary | ICD-10-CM | POA: Diagnosis not present

## 2017-06-21 DIAGNOSIS — E1122 Type 2 diabetes mellitus with diabetic chronic kidney disease: Secondary | ICD-10-CM | POA: Diagnosis not present

## 2017-06-21 DIAGNOSIS — E785 Hyperlipidemia, unspecified: Secondary | ICD-10-CM | POA: Insufficient documentation

## 2017-06-21 DIAGNOSIS — Z7901 Long term (current) use of anticoagulants: Secondary | ICD-10-CM | POA: Insufficient documentation

## 2017-06-21 MED ORDER — APIXABAN 5 MG PO TABS: 5.0000 mg | ORAL_TABLET | Freq: Two times a day (BID) | ORAL | 6 refills | Status: AC

## 2017-06-21 NOTE — Progress Notes (Signed)
Primary Care Physician: Glendale Chard, MD Referring Physician: Almyra Deforest, PA  Cardiologist: Dr. Carlena Sax Carrie Key is a 77 y.o. female with a h/o HTN, HLD, CAD, PAF, CKD stage III, type II DM, and history of breast cancer with simple mastectomy in 2014.  She was seen in hospital in March with afib with RVR. D/c on Cardizem 240 mg daily and Eliquis 5 mg bid. She went on to have cardioversion 4/9 but with ERAF. She was then seen back by [facility] increased lasix  and  scheduled for cardioversion yesterday, with f/u today in afib clinic. She is in rhythm today with PAC's. Despite being back in rhythm, she does not feel better. Her weight is up 2 lbs. She is not weighting daily, ate greens with ham scraps, fat back and pigs feet last night and for breakfast today ate a bowl of chicken noodle soup. Obviously is not restricting salt.  F/u in afib clinic, 5/16. She feels better but now is back in aflutter, was surprised to hear, rate controlled. She has been watching her salt intake and is weighting daily and her weight is 3 pounds with less LLE.   Today, she denies symptoms of palpitations , shortness of breath, orthopnea, PND, lower extremity edema, dizziness, presyncope, syncope, or neurologic sequela. The patient is tolerating medications without difficulties and is otherwise without complaint today.   Past Medical History:  Diagnosis Date  . Atrial fibrillation ([place])   . Atrial fibrillation with RVR ([place]) 05/08/2017  . Breast cancer ([place])   . Cancer ([place])   . CKD (chronic kidney disease), stage III ([place]) 07/10/2014  . Diabetes mellitus without complication ([place])   . Dyspnea   . GERD (gastroesophageal reflux disease)   . Heart murmur   . Hyperlipemia   . Hypertension   . Wears glasses    Past Surgical History:  Procedure Laterality Date  . ABDOMINAL HYSTERECTOMY    . BACK SURGERY  2002   lumbar lam  . CARDIAC CATHETERIZATION  2003  . CARDIOVERSION N/A 05/15/2017   Procedure:  CARDIOVERSION;  Surgeon: Skeet Latch, MD;  Location: [place];  Service: Cardiovascular;  Laterality: N/A;  . CARDIOVERSION N/A 06/13/2017   Procedure: CARDIOVERSION;  Surgeon: Josue Hector, MD;  Location: [facility] ENDOSCOPY;  Service: Cardiovascular;  Laterality: N/A;  . COLONOSCOPY    . LEFT HEART CATH AND CORONARY ANGIOGRAPHY N/A 05/12/2017   Procedure: LEFT HEART CATH AND CORONARY ANGIOGRAPHY;  Surgeon: Leonie Man, MD;  Location: [place] CV LAB;  Service: Cardiovascular;  Laterality: N/A;  . SIMPLE MASTECTOMY WITH AXILLARY SENTINEL NODE BIOPSY Right 12/03/2012   Procedure: RIGHT SIMPLE MASTECTOMY WITH RIGHT  AXILLARY SENTINEL LYMPH NODE BIOPSY;  Surgeon: Shann Medal, MD;  Location: [place];  Service: General;  Laterality: Right;  . TEE WITHOUT CARDIOVERSION N/A 05/15/2017   Procedure: TRANSESOPHAGEAL ECHOCARDIOGRAM (TEE);  Surgeon: Skeet Latch, MD;  Location: [place];  Service: Cardiovascular;  Laterality: N/A;  . UPPER GI ENDOSCOPY      Current Outpatient Medications  Medication Sig Dispense Refill  . anastrozole (ARIMIDEX) 1 MG tablet Take 1 tablet (1 mg total) by mouth daily. 30 tablet 11  . apixaban (ELIQUIS) 5 MG TABS tablet Take 1 tablet (5 mg total) by mouth 2 (two) times daily. 60 tablet 6  . carvedilol (COREG) 25 MG tablet Take 1 tablet (25 mg total) by mouth 2 (two) times daily. 180 tablet 3  . diltiazem (CARDIZEM LA) 120 MG  24 hr tablet Take 120 mg by mouth daily.    . furosemide (LASIX) 40 MG tablet Take 40 mg by mouth 2 (two) times daily.     Marland Kitchen gabapentin (NEURONTIN) 300 MG capsule Take 1 capsule (300 mg total) by mouth at bedtime. 30 capsule 6  . glimepiride (AMARYL) 1 MG tablet Take 1 mg by mouth daily with breakfast.    . hydrALAZINE (APRESOLINE) 100 MG tablet Take 1 tablet (100 mg total) by mouth every 8 (eight) hours. (Patient taking differently: Take 100 mg by mouth 2 (two) times daily. ) 270 tablet 3  . isosorbide dinitrate  (ISORDIL) 10 MG tablet Take 10 mg by mouth daily.    . meclizine (ANTIVERT) 12.5 MG tablet Take 1 tablet (12.5 mg total) by mouth 3 (three) times daily as needed for dizziness. 20 tablet 0  . nitroGLYCERIN (NITROSTAT) 0.4 MG SL tablet Place 1 tablet (0.4 mg total) under the tongue every 5 (five) minutes as needed for chest pain. 25 tablet 1  . omeprazole (PRILOSEC) 40 MG capsule Take 40 mg by mouth daily.     . potassium chloride SA (K-DUR,KLOR-CON) 20 MEQ tablet Take 1 tablet (20 mEq total) by mouth daily. 30 tablet 3  . rosuvastatin (CRESTOR) 10 MG tablet Take 1 tablet (10 mg total) by mouth daily. 90 tablet 3  . albuterol (PROVENTIL HFA;VENTOLIN HFA) 108 (90 Base) MCG/ACT inhaler Inhale 1-2 puffs into the lungs every 6 (six) hours as needed for wheezing or shortness of breath.     No current facility-administered medications for this encounter.     No Known Allergies  Social History   Socioeconomic History  . Marital status: Widowed    Spouse name: Not on file  . Number of children: 5  . Years of education: 41  . Highest education level: Not on file  Occupational History  . Occupation: Retired    Comment: disability prior to retirement  Social Needs  . Financial resource strain: Not on file  . Food insecurity:    Worry: Not on file    Inability: Not on file  . Transportation needs:    Medical: Not on file    Non-medical: Not on file  Tobacco Use  . Smoking status: Former Smoker    Packs/day: 0.30    Years: 30.00    Pack years: 9.00    Types: Cigarettes    Last attempt to quit: 11/26/1988    Years since quitting: 28.5  . Smokeless tobacco: Never Used  Substance and Sexual Activity  . Alcohol use: Yes    Alcohol/week: 2.4 oz    Types: 4 Glasses of wine per week    Comment: occ  . Drug use: No  . Sexual activity: Yes    Birth control/protection: Post-menopausal  Lifestyle  . Physical activity:    Days per week: Not on file    Minutes per session: Not on file  .  Stress: Not on file  Relationships  . Social connections:    Talks on phone: Not on file    Gets together: Not on file    Attends religious service: Not on file    Active member of club or organization: Not on file    Attends meetings of clubs or organizations: Not on file    Relationship status: Not on file  . Intimate partner violence:    Fear of current or ex partner: Not on file    Emotionally abused: Not on file  Physically abused: Not on file    Forced sexual activity: Not on file  Other Topics Concern  . Not on file  Social History Narrative   Patient is single and lives alone.   Patient is retired.   Patient has five adult children.   Patient has a 11 grade education   Patient is right-handed.   Patient drinks one cup of coffee and two cups of tea daily.    Family History  Problem Relation Age of Onset  . Hypertension Mother   . Stroke Mother   . Hypertension Father   . Heart attack Father   . Hypertension Paternal Grandmother     ROS- All systems are reviewed and negative except as per the HPI above  Physical Exam: Vitals:   06/21/17 1358  BP: 120/68  Pulse: 74  Weight: 229 lb (103.9 kg)  Height: 5\' 7"  (1.702 m)   Wt Readings from Last 3 Encounters:  06/21/17 229 lb (103.9 kg)  06/14/17 236 lb (107 kg)  06/02/17 234 lb (106.1 kg)    Labs: Lab Results  Component Value Date   NA 140 06/14/2017   K 3.3 (L) 06/14/2017   CL 103 06/14/2017   CO2 26 06/14/2017   GLUCOSE 107 (H) 06/14/2017   BUN 14 06/14/2017   CREATININE 1.12 (H) 06/14/2017   CALCIUM 9.6 06/14/2017   MG 2.5 (H) 05/09/2017   Lab Results  Component Value Date   INR 1.34 05/15/2017   Lab Results  Component Value Date   CHOL 144 04/27/2016   HDL 59 04/27/2016   LDLCALC 58 04/27/2016   TRIG 137 04/27/2016     GEN- The patient is well appearing, alert and oriented x 3 today.   Head- normocephalic, atraumatic Eyes-  Sclera clear, conjunctiva pink Ears- hearing  intact Oropharynx- clear Neck- supple, no JVP Lymph- no cervical lymphadenopathy Lungs- Clear to ausculation bilaterally, normal work of breathing Heart- irregular rate and rhythm, no murmurs, rubs or gallops, PMI not laterally displaced GI- soft, NT, ND, + BS Extremities- no clubbing, cyanosis, or edema MS- no significant deformity or atrophy Skin- no rash or lesion Psych- euthymic mood, full affect Neuro- strength and sensation are intact  EKG-atrial flutter with variable AV block, qrs int 96 ms, qtc 455 ms  Assessment and Plan: 1. Persistent  afib She had successful cardioversion  nd was is in SR with Pac's when I saw pt one week ago Now with ERAF We discussed pursing SR, I think her best option would be amiodarone, I don't see any h/o lung disease, thyroid disease, liver disease. I see normal liver panel in January  and normal tsh in April I will get Dr. Jacalyn Lefevre input and if ok will start amio load at 200 mg bid will anticipated cardioversion in one month Will need baseline PFT's Her weight is now down 3 lbs, trying to avoid salt Continue to weigh daily Continue  lasix at 40 mg daily Continue Carvedilol 25 mg bid Continue eliquis 5 mg bid   Will phone pt with plan   Butch Penny C. Braxon Suder, [facility] [address] [place], [place] [address] [phone]

## 2017-06-21 NOTE — Patient Outreach (Signed)
[place] [facility]) Care Management  06/21/2017  Carrie Key 04/19/1940 [id number]  TELEPHONE SCREENING Referral date: 06/16/17 Referral source: ADT notification Insurance: [facility]  Telephone call to patient regarding recent outpatient notification.  HIPAA verified with patient. Discussed reason for call. Patient confirmed she was discharged from the hospital in April 2019 with atrial fibrillation. Patient reports this is a new diagnosis for her. Patient states she saw the cardiologist after being discharged from the hospital. Denies having a follow up appointment. Patient states, " they said they would call me to set up another appointment."   Patient states she has diabetes. Reports taking oral medication only. She states her doctor wants her to check her blood sugars 1 time per day. Patient states she has been out of strips to check her blood pressure for approximately 1 month. Patient unsure of what her last A1c was. Patient states her blood sugars when she was checking them were ranging between the 90's - 120.   RNCM offered [facility] care management services to patient for disease management and education. Patient verbally agreed.  RNCM gave patient name and contact information for [facility] care management and 24 hour nurse advise line.   PLAN: RNCM will refer patient to heath coach  Quinn Plowman RN,BSN,CCM [facility] Telephonic  [phone]

## 2017-06-22 DIAGNOSIS — I10 Essential (primary) hypertension: Secondary | ICD-10-CM | POA: Diagnosis not present

## 2017-06-22 DIAGNOSIS — I4891 Unspecified atrial fibrillation: Secondary | ICD-10-CM | POA: Diagnosis not present

## 2017-06-22 DIAGNOSIS — R7309 Other abnormal glucose: Secondary | ICD-10-CM | POA: Diagnosis not present

## 2017-06-26 ENCOUNTER — Encounter: Payer: Self-pay | Admitting: *Deleted

## 2017-07-06 DIAGNOSIS — I4891 Unspecified atrial fibrillation: Secondary | ICD-10-CM | POA: Diagnosis not present

## 2017-07-07 LAB — BASIC METABOLIC PANEL
BUN / CREAT RATIO: 13 (ref 12–28)
BUN: 16 mg/dL (ref 8–27)
CO2: 23 mmol/L (ref 20–29)
Calcium: 9.6 mg/dL (ref 8.7–10.3)
Chloride: 103 mmol/L (ref 96–106)
Creatinine, Ser: 1.27 mg/dL — ABNORMAL HIGH (ref 0.57–1.00)
GFR, EST AFRICAN AMERICAN: 48 mL/min/{1.73_m2} — AB (ref >59)
GFR, EST NON AFRICAN AMERICAN: 41 mL/min/{1.73_m2} — AB (ref >59)
Glucose: 86 mg/dL (ref 65–99)
Potassium: 4.3 mmol/L (ref 3.5–5.2)
Sodium: 142 mmol/L (ref 134–144)

## 2017-07-07 NOTE — Progress Notes (Signed)
Renal function stable, potassium improved

## 2017-07-10 ENCOUNTER — Other Ambulatory Visit: Payer: Self-pay | Admitting: *Deleted

## 2017-07-10 NOTE — Patient Outreach (Signed)
[place] [facility]) Care Management  07/10/2017  Carrie Key 27-Mar-1940 [id number]   RN Health Coach Introduction Call  Referral Date:  06/21/2017 Referral Source:  Telephonic Screening Reason for Referral:  Disease Management Education Insurance:  [employer]   Outreach Attempt:  Successful telephone outreach to patient for introduction call.  HIPAA verified with patient.  RN Health Coach introduced self and role.  Patient verbally agrees to monthly telephone outreaches.  Plan:  RN Health Coach will attempt outreach to complete initial telephone assessment in the next 10 business days.   [place] Coach [phone] [email or web address]

## 2017-07-11 ENCOUNTER — Ambulatory Visit (HOSPITAL_COMMUNITY)
Admission: RE | Admit: 2017-07-11 | Discharge: 2017-07-11 | Disposition: A | Discharge status: Home / Self Care | Payer: Medicare HMO | Source: Ambulatory Visit | Attending: Nurse Practitioner | Admitting: Nurse Practitioner

## 2017-07-11 ENCOUNTER — Encounter (HOSPITAL_COMMUNITY): Payer: Self-pay | Admitting: Nurse Practitioner

## 2017-07-11 VITALS — BP 114/74 | HR 82 | Ht 67.0 in | Wt 232.6 lb

## 2017-07-11 DIAGNOSIS — Z87891 Personal history of nicotine dependence: Secondary | ICD-10-CM | POA: Diagnosis not present

## 2017-07-11 DIAGNOSIS — E1122 Type 2 diabetes mellitus with diabetic chronic kidney disease: Secondary | ICD-10-CM | POA: Diagnosis not present

## 2017-07-11 DIAGNOSIS — Z8249 Family history of ischemic heart disease and other diseases of the circulatory system: Secondary | ICD-10-CM | POA: Diagnosis not present

## 2017-07-11 DIAGNOSIS — Z7984 Long term (current) use of oral hypoglycemic drugs: Secondary | ICD-10-CM | POA: Diagnosis not present

## 2017-07-11 DIAGNOSIS — Z9011 Acquired absence of right breast and nipple: Secondary | ICD-10-CM | POA: Insufficient documentation

## 2017-07-11 DIAGNOSIS — Z79899 Other long term (current) drug therapy: Secondary | ICD-10-CM | POA: Diagnosis not present

## 2017-07-11 DIAGNOSIS — I2510 Atherosclerotic heart disease of native coronary artery without angina pectoris: Secondary | ICD-10-CM | POA: Insufficient documentation

## 2017-07-11 DIAGNOSIS — I129 Hypertensive chronic kidney disease with stage 1 through stage 4 chronic kidney disease, or unspecified chronic kidney disease: Secondary | ICD-10-CM | POA: Insufficient documentation

## 2017-07-11 DIAGNOSIS — N183 Chronic kidney disease, stage 3 (moderate): Secondary | ICD-10-CM | POA: Diagnosis not present

## 2017-07-11 DIAGNOSIS — Z90710 Acquired absence of both cervix and uterus: Secondary | ICD-10-CM | POA: Insufficient documentation

## 2017-07-11 DIAGNOSIS — C50919 Malignant neoplasm of unspecified site of unspecified female breast: Secondary | ICD-10-CM | POA: Insufficient documentation

## 2017-07-11 DIAGNOSIS — Z79811 Long term (current) use of aromatase inhibitors: Secondary | ICD-10-CM | POA: Diagnosis not present

## 2017-07-11 DIAGNOSIS — Z7901 Long term (current) use of anticoagulants: Secondary | ICD-10-CM | POA: Diagnosis not present

## 2017-07-11 DIAGNOSIS — K219 Gastro-esophageal reflux disease without esophagitis: Secondary | ICD-10-CM | POA: Diagnosis not present

## 2017-07-11 DIAGNOSIS — Z823 Family history of stroke: Secondary | ICD-10-CM | POA: Insufficient documentation

## 2017-07-11 DIAGNOSIS — I4819 Other persistent atrial fibrillation: Secondary | ICD-10-CM

## 2017-07-11 DIAGNOSIS — I481 Persistent atrial fibrillation: Secondary | ICD-10-CM | POA: Diagnosis not present

## 2017-07-11 DIAGNOSIS — E785 Hyperlipidemia, unspecified: Secondary | ICD-10-CM | POA: Insufficient documentation

## 2017-07-11 MED ORDER — AMIODARONE HCL 200 MG PO TABS: ORAL_TABLET | ORAL | 0 refills | Status: DC

## 2017-07-11 NOTE — Progress Notes (Signed)
Primary Care Physician: Glendale Chard, MD Referring Physician: Almyra Deforest, PA  Cardiologist: Dr. Carlena Sax Carrie Key is a 77 y.o. female with a h/o HTN, HLD, CAD, PAF, CKD stage III, type II DM, and history of breast cancer with simple mastectomy in 2014.  She was seen in hospital in March with afib with RVR. D/c on Cardizem 240 mg daily and Eliquis 5 mg bid. She went on to have cardioversion 4/9 but with ERAF. She was then seen back by [facility] increased lasix  and  scheduled for cardioversion yesterday, with f/u today in afib clinic. She is in rhythm today with PAC's. Despite being back in rhythm, she does not feel better. Her weight is up 2 lbs. She is not weighting daily, ate greens with ham scraps, fat back and pigs feet last night and for breakfast today ate a bowl of chicken noodle soup. Obviously is not restricting salt.  F/u in afib clinic, 5/16. She feels better but now is back in aflutter, was surprised to hear, rate controlled. She has been watching her salt intake and is weighting daily and her weight is 3 pounds with less LLE.   Seeing pt back in afib clinic, 07/11/17 for start of amiodarone. She does have increased shortness of breath with afib. She is rate controlled.Weight is up 3 lbs but has been trying to avoid salty foods. Amiodarone discussed benefit vrs risk of med, she is agreeable to start.  Today, she denies symptoms of palpitations , shortness of breath, orthopnea, PND, lower extremity edema, dizziness, presyncope, syncope, or neurologic sequela. The patient is tolerating medications without difficulties and is otherwise without complaint today.   Past Medical History:  Diagnosis Date  . Atrial fibrillation ([place])   . Atrial fibrillation with RVR ([place]) 05/08/2017  . Breast cancer ([place])   . Cancer ([place])   . CKD (chronic kidney disease), stage III ([place]) 07/10/2014  . Diabetes mellitus without complication ([place])   . Dyspnea   . GERD (gastroesophageal reflux  disease)   . Heart murmur   . Hyperlipemia   . Hypertension   . Wears glasses    Past Surgical History:  Procedure Laterality Date  . ABDOMINAL HYSTERECTOMY    . BACK SURGERY  2002   lumbar lam  . CARDIAC CATHETERIZATION  2003  . CARDIOVERSION N/A 05/15/2017   Procedure: CARDIOVERSION;  Surgeon: Skeet Latch, MD;  Location: [place];  Service: Cardiovascular;  Laterality: N/A;  . CARDIOVERSION N/A 06/13/2017   Procedure: CARDIOVERSION;  Surgeon: Josue Hector, MD;  Location: [facility] ENDOSCOPY;  Service: Cardiovascular;  Laterality: N/A;  . COLONOSCOPY    . LEFT HEART CATH AND CORONARY ANGIOGRAPHY N/A 05/12/2017   Procedure: LEFT HEART CATH AND CORONARY ANGIOGRAPHY;  Surgeon: Leonie Man, MD;  Location: [place] CV LAB;  Service: Cardiovascular;  Laterality: N/A;  . SIMPLE MASTECTOMY WITH AXILLARY SENTINEL NODE BIOPSY Right 12/03/2012   Procedure: RIGHT SIMPLE MASTECTOMY WITH RIGHT  AXILLARY SENTINEL LYMPH NODE BIOPSY;  Surgeon: Shann Medal, MD;  Location: [place];  Service: General;  Laterality: Right;  . TEE WITHOUT CARDIOVERSION N/A 05/15/2017   Procedure: TRANSESOPHAGEAL ECHOCARDIOGRAM (TEE);  Surgeon: Skeet Latch, MD;  Location: [place];  Service: Cardiovascular;  Laterality: N/A;  . UPPER GI ENDOSCOPY      Current Outpatient Medications  Medication Sig Dispense Refill  . albuterol (PROVENTIL HFA;VENTOLIN HFA) 108 (90 Base) MCG/ACT inhaler Inhale 1-2 puffs into the lungs every 6 (six) hours as  needed for wheezing or shortness of breath.    . anastrozole (ARIMIDEX) 1 MG tablet Take 1 tablet (1 mg total) by mouth daily. 30 tablet 11  . apixaban (ELIQUIS) 5 MG TABS tablet Take 1 tablet (5 mg total) by mouth 2 (two) times daily. 60 tablet 6  . carvedilol (COREG) 25 MG tablet Take 1 tablet (25 mg total) by mouth 2 (two) times daily. 180 tablet 3  . furosemide (LASIX) 40 MG tablet Take 40 mg by mouth daily.     Marland Kitchen gabapentin (NEURONTIN) 300 MG  capsule Take 1 capsule (300 mg total) by mouth at bedtime. 30 capsule 6  . glimepiride (AMARYL) 1 MG tablet Take 1 mg by mouth daily with breakfast.    . hydrALAZINE (APRESOLINE) 100 MG tablet Take 1 tablet (100 mg total) by mouth every 8 (eight) hours. (Patient taking differently: Take 100 mg by mouth 2 (two) times daily. ) 270 tablet 3  . isosorbide dinitrate (ISORDIL) 10 MG tablet Take 10 mg by mouth daily.    . meclizine (ANTIVERT) 12.5 MG tablet Take 1 tablet (12.5 mg total) by mouth 3 (three) times daily as needed for dizziness. 20 tablet 0  . nitroGLYCERIN (NITROSTAT) 0.4 MG SL tablet Place 1 tablet (0.4 mg total) under the tongue every 5 (five) minutes as needed for chest pain. 25 tablet 1  . omeprazole (PRILOSEC) 40 MG capsule Take 40 mg by mouth daily.     . potassium chloride SA (K-DUR,KLOR-CON) 20 MEQ tablet Take 1 tablet (20 mEq total) by mouth daily. 30 tablet 3  . rosuvastatin (CRESTOR) 10 MG tablet Take 1 tablet (10 mg total) by mouth daily. 90 tablet 3  . amiodarone (PACERONE) 200 MG tablet Take 1 tablet twice a day for 1 month then reduce to once a day 60 tablet 0   No current facility-administered medications for this encounter.     No Known Allergies  Social History   Socioeconomic History  . Marital status: Widowed    Spouse name: Not on file  . Number of children: 5  . Years of education: 58  . Highest education level: Not on file  Occupational History  . Occupation: Retired    Comment: disability prior to retirement  Social Needs  . Financial resource strain: Not on file  . Food insecurity:    Worry: Not on file    Inability: Not on file  . Transportation needs:    Medical: Not on file    Non-medical: Not on file  Tobacco Use  . Smoking status: Former Smoker    Packs/day: 0.30    Years: 30.00    Pack years: 9.00    Types: Cigarettes    Last attempt to quit: 11/26/1988    Years since quitting: 28.6  . Smokeless tobacco: Never Used  Substance and  Sexual Activity  . Alcohol use: Yes    Alcohol/week: 2.4 oz    Types: 4 Glasses of wine per week    Comment: occ  . Drug use: No  . Sexual activity: Yes    Birth control/protection: Post-menopausal  Lifestyle  . Physical activity:    Days per week: Not on file    Minutes per session: Not on file  . Stress: Not on file  Relationships  . Social connections:    Talks on phone: Not on file    Gets together: Not on file    Attends religious service: Not on file    Active member of club  or organization: Not on file    Attends meetings of clubs or organizations: Not on file    Relationship status: Not on file  . Intimate partner violence:    Fear of current or ex partner: Not on file    Emotionally abused: Not on file    Physically abused: Not on file    Forced sexual activity: Not on file  Other Topics Concern  . Not on file  Social History Narrative   Patient is single and lives alone.   Patient is retired.   Patient has five adult children.   Patient has a 11 grade education   Patient is right-handed.   Patient drinks one cup of coffee and two cups of tea daily.    Family History  Problem Relation Age of Onset  . Hypertension Mother   . Stroke Mother   . Hypertension Father   . Heart attack Father   . Hypertension Paternal Grandmother     ROS- All systems are reviewed and negative except as per the HPI above  Physical Exam: Vitals:   07/11/17 1332  BP: 114/74  Pulse: 82  Weight: 232 lb 9.6 oz (105.5 kg)  Height: 5\' 7"  (1.702 m)   Wt Readings from Last 3 Encounters:  07/11/17 232 lb 9.6 oz (105.5 kg)  06/21/17 229 lb (103.9 kg)  06/14/17 236 lb (107 kg)    Labs: Lab Results  Component Value Date   NA 142 07/06/2017   K 4.3 07/06/2017   CL 103 07/06/2017   CO2 23 07/06/2017   GLUCOSE 86 07/06/2017   BUN 16 07/06/2017   CREATININE 1.27 (H) 07/06/2017   CALCIUM 9.6 07/06/2017   MG 2.5 (H) 05/09/2017   Lab Results  Component Value Date   INR 1.34  05/15/2017   Lab Results  Component Value Date   CHOL 144 04/27/2016   HDL 59 04/27/2016   LDLCALC 58 04/27/2016   TRIG 137 04/27/2016     GEN- The patient is well appearing, alert and oriented x 3 today.   Head- normocephalic, atraumatic Eyes-  Sclera clear, conjunctiva pink Ears- hearing intact Oropharynx- clear Neck- supple, no JVP Lymph- no cervical lymphadenopathy Lungs- Clear to ausculation bilaterally, normal work of breathing Heart- irregular rate and rhythm, no murmurs, rubs or gallops, PMI not laterally displaced GI- soft, NT, ND, + BS Extremities- no clubbing, cyanosis, or edema MS- no significant deformity or atrophy Skin- no rash or lesion Psych- euthymic mood, full affect Neuro- strength and sensation are intact  EKG-atrial  Fib qrs int 94 ms, 462 ms   Assessment and Plan: 1. Persistent  afib She had successful cardioversion  and was is in SR with Pac's when I first saw pt Now with ERAF We discussed pursing SR, I think her best option would be amiodarone, I don't see any h/o lung disease, thyroid disease, liver disease. I see normal liver panel in January  and normal tsh in April  Will start amiodarone 200 mg bid x one month with plans for cardioversion at that time Will stop cardizem Will need baseline PFT's, to be scheduled Her weight is up   3 lbs, trying to avoid salt Continue to weigh daily Continue  lasix at 40 mg daily Continue Carvedilol 25 mg bid Continue eliquis 5 mg bid, reminded not to miss doses with possible cardioversion in a few weeks  Will see pt back in one week for EKG on amio   Keilana Morlock C. Clemente Dewey, [facility]  [facility] [address] [place], [place] [address] [phone]

## 2017-07-11 NOTE — Patient Instructions (Addendum)
Start Amiodarone 200mg  twice a day for 1 month then we will reduce to once a day -- take with food  Stop Cardizem (diltiazem)  No smoking, inhalers or caffeine 4 hours prior to lung testing.

## 2017-07-17 ENCOUNTER — Encounter: Payer: Self-pay | Admitting: *Deleted

## 2017-07-17 ENCOUNTER — Other Ambulatory Visit: Payer: Self-pay | Admitting: *Deleted

## 2017-07-17 NOTE — Patient Outreach (Signed)
[facility]) Care Management  [place]  07/17/2017   Carrie Key [birth year] [id number]   RN Health Coach Initial Assessment  Referral Date:  06/21/2017 Referral Source:  Telephonic Screening Reason for Referral:  Disease Management Education Insurance:  [employer] Med[employer]   Outreach Attempt:  Successful telephone outreach to patient for initial telephone assessment.  HIPAA verified with patient.  Patient completed initial telephone assessment.  Social:  Patient lives at home alone.  Verbalizes she is independent with ADLs and IADLs.  Ambulates independently and denies any falls in the past year.  States her friend transports her to her medical appointments.  DME in the home include:  Straight cane, back brace, CBG meter, eyeglasses and scale.  Conditions:   Per chart review and discussion with patient, PMH include but not limited to:  New diagnosis of atrial fibrillation/atrial flutter, breast cancer with right mastectomy, chronic kidney disease, diabetes, GERD, heart murmur, hyperlipidemia, hypertension.  Reports her last admission in March was related to new onset of atrial fibrillation where she was cardioverted.  Now back in atrial fibrillation/flutter and is attending the [facility].  Was recently initiated on Amiodarone for possible repeat Cardioversion in the near future.  Reports shortness of breath with activity.  No lower extremity edema since starting on lasix.  Weights about 3 times a week.  Patient encouraged to weigh daily as recommended by the Afib Clinic.  Discussed benefits of weighing daily.  Patient stating she was told she was prediabetic.  Last Hgb A1C was 5.8 on 03/23/17.  Monitors her blood sugars about 3 times a week and reports her usual fasting blood sugars range in the 90's.  Reports she is no longer taking glimepiride for diabetes.  Medications:  Patient reports taking about 8 medications.  Manages her medications herself without  difficulties and states she takes medications directly from pill bottles.  Patient stating a lot of her previous medications were changed on discharge from the [facility].  Reports she is no longer taking her statin, glimepiride, nor her Arimidex.  Patient encouraged to take all her medications with her to her next Af[facility] Clinic appointment to discuss medications, especially her statin.  Also encouraged to discuss her medications with her primary care provider.  Encounter Medications:  Outpatient Encounter Medications as of 07/17/2017  Medication Sig Note  . amiodarone (PACERONE) 200 MG tablet Take 1 tablet twice a day for 1 month then reduce to once a day   . apixaban (ELIQUIS) 5 MG TABS tablet Take 1 tablet (5 mg total) by mouth 2 (two) times daily.   . carvedilol (COREG) 25 MG tablet Take 1 tablet (25 mg total) by mouth 2 (two) times daily.   . furosemide (LASIX) 40 MG tablet Take 40 mg by mouth daily.    Marland Kitchen gabapentin (NEURONTIN) 300 MG capsule Take 1 capsule (300 mg total) by mouth at bedtime.   . hydrALAZINE (APRESOLINE) 100 MG tablet Take 1 tablet (100 mg total) by mouth every 8 (eight) hours. (Patient taking differently: Take 100 mg by mouth 2 (two) times daily. )   . isosorbide dinitrate (ISORDIL) 10 MG tablet Take 10 mg by mouth daily.   . nitroGLYCERIN (NITROSTAT) 0.4 MG SL tablet Place 1 tablet (0.4 mg total) under the tongue every 5 (five) minutes as needed for chest pain.   Marland Kitchen omeprazole (PRILOSEC) 40 MG capsule Take 40 mg by mouth daily.    . potassium chloride SA (K-DUR,KLOR-CON) 20 MEQ tablet Take 1 tablet (  20 mEq total) by mouth daily.   Marland Kitchen albuterol (PROVENTIL HFA;VENTOLIN HFA) 108 (90 Base) MCG/ACT inhaler Inhale 1-2 puffs into the lungs every 6 (six) hours as needed for wheezing or shortness of breath. 07/17/2017: Patient states physician told her not to use  . anastrozole (ARIMIDEX) 1 MG tablet Take 1 tablet (1 mg total) by mouth daily. (Patient not taking: Reported on 07/17/2017)  07/17/2017: Patient reports she is no longer taking  . glimepiride (AMARYL) 1 MG tablet Take 1 mg by mouth daily with breakfast. 07/17/2017: Patient reports not taking  . meclizine (ANTIVERT) 12.5 MG tablet Take 1 tablet (12.5 mg total) by mouth 3 (three) times daily as needed for dizziness. (Patient not taking: Reported on 07/17/2017) 07/17/2017: Patient reports not taking  . rosuvastatin (CRESTOR) 10 MG tablet Take 1 tablet (10 mg total) by mouth daily. (Patient not taking: Reported on 07/17/2017) 07/17/2017: Patient reports not taking   No facility-administered encounter medications on file as of 07/17/2017.     Functional Status:  In your present state of health, do you have any difficulty performing the following activities: 07/17/2017 05/08/2017  Hearing? N -  Vision? N -  Difficulty concentrating or making decisions? N -  Walking or climbing stairs? Y -  Comment trouble going up stairs -  Dressing or bathing? N -  Doing errands, shopping? N Y  Conservation officer, nature and eating ? N -  Using the Toilet? N -  In the past six months, have you accidently leaked urine? Y -  Do you have problems with loss of bowel control? N -  Managing your Medications? N -  Managing your Finances? N -  Housekeeping or managing your Housekeeping? N -  Some recent data might be hidden    Fall/Depression Screening: Fall Risk  07/17/2017  Falls in the past year? No   PHQ 2/9 Scores 07/17/2017 06/21/2017  PHQ - 2 Score 0 1    THN CM Care Plan Problem One     Most Recent Value  Care Plan Problem One  Knowledge deficiet related to atrial fibrillation.  Role Documenting the Problem One  [place] for Problem One  Active  [facility] Long Term Goal   Patient will lose 3 pounds in the next 90 days.  THN Long Term Goal Start Date  07/17/17  Interventions for Problem One Long Term Goal  Reviewed and discussed current care plan and goals with patient, encouraged patient to keep and attend medical appointments,  reviewed medications and encouraged medication compliance, instructed patient to take all medications with her to her next Afib Clinic appointment to verify her medications especially her statin, encouraged patient to increase activity as medical condition allows, reviewed low sodium diet with patient  THN CM Short Term Goal #1   Patient will weigh herself daily within the next 30 days.  THN CM Short Term Goal #1 Start Date  07/17/17  Interventions for Short Term Goal #1  Reviewed signs and symptoms of fluid overload with patient, discussed importance of weighing herself daily and when to call the physician, discussed fluid medication and encouraged medication compliance, sending 2019 Calendar booklet to assist with weight monitoring documentation, encouraged patient to weigh herself daily  THN CM Short Term Goal #2   Patient will review Afib educational booklet in the next 30 days.  THN CM Short Term Goal #2 Start Date  07/17/17  Interventions for Short Term Goal #2  Sending Atrial Fibrillation Educational Booklet to patient, encouraged  patient to review afib booklet, encouraged patient to discuss afib education with nurses at [facility], reviewed disease process and medications for atrial fibrillation, discussed anticoagulation with patient, stroke prevention and FAST reviewed and discussed with patient     Appointments:  Patient's health care providers include:  Dr. Baird Cancer (primary care), Dr. Stanford Breed (Cardiologist), and Roderic Palau FNP [facility]).  Attended primary medical appointment on 07/06/2017 and has scheduled follow up appointment in August 2019 (not sure of the exact date).  Patient has appointment with the Afib Clinic on this Thursday, June 13.  Advanced Directives:  Denies having an Advanced Directive in place.  Does request information to create one.   Consent:  [facility] services reviewed and discussed with patient.  Patient verbally agrees to monthly telephone outreaches.  Plan: RN  Health Coach will send primary MD barriers letter. RN Health Coach will route initial telephone assessment note to primary MD. [place] will send patient Welcome Packet. RN Health Coach will send patient 2019 Calendar Booklet. RN Health Coach will send patient Ecologist. RN Health Coach will send patient Afib Educational Packet. RN Health Coach will make next monthly outreach to patient in the month of July.  [place] [phone] [email or web address]

## 2017-07-20 ENCOUNTER — Ambulatory Visit (HOSPITAL_BASED_OUTPATIENT_CLINIC_OR_DEPARTMENT_OTHER)
Admission: RE | Admit: 2017-07-20 | Discharge: 2017-07-20 | Disposition: A | Discharge status: Home / Self Care | Payer: Medicare HMO | Source: Ambulatory Visit | Attending: Nurse Practitioner | Admitting: Nurse Practitioner

## 2017-07-20 ENCOUNTER — Ambulatory Visit (HOSPITAL_COMMUNITY)
Admission: RE | Admit: 2017-07-20 | Discharge: 2017-07-20 | Disposition: A | Discharge status: Home / Self Care | Payer: Medicare HMO | Source: Ambulatory Visit | Attending: Nurse Practitioner | Admitting: Nurse Practitioner

## 2017-07-20 ENCOUNTER — Encounter (HOSPITAL_COMMUNITY): Payer: Self-pay | Admitting: Nurse Practitioner

## 2017-07-20 ENCOUNTER — Other Ambulatory Visit: Payer: Self-pay

## 2017-07-20 VITALS — BP 158/96 | HR 91 | Ht 67.0 in | Wt 232.0 lb

## 2017-07-20 DIAGNOSIS — Z853 Personal history of malignant neoplasm of breast: Secondary | ICD-10-CM | POA: Insufficient documentation

## 2017-07-20 DIAGNOSIS — K219 Gastro-esophageal reflux disease without esophagitis: Secondary | ICD-10-CM | POA: Insufficient documentation

## 2017-07-20 DIAGNOSIS — I4892 Unspecified atrial flutter: Secondary | ICD-10-CM | POA: Insufficient documentation

## 2017-07-20 DIAGNOSIS — I129 Hypertensive chronic kidney disease with stage 1 through stage 4 chronic kidney disease, or unspecified chronic kidney disease: Secondary | ICD-10-CM | POA: Diagnosis not present

## 2017-07-20 DIAGNOSIS — I4819 Other persistent atrial fibrillation: Secondary | ICD-10-CM

## 2017-07-20 DIAGNOSIS — E785 Hyperlipidemia, unspecified: Secondary | ICD-10-CM | POA: Insufficient documentation

## 2017-07-20 DIAGNOSIS — N183 Chronic kidney disease, stage 3 (moderate): Secondary | ICD-10-CM | POA: Insufficient documentation

## 2017-07-20 DIAGNOSIS — I484 Atypical atrial flutter: Secondary | ICD-10-CM

## 2017-07-20 DIAGNOSIS — Z8249 Family history of ischemic heart disease and other diseases of the circulatory system: Secondary | ICD-10-CM | POA: Insufficient documentation

## 2017-07-20 DIAGNOSIS — E1122 Type 2 diabetes mellitus with diabetic chronic kidney disease: Secondary | ICD-10-CM | POA: Insufficient documentation

## 2017-07-20 DIAGNOSIS — Z7901 Long term (current) use of anticoagulants: Secondary | ICD-10-CM | POA: Insufficient documentation

## 2017-07-20 DIAGNOSIS — I2510 Atherosclerotic heart disease of native coronary artery without angina pectoris: Secondary | ICD-10-CM | POA: Diagnosis not present

## 2017-07-20 DIAGNOSIS — R942 Abnormal results of pulmonary function studies: Secondary | ICD-10-CM | POA: Insufficient documentation

## 2017-07-20 DIAGNOSIS — Z79899 Other long term (current) drug therapy: Secondary | ICD-10-CM | POA: Insufficient documentation

## 2017-07-20 DIAGNOSIS — I10 Essential (primary) hypertension: Secondary | ICD-10-CM | POA: Diagnosis not present

## 2017-07-20 DIAGNOSIS — Z87891 Personal history of nicotine dependence: Secondary | ICD-10-CM | POA: Insufficient documentation

## 2017-07-20 DIAGNOSIS — I481 Persistent atrial fibrillation: Secondary | ICD-10-CM | POA: Insufficient documentation

## 2017-07-20 LAB — PULMONARY FUNCTION TEST
DL/VA % pred: 98 %
DL/VA: 5.07 ml/min/mmHg/L
DLCO UNC: 16.31 ml/min/mmHg
DLCO unc % pred: 57 %
FEF 25-75 Post: 1.87 L/sec
FEF 25-75 Pre: 1.48 L/sec
FEF2575-%Change-Post: 26 %
FEF2575-%Pred-Post: 109 %
FEF2575-%Pred-Pre: 86 %
FEV1-%CHANGE-POST: 10 %
FEV1-%PRED-PRE: 69 %
FEV1-%Pred-Post: 76 %
FEV1-POST: 1.51 L
FEV1-PRE: 1.36 L
FEV1FVC-%Change-Post: 1 %
FEV1FVC-%Pred-Pre: 106 %
FEV6-%Change-Post: 10 %
FEV6-%PRED-POST: 75 %
FEV6-%PRED-PRE: 68 %
FEV6-POST: 1.83 L
FEV6-PRE: 1.66 L
FEV6FVC-%CHANGE-POST: 0 %
FEV6FVC-%PRED-POST: 103 %
FEV6FVC-%PRED-PRE: 104 %
FVC-%Change-Post: 9 %
FVC-%Pred-Post: 72 %
FVC-%Pred-Pre: 66 %
FVC-POST: 1.84 L
FVC-Pre: 1.68 L
POST FEV6/FVC RATIO: 99 %
PRE FEV6/FVC RATIO: 100 %
Post FEV1/FVC ratio: 82 %
Pre FEV1/FVC ratio: 81 %
RV % PRED: 66 %
RV: 1.64 L
TLC % PRED: 65 %
TLC: 3.61 L

## 2017-07-20 LAB — BASIC METABOLIC PANEL
ANION GAP: 11 (ref 5–15)
BUN: 12 mg/dL (ref 6–20)
CALCIUM: 9.5 mg/dL (ref 8.9–10.3)
CO2: 27 mmol/L (ref 22–32)
Chloride: 102 mmol/L (ref 101–111)
Creatinine, Ser: 1.38 mg/dL — ABNORMAL HIGH (ref 0.44–1.00)
GFR calc Af Amer: 42 mL/min — ABNORMAL LOW (ref >60)
GFR, EST NON AFRICAN AMERICAN: 36 mL/min — AB (ref >60)
Glucose, Bld: 116 mg/dL — ABNORMAL HIGH (ref 65–99)
Potassium: 4 mmol/L (ref 3.5–5.1)
SODIUM: 140 mmol/L (ref 135–145)

## 2017-07-20 LAB — CBC
HCT: 36.1 % (ref 36.0–46.0)
Hemoglobin: 11.4 g/dL — ABNORMAL LOW (ref 12.0–15.0)
MCH: 29 pg (ref 26.0–34.0)
MCHC: 31.6 g/dL (ref 30.0–36.0)
MCV: 91.9 fL (ref 78.0–100.0)
PLATELETS: 262 10*3/uL (ref 150–400)
RBC: 3.93 MIL/uL (ref 3.87–5.11)
RDW: 14.2 % (ref 11.5–15.5)
WBC: 6.1 10*3/uL (ref 4.0–10.5)

## 2017-07-20 MED ORDER — AMIODARONE HCL 200 MG PO TABS: ORAL_TABLET | ORAL | 0 refills | Status: DC

## 2017-07-20 MED ORDER — ROSUVASTATIN CALCIUM 10 MG PO TABS: 10.0000 mg | ORAL_TABLET | Freq: Every day | ORAL | 3 refills | Status: AC

## 2017-07-20 MED ORDER — ALBUTEROL SULFATE (2.5 MG/3ML) 0.083% IN NEBU
2.5000 mg | INHALATION_SOLUTION | Freq: Once | RESPIRATORY_TRACT | Status: AC
  Administered 2017-07-20: 2.5 mg via RESPIRATORY_TRACT

## 2017-07-20 MED ORDER — AMLODIPINE BESYLATE 10 MG PO TABS: 10.0000 mg | ORAL_TABLET | Freq: Every day | ORAL | 6 refills | Status: AC

## 2017-07-20 NOTE — Progress Notes (Addendum)
Primary Care Physician: Glendale Chard, MD Referring Physician: Almyra Deforest, PA  Cardiologist: Dr. Carlena Sax Carrie Key is a 77 y.o. female with a h/o HTN, HLD, CAD, PAF, CKD stage III, type II DM, and history of breast cancer with simple mastectomy in 2014.  She was seen in hospital in March with afib with RVR. D/c on Cardizem 240 mg daily and Eliquis 5 mg bid. She went on to have cardioversion 4/9 but with ERAF.  She had repeat cardioversion 06/13/2017, but quickly went back into atrial fibrillation.  She is started on amiodarone load better over the last few days.  She is also having some dizziness upon standing.  She has stopped just about all of her medications recently as she thought that we want her to take only her heart medications.  She is not taking her diabetic meds nor anything for hyperlipidemia..    Today, denies symptoms of palpitations, chest pain, shortness of breath, orthopnea, PND, lower extremity edema, claudication, dizziness, presyncope, syncope, bleeding, or neurologic sequela. The patient is tolerating medications without difficulties.    Past Medical History:  Diagnosis Date  . Atrial fibrillation ([place])   . Atrial fibrillation with RVR ([place]) 05/08/2017  . Breast cancer ([place])   . Cancer ([place])   . CKD (chronic kidney disease), stage III ([place]) 07/10/2014  . Diabetes mellitus without complication ([place])   . Dyspnea   . GERD (gastroesophageal reflux disease)   . Heart murmur   . Hyperlipemia   . Hypertension   . Wears glasses    Past Surgical History:  Procedure Laterality Date  . ABDOMINAL HYSTERECTOMY    . BACK SURGERY  2002   lumbar lam  . CARDIAC CATHETERIZATION  2003  . CARDIOVERSION N/A 05/15/2017   Procedure: CARDIOVERSION;  Surgeon: Skeet Latch, MD;  Location: [facility];  Service: Cardiovascular;  Laterality: N/A;  . CARDIOVERSION N/A 06/13/2017   Procedure: CARDIOVERSION;  Surgeon: Josue Hector, MD;  Location: [facility] ENDOSCOPY;  Service:  Cardiovascular;  Laterality: N/A;  . COLONOSCOPY    . LEFT HEART CATH AND CORONARY ANGIOGRAPHY N/A 05/12/2017   Procedure: LEFT HEART CATH AND CORONARY ANGIOGRAPHY;  Surgeon: Leonie Man, MD;  Location: [facility] CV LAB;  Service: Cardiovascular;  Laterality: N/A;  . SIMPLE MASTECTOMY WITH AXILLARY SENTINEL NODE BIOPSY Right 12/03/2012   Procedure: RIGHT SIMPLE MASTECTOMY WITH RIGHT  AXILLARY SENTINEL LYMPH NODE BIOPSY;  Surgeon: Shann Medal, MD;  Location: [facility];  Service: General;  Laterality: Right;  . TEE WITHOUT CARDIOVERSION N/A 05/15/2017   Procedure: TRANSESOPHAGEAL ECHOCARDIOGRAM (TEE);  Surgeon: Skeet Latch, MD;  Location: [facility];  Service: Cardiovascular;  Laterality: N/A;  . UPPER GI ENDOSCOPY      Current Outpatient Medications  Medication Sig Dispense Refill  . amiodarone (PACERONE) 200 MG tablet Take 2 tablet twice a day until cardioversion then reduce to 1 tablet a day 60 tablet 0  . apixaban (ELIQUIS) 5 MG TABS tablet Take 1 tablet (5 mg total) by mouth 2 (two) times daily. 60 tablet 6  . carvedilol (COREG) 25 MG tablet Take 1 tablet (25 mg total) by mouth 2 (two) times daily. (Patient taking differently: Take 12.5 mg by mouth 2 (two) times daily. ) 180 tablet 3  . furosemide (LASIX) 40 MG tablet Take 40 mg by mouth daily.     Marland Kitchen gabapentin (NEURONTIN) 300 MG capsule Take 1 capsule (300 mg total) by mouth at bedtime. 30 capsule 6  .  hydrALAZINE (APRESOLINE) 100 MG tablet Take 1 tablet (100 mg total) by mouth every 8 (eight) hours. (Patient taking differently: Take 100 mg by mouth 2 (two) times daily. ) 270 tablet 3  . isosorbide dinitrate (ISORDIL) 10 MG tablet Take 10 mg by mouth daily.    . nitroGLYCERIN (NITROSTAT) 0.4 MG SL tablet Place 1 tablet (0.4 mg total) under the tongue every 5 (five) minutes as needed for chest pain. 25 tablet 1  . omeprazole (PRILOSEC) 40 MG capsule Take 40 mg by mouth daily.     . potassium chloride SA  (K-DUR,KLOR-CON) 20 MEQ tablet Take 1 tablet (20 mEq total) by mouth daily. 30 tablet 3  . amLODipine (NORVASC) 10 MG tablet Take 1 tablet (10 mg total) by mouth daily. 30 tablet 6  . rosuvastatin (CRESTOR) 10 MG tablet Take 1 tablet (10 mg total) by mouth daily. 90 tablet 3   No current facility-administered medications for this encounter.     No Known Allergies  Social History   Socioeconomic History  . Marital status: Widowed    Spouse name: Not on file  . Number of children: 5  . Years of education: 33  . Highest education level: Not on file  Occupational History  . Occupation: Retired    Comment: disability prior to retirement  Social Needs  . Financial resource strain: Not on file  . Food insecurity:    Worry: Not on file    Inability: Not on file  . Transportation needs:    Medical: Not on file    Non-medical: Not on file  Tobacco Use  . Smoking status: Former Smoker    Packs/day: 0.30    Years: 30.00    Pack years: 9.00    Types: Cigarettes    Last attempt to quit: 11/26/1988    Years since quitting: 28.6  . Smokeless tobacco: Never Used  Substance and Sexual Activity  . Alcohol use: Yes    Alcohol/week: 2.4 oz    Types: 4 Glasses of wine per week    Comment: occ  . Drug use: No  . Sexual activity: Yes    Birth control/protection: Post-menopausal  Lifestyle  . Physical activity:    Days per week: Not on file    Minutes per session: Not on file  . Stress: Not on file  Relationships  . Social connections:    Talks on phone: Not on file    Gets together: Not on file    Attends religious service: Not on file    Active member of club or organization: Not on file    Attends meetings of clubs or organizations: Not on file    Relationship status: Not on file  . Intimate partner violence:    Fear of current or ex partner: Not on file    Emotionally abused: Not on file    Physically abused: Not on file    Forced sexual activity: Not on file  Other Topics  Concern  . Not on file  Social History Narrative   Patient is single and lives alone.   Patient is retired.   Patient has five adult children.   Patient has a 11 grade education   Patient is right-handed.   Patient drinks one cup of coffee and two cups of tea daily.    Family History  Problem Relation Age of Onset  . Hypertension Mother   . Stroke Mother   . Hypertension Father   . Heart attack Father   .  Hypertension Paternal Grandmother     ROS- All systems are reviewed and negative except as per the HPI above  Physical Exam: Vitals:   07/20/17 1359  BP: (!) 158/96  Pulse: 91  Weight: 232 lb (105.2 kg)  Height: 5\' 7"  (1.702 m)   Wt Readings from Last 3 Encounters:  07/20/17 232 lb (105.2 kg)  07/11/17 232 lb 9.6 oz (105.5 kg)  06/21/17 229 lb (103.9 kg)    Labs: Lab Results  Component Value Date   NA 142 07/06/2017   K 4.3 07/06/2017   CL 103 07/06/2017   CO2 23 07/06/2017   GLUCOSE 86 07/06/2017   BUN 16 07/06/2017   CREATININE 1.27 (H) 07/06/2017   CALCIUM 9.6 07/06/2017   MG 2.5 (H) 05/09/2017   Lab Results  Component Value Date   INR 1.34 05/15/2017   Lab Results  Component Value Date   CHOL 144 04/27/2016   HDL 59 04/27/2016   LDLCALC 58 04/27/2016   TRIG 137 04/27/2016   ROS:  Please see the history of present illness.   Otherwise, review of systems is positive for none.   All other systems are reviewed and negative.   PHYSICAL EXAM: VS:  BP (!) 158/96   Pulse 91   Ht 5\' 7"  (1.702 m)   Wt 232 lb (105.2 kg)   BMI 36.34 kg/m  , BMI Body mass index is 36.34 kg/m. GEN: Well nourished, well developed, in no acute distress  HEENT: normal  Neck: no JVD, carotid bruits, or masses Cardiac: iRRR; no murmurs, rubs, or gallops,no edema  Respiratory:  clear to auscultation bilaterally, normal work of breathing GI: soft, nontender, nondistended, + BS MS: no deformity or atrophy  Skin: warm and dry Neuro:  Strength and sensation are  intact Psych: euthymic mood, full affect  EKG:  EKG is ordered today. Personal review of the ekg ordered shows atrial flutter, rate 91LAD   Assessment and Plan: 1. Persistent  afib Presents to clinic today in atrial flutter.  She was recently started on amiodarone and is not yet converted.  She is on 200 mg twice a day.  To get enough in her system, will increase to 400 mg twice a day.  She can go back down to 200 mg a day after that.  We will plan for cardioversion in 10 days.  2.  Hypertension: Blood pressure is elevated today.  She has stopped most of her blood pressure medications.  She was previously taking amlodipine.  We will restart that today and increase to 10 mg.  3.  Hyperlipidemia: She has not been taking her Crestor.  Will restart today.  This can be adjusted by her primary physician.  I have also asked her to call her primary physician to discuss which diabetes medicine she needs to be taking.  Will Curt Bears, MD 07/20/2017 2:35 PM

## 2017-07-20 NOTE — Patient Instructions (Signed)
Restart Crestor 10mg  once a day  Restart Amlodipine at 10mg  once a day  Increase Amiodarone to 2 of the 200mg  tablets twice a day until June 24th then reduce to 1 tablet once a day  Cardioversion scheduled for Monday, June 24th  - Arrive at the [facility] and go to admitting at [facility] not eat or drink anything after midnight the night prior to your procedure.  - Take all your morning medication with a sip of water prior to arrival.  - You will not be able to drive home after your procedure.

## 2017-07-30 ENCOUNTER — Encounter (HOSPITAL_COMMUNITY): Payer: Self-pay | Admitting: Certified Registered"

## 2017-07-30 NOTE — Anesthesia Preprocedure Evaluation (Deleted)
Anesthesia Evaluation    Reviewed: Allergy & Precautions, H&P , Patient's Chart, lab work & pertinent test results  Airway        Dental   Pulmonary neg pulmonary ROS, former smoker,           Cardiovascular Exercise Tolerance: Good hypertension, Pt. on medications and Pt. on home beta blockers negative cardio ROS  + dysrhythmias Atrial Fibrillation      Neuro/Psych negative neurological ROS  negative psych ROS   GI/Hepatic negative GI ROS, Neg liver ROS, GERD  Medicated and Controlled,  Endo/Other  negative endocrine ROSdiabetes  Renal/GU Renal InsufficiencyRenal diseasenegative Renal ROS  negative genitourinary   Musculoskeletal   Abdominal   Peds  Hematology negative hematology ROS (+)   Anesthesia Other Findings   Reproductive/Obstetrics negative OB ROS                             Anesthesia Physical Anesthesia Plan  ASA: III  Anesthesia Plan: General   Post-op Pain Management:    Induction: Intravenous  PONV Risk Score and Plan: 3 and Treatment may vary due to age or medical condition  Airway Management Planned: Mask  Additional Equipment:   Intra-op Plan:   Post-operative Plan:   Informed Consent: I have reviewed the patients History and Physical, chart, labs and discussed the procedure including the risks, benefits and alternatives for the proposed anesthesia with the patient or authorized representative who has indicated his/her understanding and acceptance.   Dental advisory given  Plan Discussed with: CRNA  Anesthesia Plan Comments:         Anesthesia Quick Evaluation

## 2017-07-31 ENCOUNTER — Encounter (HOSPITAL_COMMUNITY): Admission: RE | Payer: Self-pay | Source: Ambulatory Visit

## 2017-07-31 ENCOUNTER — Telehealth (HOSPITAL_COMMUNITY): Payer: Self-pay | Admitting: *Deleted

## 2017-07-31 ENCOUNTER — Ambulatory Visit (HOSPITAL_COMMUNITY): Admission: RE | Admit: 2017-07-31 | Payer: Medicare HMO | Source: Ambulatory Visit | Admitting: Internal Medicine

## 2017-07-31 ENCOUNTER — Other Ambulatory Visit (HOSPITAL_COMMUNITY): Payer: Self-pay | Admitting: *Deleted

## 2017-07-31 SURGERY — CARDIOVERSION
Anesthesia: General

## 2017-07-31 NOTE — Telephone Encounter (Signed)
Pt cardioversion was rescheduled for Thursday, June 27th at 2pm - pt notified to arrive at 12:30pm - NPO after MN sip of water with medications. Pt verbalized understanding.

## 2017-08-03 ENCOUNTER — Other Ambulatory Visit: Payer: Self-pay

## 2017-08-03 ENCOUNTER — Encounter (HOSPITAL_COMMUNITY)
Admission: RE | Disposition: A | Discharge status: Home / Self Care | Payer: Self-pay | Source: Ambulatory Visit | Attending: Cardiovascular Disease

## 2017-08-03 ENCOUNTER — Encounter (HOSPITAL_COMMUNITY): Payer: Self-pay | Admitting: *Deleted

## 2017-08-03 ENCOUNTER — Telehealth (HOSPITAL_COMMUNITY): Payer: Self-pay | Admitting: *Deleted

## 2017-08-03 ENCOUNTER — Other Ambulatory Visit (HOSPITAL_COMMUNITY): Payer: Self-pay | Admitting: *Deleted

## 2017-08-03 ENCOUNTER — Ambulatory Visit (HOSPITAL_COMMUNITY)
Admission: RE | Admit: 2017-08-03 | Discharge: 2017-08-03 | Disposition: A | Discharge status: Home / Self Care | Payer: Medicare HMO | Source: Ambulatory Visit | Attending: Cardiovascular Disease | Admitting: Cardiovascular Disease

## 2017-08-03 DIAGNOSIS — I480 Paroxysmal atrial fibrillation: Secondary | ICD-10-CM | POA: Diagnosis not present

## 2017-08-03 DIAGNOSIS — Z538 Procedure and treatment not carried out for other reasons: Secondary | ICD-10-CM | POA: Diagnosis not present

## 2017-08-03 DIAGNOSIS — I481 Persistent atrial fibrillation: Secondary | ICD-10-CM

## 2017-08-03 DIAGNOSIS — R942 Abnormal results of pulmonary function studies: Secondary | ICD-10-CM

## 2017-08-03 SURGERY — CANCELLED PROCEDURE

## 2017-08-03 MED ORDER — AMIODARONE HCL 200 MG PO TABS: 200.0000 mg | ORAL_TABLET | Freq: Every day | ORAL | 6 refills | Status: AC

## 2017-08-03 NOTE — Telephone Encounter (Signed)
Called patient instructed to increase lasix to BID for 3 days then reduce back to once a day. Pt states she can tell a difference in how she feels but still short of breath with exertion. Pt reminded to decrease amiodarone to 200mg  once a day. Pt verbalized understanding.

## 2017-08-03 NOTE — H&P (Signed)
Procedure canceled

## 2017-08-03 NOTE — Progress Notes (Signed)
Patient admitted to Endo for Cardioversion. Patient placed on monitor and appeared to be in Sinus [facility]. EKG obtained and MD confirmed SB. Md instructed patient to continue medications and keep follow up appointments. Case cancelled prior to patient entering the procedure room.

## 2017-08-03 NOTE — Progress Notes (Signed)
Presented in normal sinus rhythm. Procedure canceled. Still has exertional dyspnea. There is JVD and pedal edema on exam. Asked her to contact Dr. Jacalyn Lefevre office as he may recommend diuretic dose adjustment.  Sanda Klein, MD, [facility] [phone] office [phone] pager

## 2017-08-15 ENCOUNTER — Encounter (HOSPITAL_COMMUNITY): Payer: Self-pay | Admitting: Nurse Practitioner

## 2017-08-15 ENCOUNTER — Ambulatory Visit (HOSPITAL_COMMUNITY)
Admission: RE | Admit: 2017-08-15 | Discharge: 2017-08-15 | Disposition: A | Discharge status: Home / Self Care | Payer: Medicare HMO | Source: Ambulatory Visit | Attending: Nurse Practitioner | Admitting: Nurse Practitioner

## 2017-08-15 VITALS — BP 142/78 | HR 68 | Ht 67.0 in | Wt 230.0 lb

## 2017-08-15 DIAGNOSIS — N183 Chronic kidney disease, stage 3 (moderate): Secondary | ICD-10-CM | POA: Insufficient documentation

## 2017-08-15 DIAGNOSIS — Z853 Personal history of malignant neoplasm of breast: Secondary | ICD-10-CM | POA: Insufficient documentation

## 2017-08-15 DIAGNOSIS — K219 Gastro-esophageal reflux disease without esophagitis: Secondary | ICD-10-CM | POA: Diagnosis not present

## 2017-08-15 DIAGNOSIS — I4819 Other persistent atrial fibrillation: Secondary | ICD-10-CM

## 2017-08-15 DIAGNOSIS — Z7901 Long term (current) use of anticoagulants: Secondary | ICD-10-CM | POA: Diagnosis not present

## 2017-08-15 DIAGNOSIS — Z9012 Acquired absence of left breast and nipple: Secondary | ICD-10-CM | POA: Insufficient documentation

## 2017-08-15 DIAGNOSIS — I129 Hypertensive chronic kidney disease with stage 1 through stage 4 chronic kidney disease, or unspecified chronic kidney disease: Secondary | ICD-10-CM | POA: Diagnosis not present

## 2017-08-15 DIAGNOSIS — I481 Persistent atrial fibrillation: Secondary | ICD-10-CM | POA: Diagnosis not present

## 2017-08-15 DIAGNOSIS — I480 Paroxysmal atrial fibrillation: Secondary | ICD-10-CM | POA: Diagnosis not present

## 2017-08-15 DIAGNOSIS — Z79899 Other long term (current) drug therapy: Secondary | ICD-10-CM | POA: Insufficient documentation

## 2017-08-15 DIAGNOSIS — E1122 Type 2 diabetes mellitus with diabetic chronic kidney disease: Secondary | ICD-10-CM | POA: Diagnosis not present

## 2017-08-15 DIAGNOSIS — Z87891 Personal history of nicotine dependence: Secondary | ICD-10-CM | POA: Insufficient documentation

## 2017-08-15 DIAGNOSIS — R9431 Abnormal electrocardiogram [ECG] [EKG]: Secondary | ICD-10-CM | POA: Insufficient documentation

## 2017-08-15 DIAGNOSIS — I2510 Atherosclerotic heart disease of native coronary artery without angina pectoris: Secondary | ICD-10-CM | POA: Insufficient documentation

## 2017-08-15 DIAGNOSIS — E785 Hyperlipidemia, unspecified: Secondary | ICD-10-CM | POA: Diagnosis not present

## 2017-08-15 DIAGNOSIS — Z8249 Family history of ischemic heart disease and other diseases of the circulatory system: Secondary | ICD-10-CM | POA: Diagnosis not present

## 2017-08-15 DIAGNOSIS — Z9889 Other specified postprocedural states: Secondary | ICD-10-CM | POA: Diagnosis not present

## 2017-08-15 DIAGNOSIS — Z90710 Acquired absence of both cervix and uterus: Secondary | ICD-10-CM | POA: Diagnosis not present

## 2017-08-15 DIAGNOSIS — Z823 Family history of stroke: Secondary | ICD-10-CM | POA: Insufficient documentation

## 2017-08-15 NOTE — Patient Instructions (Signed)
Move the time you take Imdur and Amlodipine to lunch time  Pulmonary office [phone] if you havent heard from this by Friday.

## 2017-08-15 NOTE — Progress Notes (Signed)
Primary Care Physician: Glendale Chard, MD Referring Physician: Almyra Deforest, PA  Cardiologist: Dr. Carlena Sax [place] is a 77 y.o. female with a h/o HTN, HLD, CAD, PAF, CKD stage III, type II DM, and history of breast cancer with simple mastectomy in 2014.  She was seen in [place] in March with afib with RVR. D/c on Cardizem 240 mg daily and Eliquis 5 mg bid. She went on to have cardioversion 4/9 but with ERAF.  She had repeat cardioversion 06/13/2017, but quickly went back into atrial fibrillation.  She is started on amiodarone load better over the last few days.  She is also having some dizziness upon standing.  She has stopped just about all of her medications recently as she thought that we want her to take only her heart medications.  She is not taking her diabetic meds nor anything for hyperlipidemia.  F/u in fib clinic, 7/9. She is her to f/u cardioversion. She did have successful cardioversion and continues in SR. She is on amiodarone 200 mg a day. Feels improved back on meds and in SR. Breathing is better.Pending evaluation with pulmonology for abnormal PFT's and if she can continue on amiodarone. SHe feels wiped out in the am after taking all her meds. Will move 2 meds to  lunch to see if this helps. Dr. Loletha Grayer thought at time of cardioversion that she was fluid overloaded and her lasix was doubled   x 3 days and she feels improved..  Today, denies symptoms of palpitations, chest pain, shortness of breath, orthopnea, PND, lower extremity edema, claudication, dizziness, presyncope, syncope, bleeding, or neurologic sequela. The patient is tolerating medications without difficulties.    Past Medical History:  Diagnosis Date  . Atrial fibrillation ([place])   . Atrial fibrillation with RVR ([place]) 05/08/2017  . Breast cancer ([place])   . Cancer ([place])   . CKD (chronic kidney disease), stage III ([place]) 07/10/2014  . Diabetes mellitus without complication ([place])   . Dyspnea   . GERD  (gastroesophageal reflux disease)   . Heart murmur   . Hyperlipemia   . Hypertension   . Wears glasses    Past Surgical History:  Procedure Laterality Date  . ABDOMINAL HYSTERECTOMY    . BACK SURGERY  2002   lumbar lam  . CARDIAC CATHETERIZATION  2003  . CARDIOVERSION N/A 05/15/2017   Procedure: CARDIOVERSION;  Surgeon: Skeet Latch, MD;  Location: [facility];  Service: Cardiovascular;  Laterality: N/A;  . CARDIOVERSION N/A 06/13/2017   Procedure: CARDIOVERSION;  Surgeon: Josue Hector, MD;  Location: [facility];  Service: Cardiovascular;  Laterality: N/A;  . COLONOSCOPY    . LEFT HEART CATH AND CORONARY ANGIOGRAPHY N/A 05/12/2017   Procedure: LEFT HEART CATH AND CORONARY ANGIOGRAPHY;  Surgeon: Leonie Man, MD;  Location: [facility];  Service: Cardiovascular;  Laterality: N/A;  . SIMPLE MASTECTOMY WITH AXILLARY SENTINEL NODE BIOPSY Right 12/03/2012   Procedure: RIGHT SIMPLE MASTECTOMY WITH RIGHT  AXILLARY SENTINEL LYMPH NODE BIOPSY;  Surgeon: Shann Medal, MD;  Location: [facility];  Service: General;  Laterality: Right;  . TEE WITHOUT CARDIOVERSION N/A 05/15/2017   Procedure: TRANSESOPHAGEAL ECHOCARDIOGRAM (TEE);  Surgeon: Skeet Latch, MD;  Location: [facility];  Service: Cardiovascular;  Laterality: N/A;  . UPPER GI ENDOSCOPY      Current Outpatient Medications  Medication Sig Dispense Refill  . amiodarone (PACERONE) 200 MG tablet Take 1 tablet (200 mg total) by mouth daily. 30 tablet 6  .  amLODipine (NORVASC) 10 MG tablet Take 1 tablet (10 mg total) by mouth daily. 30 tablet 6  . apixaban (ELIQUIS) 5 MG TABS tablet Take 1 tablet (5 mg total) by mouth 2 (two) times daily. 60 tablet 6  . carvedilol (COREG) 25 MG tablet Take 1 tablet (25 mg total) by mouth 2 (two) times daily. 180 tablet 3  . furosemide (LASIX) 40 MG tablet Take 40 mg by mouth daily.     Marland Kitchen gabapentin (NEURONTIN) 300 MG capsule Take 1 capsule (300 mg total) by mouth at bedtime.  (Patient taking differently: Take 300 mg by mouth at bedtime as needed (pain). ) 30 capsule 6  . hydrALAZINE (APRESOLINE) 100 MG tablet Take 1 tablet (100 mg total) by mouth every 8 (eight) hours. (Patient taking differently: Take 100 mg by mouth daily. ) 270 tablet 3  . isosorbide dinitrate (ISORDIL) 10 MG tablet Take 10 mg by mouth daily.    . nitroGLYCERIN (NITROSTAT) 0.4 MG SL tablet Place 1 tablet (0.4 mg total) under the tongue every 5 (five) minutes as needed for chest pain. 25 tablet 1  . omeprazole (PRILOSEC) 40 MG capsule Take 40 mg by mouth daily.     . potassium chloride SA (K-DUR,KLOR-CON) 20 MEQ tablet Take 1 tablet (20 mEq total) by mouth daily. 30 tablet 3  . rosuvastatin (CRESTOR) 10 MG tablet Take 1 tablet (10 mg total) by mouth daily. 90 tablet 3   No current facility-administered medications for this encounter.     No Known Allergies  Social History   Socioeconomic History  . Marital status: Widowed    Spouse name: Not on file  . Number of children: 5  . Years of education: 68  . Highest education level: Not on file  Occupational History  . Occupation: Retired    Comment: disability prior to retirement  Social Needs  . Financial resource strain: Not on file  . Food insecurity:    Worry: Not on file    Inability: Not on file  . Transportation needs:    Medical: Not on file    Non-medical: Not on file  Tobacco Use  . Smoking status: Former Smoker    Packs/day: 0.30    Years: 30.00    Pack years: 9.00    Types: Cigarettes    Last attempt to quit: 11/26/1988    Years since quitting: 28.7  . Smokeless tobacco: Never Used  Substance and Sexual Activity  . Alcohol use: Yes    Alcohol/week: 2.4 oz    Types: 4 Glasses of wine per week    Comment: occ  . Drug use: No  . Sexual activity: Yes    Birth control/protection: Post-menopausal  Lifestyle  . Physical activity:    Days per week: Not on file    Minutes per session: Not on file  . Stress: Not on file   Relationships  . Social connections:    Talks on phone: Not on file    Gets together: Not on file    Attends religious service: Not on file    Active member of club or organization: Not on file    Attends meetings of clubs or organizations: Not on file    Relationship status: Not on file  . Intimate partner violence:    Fear of current or ex partner: Not on file    Emotionally abused: Not on file    Physically abused: Not on file    Forced sexual activity: Not on file  Other Topics Concern  . Not on file  Social History Narrative   Patient is single and lives alone.   Patient is retired.   Patient has five adult children.   Patient has a 11 grade education   Patient is right-handed.   Patient drinks one cup of coffee and two cups of tea daily.    Family History  Problem Relation Age of Onset  . Hypertension Mother   . Stroke Mother   . Hypertension Father   . Heart attack Father   . Hypertension Paternal Grandmother     ROS- All systems are reviewed and negative except as per the HPI above  Physical Exam: Vitals:   08/15/17 1344  BP: (!) 142/78  Pulse: 68  Weight: 230 lb (104.3 kg)  Height: 5\' 7"  (1.702 m)   Wt Readings from Last 3 Encounters:  08/15/17 230 lb (104.3 kg)  08/03/17 232 lb (105.2 kg)  07/20/17 232 lb (105.2 kg)    Labs: Lab Results  Component Value Date   NA 140 07/20/2017   K 4.0 07/20/2017   CL 102 07/20/2017   CO2 27 07/20/2017   GLUCOSE 116 (H) 07/20/2017   BUN 12 07/20/2017   CREATININE 1.38 (H) 07/20/2017   CALCIUM 9.5 07/20/2017   MG 2.5 (H) 05/09/2017   Lab Results  Component Value Date   INR 1.34 05/15/2017   Lab Results  Component Value Date   CHOL 144 04/27/2016   HDL 59 04/27/2016   LDLCALC 58 04/27/2016   TRIG 137 04/27/2016   ROS:  Please see the history of present illness.   Otherwise, review of systems is positive for none.   All other systems are reviewed and negative.   PHYSICAL EXAM: VS:  BP (!) 142/78    Pulse 68   Ht 5\' 7"  (1.702 m)   Wt 230 lb (104.3 kg)   BMI 36.02 kg/m  , BMI Body mass index is 36.02 kg/m. GEN: Well nourished, well developed, in no acute distress  HEENT: normal  Neck: no JVD, carotid bruits, or masses Cardiac:RRR; no murmurs, rubs, or gallops,no edema  Respiratory:  clear to auscultation bilaterally, normal work of breathing GI: soft, nontender, nondistended, + BS MS: no deformity or atrophy  Skin: warm and dry Neuro:  Strength and sensation are intact Psych: euthymic mood, full affect  EKG:  EKG is ordered today. NSR at 88 bpm, PR int 166 ms, qrs int 110 ms, qtc 461 ms  Assessment and Plan: 1. Persistent  afib Successful cardioversion and is maintaining SR today She feels improved  Continue amiodarone at 200 mg a day Continue eliquis 5 mg bid Pending pulmonology eval for abnormal PFT's, question if she can continue with amiodarone, consult has been placed  2.  Hypertension Stable, has restarted  meds but feels "wiped out" in the am after she takes all her meds Have instructed pt to take 2 of her meds at lunch, amlodipine and Imdur  3.  Hyperlipidemia Dr. Curt Bears placed back on Crestor  F/u with Dr. Stanford Breed 7/26  Geroge Baseman. Garnet Over[place], [place] [address] [place], [place] [address] [phone]

## 2017-08-16 ENCOUNTER — Institutional Professional Consult (permissible substitution): Payer: Medicare HMO | Admitting: Pulmonary Disease

## 2017-08-22 ENCOUNTER — Other Ambulatory Visit: Payer: Self-pay | Admitting: *Deleted

## 2017-08-22 NOTE — Patient Outreach (Signed)
[facility]) Care Management  08/22/2017  Carrie [facility] 07-12-1940 [id number]   RN Health Coach Monthly Outreach  Referral Date:06/21/2017 Referral Source:Telephonic Screening Reason for Referral:Disease Management Education Insurance:[employer] Medicare   Outreach Attempt:  Outreach attempt #1 to patient for monthly follow up.  Patient answered and stated she was in the middle of cooking dinner.  Requested telephone call back.   Plan:  RN Health Coach will send unsuccessful outreach letter to patient.  RN Health Coach will make another outreach attempt to patient within 3-4 business days if no return call back from patient.   [place] [phone] [name]

## 2017-08-22 NOTE — Progress Notes (Signed)
HPI: Follow-up atrial fibrillation.  Patient seen March 2018 with atrial fibrillation.  Last echocardiogram April 2019 showed normal LV function, mild aortic stenosis, mildly dilated ascending aorta, mild to moderate mitral regurgitation and mild to moderate tricuspid regurgitation.  Cardiac catheterization April 2019 showed a 60% first marginal, ejection fraction 45 to 50% and 1-2+ mitral regurgitation.  Medical therapy recommended.  Patient had TEE guided cardioversion April 2019.  Ejection fraction 40 to 45%, mild mitral regurgitation and tricuspid regurgitation.  Patient had recurrent atrial arrhythmias and was ultimately placed on amiodarone.  At last office visit she was in sinus rhythm.  She was scheduled to see pulmonary to see if amiodarone could be continued as she has abnormal PFTs.  She has also been treated for CHF with diuretics.  Since last seen patient has mild dyspnea on exertion and pedal edema but improved since cardioversion.  No exertional chest pain.  She fell recently and hit her head.  Head CT was negative.  Current Outpatient Medications  Medication Sig Dispense Refill  . albuterol (PROVENTIL HFA;VENTOLIN HFA) 108 (90 Base) MCG/ACT inhaler Inhale 1-2 puffs into the lungs every 6 (six) hours as needed for wheezing or shortness of breath.    Marland Kitchen amiodarone (PACERONE) 200 MG tablet Take 1 tablet (200 mg total) by mouth daily. 30 tablet 6  . amLODipine (NORVASC) 10 MG tablet Take 1 tablet (10 mg total) by mouth daily. 30 tablet 6  . apixaban (ELIQUIS) 5 MG TABS tablet Take 1 tablet (5 mg total) by mouth 2 (two) times daily. 60 tablet 6  . carvedilol (COREG) 25 MG tablet Take 1 tablet (25 mg total) by mouth 2 (two) times daily. 180 tablet 3  . furosemide (LASIX) 40 MG tablet Take 40 mg by mouth daily.     Marland Kitchen gabapentin (NEURONTIN) 300 MG capsule Take 1 capsule (300 mg total) by mouth at bedtime. (Patient taking differently: Take 300 mg by mouth at bedtime as needed (pain). ) 30  capsule 6  . hydrALAZINE (APRESOLINE) 100 MG tablet Take 1 tablet (100 mg total) by mouth every 8 (eight) hours. (Patient taking differently: Take 100 mg by mouth daily. ) 270 tablet 3  . isosorbide dinitrate (ISORDIL) 10 MG tablet Take 10 mg by mouth daily.    . nitroGLYCERIN (NITROSTAT) 0.4 MG SL tablet Place 1 tablet (0.4 mg total) under the tongue every 5 (five) minutes as needed for chest pain. 25 tablet 1  . omeprazole (PRILOSEC) 40 MG capsule Take 40 mg by mouth daily.     . potassium chloride SA (K-DUR,KLOR-CON) 20 MEQ tablet Take 1 tablet (20 mEq total) by mouth daily. 30 tablet 3  . rosuvastatin (CRESTOR) 10 MG tablet Take 1 tablet (10 mg total) by mouth daily. 90 tablet 3   No current facility-administered medications for this visit.      Past Medical History:  Diagnosis Date  . Atrial fibrillation ([place])   . Atrial fibrillation with RVR ([place]) 05/08/2017  . Breast cancer ([place])   . Cancer ([place])   . CKD (chronic kidney disease), stage III ([place]) 07/10/2014  . Diabetes mellitus without complication ([place])   . Dyspnea   . GERD (gastroesophageal reflux disease)   . Heart murmur   . Hyperlipemia   . Hypertension   . Wears glasses     Past Surgical History:  Procedure Laterality Date  . ABDOMINAL HYSTERECTOMY    . BACK SURGERY  2002   lumbar lam  . CARDIAC  CATHETERIZATION  2003  . CARDIOVERSION N/A 05/15/2017   Procedure: CARDIOVERSION;  Surgeon: Skeet Latch, MD;  Location: [facility];  Service: Cardiovascular;  Laterality: N/A;  . CARDIOVERSION N/A 06/13/2017   Procedure: CARDIOVERSION;  Surgeon: Josue Hector, MD;  Location: [facility] ENDOSCOPY;  Service: Cardiovascular;  Laterality: N/A;  . COLONOSCOPY    . LEFT HEART CATH AND CORONARY ANGIOGRAPHY N/A 05/12/2017   Procedure: LEFT HEART CATH AND CORONARY ANGIOGRAPHY;  Surgeon: Leonie Man, MD;  Location: [facility] CV LAB;  Service: Cardiovascular;  Laterality: N/A;  . SIMPLE MASTECTOMY WITH AXILLARY SENTINEL NODE BIOPSY  Right 12/03/2012   Procedure: RIGHT SIMPLE MASTECTOMY WITH RIGHT  AXILLARY SENTINEL LYMPH NODE BIOPSY;  Surgeon: Shann Medal, MD;  Location: [facility];  Service: General;  Laterality: Right;  . TEE WITHOUT CARDIOVERSION N/A 05/15/2017   Procedure: TRANSESOPHAGEAL ECHOCARDIOGRAM (TEE);  Surgeon: Skeet Latch, MD;  Location: [facility];  Service: Cardiovascular;  Laterality: N/A;  . UPPER GI ENDOSCOPY      Social History   Socioeconomic History  . Marital status: Widowed    Spouse name: Not on file  . Number of children: 5  . Years of education: 60  . Highest education level: Not on file  Occupational History  . Occupation: Retired    Comment: disability prior to retirement  Social Needs  . Financial resource strain: Not on file  . Food insecurity:    Worry: Not on file    Inability: Not on file  . Transportation needs:    Medical: Not on file    Non-medical: Not on file  Tobacco Use  . Smoking status: Former Smoker    Packs/day: 0.30    Years: 30.00    Pack years: 9.00    Types: Cigarettes    Last attempt to quit: 11/26/1988    Years since quitting: 28.7  . Smokeless tobacco: Never Used  Substance and Sexual Activity  . Alcohol use: Yes    Alcohol/week: 2.4 oz    Types: 4 Glasses of wine per week    Comment: occ  . Drug use: No  . Sexual activity: Yes    Birth control/protection: Post-menopausal  Lifestyle  . Physical activity:    Days per week: Not on file    Minutes per session: Not on file  . Stress: Not on file  Relationships  . Social connections:    Talks on phone: Not on file    Gets together: Not on file    Attends religious service: Not on file    Active member of club or organization: Not on file    Attends meetings of clubs or organizations: Not on file    Relationship status: Not on file  . Intimate partner violence:    Fear of current or ex partner: Not on file    Emotionally abused: Not on file    Physically abused: Not  on file    Forced sexual activity: Not on file  Other Topics Concern  . Not on file  Social History Narrative   Patient is single and lives alone.   Patient is retired.   Patient has five adult children.   Patient has a 11 grade education   Patient is right-handed.   Patient drinks one cup of coffee and two cups of tea daily.    Family History  Problem Relation Age of Onset  . Hypertension Mother   . Stroke Mother   . Hypertension Father   .  Heart attack Father   . Hypertension Paternal Grandmother     ROS: no fevers or chills, productive cough, hemoptysis, dysphasia, odynophagia, melena, hematochezia, dysuria, hematuria, rash, seizure activity, orthopnea, PND, claudication. Remaining systems are negative.  Physical Exam: Well-developed well-nourished in no acute distress.  Skin is warm and dry.  HEENT is normal.  Neck is supple.  Chest is clear to auscultation with normal expansion.  Cardiovascular exam is regular rate and rhythm.  Abdominal exam nontender or distended. No masses palpated. Extremities show no edema. neuro grossly intact   A/P  1 paroxysmal atrial fibrillation-patient remains in sinus rhythm today.  Continue amiodarone at present dose.  Continue apixaban.  She is scheduled to see pulmonary concerning issue of continuing amiodarone.  If it is continued plan to check liver functions, chest x-ray and TSH when she returns in 3 months.  2 chronic combined systolic/diastolic congestive heart failure-patient's volume status appears to be improving after cardioversion.  Continue present dose of Lasix.  Check potassium and renal function.  We will repeat echocardiogram when she returns in 3 months.  Hopefully now that sinus has been reestablished her LV function will have normalized.  3 coronary artery disease-nonobstructive on previous catheterization.  No aspirin given need for anticoagulation.  Continue statin.  4 hypertension-pressure is controlled today.   Continue present medications.  5 hyperlipidemia-continue statin.  6 thoracic aortic aneurysm-we will plan follow-up CTA April 2019.  7 chronic stage III kidney disease-we will plan repeat potassium and renal function today.  Kirk Ruths, MD

## 2017-08-25 ENCOUNTER — Other Ambulatory Visit: Payer: Self-pay | Admitting: *Deleted

## 2017-08-25 NOTE — Patient Outreach (Addendum)
[facility]) Care Management  08/25/2017  Bryce Kimble [birth year] [id number]   RN Health Coach Monthly Outreach  Referral Date:06/21/2017 Referral Source:Telephonic Screening Reason for Referral:Disease Management Education Insurance:[employer] Medicare   Outreach Attempt:  Outreach attempt #2 to patient for monthly follow up. Female answered and stated patient was not home.  HIPAA compliant voice message left.  Plan:  RN Health Coach will make another outreach attempt to patient within 10 business days if no return call back from patient.  [place] [phone] [email or web address]

## 2017-08-26 ENCOUNTER — Observation Stay (HOSPITAL_COMMUNITY)
Admission: EM | Admit: 2017-08-26 | Discharge: 2017-08-27 | Disposition: A | Discharge status: Home / Self Care | Payer: Medicare HMO | Attending: Family Medicine | Admitting: Family Medicine

## 2017-08-26 ENCOUNTER — Emergency Department (HOSPITAL_COMMUNITY): Payer: Medicare HMO

## 2017-08-26 ENCOUNTER — Other Ambulatory Visit: Payer: Self-pay

## 2017-08-26 ENCOUNTER — Encounter (HOSPITAL_COMMUNITY): Payer: Self-pay | Admitting: Emergency Medicine

## 2017-08-26 DIAGNOSIS — S0101XA Laceration without foreign body of scalp, initial encounter: Secondary | ICD-10-CM | POA: Diagnosis not present

## 2017-08-26 DIAGNOSIS — N183 Chronic kidney disease, stage 3 unspecified: Secondary | ICD-10-CM | POA: Diagnosis present

## 2017-08-26 DIAGNOSIS — Z87891 Personal history of nicotine dependence: Secondary | ICD-10-CM | POA: Diagnosis not present

## 2017-08-26 DIAGNOSIS — R918 Other nonspecific abnormal finding of lung field: Secondary | ICD-10-CM | POA: Insufficient documentation

## 2017-08-26 DIAGNOSIS — R000 Tachycardia, unspecified: Secondary | ICD-10-CM | POA: Diagnosis not present

## 2017-08-26 DIAGNOSIS — Z823 Family history of stroke: Secondary | ICD-10-CM | POA: Insufficient documentation

## 2017-08-26 DIAGNOSIS — Z955 Presence of coronary angioplasty implant and graft: Secondary | ICD-10-CM | POA: Insufficient documentation

## 2017-08-26 DIAGNOSIS — Z7901 Long term (current) use of anticoagulants: Secondary | ICD-10-CM | POA: Insufficient documentation

## 2017-08-26 DIAGNOSIS — W19XXXA Unspecified fall, initial encounter: Secondary | ICD-10-CM | POA: Diagnosis not present

## 2017-08-26 DIAGNOSIS — Z8249 Family history of ischemic heart disease and other diseases of the circulatory system: Secondary | ICD-10-CM | POA: Insufficient documentation

## 2017-08-26 DIAGNOSIS — I481 Persistent atrial fibrillation: Secondary | ICD-10-CM | POA: Diagnosis not present

## 2017-08-26 DIAGNOSIS — S199XXA Unspecified injury of neck, initial encounter: Secondary | ICD-10-CM | POA: Diagnosis not present

## 2017-08-26 DIAGNOSIS — E876 Hypokalemia: Secondary | ICD-10-CM | POA: Diagnosis present

## 2017-08-26 DIAGNOSIS — Z9889 Other specified postprocedural states: Secondary | ICD-10-CM | POA: Insufficient documentation

## 2017-08-26 DIAGNOSIS — R9431 Abnormal electrocardiogram [ECG] [EKG]: Secondary | ICD-10-CM | POA: Diagnosis present

## 2017-08-26 DIAGNOSIS — R55 Syncope and collapse: Secondary | ICD-10-CM | POA: Diagnosis present

## 2017-08-26 DIAGNOSIS — Z90710 Acquired absence of both cervix and uterus: Secondary | ICD-10-CM | POA: Insufficient documentation

## 2017-08-26 DIAGNOSIS — G4700 Insomnia, unspecified: Secondary | ICD-10-CM | POA: Insufficient documentation

## 2017-08-26 DIAGNOSIS — Z853 Personal history of malignant neoplasm of breast: Secondary | ICD-10-CM | POA: Insufficient documentation

## 2017-08-26 DIAGNOSIS — I480 Paroxysmal atrial fibrillation: Secondary | ICD-10-CM | POA: Insufficient documentation

## 2017-08-26 DIAGNOSIS — D649 Anemia, unspecified: Secondary | ICD-10-CM

## 2017-08-26 DIAGNOSIS — R531 Weakness: Secondary | ICD-10-CM | POA: Diagnosis not present

## 2017-08-26 DIAGNOSIS — F10129 Alcohol abuse with intoxication, unspecified: Secondary | ICD-10-CM | POA: Diagnosis not present

## 2017-08-26 DIAGNOSIS — E785 Hyperlipidemia, unspecified: Secondary | ICD-10-CM | POA: Insufficient documentation

## 2017-08-26 DIAGNOSIS — D62 Acute posthemorrhagic anemia: Secondary | ICD-10-CM | POA: Diagnosis not present

## 2017-08-26 DIAGNOSIS — Z9011 Acquired absence of right breast and nipple: Secondary | ICD-10-CM | POA: Insufficient documentation

## 2017-08-26 DIAGNOSIS — I5033 Acute on chronic diastolic (congestive) heart failure: Secondary | ICD-10-CM | POA: Diagnosis not present

## 2017-08-26 DIAGNOSIS — S0990XA Unspecified injury of head, initial encounter: Secondary | ICD-10-CM | POA: Diagnosis not present

## 2017-08-26 DIAGNOSIS — I130 Hypertensive heart and chronic kidney disease with heart failure and stage 1 through stage 4 chronic kidney disease, or unspecified chronic kidney disease: Secondary | ICD-10-CM | POA: Insufficient documentation

## 2017-08-26 DIAGNOSIS — I4819 Other persistent atrial fibrillation: Secondary | ICD-10-CM | POA: Diagnosis present

## 2017-08-26 DIAGNOSIS — E1122 Type 2 diabetes mellitus with diabetic chronic kidney disease: Secondary | ICD-10-CM | POA: Insufficient documentation

## 2017-08-26 DIAGNOSIS — K219 Gastro-esophageal reflux disease without esophagitis: Secondary | ICD-10-CM | POA: Diagnosis not present

## 2017-08-26 DIAGNOSIS — Z6835 Body mass index (BMI) 35.0-35.9, adult: Secondary | ICD-10-CM | POA: Diagnosis not present

## 2017-08-26 DIAGNOSIS — R58 Hemorrhage, not elsewhere classified: Secondary | ICD-10-CM | POA: Diagnosis not present

## 2017-08-26 DIAGNOSIS — S0191XA Laceration without foreign body of unspecified part of head, initial encounter: Secondary | ICD-10-CM | POA: Diagnosis present

## 2017-08-26 DIAGNOSIS — Z23 Encounter for immunization: Secondary | ICD-10-CM | POA: Diagnosis not present

## 2017-08-26 DIAGNOSIS — S0181XA Laceration without foreign body of other part of head, initial encounter: Secondary | ICD-10-CM | POA: Diagnosis not present

## 2017-08-26 DIAGNOSIS — I10 Essential (primary) hypertension: Secondary | ICD-10-CM | POA: Diagnosis present

## 2017-08-26 DIAGNOSIS — Z79899 Other long term (current) drug therapy: Secondary | ICD-10-CM | POA: Insufficient documentation

## 2017-08-26 DIAGNOSIS — Y904 Blood alcohol level of 80-99 mg/100 ml: Secondary | ICD-10-CM | POA: Diagnosis not present

## 2017-08-26 LAB — CBC
HEMATOCRIT: 35.8 % — AB (ref 36.0–46.0)
HEMOGLOBIN: 11.5 g/dL — AB (ref 12.0–15.0)
MCH: 28.9 pg (ref 26.0–34.0)
MCHC: 32.1 g/dL (ref 30.0–36.0)
MCV: 89.9 fL (ref 78.0–100.0)
Platelets: 202 10*3/uL (ref 150–400)
RBC: 3.98 MIL/uL (ref 3.87–5.11)
RDW: 14.9 % (ref 11.5–15.5)
WBC: 5.6 10*3/uL (ref 4.0–10.5)

## 2017-08-26 MED ORDER — LIDOCAINE-EPINEPHRINE (PF) 2 %-1:200000 IJ SOLN
10.0000 mL | Freq: Once | INTRAMUSCULAR | Status: AC
  Administered 2017-08-26: 10 mL via INTRADERMAL
  Filled 2017-08-26: qty 10
  Filled 2017-08-26: qty 20

## 2017-08-26 MED ORDER — LIDOCAINE HCL (PF) 1 % IJ SOLN
INTRAMUSCULAR | Status: AC
  Filled 2017-08-26: qty 30

## 2017-08-26 MED ORDER — TETANUS-DIPHTH-ACELL PERTUSSIS 5-2.5-18.5 LF-MCG/0.5 IM SUSP
0.5000 mL | Freq: Once | INTRAMUSCULAR | Status: AC
  Administered 2017-08-27: 0.5 mL via INTRAMUSCULAR
  Filled 2017-08-26: qty 0.5

## 2017-08-26 NOTE — ED Provider Notes (Addendum)
[facility] EMERGENCY DEPARTMENT Provider Note   CSN: [id number] Arrival date & time: 08/26/17  2130  History   Chief Complaint Chief Complaint  Patient presents with  . Fall   HPI Patient is a 77 y.o. female with history of atrial fibrillation on Eliquis, CKD, and DM presenting to the ED for fall with forehead laceration. Patient states she was walking at home when she tripped and fell, striking her forehead on a coffee table. She denies LOC and states she has been ambulatory since the fall. No neck pain, back pain, or extremity pain. No recent chest pain or palpitations. She denies other recent illness.  Past Medical History:  Diagnosis Date  . Atrial fibrillation ([place])   . Atrial fibrillation with RVR ([place]) 05/08/2017  . Breast cancer ([place])   . Cancer ([place])   . CKD (chronic kidney disease), stage III ([place]) 07/10/2014  . Diabetes mellitus without complication ([place])   . Dyspnea   . GERD (gastroesophageal reflux disease)   . Heart murmur   . Hyperlipemia   . Hypertension   . Wears glasses     Patient Active Problem List   Diagnosis Date Noted  . Persistent atrial fibrillation ([place])   . Abnormal nuclear stress test 05/12/2017  . Essential hypertension   . Hypertensive urgency   . Acute on chronic diastolic CHF (congestive heart failure) ([place]) 05/07/2017  . Non-insulin-dependent diabetes mellitus with renal complications ([place]) 93/79/0240  . Atrial fibrillation with RVR ([place]) 04/26/2016  . CKD (chronic kidney disease), stage III ([place]) 07/10/2014  . Insomnia w/ sleep apnea 09/12/2013  . Snoring 09/12/2013  . Severe obesity (BMI >= 40) ([place]) 09/12/2013  . Neuropathic pain 01/07/2013  . Malignant neoplasm of upper-outer quadrant of right breast in female, estrogen receptor positive ([place]) 11/05/2012  . Hyperlipidemia 04/24/2009  . HYPERTENSION, BENIGN 04/24/2009    Past Surgical History:  Procedure Laterality Date  . ABDOMINAL HYSTERECTOMY    . BACK  SURGERY  2002   lumbar lam  . CARDIAC CATHETERIZATION  2003  . CARDIOVERSION N/A 05/15/2017   Procedure: CARDIOVERSION;  Surgeon: Skeet Latch, MD;  Location: [facility];  Service: Cardiovascular;  Laterality: N/A;  . CARDIOVERSION N/A 06/13/2017   Procedure: CARDIOVERSION;  Surgeon: Josue Hector, MD;  Location: [facility] ENDOSCOPY;  Service: Cardiovascular;  Laterality: N/A;  . COLONOSCOPY    . LEFT HEART CATH AND CORONARY ANGIOGRAPHY N/A 05/12/2017   Procedure: LEFT HEART CATH AND CORONARY ANGIOGRAPHY;  Surgeon: Leonie Man, MD;  Location: [facility] CV LAB;  Service: Cardiovascular;  Laterality: N/A;  . SIMPLE MASTECTOMY WITH AXILLARY SENTINEL NODE BIOPSY Right 12/03/2012   Procedure: RIGHT SIMPLE MASTECTOMY WITH RIGHT  AXILLARY SENTINEL LYMPH NODE BIOPSY;  Surgeon: Shann Medal, MD;  Location: [facility];  Service: General;  Laterality: Right;  . TEE WITHOUT CARDIOVERSION N/A 05/15/2017   Procedure: TRANSESOPHAGEAL ECHOCARDIOGRAM (TEE);  Surgeon: Skeet Latch, MD;  Location: [facility];  Service: Cardiovascular;  Laterality: N/A;  . UPPER GI ENDOSCOPY       OB History   None      Home Medications    Prior to Admission medications   Medication Sig Start Date End Date Taking? Authorizing Provider  amiodarone (PACERONE) 200 MG tablet Take 1 tablet (200 mg total) by mouth daily. 08/03/17   Sherran Needs, NP  amLODipine (NORVASC) 10 MG tablet Take 1 tablet (10 mg total) by mouth daily. 07/20/17 07/20/18  Constance Haw, MD  apixaban (  ELIQUIS) 5 MG TABS tablet Take 1 tablet (5 mg total) by mouth 2 (two) times daily. 06/21/17   Sherran Needs, NP  carvedilol (COREG) 25 MG tablet Take 1 tablet (25 mg total) by mouth 2 (two) times daily. 05/16/17 08/15/17  DukeTami Lin, PA  furosemide (LASIX) 40 MG tablet Take 40 mg by mouth daily.     [provider]  gabapentin (NEURONTIN) 300 MG capsule Take 1 capsule (300 mg total) by mouth at  bedtime. Patient taking differently: Take 300 mg by mouth at bedtime as needed (pain).  02/20/17   Magrinat, Virgie Dad, MD  hydrALAZINE (APRESOLINE) 100 MG tablet Take 1 tablet (100 mg total) by mouth every 8 (eight) hours. Patient taking differently: Take 100 mg by mouth daily.  06/02/17   Almyra Deforest, PA  isosorbide dinitrate (ISORDIL) 10 MG tablet Take 10 mg by mouth daily.    [provider]  nitroGLYCERIN (NITROSTAT) 0.4 MG SL tablet Place 1 tablet (0.4 mg total) under the tongue every 5 (five) minutes as needed for chest pain. 05/16/17   Duke, Tami Lin, PA  omeprazole (PRILOSEC) 40 MG capsule Take 40 mg by mouth daily.     Buccini, Robert, MD  potassium chloride SA (K-DUR,KLOR-CON) 20 MEQ tablet Take 1 tablet (20 mEq total) by mouth daily. 06/14/17   Sherran Needs, NP  rosuvastatin (CRESTOR) 10 MG tablet Take 1 tablet (10 mg total) by mouth daily. 07/20/17   Camnitz, Ocie Doyne, MD    Family History Family History  Problem Relation Age of Onset  . Hypertension Mother   . Stroke Mother   . Hypertension Father   . Heart attack Father   . Hypertension Paternal Grandmother     Social History Social History   Tobacco Use  . Smoking status: Former Smoker    Packs/day: 0.30    Years: 30.00    Pack years: 9.00    Types: Cigarettes    Last attempt to quit: 11/26/1988    Years since quitting: 28.7  . Smokeless tobacco: Never Used  Substance Use Topics  . Alcohol use: Yes    Alcohol/week: 2.4 oz    Types: 4 Glasses of wine per week    Comment: occ  . Drug use: No     Allergies   Patient has no known allergies.   Review of Systems Review of Systems  Constitutional: Negative for fever.  HENT: Negative for congestion and sore throat.   Eyes: Negative for visual disturbance.  Respiratory: Negative for shortness of breath.   Cardiovascular: Negative for chest pain.  Gastrointestinal: Negative for abdominal pain, diarrhea and vomiting.  Genitourinary: Negative for  dysuria.  Musculoskeletal: Negative for neck pain.  Neurological: Negative for syncope.  All other systems reviewed and are negative.    Physical Exam Updated Vital Signs BP (!) 172/100 (BP Location: Right Arm)   Pulse 79   Temp 97.9 F (36.6 C) (Oral)   Resp 16   Ht 5\' 9"  (1.753 m)   Wt 105.2 kg (232 lb)   SpO2 98%   BMI 34.26 kg/m   Physical Exam  Constitutional: She is oriented to person, place, and time. No distress.  HENT:  Head: Normocephalic.  Mouth/Throat: Oropharynx is clear and moist.  4 cm linear laceration over the left upper forehead. No facial bone tenderness or deformity  Eyes: Pupils are equal, round, and reactive to light. Conjunctivae and EOM are normal.  Neck: Neck supple. No spinous process tenderness and  no muscular tenderness present. No tracheal deviation and normal range of motion present.  Cardiovascular: Normal rate, regular rhythm, normal heart sounds and intact distal pulses.  No murmur heard. Pulmonary/Chest: Effort normal and breath sounds normal. No stridor. No respiratory distress. She has no wheezes. She has no rales. She exhibits no tenderness.  Abdominal: Soft. She exhibits no distension and no mass. There is no tenderness. There is no guarding.  Musculoskeletal: She exhibits no edema or deformity.  Full painless ROM of all extremities without deformity. No tenderness along T or L spine.  Neurological: She is alert and oriented to person, place, and time.  Skin: Skin is warm and dry.  Psychiatric: She has a normal mood and affect. Her behavior is normal.  Nursing note and vitals reviewed.    ED Treatments / Results  Labs (all labs ordered are listed, but only abnormal results are displayed) Labs Reviewed - No data to display  EKG None  Radiology No results found.  Procedures .Marland KitchenLaceration Repair Date/Time: 08/19/2017 10:30 PM Performed by: Prescilla Sours, MD Authorized by: Margette Fast, MD   Laceration details:     Location: forehead.   Length (cm):  5 Repair type:    Repair type:  Simple Skin repair:    Repair method:  Staples   Number of staples:  5 Approximation:    Approximation:  Close   (including critical care time)  Medications Ordered in ED Medications - No data to display   Initial Impression / Assessment and Plan / ED Course  I have reviewed the triage vital signs and the nursing notes.  Pertinent labs & imaging results that were available during my care of the patient were reviewed by me and considered in my medical decision making (see chart for details).  Patient on anticoagulation presenting for mechanical fall with forehead laceration as above.  On initial evaluation, patient resting comfortably and laceration was largely hemostatic with mild oozing. Prior to CT completion, I was notified that patient was significantly bleeding. On re-evaluation, she had brisk bleeding from forehead with active venous output. Moderate amount of blood lost. I injected lidocaine with epinephrine and applied pressure to help control bleeding without improvement. I then attempted to oversew the bleeding vessel using figure of 8 suture but was unable to visualize the tissue due to her brisk bleeding. During this time, she became somnolent and was not following commands. I was concerned for ICH or blood loss as cause of mental status change and therefore emergently stapled the wound for hemostasis with good control.   CT head and C-spine showed no intracranial or spine injury. Initial CBC showed hemoglobin near baseline. HDS. Mental status normalized on recheck. She later disclosed that she has been drinking alcohol this evening, likely contributing to her somnolence during wound repair. Although staples are not preferable for forehead lacerations, her wound showed excellent alignment and approximation. Do not feel revision with sutures warranted.  Given anticoagulation and significant blood loss, will  recheck hemoglobin in 3 hours and ensure ambulation prior to discharge. Care handed off to Dr. Randal Buba at about [place]. Please see her note for remainder of care and ultimate disposition.  Final Clinical Impressions(s) / ED Diagnoses   Final diagnoses:  Laceration of forehead, initial encounter      Prescilla Sours, MD 08/27/17 [id number]    Margette Fast, MD 08/27/17 1013

## 2017-08-26 NOTE — ED Triage Notes (Signed)
Pt BIB EMS from home. Suffered a mechanical fall this evening, striking her head on her coffee table, without LOC. Presents with 2in lac to L forehead, still hemorrhaging upon arrival in ED. Pt on eliquis. Head re-bandaged with abd pad & kerlix. Pt A&Ox4.

## 2017-08-27 ENCOUNTER — Emergency Department (HOSPITAL_COMMUNITY): Payer: Medicare HMO

## 2017-08-27 ENCOUNTER — Encounter (HOSPITAL_COMMUNITY): Payer: Self-pay | Admitting: Family Medicine

## 2017-08-27 DIAGNOSIS — R9431 Abnormal electrocardiogram [ECG] [EKG]: Secondary | ICD-10-CM

## 2017-08-27 DIAGNOSIS — N183 Chronic kidney disease, stage 3 (moderate): Secondary | ICD-10-CM

## 2017-08-27 DIAGNOSIS — I10 Essential (primary) hypertension: Secondary | ICD-10-CM | POA: Diagnosis not present

## 2017-08-27 DIAGNOSIS — I481 Persistent atrial fibrillation: Secondary | ICD-10-CM

## 2017-08-27 DIAGNOSIS — E876 Hypokalemia: Secondary | ICD-10-CM | POA: Diagnosis not present

## 2017-08-27 DIAGNOSIS — D62 Acute posthemorrhagic anemia: Secondary | ICD-10-CM | POA: Diagnosis not present

## 2017-08-27 DIAGNOSIS — S0101XA Laceration without foreign body of scalp, initial encounter: Secondary | ICD-10-CM | POA: Diagnosis not present

## 2017-08-27 DIAGNOSIS — S0181XA Laceration without foreign body of other part of head, initial encounter: Secondary | ICD-10-CM | POA: Diagnosis not present

## 2017-08-27 DIAGNOSIS — R55 Syncope and collapse: Secondary | ICD-10-CM

## 2017-08-27 DIAGNOSIS — I480 Paroxysmal atrial fibrillation: Secondary | ICD-10-CM

## 2017-08-27 DIAGNOSIS — S0191XA Laceration without foreign body of unspecified part of head, initial encounter: Secondary | ICD-10-CM

## 2017-08-27 DIAGNOSIS — R918 Other nonspecific abnormal finding of lung field: Secondary | ICD-10-CM | POA: Diagnosis not present

## 2017-08-27 LAB — TYPE AND SCREEN
ABO/RH(D): A POS
Antibody Screen: NEGATIVE

## 2017-08-27 LAB — CBC
HCT: 29.2 % — ABNORMAL LOW (ref 36.0–46.0)
Hemoglobin: 9.5 g/dL — ABNORMAL LOW (ref 12.0–15.0)
MCH: 29.6 pg (ref 26.0–34.0)
MCHC: 32.5 g/dL (ref 30.0–36.0)
MCV: 91 fL (ref 78.0–100.0)
Platelets: 213 10*3/uL (ref 150–400)
RBC: 3.21 MIL/uL — ABNORMAL LOW (ref 3.87–5.11)
RDW: 15.1 % (ref 11.5–15.5)
WBC: 7.4 10*3/uL (ref 4.0–10.5)

## 2017-08-27 LAB — BASIC METABOLIC PANEL
ANION GAP: 13 (ref 5–15)
BUN: 17 mg/dL (ref 8–23)
CALCIUM: 8.4 mg/dL — AB (ref 8.9–10.3)
CHLORIDE: 103 mmol/L (ref 98–111)
CO2: 21 mmol/L — AB (ref 22–32)
CREATININE: 1.63 mg/dL — AB (ref 0.44–1.00)
GFR calc non Af Amer: 30 mL/min — ABNORMAL LOW (ref >60)
GFR, EST AFRICAN AMERICAN: 34 mL/min — AB (ref >60)
Glucose, Bld: 158 mg/dL — ABNORMAL HIGH (ref 70–99)
Potassium: 3.2 mmol/L — ABNORMAL LOW (ref 3.5–5.1)
SODIUM: 137 mmol/L (ref 135–145)

## 2017-08-27 LAB — HEMOGLOBIN: HEMOGLOBIN: 9.3 g/dL — AB (ref 12.0–15.0)

## 2017-08-27 LAB — I-STAT CHEM 8, ED
BUN: 20 mg/dL (ref 8–23)
CHLORIDE: 101 mmol/L (ref 98–111)
Calcium, Ion: 1.1 mmol/L — ABNORMAL LOW (ref 1.15–1.40)
Creatinine, Ser: 1.8 mg/dL — ABNORMAL HIGH (ref 0.44–1.00)
Glucose, Bld: 153 mg/dL — ABNORMAL HIGH (ref 70–99)
HCT: 30 % — ABNORMAL LOW (ref 36.0–46.0)
HEMOGLOBIN: 10.2 g/dL — AB (ref 12.0–15.0)
POTASSIUM: 3.1 mmol/L — AB (ref 3.5–5.1)
SODIUM: 137 mmol/L (ref 135–145)
TCO2: 21 mmol/L — ABNORMAL LOW (ref 22–32)

## 2017-08-27 LAB — PROTIME-INR
INR: 1.41
Prothrombin Time: 17.2 seconds — ABNORMAL HIGH (ref 11.4–15.2)

## 2017-08-27 LAB — HEMATOCRIT: HEMATOCRIT: 28.2 % — AB (ref 36.0–46.0)

## 2017-08-27 LAB — ABO/RH: ABO/RH(D): A POS

## 2017-08-27 LAB — ETHANOL: ALCOHOL ETHYL (B): 86 mg/dL — AB (ref <10)

## 2017-08-27 MED ORDER — SODIUM CHLORIDE 0.9% FLUSH: 3.0000 mL | Freq: Two times a day (BID) | INTRAVENOUS | Status: DC

## 2017-08-27 MED ORDER — SODIUM CHLORIDE 0.9 % IV BOLUS
500.0000 mL | Freq: Once | INTRAVENOUS | Status: AC
  Administered 2017-08-27: 500 mL via INTRAVENOUS

## 2017-08-27 MED ORDER — SODIUM CHLORIDE 0.9 % IV SOLN: 250.0000 mL | INTRAVENOUS | Status: DC | PRN

## 2017-08-27 MED ORDER — ACETAMINOPHEN 325 MG PO TABS: 650.0000 mg | ORAL_TABLET | Freq: Four times a day (QID) | ORAL | Status: DC | PRN

## 2017-08-27 MED ORDER — MAGNESIUM SULFATE 2 GM/50ML IV SOLN
2.0000 g | Freq: Once | INTRAVENOUS | Status: AC
  Administered 2017-08-27: 2 g via INTRAVENOUS
  Filled 2017-08-27: qty 50

## 2017-08-27 MED ORDER — SODIUM CHLORIDE 0.9 % IV BOLUS: 1000.0000 mL | Freq: Once | INTRAVENOUS | Status: DC

## 2017-08-27 MED ORDER — FUROSEMIDE 40 MG PO TABS
40.0000 mg | ORAL_TABLET | Freq: Every day | ORAL | Status: DC
  Administered 2017-08-27: 40 mg via ORAL
  Filled 2017-08-27: qty 1

## 2017-08-27 MED ORDER — POTASSIUM CHLORIDE 10 MEQ/100ML IV SOLN
10.0000 meq | INTRAVENOUS | Status: AC
  Administered 2017-08-27 (×3): 10 meq via INTRAVENOUS
  Filled 2017-08-27 (×3): qty 100

## 2017-08-27 MED ORDER — POTASSIUM CHLORIDE CRYS ER 20 MEQ PO TBCR
20.0000 meq | EXTENDED_RELEASE_TABLET | Freq: Every day | ORAL | Status: DC
  Administered 2017-08-27: 20 meq via ORAL
  Filled 2017-08-27: qty 1

## 2017-08-27 MED ORDER — SENNOSIDES-DOCUSATE SODIUM 8.6-50 MG PO TABS: 1.0000 | ORAL_TABLET | Freq: Every evening | ORAL | Status: DC | PRN

## 2017-08-27 MED ORDER — HYDROCODONE-ACETAMINOPHEN 5-325 MG PO TABS: 1.0000 | ORAL_TABLET | ORAL | Status: DC | PRN

## 2017-08-27 MED ORDER — SODIUM CHLORIDE 0.9 % IV SOLN: 500.0000 mg | Freq: Once | INTRAVENOUS | Status: DC

## 2017-08-27 MED ORDER — SODIUM CHLORIDE 0.9% FLUSH: 3.0000 mL | INTRAVENOUS | Status: DC | PRN

## 2017-08-27 MED ORDER — ROSUVASTATIN CALCIUM 10 MG PO TABS: 10.0000 mg | ORAL_TABLET | Freq: Every day | ORAL | Status: DC

## 2017-08-27 MED ORDER — ONDANSETRON HCL 4 MG/2ML IJ SOLN
4.0000 mg | Freq: Once | INTRAMUSCULAR | Status: AC
  Administered 2017-08-27: 4 mg via INTRAVENOUS
  Filled 2017-08-27: qty 2

## 2017-08-27 MED ORDER — CEFTRIAXONE SODIUM 1 G IJ SOLR
1.0000 g | Freq: Once | INTRAMUSCULAR | Status: AC
  Administered 2017-08-27: 1 g via INTRAVENOUS
  Filled 2017-08-27: qty 10

## 2017-08-27 MED ORDER — ACETAMINOPHEN 650 MG RE SUPP: 650.0000 mg | Freq: Four times a day (QID) | RECTAL | Status: DC | PRN

## 2017-08-27 MED ORDER — GABAPENTIN 300 MG PO CAPS: 300.0000 mg | ORAL_CAPSULE | Freq: Every evening | ORAL | Status: DC | PRN

## 2017-08-27 NOTE — ED Provider Notes (Signed)
Called as patient got up to use bathroom and had syncope.  Did not go to ground, was assisted to chair.  Loss of control of bladder, no seizure activity.    PERRL MMM RRR diminshed BS B NABS, soft non tender 2+ dtrs throughout Sleep but responds to verbal stimuli  Results for orders placed or performed during the hospital encounter of 08/26/17  CBC  Result Value Ref Range   WBC 5.6 4.0 - 10.5 K/uL   RBC 3.98 3.87 - 5.11 MIL/uL   Hemoglobin 11.5 (L) 12.0 - 15.0 g/dL   HCT 35.8 (L) 36.0 - 46.0 %   MCV 89.9 78.0 - 100.0 fL   MCH 28.9 26.0 - 34.0 pg   MCHC 32.1 30.0 - 36.0 g/dL   RDW 14.9 11.5 - 15.5 %   Platelets 202 150 - 400 K/uL  CBC  Result Value Ref Range   WBC 7.4 4.0 - 10.5 K/uL   RBC 3.21 (L) 3.87 - 5.11 MIL/uL   Hemoglobin 9.5 (L) 12.0 - 15.0 g/dL   HCT 29.2 (L) 36.0 - 46.0 %   MCV 91.0 78.0 - 100.0 fL   MCH 29.6 26.0 - 34.0 pg   MCHC 32.5 30.0 - 36.0 g/dL   RDW 15.1 11.5 - 15.5 %   Platelets 213 150 - 400 K/uL  Protime-INR  Result Value Ref Range   Prothrombin Time 17.2 (H) 11.4 - 15.2 seconds   INR 9.14   Basic metabolic panel  Result Value Ref Range   Sodium 137 135 - 145 mmol/L   Potassium 3.2 (L) 3.5 - 5.1 mmol/L   Chloride 103 98 - 111 mmol/L   CO2 21 (L) 22 - 32 mmol/L   Glucose, Bld 158 (H) 70 - 99 mg/dL   BUN 17 8 - 23 mg/dL   Creatinine, Ser 1.63 (H) 0.44 - 1.00 mg/dL   Calcium 8.4 (L) 8.9 - 10.3 mg/dL   GFR calc non Af Amer 30 (L) >60 mL/min   GFR calc Af Amer 34 (L) >60 mL/min   Anion gap 13 5 - 15  I-Stat Chem 8, ED  Result Value Ref Range   Sodium 137 135 - 145 mmol/L   Potassium 3.1 (L) 3.5 - 5.1 mmol/L   Chloride 101 98 - 111 mmol/L   BUN 20 8 - 23 mg/dL   Creatinine, Ser 1.80 (H) 0.44 - 1.00 mg/dL   Glucose, Bld 153 (H) 70 - 99 mg/dL   Calcium, Ion 1.10 (L) 1.15 - 1.40 mmol/L   TCO2 21 (L) 22 - 32 mmol/L   Hemoglobin 10.2 (L) 12.0 - 15.0 g/dL   HCT 30.0 (L) 36.0 - 46.0 %  Type and screen  Result Value Ref Range   ABO/RH(D) A POS     Antibody Screen NEG    Sample Expiration      08/30/2017 Performed at [facility], [address], [place], [place] [address]   ABO/Rh  Result Value Ref Range   ABO/RH(D)      A POS Performed at [facility] [address], [place], [place] [address]    Dg Chest 2 View  Result Date: 08/27/2017 CLINICAL DATA:  Initial evaluation for acute trauma, fall. Shortness of breath. EXAM: CHEST - 2 VIEW COMPARISON:  Prior radiograph from 05/13/2017 FINDINGS: Cardiomegaly, stable.  Mediastinal silhouette within normal limits. Lungs normally inflated. Perihilar vascular congestion without overt pulmonary edema. No pleural effusion. Streaky left basilar opacity felt to be most consistent  with atelectasis. No other focal airspace disease. No pneumothorax. No acute osseous abnormality. IMPRESSION: 1. Cardiomegaly with perihilar vascular congestion without overt pulmonary edema. 2. Streaky opacity within the retrocardiac left lower lobe, favored to reflect atelectasis. Possible infiltrate could be considered in the correct clinical setting. Electronically Signed   By: Jeannine Boga M.D.   On: 08/27/2017 04:55   Ct Head Wo Contrast  Result Date: 08/26/2017 CLINICAL DATA:  77 year old female with head trauma. EXAM: CT HEAD WITHOUT CONTRAST CT CERVICAL SPINE WITHOUT CONTRAST TECHNIQUE: Multidetector CT imaging of the head and cervical spine was performed following the standard protocol without intravenous contrast. Multiplanar CT image reconstructions of the cervical spine were also generated. COMPARISON:  Head CT dated 05/22/2003 FINDINGS: Evaluation of this exam is limited due to motion artifact. CT HEAD FINDINGS Brain: There is mild age-related atrophy and chronic microvascular ischemic changes. No definite acute intracranial hemorrhage identified. No mass effect or midline shift. No extra-axial fluid collection. Vascular: Limited evaluation due to motion artifact. Skull: Normal. Negative  for fracture or focal lesion. Sinuses/Orbits: There is mucoperiosteal thickening of paranasal sinuses. No air-fluid level. The mastoid air cells are clear. Other: Left forehead contusion and cutaneous surgical clips. CT CERVICAL SPINE FINDINGS Alignment: No acute subluxation. Skull base and vertebrae: No acute fracture. Soft tissues and spinal canal: No prevertebral fluid or swelling. No visible canal hematoma. Disc levels:  Multilevel degenerative changes with osteophyte. Upper chest: Negative. Other: None IMPRESSION: 1. No definite acute intracranial hemorrhage on this motion degraded exam. 2. No acute/traumatic cervical spine pathology. Electronically Signed   By: Anner Crete M.D.   On: 08/26/2017 23:39   Ct Cervical Spine Wo Contrast  Result Date: 08/26/2017 CLINICAL DATA:  77 year old female with head trauma. EXAM: CT HEAD WITHOUT CONTRAST CT CERVICAL SPINE WITHOUT CONTRAST TECHNIQUE: Multidetector CT imaging of the head and cervical spine was performed following the standard protocol without intravenous contrast. Multiplanar CT image reconstructions of the cervical spine were also generated. COMPARISON:  Head CT dated 05/22/2003 FINDINGS: Evaluation of this exam is limited due to motion artifact. CT HEAD FINDINGS Brain: There is mild age-related atrophy and chronic microvascular ischemic changes. No definite acute intracranial hemorrhage identified. No mass effect or midline shift. No extra-axial fluid collection. Vascular: Limited evaluation due to motion artifact. Skull: Normal. Negative for fracture or focal lesion. Sinuses/Orbits: There is mucoperiosteal thickening of paranasal sinuses. No air-fluid level. The mastoid air cells are clear. Other: Left forehead contusion and cutaneous surgical clips. CT CERVICAL SPINE FINDINGS Alignment: No acute subluxation. Skull base and vertebrae: No acute fracture. Soft tissues and spinal canal: No prevertebral fluid or swelling. No visible canal hematoma. Disc  levels:  Multilevel degenerative changes with osteophyte. Upper chest: Negative. Other: None IMPRESSION: 1. No definite acute intracranial hemorrhage on this motion degraded exam. 2. No acute/traumatic cervical spine pathology. Electronically Signed   By: Anner Crete M.D.   On: 08/26/2017 23:39    AMS:  Question aspiration,  Marked decrease in hemoglobin secondary to bleeding from head wound on eliquis    Romel Dumond, MD 08/27/17 [id number]

## 2017-08-27 NOTE — ED Notes (Signed)
ED Provider at bedside. 

## 2017-08-27 NOTE — H&P (Signed)
History and Physical    Carrie Key NWG:[id number] DOB: 19-Jul-1940 DOA: 08/26/2017  PCP: Glendale Chard, MD   Patient coming from: Home   Chief Complaint: Fall with scalp laceration   HPI: Carrie Key is a 77 y.o. female with medical history significant for chronic diastolic CHF, paroxysmal atrial fibrillation on Eliquis, chronic kidney disease stage III, and hypertension, now presenting to the emergency department with a scalp laceration after a fall at home.  Patient reports that she had been in her usual state of health, was having an uneventful day, drank some alcohol which she does not typically do, and then tripped on a rug, striking her head on the edge of a table.  She developed a laceration to her scalp with profuse bleeding and called EMS.  She denies any chest pain, shortness of breath, recent fevers or chills, melena, or hematochezia.  She denies daily drinking and denies history of alcohol withdrawal.  ED Course: Upon arrival to the ED, patient is found to be afebrile, saturating well on room air, and with vitals otherwise normal.  EKG features a sinus rhythm with PACs and QTc interval 527 ms.  Noncontrast head CT is negative for acute intracranial abnormality and cervical spine CT is negative for acute fracture.  Chest x-ray is notable for cardiomegaly with pulmonary vascular congestion and a retrocardiac opacity likely reflecting atelectasis.  Chemistry panel is notable for a potassium 3.2 and creatinine 1.63, up from 1.38 last month.  CBC was notable for slight normocytic anemia with hemoglobin of 11.5.  CBC was repeated a few hours later and hemoglobin is fallen to 9.5.  Type and screen was performed in the ED, Tdap was updated, laceration was closed with sutures, and the patient was given 500 cc normal saline.  She got up to ambulate to the bathroom, became acutely lightheaded, was eased into a chair by her RN, and had a brief loss of consciousness.  She has  recovered fully from the syncopal episode and will be admitted for ongoing evaluation and management.  Review of Systems:  All other systems reviewed and apart from HPI, are negative.  Past Medical History:  Diagnosis Date  . Atrial fibrillation ([place])   . Atrial fibrillation with RVR ([place]) 05/08/2017  . Breast cancer ([place])   . Cancer ([place])   . CKD (chronic kidney disease), stage III ([place]) 07/10/2014  . Diabetes mellitus without complication ([place])   . Dyspnea   . GERD (gastroesophageal reflux disease)   . Heart murmur   . Hyperlipemia   . Hypertension   . Wears glasses     Past Surgical History:  Procedure Laterality Date  . ABDOMINAL HYSTERECTOMY    . BACK SURGERY  2002   lumbar lam  . CARDIAC CATHETERIZATION  2003  . CARDIOVERSION N/A 05/15/2017   Procedure: CARDIOVERSION;  Surgeon: Skeet Latch, MD;  Location: [facility];  Service: Cardiovascular;  Laterality: N/A;  . CARDIOVERSION N/A 06/13/2017   Procedure: CARDIOVERSION;  Surgeon: Josue Hector, MD;  Location[facility] ENDOSCOPY;  Service: Cardiovascular;  Laterality: N/A;  . COLONOSCOPY    . LEFT HEART CATH AND CORONARY ANGIOGRAPHY N/A 05/12/2017   Procedure: LEFT HEART CATH AND CORONARY ANGIOGRAPHY;  Surgeon: Leonie Man, MD;  Location: [facility] CV LAB;  Service: Cardiovascular;  Laterality: N/A;  . SIMPLE MASTECTOMY WITH AXILLARY SENTINEL NODE BIOPSY Right 12/03/2012   Procedure: RIGHT SIMPLE MASTECTOMY WITH RIGHT  AXILLARY SENTINEL LYMPH NODE BIOPSY;  Surgeon: Shann Medal, MD;  Location: [facility];  Service: General;  Laterality: Right;  . TEE WITHOUT CARDIOVERSION N/A 05/15/2017   Procedure: TRANSESOPHAGEAL ECHOCARDIOGRAM (TEE);  Surgeon: Skeet Latch, MD;  Location: [facility];  Service: Cardiovascular;  Laterality: N/A;  . UPPER GI ENDOSCOPY       reports that she quit smoking about 28 years ago. Her smoking use included cigarettes. She has a 9.00 pack-year smoking history. She has never  used smokeless tobacco. She reports that she drinks about 2.4 oz of alcohol per week. She reports that she does not use drugs.  No Known Allergies  Family History  Problem Relation Age of Onset  . Hypertension Mother   . Stroke Mother   . Hypertension Father   . Heart attack Father   . Hypertension Paternal Grandmother      Prior to Admission medications   Medication Sig Start Date End Date Taking? Authorizing Provider  amiodarone (PACERONE) 200 MG tablet Take 1 tablet (200 mg total) by mouth daily. 08/03/17   Sherran Needs, NP  amLODipine (NORVASC) 10 MG tablet Take 1 tablet (10 mg total) by mouth daily. 07/20/17 07/20/18  Camnitz, Ocie Doyne, MD  apixaban (ELIQUIS) 5 MG TABS tablet Take 1 tablet (5 mg total) by mouth 2 (two) times daily. 06/21/17   Sherran Needs, NP  carvedilol (COREG) 25 MG tablet Take 1 tablet (25 mg total) by mouth 2 (two) times daily. 05/16/17 08/15/17  DukeTami Lin, PA  furosemide (LASIX) 40 MG tablet Take 40 mg by mouth daily.     [provider]  gabapentin (NEURONTIN) 300 MG capsule Take 1 capsule (300 mg total) by mouth at bedtime. Patient taking differently: Take 300 mg by mouth at bedtime as needed (pain).  02/20/17   Magrinat, Virgie Dad, MD  hydrALAZINE (APRESOLINE) 100 MG tablet Take 1 tablet (100 mg total) by mouth every 8 (eight) hours. Patient taking differently: Take 100 mg by mouth daily.  06/02/17   Almyra Deforest, PA  isosorbide dinitrate (ISORDIL) 10 MG tablet Take 10 mg by mouth daily.    [provider]  nitroGLYCERIN (NITROSTAT) 0.4 MG SL tablet Place 1 tablet (0.4 mg total) under the tongue every 5 (five) minutes as needed for chest pain. 05/16/17   Duke, Tami Lin, PA  omeprazole (PRILOSEC) 40 MG capsule Take 40 mg by mouth daily.     Buccini, Robert, MD  potassium chloride SA (K-DUR,KLOR-CON) 20 MEQ tablet Take 1 tablet (20 mEq total) by mouth daily. 06/14/17   Sherran Needs, NP  rosuvastatin (CRESTOR) 10 MG tablet Take 1  tablet (10 mg total) by mouth daily. 07/20/17   Constance Haw, MD    Physical Exam: Vitals:   08/27/17 0330 08/27/17 0454 08/27/17 0500 08/27/17 0515  BP: 133/82 (!) 153/86 138/77   Pulse:  88 84 87  Resp: 18 16 (!) 23 (!) 21  Temp:      TempSrc:      SpO2:  100% 99% 99%  Weight:      Height:          Constitutional: NAD, calm  Eyes: PERTLA, lids and conjunctivae normal ENMT: Mucous membranes are moist. Posterior pharynx clear of any exudate or lesions.   Neck: normal, supple, no masses, no thyromegaly Respiratory: clear to auscultation bilaterally, no wheezing, no crackles. Normal respiratory effort.   Cardiovascular: S1 & S2 heard, regular rate and rhythm. No extremity edema.   Abdomen: No distension, no tenderness, soft. Bowel sounds normal.  Musculoskeletal: no clubbing / cyanosis. No joint deformity upper and lower extremities.   Skin: Left frontal scalp laceration closed with sutures. Warm, dry, well-perfused. Neurologic: CN 2-12 grossly intact. Sensation intact. Strength 5/5 in all 4 limbs.  Psychiatric: Alert and oriented x 3. Calm, cooperative.     Labs on Admission: I have personally reviewed following labs and imaging studies  CBC: Recent Labs  Lab 08/26/17 2212 08/27/17 0226 08/27/17 0238  WBC 5.6 7.4  --   HGB 11.5* 9.5* 10.2*  HCT 35.8* 29.2* 30.0*  MCV 89.9 91.0  --   PLT 202 213  --    Basic Metabolic Panel: Recent Labs  Lab 08/27/17 0226 08/27/17 0238  NA 137 137  K 3.2* 3.1*  CL 103 101  CO2 21*  --   GLUCOSE 158* 153*  BUN 17 20  CREATININE 1.63* 1.80*  CALCIUM 8.4*  --    GFR: Estimated Creatinine Clearance: 34.9 mL/min (A) (by C-G formula based on SCr of 1.8 mg/dL (H)). Liver Function Tests: No results for input(s): AST, ALT, ALKPHOS, BILITOT, PROT, ALBUMIN in the last 168 hours. No results for input(s): LIPASE, AMYLASE in the last 168 hours. No results for input(s): AMMONIA in the last 168 hours. Coagulation  Profile: Recent Labs  Lab 08/27/17 0226  INR 1.41   Cardiac Enzymes: No results for input(s): CKTOTAL, CKMB, CKMBINDEX, TROPONINI in the last 168 hours. BNP (last 3 results) Recent Labs    05/29/17 1534  PROBNP 2,966*   HbA1C: No results for input(s): HGBA1C in the last 72 hours. CBG: No results for input(s): GLUCAP in the last 168 hours. Lipid Profile: No results for input(s): CHOL, HDL, LDLCALC, TRIG, CHOLHDL, LDLDIRECT in the last 72 hours. Thyroid Function Tests: No results for input(s): TSH, T4TOTAL, FREET4, T3FREE, THYROIDAB in the last 72 hours. Anemia Panel: No results for input(s): VITAMINB12, FOLATE, FERRITIN, TIBC, IRON, RETICCTPCT in the last 72 hours. Urine analysis:    Component Value Date/Time   COLORURINE YELLOW 06/21/2014 0946   APPEARANCEUR CLEAR 06/21/2014 0946   LABSPEC 1.020 02/18/2016 1154   PHURINE 6.0 02/18/2016 1154   PHURINE 5.0 06/21/2014 0946   GLUCOSEU Negative 02/18/2016 1154   HGBUR Negative 02/18/2016 1154   HGBUR NEGATIVE 06/21/2014 0946   BILIRUBINUR Negative 02/18/2016 1154   KETONESUR Negative 02/18/2016 1154   KETONESUR NEGATIVE 06/21/2014 0946   PROTEINUR Negative 02/18/2016 1154   PROTEINUR NEGATIVE 06/21/2014 0946   UROBILINOGEN 0.2 02/18/2016 1154   NITRITE Negative 02/18/2016 1154   NITRITE NEGATIVE 06/21/2014 0946   LEUKOCYTESUR Moderate 02/18/2016 1154   Sepsis Labs: @LABRCNTIP (procalcitonin:4,lacticidven:4) )No results found for this or any previous visit (from the past 240 hour(s)).   Radiological Exams on Admission: Dg Chest 2 View  Result Date: 08/27/2017 CLINICAL DATA:  Initial evaluation for acute trauma, fall. Shortness of breath. EXAM: CHEST - 2 VIEW COMPARISON:  Prior radiograph from 05/13/2017 FINDINGS: Cardiomegaly, stable.  Mediastinal silhouette within normal limits. Lungs normally inflated. Perihilar vascular congestion without overt pulmonary edema. No pleural effusion. Streaky left basilar opacity felt to  be most consistent with atelectasis. No other focal airspace disease. No pneumothorax. No acute osseous abnormality. IMPRESSION: 1. Cardiomegaly with perihilar vascular congestion without overt pulmonary edema. 2. Streaky opacity within the retrocardiac left lower lobe, favored to reflect atelectasis. Possible infiltrate could be considered in the correct clinical setting. Electronically Signed   By: Jeannine Boga M.D.   On: 08/27/2017 04:55   Ct Head Wo Contrast  Result Date: 08/26/2017  CLINICAL DATA:  77 year old female with head trauma. EXAM: CT HEAD WITHOUT CONTRAST CT CERVICAL SPINE WITHOUT CONTRAST TECHNIQUE: Multidetector CT imaging of the head and cervical spine was performed following the standard protocol without intravenous contrast. Multiplanar CT image reconstructions of the cervical spine were also generated. COMPARISON:  Head CT dated 05/22/2003 FINDINGS: Evaluation of this exam is limited due to motion artifact. CT HEAD FINDINGS Brain: There is mild age-related atrophy and chronic microvascular ischemic changes. No definite acute intracranial hemorrhage identified. No mass effect or midline shift. No extra-axial fluid collection. Vascular: Limited evaluation due to motion artifact. Skull: Normal. Negative for fracture or focal lesion. Sinuses/Orbits: There is mucoperiosteal thickening of paranasal sinuses. No air-fluid level. The mastoid air cells are clear. Other: Left forehead contusion and cutaneous surgical clips. CT CERVICAL SPINE FINDINGS Alignment: No acute subluxation. Skull base and vertebrae: No acute fracture. Soft tissues and spinal canal: No prevertebral fluid or swelling. No visible canal hematoma. Disc levels:  Multilevel degenerative changes with osteophyte. Upper chest: Negative. Other: None IMPRESSION: 1. No definite acute intracranial hemorrhage on this motion degraded exam. 2. No acute/traumatic cervical spine pathology. Electronically Signed   By: Anner Crete M.D.    On: 08/26/2017 23:39   Ct Cervical Spine Wo Contrast  Result Date: 08/26/2017 CLINICAL DATA:  77 year old female with head trauma. EXAM: CT HEAD WITHOUT CONTRAST CT CERVICAL SPINE WITHOUT CONTRAST TECHNIQUE: Multidetector CT imaging of the head and cervical spine was performed following the standard protocol without intravenous contrast. Multiplanar CT image reconstructions of the cervical spine were also generated. COMPARISON:  Head CT dated 05/22/2003 FINDINGS: Evaluation of this exam is limited due to motion artifact. CT HEAD FINDINGS Brain: There is mild age-related atrophy and chronic microvascular ischemic changes. No definite acute intracranial hemorrhage identified. No mass effect or midline shift. No extra-axial fluid collection. Vascular: Limited evaluation due to motion artifact. Skull: Normal. Negative for fracture or focal lesion. Sinuses/Orbits: There is mucoperiosteal thickening of paranasal sinuses. No air-fluid level. The mastoid air cells are clear. Other: Left forehead contusion and cutaneous surgical clips. CT CERVICAL SPINE FINDINGS Alignment: No acute subluxation. Skull base and vertebrae: No acute fracture. Soft tissues and spinal canal: No prevertebral fluid or swelling. No visible canal hematoma. Disc levels:  Multilevel degenerative changes with osteophyte. Upper chest: Negative. Other: None IMPRESSION: 1. No definite acute intracranial hemorrhage on this motion degraded exam. 2. No acute/traumatic cervical spine pathology. Electronically Signed   By: Anner Crete M.D.   On: 08/26/2017 23:39    EKG: Independently reviewed. Sinus rhythm, PAC's, QTc 527 ms.   Assessment/Plan  1. Scalp laceration with acute blood-loss anemia - Presents with scalp laceration after hitting head on edge of table  - She is on Eliquis and reportedly had brisk bleeding before hemostasis achieved with staples in ED  - Hgb has dropped 2 g in ED, bleeding appears to be stopped  - Type and screen  was performed  - Hold Eliquis initially, repeat H&H in several hours    2. Syncope  - Pt had a syncopal episode in ED after getting up to ambulate to bathroom, eased into chair by RN without injury  - Negative orthostatic vitals  - Likely vasovagal in setting of scalp bleeding  - Continue cardiac monitoring    3. Paroxysmal atrial fibrillation  - In a sinus rhythm on admission  - CHADS-VASc is at least 77 (age x2, gender, CHF)  - Eliquis held initially given bleeding, amio held in light of  prolonged QTc    4. CKD stage III  - SCr is 1.63 on admission, up from 1.38 last month - Renally-dose medications, avoid nephrotoxins    5. Hypokalemia  - Serum potassium is 3.2 on admission with prolonged QTc - Continue oral supplement, give 30 mEq IV potassium and empiric mag   6. Prolonged QT interval  - QTc prolonged on admission EKG  - Replace potassium, continue cardiac monitoring, hold offending medications    7. Hypertension  - BP at goal  - Antihypertensives held initially while monitoring after acute blood-loss anemia and syncope  8. Chronic diastolic CHF  - Vascular congestion noted on CXR, but no apparent dyspnea or respiratory complaints  - Continue Lasix, follow daily wts   DVT prophylaxis: Eliquis pta, held on admission  Code Status: Full  Family Communication: Discussed with patient  Consults called: None Admission status: Observation     Vianne Bulls, MD [facility] Pager [phone]  If 7PM-7AM, please contact night-coverage [email or web address] Password [facility]  08/27/2017, 5:43 AM

## 2017-08-27 NOTE — ED Notes (Addendum)
Pt requesting from other RN to be able to go to bathroom. Pt A&O at the time. RN assisted pt from bed and ambulated into hallway before pt began to c/o sudden weakness. RN and NT assisted pt to seated position in chair. While seated, pt had LOC with bladder incontinence. Pulses present with spontaneous breathing. Little response with use of ammonia inhalant. Moved back to bed from chair, where she then woke up and became responsive with staff. MD notified.

## 2017-08-27 NOTE — ED Notes (Signed)
Pt given water at bedside. 

## 2017-08-27 NOTE — ED Notes (Signed)
Patient transported to X-ray 

## 2017-08-27 NOTE — ED Notes (Signed)
Patient transported to CT 

## 2017-08-27 NOTE — Discharge Summary (Signed)
Physician Discharge Summary  [name] FHL:[id number] DOB: 04-04-1940 DOA: 08/26/2017  PCP: Glendale Chard, MD  Admit date: 08/26/2017 Discharge date: 08/27/2017  Admitted From: Home  Disposition:  Home   Recommendations for Outpatient Follow-up:  1. Follow up with PCP in 1 wek for suture removal 2. Please obtain CBC in 2 months and ferritin   Home Health: None  Equipment/Devices: None  Discharge Condition: Good  CODE STATUS: FULL Diet recommendation: Regular  Brief/Interim Summary: Carrie Key is a 77 y.o. F with HTN, CKD III, pAF on Eliquis who presented with fall and head laceration.  The patient was in her usual state of health until the night of admission, had been drinking alcohol she normally does not do, when she was walking her through her living room, tripped on the edge of the rug, had a mechanical fall, and struck her head on the coffee table or the entertainment center.  She had a laceration and a lot of bleeding so she came to the emergency room.  In the emergency room, an initial attempt was made to close the laceration with sutures, but because of brisk bleeding from her Eliquis, the decision was made to use staples.  Subsequently, the patient was observed for a few hours, and repeat hemoglobin showed a slight drop from initial 11.5 g/dL to 9.5 g/dL.  Incidentally, she stood up around 2AM while still in the ER, and syncopized.  Per nursing report: "Pt requesting from other RN to be able to go to bathroom. Pt A&O at the time. RN assisted pt from bed and ambulated into hallway before pt began to c/o sudden weakness. RN and NT assisted pt to seated position in chair. While seated, pt had LOC with bladder incontinence. Pulses present with spontaneous breathing. Little response with use of ammonia inhalant. Moved back to bed from chair, where she then woke up and became responsive with staff. MD notified."      Discharge Diagnoses:   Scalp laceration with  acute blood-loss anemia This was stapled, staples will need to be removed on or around 7/27.  Mild blood loss.  Reasonable to repeat CBC in 2-3 months.     Syncope  The description is of a vagal event, provoked by bleeding from the forehead, intoxication, and fatigue/sleep deprivation. There was no arrhythmia. No further work up indicated.  Paroxysmal atrial fibrillation  In sinus rhythm.  Eliquis held for one day, restarted at discharge  CKD stage III  Given fluids.  Recheck with PCP  Hypokalemia  Supplemented  Prolonged QT interval   Hypertension  At goal  Chronic diastolic CHF  No active disease         Discharge Instructions  Discharge Instructions    Diet - low sodium heart healthy   Complete by:  As directed    Discharge instructions   Complete by:  As directed    From Dr. Loleta Books: You were admitted for observation after hitting your head. The cut on your head was bleeding fairly heavily until the emergency room personnel stapled it.    Please follow up with Dr. Baird Cancer in 5-7 days to have the staples removed. Clean the wound daily with soap and water, pat dry.  You may cover with a bandage.  You did lose some blood, and so you should have Dr. Baird Cancer check your blood levels (your "hemoglobin") in a month or two to ensure that you have gained it back.  It is safe to resume all your home medicines,  including Eliquis, as we discussed.   Increase activity slowly   Complete by:  As directed      Allergies as of 08/27/2017   No Known Allergies     Medication List    TAKE these medications   albuterol 108 (90 Base) MCG/ACT inhaler Commonly known as:  PROVENTIL HFA;VENTOLIN HFA Inhale 1-2 puffs into the lungs every 6 (six) hours as needed for wheezing or shortness of breath.   amiodarone 200 MG tablet Commonly known as:  PACERONE Take 1 tablet (200 mg total) by mouth daily.   amLODipine 10 MG tablet Commonly known as:  NORVASC Take 1 tablet  (10 mg total) by mouth daily.   apixaban 5 MG Tabs tablet Commonly known as:  ELIQUIS Take 1 tablet (5 mg total) by mouth 2 (two) times daily.   carvedilol 25 MG tablet Commonly known as:  COREG Take 1 tablet (25 mg total) by mouth 2 (two) times daily.   furosemide 40 MG tablet Commonly known as:  LASIX Take 40 mg by mouth daily.   gabapentin 300 MG capsule Commonly known as:  NEURONTIN Take 1 capsule (300 mg total) by mouth at bedtime. What changed:    when to take this  reasons to take this   hydrALAZINE 100 MG tablet Commonly known as:  APRESOLINE Take 1 tablet (100 mg total) by mouth every 8 (eight) hours. What changed:  when to take this   isosorbide dinitrate 10 MG tablet Commonly known as:  ISORDIL Take 10 mg by mouth daily.   nitroGLYCERIN 0.4 MG SL tablet Commonly known as:  NITROSTAT Place 1 tablet (0.4 mg total) under the tongue every 5 (five) minutes as needed for chest pain.   omeprazole 40 MG capsule Commonly known as:  PRILOSEC Take 40 mg by mouth daily.   potassium chloride SA 20 MEQ tablet Commonly known as:  K-DUR,KLOR-CON Take 1 tablet (20 mEq total) by mouth daily.   rosuvastatin 10 MG tablet Commonly known as:  CRESTOR Take 1 tablet (10 mg total) by mouth daily.      Follow-up Information    Glendale Chard, MD. Schedule an appointment as soon as possible for a visit in 5 days.   Specialty:  Internal Medicine Why:  For staple removal and wound re-check Contact information: [address] [place] [address] [phone]          No Known Allergies  Consultations:  None   Procedures/Studies: Dg Chest 2 View  Result Date: 08/27/2017 CLINICAL DATA:  Initial evaluation for acute trauma, fall. Shortness of breath. EXAM: CHEST - 2 VIEW COMPARISON:  Prior radiograph from 05/13/2017 FINDINGS: Cardiomegaly, stable.  Mediastinal silhouette within normal limits. Lungs normally inflated. Perihilar vascular congestion  without overt pulmonary edema. No pleural effusion. Streaky left basilar opacity felt to be most consistent with atelectasis. No other focal airspace disease. No pneumothorax. No acute osseous abnormality. IMPRESSION: 1. Cardiomegaly with perihilar vascular congestion without overt pulmonary edema. 2. Streaky opacity within the retrocardiac left lower lobe, favored to reflect atelectasis. Possible infiltrate could be considered in the correct clinical setting. Electronically Signed   By: Jeannine Boga M.D.   On: 08/27/2017 04:55   Ct Head Wo Contrast  Result Date: 08/26/2017 CLINICAL DATA:  77 year old female with head trauma. EXAM: CT HEAD WITHOUT CONTRAST CT CERVICAL SPINE WITHOUT CONTRAST TECHNIQUE: Multidetector CT imaging of the head and cervical spine was performed following the standard protocol without intravenous contrast. Multiplanar CT image reconstructions of the  cervical spine were also generated. COMPARISON:  Head CT dated 05/22/2003 FINDINGS: Evaluation of this exam is limited due to motion artifact. CT HEAD FINDINGS Brain: There is mild age-related atrophy and chronic microvascular ischemic changes. No definite acute intracranial hemorrhage identified. No mass effect or midline shift. No extra-axial fluid collection. Vascular: Limited evaluation due to motion artifact. Skull: Normal. Negative for fracture or focal lesion. Sinuses/Orbits: There is mucoperiosteal thickening of paranasal sinuses. No air-fluid level. The mastoid air cells are clear. Other: Left forehead contusion and cutaneous surgical clips. CT CERVICAL SPINE FINDINGS Alignment: No acute subluxation. Skull base and vertebrae: No acute fracture. Soft tissues and spinal canal: No prevertebral fluid or swelling. No visible canal hematoma. Disc levels:  Multilevel degenerative changes with osteophyte. Upper chest: Negative. Other: None IMPRESSION: 1. No definite acute intracranial hemorrhage on this motion degraded exam. 2. No  acute/traumatic cervical spine pathology. Electronically Signed   By: Anner Crete M.D.   On: 08/26/2017 23:39   Ct Cervical Spine Wo Contrast  Result Date: 08/26/2017 CLINICAL DATA:  77 year old female with head trauma. EXAM: CT HEAD WITHOUT CONTRAST CT CERVICAL SPINE WITHOUT CONTRAST TECHNIQUE: Multidetector CT imaging of the head and cervical spine was performed following the standard protocol without intravenous contrast. Multiplanar CT image reconstructions of the cervical spine were also generated. COMPARISON:  Head CT dated 05/22/2003 FINDINGS: Evaluation of this exam is limited due to motion artifact. CT HEAD FINDINGS Brain: There is mild age-related atrophy and chronic microvascular ischemic changes. No definite acute intracranial hemorrhage identified. No mass effect or midline shift. No extra-axial fluid collection. Vascular: Limited evaluation due to motion artifact. Skull: Normal. Negative for fracture or focal lesion. Sinuses/Orbits: There is mucoperiosteal thickening of paranasal sinuses. No air-fluid level. The mastoid air cells are clear. Other: Left forehead contusion and cutaneous surgical clips. CT CERVICAL SPINE FINDINGS Alignment: No acute subluxation. Skull base and vertebrae: No acute fracture. Soft tissues and spinal canal: No prevertebral fluid or swelling. No visible canal hematoma. Disc levels:  Multilevel degenerative changes with osteophyte. Upper chest: Negative. Other: None IMPRESSION: 1. No definite acute intracranial hemorrhage on this motion degraded exam. 2. No acute/traumatic cervical spine pathology. Electronically Signed   By: Anner Crete M.D.   On: 08/26/2017 23:39       Subjective: Feels well.  Good appetite.  No dizziness, confusion, weakness.  No focal wekness, slurred speech, numbness.  Discharge Exam: Vitals:   08/27/17 0640 08/27/17 1211  BP: (!) 142/76 (!) 182/86  Pulse: 88 77  Resp: 20 20  Temp: 98.4 F (36.9 C) 98.2 F (36.8 C)  SpO2:   98%   Vitals:   08/27/17 0600 08/27/17 0640 08/27/17 0641 08/27/17 1211  BP: (!) 145/84 (!) 142/76  (!) 182/86  Pulse: 85 88  77  Resp: 19 20  20   Temp:  98.4 F (36.9 C)  98.2 F (36.8 C)  TempSrc:  Oral  Oral  SpO2: 100%   98%  Weight:   103.6 kg (228 lb 6.3 oz)   Height:   5\' 7"  (1.702 m)     General: Pt is alert, awake, not in acute distress Cardiovascular: RRR, S1/S2 +, no rubs, no gallops Respiratory: CTA bilaterally, no wheezing, no rhonchi Abdominal: Soft, NT, ND, bowel sounds + Extremities: no edema, no cyanosis    The results of significant diagnostics from this hospitalization (including imaging, microbiology, ancillary and laboratory) are listed below for reference.     Microbiology: No results found for this or  any previous visit (from the past 240 hour(s)).   Labs: BNP (last 3 results) Recent Labs    05/07/17 2206  BNP 086.7*   Basic Metabolic Panel: Recent Labs  Lab 08/27/17 0226 08/27/17 0238  NA 137 137  K 3.2* 3.1*  CL 103 101  CO2 21*  --   GLUCOSE 158* 153*  BUN 17 20  CREATININE 1.63* 1.80*  CALCIUM 8.4*  --    Liver Function Tests: No results for input(s): AST, ALT, ALKPHOS, BILITOT, PROT, ALBUMIN in the last 168 hours. No results for input(s): LIPASE, AMYLASE in the last 168 hours. No results for input(s): AMMONIA in the last 168 hours. CBC: Recent Labs  Lab 08/26/17 2212 08/27/17 0226 08/27/17 0238 08/27/17 1200  WBC 5.6 7.4  --   --   HGB 11.5* 9.5* 10.2* 9.3*  HCT 35.8* 29.2* 30.0* 28.2*  MCV 89.9 91.0  --   --   PLT 202 213  --   --    Cardiac Enzymes: No results for input(s): CKTOTAL, CKMB, CKMBINDEX, TROPONINI in the last 168 hours. BNP: Invalid input(s): POCBNP CBG: No results for input(s): GLUCAP in the last 168 hours. D-Dimer No results for input(s): DDIMER in the last 72 hours. Hgb A1c No results for input(s): HGBA1C in the last 72 hours. Lipid Profile No results for input(s): CHOL, HDL, LDLCALC, TRIG,  CHOLHDL, LDLDIRECT in the last 72 hours. Thyroid function studies No results for input(s): TSH, T4TOTAL, T3FREE, THYROIDAB in the last 72 hours.  Invalid input(s): FREET3 Anemia work up No results for input(s): VITAMINB12, FOLATE, FERRITIN, TIBC, IRON, RETICCTPCT in the last 72 hours. Urinalysis    Component Value Date/Time   COLORURINE YELLOW 06/21/2014 0946   APPEARANCEUR CLEAR 06/21/2014 0946   LABSPEC 1.020 02/18/2016 1154   PHURINE 6.0 02/18/2016 1154   PHURINE 5.0 06/21/2014 0946   GLUCOSEU Negative 02/18/2016 1154   HGBUR Negative 02/18/2016 1154   HGBUR NEGATIVE 06/21/2014 0946   BILIRUBINUR Negative 02/18/2016 1154   KETONESUR Negative 02/18/2016 1154   KETONESUR NEGATIVE 06/21/2014 0946   PROTEINUR Negative 02/18/2016 1154   PROTEINUR NEGATIVE 06/21/2014 0946   UROBILINOGEN 0.2 02/18/2016 1154   NITRITE Negative 02/18/2016 1154   NITRITE NEGATIVE 06/21/2014 0946   LEUKOCYTESUR Moderate 02/18/2016 1154   Sepsis Labs Invalid input(s): PROCALCITONIN,  WBC,  LACTICIDVEN Microbiology No results found for this or any previous visit (from the past 240 hour(s)).   Time coordinating discharge: 25  minutes       SIGNED:   Edwin Dada, MD  [facility] 08/27/2017, 1:36 PM

## 2017-08-30 NOTE — Addendum Note (Signed)
Encounter addended by: Sherran Needs, NP on: 08/30/2017 2:13 PM  Actions taken: LOS modified

## 2017-09-01 ENCOUNTER — Encounter: Payer: Self-pay | Admitting: Cardiology

## 2017-09-01 ENCOUNTER — Ambulatory Visit (INDEPENDENT_AMBULATORY_CARE_PROVIDER_SITE_OTHER): Payer: Medicare HMO | Admitting: Cardiology

## 2017-09-01 VITALS — BP 145/72 | HR 65 | Ht 69.0 in | Wt 222.0 lb

## 2017-09-01 DIAGNOSIS — I480 Paroxysmal atrial fibrillation: Secondary | ICD-10-CM

## 2017-09-01 DIAGNOSIS — I10 Essential (primary) hypertension: Secondary | ICD-10-CM | POA: Diagnosis not present

## 2017-09-01 DIAGNOSIS — I2510 Atherosclerotic heart disease of native coronary artery without angina pectoris: Secondary | ICD-10-CM | POA: Diagnosis not present

## 2017-09-01 DIAGNOSIS — E7800 Pure hypercholesterolemia, unspecified: Secondary | ICD-10-CM

## 2017-09-01 LAB — BASIC METABOLIC PANEL
BUN / CREAT RATIO: 10 — AB (ref 12–28)
BUN: 14 mg/dL (ref 8–27)
CO2: 22 mmol/L (ref 20–29)
CREATININE: 1.4 mg/dL — AB (ref 0.57–1.00)
Calcium: 9.6 mg/dL (ref 8.7–10.3)
Chloride: 102 mmol/L (ref 96–106)
GFR calc Af Amer: 42 mL/min/{1.73_m2} — ABNORMAL LOW (ref >59)
GFR, EST NON AFRICAN AMERICAN: 37 mL/min/{1.73_m2} — AB (ref >59)
Glucose: 113 mg/dL — ABNORMAL HIGH (ref 65–99)
Potassium: 4.1 mmol/L (ref 3.5–5.2)
SODIUM: 139 mmol/L (ref 134–144)

## 2017-09-01 NOTE — Patient Instructions (Signed)
Medication Instructions:   NO CHANGE  Labwork:  Your physician recommends that you HAVE LAB WORK TODAY  Follow-Up:  Your physician recommends that you schedule a follow-up appointment in: 3 MONTHS WITH DR CRENSHAW   If you need a refill on your cardiac medications before your next appointment, please call your pharmacy.    

## 2017-09-04 ENCOUNTER — Other Ambulatory Visit: Payer: Self-pay | Admitting: *Deleted

## 2017-09-04 ENCOUNTER — Encounter: Payer: Self-pay | Admitting: *Deleted

## 2017-09-04 NOTE — Patient Outreach (Signed)
[place] [facility]) Care Management  09/04/2017  Carrie Key February 16, 1940 [id number]   RN Health Coach Monthly Outreach  Referral Date:06/21/2017 Referral Source:Telephonic Screening Reason for Referral:Disease Management Education Insurance:[employer]   Outreach Attempt:  Outreach attempt #3 to patient for monthly follow up.  Patient answered and stated she was not able to talk at this time.  Requested telephone call back at later date.  Plan:  RN Health Coach will make another outreach attempt in the month of August.  Dlynn Ranes RN [place] [phone] [email or web address]

## 2017-09-08 DIAGNOSIS — Z09 Encounter for follow-up examination after completed treatment for conditions other than malignant neoplasm: Secondary | ICD-10-CM | POA: Diagnosis not present

## 2017-09-08 DIAGNOSIS — Z4802 Encounter for removal of sutures: Secondary | ICD-10-CM | POA: Diagnosis not present

## 2017-09-08 DIAGNOSIS — W0110XD Fall on same level from slipping, tripping and stumbling with subsequent striking against unspecified object, subsequent encounter: Secondary | ICD-10-CM | POA: Diagnosis not present

## 2017-09-14 ENCOUNTER — Encounter: Payer: Self-pay | Admitting: *Deleted

## 2017-09-14 ENCOUNTER — Other Ambulatory Visit: Payer: Self-pay | Admitting: *Deleted

## 2017-09-14 NOTE — Patient Outreach (Signed)
[place] [facility]) Care Management  09/14/2017  Carrie Key Nov 27, 1940 [id number]   RN Health Coach Monthly Outreach  Referral Date:06/21/2017 Referral Source:Telephonic Screening Reason for Referral:Disease Management Education Insurance:[employer]   Outreach Attempt:  Successful telephone outreach to patient for monthly follow up.  HIPAA verified with patient.  Patient stating she is leaving for an appointment shortly and only had a little time to speak with Health Coach.  States she is doing better.  Reports fall last month in the home where she tripped over rug and hit the stereo table; leading to a laceration on her head.  Patient went to Emergency Room where they placed staples to the laceration.  Reports having staples removed at her primary care provider's office on this past Friday.  Patient stating she does get dizzy at times, but overall feels well.  Denies any further falls and reports using a cane to ambulate while outside of the home.  Fall precautions and preventions reviewed and discussed and patient encouraged to use cane with all ambulation.  Reports some shortness of breath with exertion, but states this is getting better; and states she has a little lower extremity edema that is getting better.  Patient stating she did discuss these symptoms with her physician at her Cardiologist appointment.  Reports not weighing daily.  Discussed importance and reasoning for daily weights; patient stated her understanding.  States her last blood sugar was 113 and she continues to monitor a couple of times a week.  Patient stating she was told at her last primary care appointment her blood sugars was fine, but unsure of her lab results.  Appointments:  Attended appointment with primary care provider on 09/08/2017 and has follow up appointment in September 2019 (unsure of exact date).  Seen by Cardiologist, Dr. Stanford Breed on 09/01/2017 and has scheduled follow up on  12/05/2017.  Patient has pulmonologist appointment on 09/21/2017.  Plan: RN Health Coach will make next monthly outreach to patient in the month of September. RN Health Coach will send patient Living Better with Heart Failure Education Packet.   [place] [phone] [email or web address]

## 2017-09-21 ENCOUNTER — Encounter: Payer: Self-pay | Admitting: Pulmonary Disease

## 2017-09-21 ENCOUNTER — Ambulatory Visit (INDEPENDENT_AMBULATORY_CARE_PROVIDER_SITE_OTHER): Payer: Medicare HMO | Admitting: Pulmonary Disease

## 2017-09-21 DIAGNOSIS — I481 Persistent atrial fibrillation: Secondary | ICD-10-CM

## 2017-09-21 DIAGNOSIS — I4819 Other persistent atrial fibrillation: Secondary | ICD-10-CM

## 2017-09-21 DIAGNOSIS — R0683 Snoring: Secondary | ICD-10-CM

## 2017-09-21 NOTE — Assessment & Plan Note (Signed)
Given excessive daytime somnolence, narrow pharyngeal exam, witnessed apneas & loud snoring, obstructive sleep apnea is very likely & an overnight polysomnogram will be scheduled as a home study. The pathophysiology of obstructive sleep apnea , it's cardiovascular consequences & modes of treatment including CPAP were discused with the patient in detail & they evidenced understanding.  Pretest probability is intermediate to high

## 2017-09-21 NOTE — Progress Notes (Signed)
Subjective:    Patient ID: Carrie Key, female    DOB: 01/03/1941, 77 y.o.   MRN: [id number]  HPI  Chief Complaint  Patient presents with  . Consult    Pt is here today due to an abnormal PFT. Pt has complaints with exertion,  but states it is not as bad as it was due to meds she has been put on.   77 year old remote smoker presents for evaluation of abnormal PFTs. She had chronic atrial fibrillation for the last 6 months and is maintained on anticoagulation and amiodarone, failed cardioversion 05/2017. Cardiac cath showed single-vessel disease and she is being treated medically.  She is maintained on apixaban.  PFTs were obtained which showed intraparenchymal restriction with low DLCO that  corrects for alveolar volume Her only complaint is occasional dyspnea on exertion.  She reports choking and gasping episodes that wake her up from sleep.  Her significant other reports loud snoring throughout the night.  She reports non-refreshing sleep. Epworth sleepiness score is 0 but I suspect that she is under reporting. Bedtime is around 9 PM, sleep latency can be up to 1 hour, she reports frequent nocturnal awakenings until she is finally out of bed at 8 AM feeling tired without dryness of mouth or headaches.   She smoked less than 10 pack years before she quit in 1990  I personally reviewed her prior imaging studies.  Chest x-ray 05/2017 appears clear. Chest x-ray 08/27/2017 suggest mild retrocardiac left lower lobe platelike atelectasis. CT chest from 2014 appears clear   Significant tests/ events reviewed  TEE 05/2017 EF of 40%  PFTs 08/2017 no airway obstruction with ratio of 81, FEV1 of 69% with 10% bronchodilator response, improved to 76%, FVC of 66%, TLC of 65%, DLCO 57% and corrected to 98% for alveolar volume -consistent with mild?  Intraparenchymal restriction   Past Medical History:  Diagnosis Date  . Atrial fibrillation ([place])   . Atrial fibrillation with RVR ([place])  05/08/2017  . Breast cancer ([place])   . Cancer ([place])   . CKD (chronic kidney disease), stage III ([place]) 07/10/2014  . Diabetes mellitus without complication ([place])   . Dyspnea   . GERD (gastroesophageal reflux disease)   . Heart murmur   . Hyperlipemia   . Hypertension   . Wears glasses    Past Surgical History:  Procedure Laterality Date  . ABDOMINAL HYSTERECTOMY    . BACK SURGERY  2002   lumbar lam  . CARDIAC CATHETERIZATION  2003  . CARDIOVERSION N/A 05/15/2017   Procedure: CARDIOVERSION;  Surgeon: Skeet Latch, MD;  Location: [facility];  Service: Cardiovascular;  Laterality: N/A;  . CARDIOVERSION N/A 06/13/2017   Procedure: CARDIOVERSION;  Surgeon: Josue Hector, MD;  Location: [facility] ENDOSCOPY;  Service: Cardiovascular;  Laterality: N/A;  . COLONOSCOPY    . LEFT HEART CATH AND CORONARY ANGIOGRAPHY N/A 05/12/2017   Procedure: LEFT HEART CATH AND CORONARY ANGIOGRAPHY;  Surgeon: Leonie Man, MD;  Location: [facility] CV LAB;  Service: Cardiovascular;  Laterality: N/A;  . SIMPLE MASTECTOMY WITH AXILLARY SENTINEL NODE BIOPSY Right 12/03/2012   Procedure: RIGHT SIMPLE MASTECTOMY WITH RIGHT  AXILLARY SENTINEL LYMPH NODE BIOPSY;  Surgeon: Shann Medal, MD;  Location: [facility];  Service: General;  Laterality: Right;  . TEE WITHOUT CARDIOVERSION N/A 05/15/2017   Procedure: TRANSESOPHAGEAL ECHOCARDIOGRAM (TEE);  Surgeon: Skeet Latch, MD;  Location: [facility];  Service: Cardiovascular;  Laterality: N/A;  . UPPER GI ENDOSCOPY  No Known Allergies    Social History   Socioeconomic History  . Marital status: Widowed    Spouse name: Not on file  . Number of children: 5  . Years of education: 65  . Highest education level: Not on file  Occupational History  . Occupation: Retired    Comment: disability prior to retirement  Social Needs  . Financial resource strain: Not on file  . Food insecurity:    Worry: Not on file    Inability: Not on file  .  Transportation needs:    Medical: Not on file    Non-medical: Not on file  Tobacco Use  . Smoking status: Former Smoker    Packs/day: 0.30    Years: 30.00    Pack years: 9.00    Types: Cigarettes    Last attempt to quit: 11/26/1988    Years since quitting: 28.8  . Smokeless tobacco: Never Used  Substance and Sexual Activity  . Alcohol use: Yes    Alcohol/week: 4.0 standard drinks    Types: 4 Glasses of wine per week    Comment: occ  . Drug use: No  . Sexual activity: Yes    Birth control/protection: Post-menopausal  Lifestyle  . Physical activity:    Days per week: Not on file    Minutes per session: Not on file  . Stress: Not on file  Relationships  . Social connections:    Talks on phone: Not on file    Gets together: Not on file    Attends religious service: Not on file    Active member of club or organization: Not on file    Attends meetings of clubs or organizations: Not on file    Relationship status: Not on file  . Intimate partner violence:    Fear of current or ex partner: Not on file    Emotionally abused: Not on file    Physically abused: Not on file    Forced sexual activity: Not on file  Other Topics Concern  . Not on file  Social History Narrative   Patient is single and lives alone.   Patient is retired.   Patient has five adult children.   Patient has a 11 grade education   Patient is right-handed.   Patient drinks one cup of coffee and two cups of tea daily.    Family History  Problem Relation Age of Onset  . Hypertension Mother   . Stroke Mother   . Hypertension Father   . Heart attack Father   . Hypertension Paternal Grandmother      Review of Systems   Complains of mild shortness of breath on exertion, feet swelling, indigestion and acid heartburn  Constitutional: negative for anorexia, fevers and sweats  Eyes: negative for irritation, redness and visual disturbance  Ears, nose, mouth, throat, and face: negative for earaches,  epistaxis, nasal congestion and sore throat  Respiratory: negative for cough sputum and wheezing  Cardiovascular: negative for chest pain, orthopnea, palpitations and syncope  Gastrointestinal: negative for abdominal pain, constipation, diarrhea, melena, nausea and vomiting  Genitourinary:negative for dysuria, frequency and hematuria  Hematologic/lymphatic: negative for bleeding, easy bruising and lymphadenopathy  Musculoskeletal:negative for arthralgias, muscle weakness and stiff joints  Neurological: negative for coordination problems, gait problems, headaches and weakness  Endocrine: negative for diabetic symptoms including polydipsia, polyuria and weight loss     Objective:   Physical Exam  Gen. Pleasant, obese, in no distress, normal affect ENT - mild underbite, no post  nasal drip, class 2-3 airway Neck: No JVD, no thyromegaly, no carotid bruits Lungs: no use of accessory muscles, no dullness to percussion, decreased without rales or rhonchi  Cardiovascular: Rhythm regular, heart sounds  normal, no murmurs or gallops, 1+ peripheral edema Abdomen: soft and non-tender, no hepatosplenomegaly, BS normal. Musculoskeletal: No deformities, no cyanosis or clubbing Neuro:  alert, non focal, no tremors        Assessment & Plan:

## 2017-09-21 NOTE — Patient Instructions (Signed)
Okay to continue amiodarone. Repeat chest x-ray on next visit  Schedule home sleep study

## 2017-09-21 NOTE — Assessment & Plan Note (Addendum)
Okay to continue amiodarone. Repeat chest x-ray on next visit  DLCO is reduced but seems to correct for alveolar volume.  Would monitor DLCO every 6 months

## 2017-09-25 DIAGNOSIS — E785 Hyperlipidemia, unspecified: Secondary | ICD-10-CM | POA: Diagnosis not present

## 2017-09-25 DIAGNOSIS — I10 Essential (primary) hypertension: Secondary | ICD-10-CM | POA: Diagnosis not present

## 2017-09-25 DIAGNOSIS — R110 Nausea: Secondary | ICD-10-CM | POA: Diagnosis not present

## 2017-09-25 DIAGNOSIS — R7303 Prediabetes: Secondary | ICD-10-CM | POA: Diagnosis not present

## 2017-10-10 ENCOUNTER — Ambulatory Visit (HOSPITAL_COMMUNITY): Admission: RE | Admit: 2017-10-10 | Payer: Medicare HMO | Source: Ambulatory Visit

## 2017-10-10 ENCOUNTER — Inpatient Hospital Stay: Payer: Medicare HMO | Attending: Oncology

## 2017-10-10 DIAGNOSIS — Z9011 Acquired absence of right breast and nipple: Secondary | ICD-10-CM | POA: Insufficient documentation

## 2017-10-10 DIAGNOSIS — I4891 Unspecified atrial fibrillation: Secondary | ICD-10-CM | POA: Insufficient documentation

## 2017-10-10 DIAGNOSIS — Z170 Estrogen receptor positive status [ER+]: Secondary | ICD-10-CM | POA: Insufficient documentation

## 2017-10-10 DIAGNOSIS — Z7901 Long term (current) use of anticoagulants: Secondary | ICD-10-CM | POA: Insufficient documentation

## 2017-10-10 DIAGNOSIS — Z79899 Other long term (current) drug therapy: Secondary | ICD-10-CM | POA: Insufficient documentation

## 2017-10-10 DIAGNOSIS — I129 Hypertensive chronic kidney disease with stage 1 through stage 4 chronic kidney disease, or unspecified chronic kidney disease: Secondary | ICD-10-CM | POA: Insufficient documentation

## 2017-10-10 DIAGNOSIS — Z9223 Personal history of estrogen therapy: Secondary | ICD-10-CM | POA: Insufficient documentation

## 2017-10-10 DIAGNOSIS — E1122 Type 2 diabetes mellitus with diabetic chronic kidney disease: Secondary | ICD-10-CM | POA: Insufficient documentation

## 2017-10-10 DIAGNOSIS — K219 Gastro-esophageal reflux disease without esophagitis: Secondary | ICD-10-CM | POA: Insufficient documentation

## 2017-10-10 DIAGNOSIS — Z853 Personal history of malignant neoplasm of breast: Secondary | ICD-10-CM | POA: Insufficient documentation

## 2017-10-10 DIAGNOSIS — E785 Hyperlipidemia, unspecified: Secondary | ICD-10-CM | POA: Insufficient documentation

## 2017-10-10 DIAGNOSIS — N183 Chronic kidney disease, stage 3 (moderate): Secondary | ICD-10-CM | POA: Insufficient documentation

## 2017-10-10 DIAGNOSIS — Z87891 Personal history of nicotine dependence: Secondary | ICD-10-CM | POA: Insufficient documentation

## 2017-10-15 NOTE — Progress Notes (Signed)
ID: Carrie Key OB: April 08, 1940  MR#: [id number]  DPO#:[id number]  PCP: Glendale Chard, MD GYN:   SU: Carrie Key OTHER MD: Thea Silversmith, Herbie Baltimore Buccini  CHIEF COMPLAINT: Right Breast Cancer  CURRENT TREATMENT: Anastrozole  BREAST CANCER HISTORY: From the initial intake note:  Carrie Key noted a lump in the upper-outer quadrant of her right breast and brought it to the attention of her primary care physician. She was set up for bilateral diagnostic mammography 10/31/2012 at [facility] This showed her breast to be category A., almost entirely fatty. A 1.6 cm irregular high density mass with indistinct margins was noted in the right upper outer quadrant. This was palpable. Ultrasound of the right breast showed the mass to measure 1.5 cm, and to be lobulated. There was no axillary abnormality by ultrasound. Biopsy of the mass in question 11/01/2012 showed ([id number]) invasive ductal carcinoma, grade 1, estrogen and progesterone receptors both 100% positive. Biopsies obtained lateral and medial to this mass on the same day showed an identical morphology.  On 11/07/2012 the patient underwent bilateral breast MRI, which showed a total area of involvement measuring 13.2 cm, extending from the central to the upper lateral right breast. There were no abnormal appearing lymph nodes in the left breast was unremarkable.  Her subsequent history is as detailed below.   INTERVAL HISTORY: Carrie Key returns today for follow up of her breast cancer. She continues on anastrozole, with good tolerance. She denies issues with hot flashes or vaginal dryness.   She has not had mammography yet this year at [facility]. Her last bone density was in 2017.   She was seen in the ED for a fall on 08/26/2017 in which she had head trauma. She had a CT Head and cervical spine on 08/26/2017 showing: No definite acute intracranial hemorrhage on this motion degraded exam. No acute/traumatic cervical spine pathology.  She also  had a chest xray on 08/27/2017 for SOB which showed: Cardiomegaly with perihilar vascular congestion without overt pulmonary edema. Streaky opacity within the retrocardiac left lower lobe, favored to reflect atelectasis.    REVIEW OF SYSTEMS: Carrie Key reports that in July, she tripped over her rug in her home and hit her head on the entertainment center. She follows up with her cardiologist, Dr. Stanford Breed on 12/05/2017. She has unusual chest feelings daily. She is also trying to manage her high blood pressure. She notes that her family is doing well. She denies unusual headaches, visual changes, nausea, vomiting, or dizziness. There has been no unusual cough, phlegm production, or pleurisy. There has been no change in bowel or bladder habits. She denies unexplained fatigue or unexplained weight loss, bleeding, rash, or fever. A detailed review of systems was otherwise stable.    PAST MEDICAL HISTORY: Past Medical History:  Diagnosis Date  . Atrial fibrillation ([place])   . Atrial fibrillation with RVR ([place]) 05/08/2017  . Breast cancer ([place])   . Cancer ([place])   . CKD (chronic kidney disease), stage III ([place]) 07/10/2014  . Diabetes mellitus without complication ([place])   . Dyspnea   . GERD (gastroesophageal reflux disease)   . Heart murmur   . Hyperlipemia   . Hypertension   . Wears glasses     PAST SURGICAL HISTORY: Past Surgical History:  Procedure Laterality Date  . ABDOMINAL HYSTERECTOMY    . BACK SURGERY  2002   lumbar lam  . CARDIAC CATHETERIZATION  2003  . CARDIOVERSION N/A 05/15/2017   Procedure: CARDIOVERSION;  Surgeon: Skeet Latch, MD;  Location:  [facility] ENDOSCOPY;  Service: Cardiovascular;  Laterality: N/A;  . CARDIOVERSION N/A 06/13/2017   Procedure: CARDIOVERSION;  Surgeon: Josue Hector, MD;  Location: [facility] ENDOSCOPY;  Service: Cardiovascular;  Laterality: N/A;  . COLONOSCOPY    . LEFT HEART CATH AND CORONARY ANGIOGRAPHY N/A 05/12/2017   Procedure: LEFT HEART CATH AND CORONARY  ANGIOGRAPHY;  Surgeon: Leonie Man, MD;  Location: [place] CV LAB;  Service: Cardiovascular;  Laterality: N/A;  . SIMPLE MASTECTOMY WITH AXILLARY SENTINEL NODE BIOPSY Right 12/03/2012   Procedure: RIGHT SIMPLE MASTECTOMY WITH RIGHT  AXILLARY SENTINEL LYMPH NODE BIOPSY;  Surgeon: Shann Medal, MD;  Location: [place];  Service: General;  Laterality: Right;  . TEE WITHOUT CARDIOVERSION N/A 05/15/2017   Procedure: TRANSESOPHAGEAL ECHOCARDIOGRAM (TEE);  Surgeon: Skeet Latch, MD;  Location: [place];  Service: Cardiovascular;  Laterality: N/A;  . UPPER GI ENDOSCOPY      FAMILY HISTORY  (updated 01/07/2013) Family History  Problem Relation Age of Onset  . Hypertension Mother   . Stroke Mother   . Hypertension Father   . Heart attack Father   . Hypertension Paternal Grandmother    The patient's father died at the age of 61 from a myocardial infarction. The patient's mother died at the age of 28 from a stroke. The patient has 5 brothers, 4 sisters. There is no history of breast or ovarian cancer in the family  GYNECOLOGIC HISTORY:   (Updated 01/07/2013) Menarche age 55, first live birth age 27. The patient is GX P6. She stopped having periods in 1990. She is status post total abdominal hysterectomy with bilateral salpingo-oophorectomy. She never took hormone replacement.  SOCIAL HISTORY:   (Updated 01/07/2013) Carrie Key worked in [employer] at [employer] but retired 2000. She lives by herself, with no pets, although her friend and significant other, [name], visits daily. She has one son who died from a stroke at age 63. Son [name] works for [employer] in [place]. Daughter [name] works in a [employer] in [place]. Son [name] lives in [place], and is currently unemployed. Son [name] lives in [place] and works [place]. The patient has 11 grandchildren. She attends [employer]    ADVANCED  DIRECTIVES: Not in place   HEALTH MAINTENANCE:  (Updated 01/07/2013) Social History   Tobacco Use  . Smoking status: Former Smoker    Packs/day: 0.30    Years: 30.00    Pack years: 9.00    Types: Cigarettes    Last attempt to quit: 11/26/1988    Years since quitting: 28.9  . Smokeless tobacco: Never Used  Substance Use Topics  . Alcohol use: Yes    Alcohol/week: 4.0 standard drinks    Types: 4 Glasses of wine per week    Comment: occ  . Drug use: No     Colonoscopy: 2013  PAP: Status post hysterectomy  Bone density: Not on file  Lipid panel:  Dr. Karlton [place]  No Known Allergies  Current Outpatient Medications  Medication Sig Dispense Refill  . albuterol (PROVENTIL HFA;VENTOLIN HFA) 108 (90 Base) MCG/ACT inhaler Inhale 1-2 puffs into the lungs every 6 (six) hours as needed for wheezing or shortness of breath.    Marland Kitchen amiodarone (PACERONE) 200 MG tablet Take 1 tablet (200 mg total) by mouth daily. 30 tablet 6  . amLODipine (NORVASC) 10 MG tablet Take 1 tablet (10 mg total) by mouth daily. 30 tablet 6  . apixaban (ELIQUIS) 5 MG TABS  tablet Take 1 tablet (5 mg total) by mouth 2 (two) times daily. 60 tablet 6  . carvedilol (COREG) 25 MG tablet Take 1 tablet (25 mg total) by mouth 2 (two) times daily. 180 tablet 3  . carvedilol (COREG) 25 MG tablet Take 25 mg by mouth 2 (two) times daily with a meal.    . furosemide (LASIX) 40 MG tablet Take 40 mg by mouth daily.     Marland Kitchen gabapentin (NEURONTIN) 300 MG capsule Take 1 capsule (300 mg total) by mouth at bedtime. (Patient taking differently: Take 300 mg by mouth at bedtime as needed (pain). ) 30 capsule 6  . hydrALAZINE (APRESOLINE) 100 MG tablet Take 1 tablet (100 mg total) by mouth every 8 (eight) hours. (Patient taking differently: Take 100 mg by mouth daily. ) 270 tablet 3  . isosorbide dinitrate (ISORDIL) 10 MG tablet Take 10 mg by mouth daily.    . nitroGLYCERIN (NITROSTAT) 0.4 MG SL tablet Place 1 tablet (0.4 mg total) under the tongue  every 5 (five) minutes as needed for chest pain. (Patient not taking: Reported on 09/21/2017) 25 tablet 1  . omeprazole (PRILOSEC) 40 MG capsule Take 40 mg by mouth daily.     . potassium chloride SA (K-DUR,KLOR-CON) 20 MEQ tablet Take 1 tablet (20 mEq total) by mouth daily. 30 tablet 3  . rosuvastatin (CRESTOR) 10 MG tablet Take 1 tablet (10 mg total) by mouth daily. 90 tablet 3   No current facility-administered medications for this visit.     OBJECTIVE: Older African American woman using a cane to help her ambulate  Vitals:   10/16/17 1355  BP: (!) 179/89  Pulse: 67  Resp: 18  Temp: 98.6 F (37 C)  SpO2: 100%     Body mass index is 37.57 kg/m.    ECOG FS: 1 Filed Weights   10/16/17 1355  Weight: 232 lb 12.8 oz (105.6 kg)   Sclerae unicteric, EOMs intact Oropharynx clear and moist No cervical or supraclavicular adenopathy Lungs no rales or rhonchi Heart regular rate and rhythm Abd soft, nontender, positive bowel sounds MSK no focal spinal tenderness, no upper extremity lymphedema Neuro: nonfocal, well oriented, appropriate affect Breasts: Status post right mastectomy with no evidence of chest wall recurrence.  The left breast is benign.  Both axillae are benign.  LAB RESULTS:  Lab Results  Component Value Date   WBC 7.4 08/27/2017   NEUTROABS 4.3 02/20/2017   HGB 9.3 (L) 08/27/2017   HCT 28.2 (L) 08/27/2017   MCV 91.0 08/27/2017   PLT 213 08/27/2017      Chemistry      Component Value Date/Time   NA 139 09/01/2017 1139   NA 140 01/28/2015 1132   K 4.1 09/01/2017 1139   K 3.6 01/28/2015 1132   CL 102 09/01/2017 1139   CO2 22 09/01/2017 1139   CO2 25 01/28/2015 1132   BUN 14 09/01/2017 1139   BUN 18.9 01/28/2015 1132   CREATININE 1.40 (H) 09/01/2017 1139   CREATININE 1.37 (H) 02/20/2017 1239   CREATININE 1.35 (H) 05/06/2016 1116   CREATININE 1.2 (H) 01/28/2015 1132      Component Value Date/Time   CALCIUM 9.6 09/01/2017 1139   CALCIUM 9.6 01/28/2015  1132   ALKPHOS 92 02/20/2017 1239   ALKPHOS 66 01/28/2015 1132   AST 31 02/20/2017 1239   AST 15 01/28/2015 1132   ALT 19 02/20/2017 1239   ALT <9 01/28/2015 1132   BILITOT 0.6 02/20/2017 1239  BILITOT 0.49 01/28/2015 1132       STUDIES: Since her last visit, she underwent routine unilateral left breast mammography with CAD and tomography on 09/05/2016 at [facility] showing: breast density category A. There was no evidence of malignancy. S/P Right mastectomy.  ASSESSMENT: 77 y.o. [place] woman status post right breast biopsy at 3 separate sites in the right breast, all 3 sites showing ductal carcinoma in situ, low-grade, estrogen and progesterone receptors both 100% positive  (1) Status post right mastectomy and sentinel lymph node sampling 12/03/2012 for a pT3 pN1, stage IIIA invasive ductal carcinoma, estrogen receptor 99% positive, progesterone receptor 96% positive, with an MIB-1 of 9% and no HER-2 amplification.  (2) Oncotype DX score of 6 predicts a distant recurrence risk of 8% within the next 10 years if the patient's only systemic treatment is tamoxifen for 5 years. It also predicts no benefit from chemotherapy  (3)  began anastrozole, 01/07/2013,  to continue for 5 years.  (a) DEXA scan at [facility] 02/25/2015 showed a T score of -1.4  PLAN: Carrie Key is now just about 5 years out from definitive surgery for her breast cancer with no evidence of disease recurrence.  This is very favorable.  She went off the anastrozole more or less by mistake 2 months ago.  She had been seen in the emergency room and they asked her to stop the medication.  Nevertheless she was going to be going off now in any case since she is completing 5 years of antiestrogen therapy.  Unfortunately she did not feel any better 2 months after being off anastrozole.  This means the reason she does not feel well is her multiple blood pressure medications or possibly her Crestor.  She will be discussing that with Dr.  Stanford Breed when she sees him again in October.  At this point I feel comfortable releasing her to her primary care physician.  All she will need in terms of breast cancer follow-up is yearly mammography and a yearly physician breast exam.  Incidentally her mammograms are a little bit behind and I have gone ahead and placed in order for her to be contacted to get that done  I will be glad to see Carrie Key again at any point in the future if and when the need arises but as of now are making no further routine appointments for her here.   Carrie Key, Virgie Dad, MD  10/16/17 2:12 PM Medical Oncology and Hematology [facility] [address] [place], [place] [address] Tel. [phone]    Fax. [phone]  Alice Rieger, am acting as scribe for Chauncey Cruel MD.  I, Lurline Del MD, have reviewed the above documentation for accuracy and completeness, and I agree with the above.

## 2017-10-16 ENCOUNTER — Inpatient Hospital Stay (HOSPITAL_BASED_OUTPATIENT_CLINIC_OR_DEPARTMENT_OTHER): Payer: Medicare HMO | Admitting: Oncology

## 2017-10-16 VITALS — BP 179/89 | HR 67 | Temp 98.6°F | Resp 18 | Ht 66.0 in | Wt 232.8 lb

## 2017-10-16 DIAGNOSIS — Z9223 Personal history of estrogen therapy: Secondary | ICD-10-CM | POA: Diagnosis not present

## 2017-10-16 DIAGNOSIS — C50411 Malignant neoplasm of upper-outer quadrant of right female breast: Secondary | ICD-10-CM

## 2017-10-16 DIAGNOSIS — E785 Hyperlipidemia, unspecified: Secondary | ICD-10-CM

## 2017-10-16 DIAGNOSIS — Z7901 Long term (current) use of anticoagulants: Secondary | ICD-10-CM | POA: Diagnosis not present

## 2017-10-16 DIAGNOSIS — K219 Gastro-esophageal reflux disease without esophagitis: Secondary | ICD-10-CM | POA: Diagnosis not present

## 2017-10-16 DIAGNOSIS — I129 Hypertensive chronic kidney disease with stage 1 through stage 4 chronic kidney disease, or unspecified chronic kidney disease: Secondary | ICD-10-CM | POA: Diagnosis not present

## 2017-10-16 DIAGNOSIS — N183 Chronic kidney disease, stage 3 (moderate): Secondary | ICD-10-CM

## 2017-10-16 DIAGNOSIS — E1122 Type 2 diabetes mellitus with diabetic chronic kidney disease: Secondary | ICD-10-CM

## 2017-10-16 DIAGNOSIS — Z9011 Acquired absence of right breast and nipple: Secondary | ICD-10-CM | POA: Diagnosis not present

## 2017-10-16 DIAGNOSIS — Z853 Personal history of malignant neoplasm of breast: Secondary | ICD-10-CM

## 2017-10-16 DIAGNOSIS — Z170 Estrogen receptor positive status [ER+]: Secondary | ICD-10-CM

## 2017-10-16 DIAGNOSIS — I4891 Unspecified atrial fibrillation: Secondary | ICD-10-CM | POA: Diagnosis not present

## 2017-10-16 DIAGNOSIS — Z87891 Personal history of nicotine dependence: Secondary | ICD-10-CM | POA: Diagnosis not present

## 2017-10-16 DIAGNOSIS — Z79899 Other long term (current) drug therapy: Secondary | ICD-10-CM

## 2017-10-17 ENCOUNTER — Telehealth: Payer: Self-pay | Admitting: Oncology

## 2017-10-17 NOTE — Telephone Encounter (Signed)
Per 9/9 no los

## 2017-10-20 ENCOUNTER — Other Ambulatory Visit: Payer: Self-pay | Admitting: *Deleted

## 2017-10-20 NOTE — Patient Outreach (Signed)
[facility] [facility]) Care Management  10/20/2017  Carrie Key 1941-01-14 [id number]   RN Health Coach Monthly Outreach  Referral Date:06/21/2017 Referral Source:Telephonic Screening Reason for Referral:Disease Management Education Insurance:[employer] Medicare   Outreach Attempt:  Successful telephone outreach to patient for monthly follow up.  HIPAA verified with patient.  Patient stated she is not feeling well.  Thinks her medications are making her sick.  Requested to review medications with patient.  Patient requested RN Health Coach to hold on while she went to retrieve her medications and glasses.  As [place] on hold, can hear in the background arguing and fighting with what sounded like glass breaking.  Can hear a female stating call the police; can hear someone come back to telephone and try to click line over.  RN Health Coach hung up so as to allow patient/person to call 911.  RN Health Coach placed another outreach call to patient to verify she was ok.  Patient answered and verified HIPAA.  Stated she was fine and was not in any harm or danger at the time.  Discussed with patient would outreach to her next week to review medications per her request.  Plan:  [place] will make another outreach to patient next week.  [place] [phone] [email or web address]

## 2017-10-24 ENCOUNTER — Other Ambulatory Visit: Payer: Self-pay | Admitting: *Deleted

## 2017-10-24 NOTE — Patient Outreach (Signed)
[place] [facility]) Care Management  10/24/2017  Audryana Hockenberry 18-Sep-1940 [id number]   RN Health Coach Monthly Outreach  Referral Date:06/21/2017 Referral Source:Telephonic Screening Reason for Referral:Disease Management Education Insurance:[employer] Medicare   Outreach Attempt:  Unsuccessful Outreach to patient for monthly telephone follow up.  Female answered and stated patient was not home.  HIPAA compliant voicemail message left with female answering.  Plan:  RN Health Coach will make another outreach to patient for follow up in the month of September.  [place] [phone] [email or web address]

## 2017-11-06 ENCOUNTER — Other Ambulatory Visit: Payer: Self-pay | Admitting: *Deleted

## 2017-11-06 NOTE — Patient Outreach (Signed)
[facility] [facility]) Care Management  11/06/2017  Carrie Key Dec 19, 1940 [id number]   RN Health Coach Monthly Outreach  Referral Date:06/21/2017 Referral Source:Telephonic Screening Reason for Referral:Disease Management Education Insurance:[employer] Medicare   Outreach Attempt:  Outreach attempt #3 to patient for monthly follow up.  Patient answered and stated she was on her way out the door and is requesting a telephone call back.   Plan:  RN Health Coach will make another telephone outreach to patient in the month of October.  [place] [phone] [email or web address]

## 2017-11-28 ENCOUNTER — Encounter: Payer: Self-pay | Admitting: *Deleted

## 2017-11-28 ENCOUNTER — Other Ambulatory Visit: Payer: Self-pay | Admitting: *Deleted

## 2017-11-28 NOTE — Progress Notes (Deleted)
HPI: Follow-up atrial fibrillation.  Patient seen March 2018 with atrial fibrillation.  Last echocardiogram April 2019 showed normal LV function, mild aortic stenosis, mildly dilated ascending aorta, mild to moderate mitral regurgitation and mild to moderate tricuspid regurgitation.  Cardiac catheterization April 2019 showed a 60% first marginal, ejection fraction 45 to 50% and 1-2+ mitral regurgitation.  Medical therapy recommended.  Patient had TEE guided cardioversion April 2019.  Ejection fraction 40 to 45%, mild mitral regurgitation and tricuspid regurgitation.  Patient had recurrent atrial arrhythmias and was ultimately placed on amiodarone.  At last office visit she was in sinus rhythm.  She was scheduled to see pulmonary to see if amiodarone could be continued as she has abnormal PFTs.  She has also been treated for CHF with diuretics. Seen by pulmonary and felt amiodarone could be continued.  Since last seen   Current Outpatient Medications  Medication Sig Dispense Refill  . albuterol (PROVENTIL HFA;VENTOLIN HFA) 108 (90 Base) MCG/ACT inhaler Inhale 1-2 puffs into the lungs every 6 (six) hours as needed for wheezing or shortness of breath.    Marland Kitchen amiodarone (PACERONE) 200 MG tablet Take 1 tablet (200 mg total) by mouth daily. 30 tablet 6  . amLODipine (NORVASC) 10 MG tablet Take 1 tablet (10 mg total) by mouth daily. 30 tablet 6  . apixaban (ELIQUIS) 5 MG TABS tablet Take 1 tablet (5 mg total) by mouth 2 (two) times daily. 60 tablet 6  . carvedilol (COREG) 25 MG tablet Take 1 tablet (25 mg total) by mouth 2 (two) times daily. 180 tablet 3  . carvedilol (COREG) 25 MG tablet Take 25 mg by mouth 2 (two) times daily with a meal.    . furosemide (LASIX) 40 MG tablet Take 40 mg by mouth daily.     Marland Kitchen gabapentin (NEURONTIN) 300 MG capsule Take 1 capsule (300 mg total) by mouth at bedtime. (Patient taking differently: Take 300 mg by mouth at bedtime as needed (pain). ) 30 capsule 6  . hydrALAZINE  (APRESOLINE) 100 MG tablet Take 1 tablet (100 mg total) by mouth every 8 (eight) hours. (Patient taking differently: Take 100 mg by mouth daily. ) 270 tablet 3  . isosorbide dinitrate (ISORDIL) 10 MG tablet Take 10 mg by mouth daily.    . nitroGLYCERIN (NITROSTAT) 0.4 MG SL tablet Place 1 tablet (0.4 mg total) under the tongue every 5 (five) minutes as needed for chest pain. (Patient not taking: Reported on 09/21/2017) 25 tablet 1  . omeprazole (PRILOSEC) 40 MG capsule Take 40 mg by mouth daily.     . potassium chloride SA (K-DUR,KLOR-CON) 20 MEQ tablet Take 1 tablet (20 mEq total) by mouth daily. 30 tablet 3  . rosuvastatin (CRESTOR) 10 MG tablet Take 1 tablet (10 mg total) by mouth daily. 90 tablet 3   No current facility-administered medications for this visit.      Past Medical History:  Diagnosis Date  . Atrial fibrillation ([place])   . Atrial fibrillation with RVR ([place]) 05/08/2017  . Breast cancer ([place])   . Cancer ([place])   . CKD (chronic kidney disease), stage III ([place]) 07/10/2014  . Diabetes mellitus without complication ([place])   . Dyspnea   . GERD (gastroesophageal reflux disease)   . Heart murmur   . Hyperlipemia   . Hypertension   . Wears glasses     Past Surgical History:  Procedure Laterality Date  . ABDOMINAL HYSTERECTOMY    . BACK SURGERY  2002  lumbar lam  . CARDIAC CATHETERIZATION  2003  . CARDIOVERSION N/A 05/15/2017   Procedure: CARDIOVERSION;  Surgeon: Skeet Latch, MD;  Location: [place];  Service: Cardiovascular;  Laterality: N/A;  . CARDIOVERSION N/A 06/13/2017   Procedure: CARDIOVERSION;  Surgeon: Josue Hector, MD;  Location: [facility] ENDOSCOPY;  Service: Cardiovascular;  Laterality: N/A;  . COLONOSCOPY    . LEFT HEART CATH AND CORONARY ANGIOGRAPHY N/A 05/12/2017   Procedure: LEFT HEART CATH AND CORONARY ANGIOGRAPHY;  Surgeon: Leonie Man, MD;  Location: [place] CV LAB;  Service: Cardiovascular;  Laterality: N/A;  . SIMPLE MASTECTOMY WITH AXILLARY  SENTINEL NODE BIOPSY Right 12/03/2012   Procedure: RIGHT SIMPLE MASTECTOMY WITH RIGHT  AXILLARY SENTINEL LYMPH NODE BIOPSY;  Surgeon: Shann Medal, MD;  Location: [place];  Service: General;  Laterality: Right;  . TEE WITHOUT CARDIOVERSION N/A 05/15/2017   Procedure: TRANSESOPHAGEAL ECHOCARDIOGRAM (TEE);  Surgeon: Skeet Latch, MD;  Location: [place];  Service: Cardiovascular;  Laterality: N/A;  . UPPER GI ENDOSCOPY      Social History   Socioeconomic History  . Marital status: Widowed    Spouse name: Not on file  . Number of children: 5  . Years of education: 78  . Highest education level: Not on file  Occupational History  . Occupation: Retired    Comment: disability prior to retirement  Social Needs  . Financial resource strain: Not on file  . Food insecurity:    Worry: Not on file    Inability: Not on file  . Transportation needs:    Medical: Not on file    Non-medical: Not on file  Tobacco Use  . Smoking status: Former Smoker    Packs/day: 0.30    Years: 30.00    Pack years: 9.00    Types: Cigarettes    Last attempt to quit: 11/26/1988    Years since quitting: 29.0  . Smokeless tobacco: Never Used  Substance and Sexual Activity  . Alcohol use: Yes    Alcohol/week: 4.0 standard drinks    Types: 4 Glasses of wine per week    Comment: occ  . Drug use: No  . Sexual activity: Yes    Birth control/protection: Post-menopausal  Lifestyle  . Physical activity:    Days per week: Not on file    Minutes per session: Not on file  . Stress: Not on file  Relationships  . Social connections:    Talks on phone: Not on file    Gets together: Not on file    Attends religious service: Not on file    Active member of club or organization: Not on file    Attends meetings of clubs or organizations: Not on file    Relationship status: Not on file  . Intimate partner violence:    Fear of current or ex partner: Not on file    Emotionally abused: Not on  file    Physically abused: Not on file    Forced sexual activity: Not on file  Other Topics Concern  . Not on file  Social History Narrative   Patient is single and lives alone.   Patient is retired.   Patient has five adult children.   Patient has a 11 grade education   Patient is right-handed.   Patient drinks one cup of coffee and two cups of tea daily.    Family History  Problem Relation Age of Onset  . Hypertension Mother   . Stroke Mother   .  Hypertension Father   . Heart attack Father   . Hypertension Paternal Grandmother     ROS: no fevers or chills, productive cough, hemoptysis, dysphasia, odynophagia, melena, hematochezia, dysuria, hematuria, rash, seizure activity, orthopnea, PND, pedal edema, claudication. Remaining systems are negative.  Physical Exam: Well-developed well-nourished in no acute distress.  Skin is warm and dry.  HEENT is normal.  Neck is supple.  Chest is clear to auscultation with normal expansion.  Cardiovascular exam is regular rate and rhythm.  Abdominal exam nontender or distended. No masses palpated. Extremities show no edema. neuro grossly intact  ECG- personally reviewed  A/P  1 paroxysmal atrial fibrillation-patient is in sinus rhythm today.  Continue amiodarone at present dose.  Check TSH, liver functions. Continue apixaban.  Check hemoglobin and renal function.  2 chronic combined systolic/diastolic congestive heart failure-patient is euvolemic today on examination.  Continue Lasix at present dose.  We will plan to repeat echocardiogram to see if LV function has normalized.  Continue fluid restriction and low-sodium diet.  3 coronary artery disease-plan to continue medical therapy.  Continue statin.  No aspirin given need for apixaban.  4 hypertension-blood pressure is controlled.  Continue present medications and follow.  5 thoracic aortic aneurysm-plan follow-up echocardiogram April 2020.  6 hyperlipidemia-continue  statin.  7 chronic stage III kidney disease  Kirk Ruths, MD

## 2017-11-28 NOTE — Patient Outreach (Signed)
[place] [facility]) Care Management  [place]  11/29/2017   Aireana Ryland [birth year] [id number]   [place] Monthly Outreach   Referral Date:  06/21/2017 Referral Source:  Telephonic Screening Reason for Referral:  Disease Management Education Insurance:  [facility] Medicare   Outreach Attempt:  Successful telephone outreach to patient for monthly follow up.  HIPAA verified with patient.  Patient reporting she is having a lot of dizziness and nausea most days since fall few months ago.  Reports she has started to spread taking her medication out and has felt a little better.  Reviewed medications and indications with patient.  Denies any recent falls.  States when she does monitor her blood pressure at home SBP is typically below 100 or right at 100's.  Reviewed signs and symptoms of hypotension and encouraged patient to contact physician and request to be seen.  Denies having any palpitations.  Encounter Medications:  Outpatient Encounter Medications as of 11/28/2017  Medication Sig Note  . albuterol (PROVENTIL HFA;VENTOLIN HFA) 108 (90 Base) MCG/ACT inhaler Inhale 1-2 puffs into the lungs every 6 (six) hours as needed for wheezing or shortness of breath.   Marland Kitchen amiodarone (PACERONE) 200 MG tablet Take 1 tablet (200 mg total) by mouth daily.   Marland Kitchen amLODipine (NORVASC) 10 MG tablet Take 1 tablet (10 mg total) by mouth daily. 11/28/2017: Reports taking 5 mg daily  . apixaban (ELIQUIS) 5 MG TABS tablet Take 1 tablet (5 mg total) by mouth 2 (two) times daily.   . carvedilol (COREG) 25 MG tablet Take 25 mg by mouth 2 (two) times daily with a meal.   . furosemide (LASIX) 40 MG tablet Take 40 mg by mouth daily.    Marland Kitchen gabapentin (NEURONTIN) 300 MG capsule Take 1 capsule (300 mg total) by mouth at bedtime. (Patient taking differently: Take 300 mg by mouth at bedtime as needed (pain). )   . glimepiride (AMARYL) 1 MG tablet Take 1 mg by mouth daily with breakfast.   .  hydrALAZINE (APRESOLINE) 100 MG tablet Take 1 tablet (100 mg total) by mouth every 8 (eight) hours. (Patient taking differently: Take 100 mg by mouth daily. ) 11/28/2017: Taking 134m every 8 hours  . isosorbide dinitrate (ISORDIL) 10 MG tablet Take 10 mg by mouth daily. 11/28/2017: Reports taking twice a day  . nitroGLYCERIN (NITROSTAT) 0.4 MG SL tablet Place 1 tablet (0.4 mg total) under the tongue every 5 (five) minutes as needed for chest pain.   .Marland Kitchenomeprazole (PRILOSEC) 40 MG capsule Take 40 mg by mouth daily.    . ondansetron (ZOFRAN) 4 MG tablet Take 4 mg by mouth every 8 (eight) hours as needed for nausea or vomiting.   . potassium chloride SA (K-DUR,KLOR-CON) 20 MEQ tablet Take 1 tablet (20 mEq total) by mouth daily.   . rosuvastatin (CRESTOR) 10 MG tablet Take 1 tablet (10 mg total) by mouth daily.   . carvedilol (COREG) 25 MG tablet Take 1 tablet (25 mg total) by mouth 2 (two) times daily.    No facility-administered encounter medications on file as of 11/28/2017.     Functional Status:  In your present state of health, do you have any difficulty performing the following activities: 08/27/2017 07/17/2017  Hearing? N N  Vision? N N  Difficulty concentrating or making decisions? N N  Walking or climbing stairs? N Y  Comment - trouble going up stairs  Dressing or bathing? N N  Doing errands, shopping? N N  Preparing Food and eating ? - N  Using the Toilet? - N  In the past six months, have you accidently leaked urine? - Y  Do you have problems with loss of bowel control? - N  Managing your Medications? - N  Managing your Finances? - N  Housekeeping or managing your Housekeeping? - N  Some recent data might be hidden    Fall/Depression Screening: Fall Risk  11/28/2017 09/14/2017 07/17/2017  Falls in the past year? Yes Yes No  Comment - fell over rug in the house 08/26/2017 resulted in laceration to the head -  Number falls in past yr: 1 1 -  Injury with Fall? Yes Yes -  Comment -  laceration to the head (staples) -  Risk Factor Category  High Fall Risk High Fall Risk -  Risk for fall due to : Impaired balance/gait;Medication side effect;Impaired mobility;Impaired vision;History of fall(s) History of fall(s);Medication side effect;Impaired balance/gait;Impaired mobility -  Follow up Falls prevention discussed;Education provided Education provided;Falls prevention discussed -   PHQ 2/9 Scores 07/17/2017 06/21/2017  PHQ - 2 Score 0 1    THN CM Care Plan Problem One     Most Recent Value  Care Plan Problem One  Knowledge deficiet related to self care management of atrial fibrillation.  Role Documenting the Problem One  [place] for Problem One  Active  [facility] Long Term Goal   Patient will report less dizziness and nausea in the next 60 days.  [facility]N Long Term Goal Start Date  11/28/17  Interventions for Problem One Long Term Goal  Encouraged patient to notify the physician of her symptoms, discussed importance of monitoring her blood pressure at times of her symptoms, reviewed medications and encouraged compliance, discussed [employer] quartely allowances to purchase over the counter materials and obtaining a new blood pressure cuff to monitor blood pressures at home, encouraged patient to ask neighbor to help monitor her blood pressure until she can obtain new machine, encouraged patient to contact physician for sooner appointment  [facility] CM Short Term Goal #1   Patient will weigh herself daily within the next 30 days.  [facility]N CM Short Term Goal #1 Start Date  11/28/17  Interventions for Short Term Goal #1  Reviewed importance of daily weigh monitoring, encouraged patient to weigh herself daily, reviewed signs and symptoms of fluid retention and when to call the physician based on weight and retention  [facility] CM Short Term Goal #2   Patient will report taking her blood pressure daily in the next 30 days.  [facility]N CM Short Term Goal #2 Start Date  11/28/17  Interventions for Short  Term Goal #2  Reviewed signs and symptoms of hypotension, encouraged patient to monitor blood pressures daily and record readings, encouraged patient to take recorded readings to medical appointments for directions for medications, encouraged patient to contact primary care provider for sustained hypotension for appointment to be seen, reviewed normal blood pressure ranges with patient (SBP should be >110), fall precautions reviewed and discussed  [facility] CM Short Term Goal #3  Patient will report no falls in the next 30 days.  [facility]N CM Short Term Goal #3 Start Date  09/14/17  [facility] CM Short Term Goal #3 Met Date  11/28/17     Appointments:  Patient reports next medical appointment with primary care provider is 12/26/2017.  Encouraged to contact Dr. Baird Cancer and request a sooner appointment with her symptoms.  Has scheduled Cardiology appointment with Dr. Stanford Breed on 12/05/2017.  Plan:   RN Health Coach will make next telephone outreach to patient in the month of November. RN Health Coach will send primary care provider Quarterly Update.  [place] [phone] [email or web address]

## 2017-12-05 ENCOUNTER — Ambulatory Visit: Payer: Medicare HMO | Admitting: Cardiology

## 2017-12-05 DIAGNOSIS — R0989 Other specified symptoms and signs involving the circulatory and respiratory systems: Secondary | ICD-10-CM

## 2017-12-06 ENCOUNTER — Encounter: Payer: Self-pay | Admitting: *Deleted

## 2017-12-12 ENCOUNTER — Other Ambulatory Visit: Payer: Self-pay | Admitting: *Deleted

## 2017-12-12 NOTE — Patient Outreach (Signed)
[place] [facility]) Care Management  12/12/2017  Carrie Key [birth year] [id number]   RN Health Coach Monthly Outreach  Referral Date:06/21/2017 Referral Source:Telephonic Screening Reason for Referral:Disease Management Education Insurance:[employer] Medicare   Outreach Attempt:  Outreach attempt #1 to patient for monthly follow up.  Patient answered and stated she did not feel well, requested a telephone call back.  Plan:  RN Health Coach will make another outreach attempt within the month of November.  [place] [phone] [email or web address]

## 2017-12-19 ENCOUNTER — Inpatient Hospital Stay (HOSPITAL_COMMUNITY)
Admission: EM | Admit: 2017-12-19 | Discharge: 2018-01-07 | DRG: 023 | Disposition: E | Payer: Medicare HMO | Attending: Neurology | Admitting: Neurology

## 2017-12-19 ENCOUNTER — Inpatient Hospital Stay (HOSPITAL_COMMUNITY): Payer: Medicare HMO | Admitting: Anesthesiology

## 2017-12-19 ENCOUNTER — Inpatient Hospital Stay (HOSPITAL_COMMUNITY): Payer: Medicare HMO

## 2017-12-19 ENCOUNTER — Emergency Department (HOSPITAL_COMMUNITY): Payer: Medicare HMO

## 2017-12-19 ENCOUNTER — Encounter (HOSPITAL_COMMUNITY): Admission: EM | Disposition: E | Payer: Self-pay | Source: Home / Self Care | Attending: Neurology

## 2017-12-19 ENCOUNTER — Telehealth (HOSPITAL_COMMUNITY): Payer: Self-pay | Admitting: *Deleted

## 2017-12-19 ENCOUNTER — Encounter (HOSPITAL_COMMUNITY): Payer: Self-pay | Admitting: Emergency Medicine

## 2017-12-19 DIAGNOSIS — I482 Chronic atrial fibrillation, unspecified: Secondary | ICD-10-CM | POA: Diagnosis not present

## 2017-12-19 DIAGNOSIS — R9431 Abnormal electrocardiogram [ECG] [EKG]: Secondary | ICD-10-CM

## 2017-12-19 DIAGNOSIS — Z87891 Personal history of nicotine dependence: Secondary | ICD-10-CM

## 2017-12-19 DIAGNOSIS — J9811 Atelectasis: Secondary | ICD-10-CM | POA: Diagnosis not present

## 2017-12-19 DIAGNOSIS — D62 Acute posthemorrhagic anemia: Secondary | ICD-10-CM | POA: Diagnosis not present

## 2017-12-19 DIAGNOSIS — R001 Bradycardia, unspecified: Secondary | ICD-10-CM | POA: Diagnosis present

## 2017-12-19 DIAGNOSIS — G459 Transient cerebral ischemic attack, unspecified: Secondary | ICD-10-CM | POA: Diagnosis not present

## 2017-12-19 DIAGNOSIS — R5381 Other malaise: Secondary | ICD-10-CM | POA: Diagnosis present

## 2017-12-19 DIAGNOSIS — Z515 Encounter for palliative care: Secondary | ICD-10-CM | POA: Diagnosis not present

## 2017-12-19 DIAGNOSIS — D631 Anemia in chronic kidney disease: Secondary | ICD-10-CM | POA: Diagnosis present

## 2017-12-19 DIAGNOSIS — E870 Hyperosmolality and hypernatremia: Secondary | ICD-10-CM | POA: Diagnosis not present

## 2017-12-19 DIAGNOSIS — G936 Cerebral edema: Secondary | ICD-10-CM | POA: Diagnosis not present

## 2017-12-19 DIAGNOSIS — Z90710 Acquired absence of both cervix and uterus: Secondary | ICD-10-CM

## 2017-12-19 DIAGNOSIS — N183 Chronic kidney disease, stage 3 unspecified: Secondary | ICD-10-CM | POA: Diagnosis present

## 2017-12-19 DIAGNOSIS — I6789 Other cerebrovascular disease: Secondary | ICD-10-CM | POA: Diagnosis not present

## 2017-12-19 DIAGNOSIS — E785 Hyperlipidemia, unspecified: Secondary | ICD-10-CM | POA: Diagnosis present

## 2017-12-19 DIAGNOSIS — E1122 Type 2 diabetes mellitus with diabetic chronic kidney disease: Secondary | ICD-10-CM | POA: Diagnosis present

## 2017-12-19 DIAGNOSIS — J9600 Acute respiratory failure, unspecified whether with hypoxia or hypercapnia: Secondary | ICD-10-CM | POA: Diagnosis not present

## 2017-12-19 DIAGNOSIS — I63412 Cerebral infarction due to embolism of left middle cerebral artery: Secondary | ICD-10-CM | POA: Diagnosis not present

## 2017-12-19 DIAGNOSIS — N179 Acute kidney failure, unspecified: Secondary | ICD-10-CM | POA: Diagnosis present

## 2017-12-19 DIAGNOSIS — I6340 Cerebral infarction due to embolism of unspecified cerebral artery: Secondary | ICD-10-CM | POA: Diagnosis not present

## 2017-12-19 DIAGNOSIS — Z6837 Body mass index (BMI) 37.0-37.9, adult: Secondary | ICD-10-CM

## 2017-12-19 DIAGNOSIS — I350 Nonrheumatic aortic (valve) stenosis: Secondary | ICD-10-CM

## 2017-12-19 DIAGNOSIS — R404 Transient alteration of awareness: Secondary | ICD-10-CM | POA: Diagnosis not present

## 2017-12-19 DIAGNOSIS — R4701 Aphasia: Secondary | ICD-10-CM | POA: Diagnosis present

## 2017-12-19 DIAGNOSIS — K219 Gastro-esophageal reflux disease without esophagitis: Secondary | ICD-10-CM | POA: Diagnosis present

## 2017-12-19 DIAGNOSIS — Z853 Personal history of malignant neoplasm of breast: Secondary | ICD-10-CM

## 2017-12-19 DIAGNOSIS — I6522 Occlusion and stenosis of left carotid artery: Secondary | ICD-10-CM | POA: Diagnosis not present

## 2017-12-19 DIAGNOSIS — I480 Paroxysmal atrial fibrillation: Secondary | ICD-10-CM | POA: Diagnosis present

## 2017-12-19 DIAGNOSIS — G935 Compression of brain: Secondary | ICD-10-CM | POA: Diagnosis not present

## 2017-12-19 DIAGNOSIS — G8191 Hemiplegia, unspecified affecting right dominant side: Secondary | ICD-10-CM | POA: Diagnosis not present

## 2017-12-19 DIAGNOSIS — Z66 Do not resuscitate: Secondary | ICD-10-CM | POA: Diagnosis present

## 2017-12-19 DIAGNOSIS — I080 Rheumatic disorders of both mitral and aortic valves: Secondary | ICD-10-CM | POA: Diagnosis present

## 2017-12-19 DIAGNOSIS — I340 Nonrheumatic mitral (valve) insufficiency: Secondary | ICD-10-CM | POA: Diagnosis present

## 2017-12-19 DIAGNOSIS — R05 Cough: Secondary | ICD-10-CM | POA: Diagnosis not present

## 2017-12-19 DIAGNOSIS — E669 Obesity, unspecified: Secondary | ICD-10-CM | POA: Diagnosis present

## 2017-12-19 DIAGNOSIS — R29818 Other symptoms and signs involving the nervous system: Secondary | ICD-10-CM | POA: Diagnosis not present

## 2017-12-19 DIAGNOSIS — E1151 Type 2 diabetes mellitus with diabetic peripheral angiopathy without gangrene: Secondary | ICD-10-CM | POA: Diagnosis present

## 2017-12-19 DIAGNOSIS — G4489 Other headache syndrome: Secondary | ICD-10-CM | POA: Diagnosis not present

## 2017-12-19 DIAGNOSIS — I619 Nontraumatic intracerebral hemorrhage, unspecified: Secondary | ICD-10-CM | POA: Diagnosis not present

## 2017-12-19 DIAGNOSIS — Z823 Family history of stroke: Secondary | ICD-10-CM

## 2017-12-19 DIAGNOSIS — Z4659 Encounter for fitting and adjustment of other gastrointestinal appliance and device: Secondary | ICD-10-CM

## 2017-12-19 DIAGNOSIS — E161 Other hypoglycemia: Secondary | ICD-10-CM | POA: Diagnosis not present

## 2017-12-19 DIAGNOSIS — Z4682 Encounter for fitting and adjustment of non-vascular catheter: Secondary | ICD-10-CM | POA: Diagnosis not present

## 2017-12-19 DIAGNOSIS — R29725 NIHSS score 25: Secondary | ICD-10-CM | POA: Diagnosis present

## 2017-12-19 DIAGNOSIS — I712 Thoracic aortic aneurysm, without rupture: Secondary | ICD-10-CM | POA: Diagnosis present

## 2017-12-19 DIAGNOSIS — R4781 Slurred speech: Secondary | ICD-10-CM | POA: Diagnosis not present

## 2017-12-19 DIAGNOSIS — Z7289 Other problems related to lifestyle: Secondary | ICD-10-CM

## 2017-12-19 DIAGNOSIS — I6602 Occlusion and stenosis of left middle cerebral artery: Secondary | ICD-10-CM | POA: Diagnosis present

## 2017-12-19 DIAGNOSIS — Z79899 Other long term (current) drug therapy: Secondary | ICD-10-CM

## 2017-12-19 DIAGNOSIS — I129 Hypertensive chronic kidney disease with stage 1 through stage 4 chronic kidney disease, or unspecified chronic kidney disease: Secondary | ICD-10-CM | POA: Diagnosis not present

## 2017-12-19 DIAGNOSIS — I63133 Cerebral infarction due to embolism of bilateral carotid arteries: Secondary | ICD-10-CM | POA: Diagnosis not present

## 2017-12-19 DIAGNOSIS — Z978 Presence of other specified devices: Secondary | ICD-10-CM

## 2017-12-19 DIAGNOSIS — R059 Cough, unspecified: Secondary | ICD-10-CM

## 2017-12-19 DIAGNOSIS — Z8673 Personal history of transient ischemic attack (TIA), and cerebral infarction without residual deficits: Secondary | ICD-10-CM

## 2017-12-19 DIAGNOSIS — Z8249 Family history of ischemic heart disease and other diseases of the circulatory system: Secondary | ICD-10-CM

## 2017-12-19 DIAGNOSIS — I639 Cerebral infarction, unspecified: Secondary | ICD-10-CM | POA: Diagnosis present

## 2017-12-19 DIAGNOSIS — E162 Hypoglycemia, unspecified: Secondary | ICD-10-CM | POA: Diagnosis not present

## 2017-12-19 DIAGNOSIS — Z9010 Acquired absence of unspecified breast and nipple: Secondary | ICD-10-CM

## 2017-12-19 DIAGNOSIS — Z9221 Personal history of antineoplastic chemotherapy: Secondary | ICD-10-CM

## 2017-12-19 DIAGNOSIS — E1129 Type 2 diabetes mellitus with other diabetic kidney complication: Secondary | ICD-10-CM

## 2017-12-19 DIAGNOSIS — Z973 Presence of spectacles and contact lenses: Secondary | ICD-10-CM

## 2017-12-19 DIAGNOSIS — E876 Hypokalemia: Secondary | ICD-10-CM | POA: Diagnosis present

## 2017-12-19 DIAGNOSIS — Z7901 Long term (current) use of anticoagulants: Secondary | ICD-10-CM

## 2017-12-19 HISTORY — PX: IR CT HEAD LTD: IMG2386

## 2017-12-19 HISTORY — PX: RADIOLOGY WITH ANESTHESIA: SHX6223

## 2017-12-19 HISTORY — PX: IR PERCUTANEOUS ART THROMBECTOMY/INFUSION INTRACRANIAL INC DIAG ANGIO: IMG6087

## 2017-12-19 LAB — COMPREHENSIVE METABOLIC PANEL
ALBUMIN: 3.8 g/dL (ref 3.5–5.0)
ALK PHOS: 74 U/L (ref 38–126)
ALT: 10 U/L (ref 0–44)
ANION GAP: 14 (ref 5–15)
AST: 18 U/L (ref 15–41)
BILIRUBIN TOTAL: 0.7 mg/dL (ref 0.3–1.2)
BUN: 29 mg/dL — ABNORMAL HIGH (ref 8–23)
CO2: 15 mmol/L — ABNORMAL LOW (ref 22–32)
CREATININE: 2.2 mg/dL — AB (ref 0.44–1.00)
Calcium: 9.1 mg/dL (ref 8.9–10.3)
Chloride: 104 mmol/L (ref 98–111)
GFR calc Af Amer: 24 mL/min — ABNORMAL LOW (ref >60)
GFR calc non Af Amer: 21 mL/min — ABNORMAL LOW (ref >60)
Glucose, Bld: 82 mg/dL (ref 70–99)
Potassium: 3.7 mmol/L (ref 3.5–5.1)
Sodium: 133 mmol/L — ABNORMAL LOW (ref 135–145)
TOTAL PROTEIN: 7.5 g/dL (ref 6.5–8.1)

## 2017-12-19 LAB — BLOOD GAS, ARTERIAL
Acid-base deficit: 3.7 mmol/L — ABNORMAL HIGH (ref 0.0–2.0)
Bicarbonate: 20.7 mmol/L (ref 20.0–28.0)
DRAWN BY: 44166
FIO2: 60
LHR: 16 {breaths}/min
MECHVT: 470 mL
O2 Saturation: 99.1 %
PATIENT TEMPERATURE: 98.6
PCO2 ART: 36.4 mmHg (ref 32.0–48.0)
PEEP: 5 cmH2O
PH ART: 7.373 (ref 7.350–7.450)
PO2 ART: 199 mmHg — AB (ref 83.0–108.0)

## 2017-12-19 LAB — DIFFERENTIAL
Abs Immature Granulocytes: 0.04 10*3/uL (ref 0.00–0.07)
Basophils Absolute: 0.1 10*3/uL (ref 0.0–0.1)
Basophils Relative: 1 %
EOS PCT: 1 %
Eosinophils Absolute: 0.1 10*3/uL (ref 0.0–0.5)
Immature Granulocytes: 1 %
LYMPHS ABS: 1.3 10*3/uL (ref 0.7–4.0)
Lymphocytes Relative: 16 %
MONO ABS: 0.5 10*3/uL (ref 0.1–1.0)
Monocytes Relative: 7 %
NEUTROS ABS: 5.9 10*3/uL (ref 1.7–7.7)
Neutrophils Relative %: 74 %

## 2017-12-19 LAB — CBC
HCT: 32.3 % — ABNORMAL LOW (ref 36.0–46.0)
HEMOGLOBIN: 9.7 g/dL — AB (ref 12.0–15.0)
MCH: 24.7 pg — ABNORMAL LOW (ref 26.0–34.0)
MCHC: 30 g/dL (ref 30.0–36.0)
MCV: 82.2 fL (ref 80.0–100.0)
Platelets: 250 10*3/uL (ref 150–400)
RBC: 3.93 MIL/uL (ref 3.87–5.11)
RDW: 20.3 % — ABNORMAL HIGH (ref 11.5–15.5)
WBC: 7.8 10*3/uL (ref 4.0–10.5)
nRBC: 0 % (ref 0.0–0.2)

## 2017-12-19 LAB — I-STAT CHEM 8, ED
BUN: 29 mg/dL — ABNORMAL HIGH (ref 8–23)
CALCIUM ION: 1.12 mmol/L — AB (ref 1.15–1.40)
CHLORIDE: 104 mmol/L (ref 98–111)
CREATININE: 2.3 mg/dL — AB (ref 0.44–1.00)
GLUCOSE: 82 mg/dL (ref 70–99)
HCT: 32 % — ABNORMAL LOW (ref 36.0–46.0)
Hemoglobin: 10.9 g/dL — ABNORMAL LOW (ref 12.0–15.0)
Potassium: 3.7 mmol/L (ref 3.5–5.1)
Sodium: 135 mmol/L (ref 135–145)
TCO2: 22 mmol/L (ref 22–32)

## 2017-12-19 LAB — GLUCOSE, CAPILLARY
Glucose-Capillary: 72 mg/dL (ref 70–99)
Glucose-Capillary: 88 mg/dL (ref 70–99)

## 2017-12-19 LAB — I-STAT TROPONIN, ED: Troponin i, poc: 0.03 ng/mL (ref 0.00–0.08)

## 2017-12-19 LAB — PROTIME-INR
INR: 1.08
PROTHROMBIN TIME: 13.9 s (ref 11.4–15.2)

## 2017-12-19 LAB — TRIGLYCERIDES: TRIGLYCERIDES: 154 mg/dL — AB (ref <150)

## 2017-12-19 LAB — APTT: aPTT: 32 seconds (ref 24–36)

## 2017-12-19 SURGERY — IR WITH ANESTHESIA
Anesthesia: Choice

## 2017-12-19 MED ORDER — ACETAMINOPHEN 650 MG RE SUPP: 650.0000 mg | RECTAL | Status: DC | PRN

## 2017-12-19 MED ORDER — PROPOFOL 1000 MG/100ML IV EMUL
5.0000 ug/kg/min | INTRAVENOUS | Status: DC
  Administered 2017-12-19: 20 ug/kg/min via INTRAVENOUS
  Administered 2017-12-20: 30 ug/kg/min via INTRAVENOUS
  Administered 2017-12-20 (×2): 35 ug/kg/min via INTRAVENOUS
  Filled 2017-12-19 (×3): qty 100

## 2017-12-19 MED ORDER — ROCURONIUM BROMIDE 10 MG/ML (PF) SYRINGE
PREFILLED_SYRINGE | INTRAVENOUS | Status: DC | PRN
  Administered 2017-12-19 (×3): 50 mg via INTRAVENOUS

## 2017-12-19 MED ORDER — IOHEXOL 300 MG/ML  SOLN
150.0000 mL | Freq: Once | INTRAMUSCULAR | Status: AC | PRN
  Administered 2017-12-19: 40 mL via INTRA_ARTERIAL

## 2017-12-19 MED ORDER — STROKE: EARLY STAGES OF RECOVERY BOOK
Freq: Once | Status: DC
  Filled 2017-12-19: qty 1

## 2017-12-19 MED ORDER — LIDOCAINE 2% (20 MG/ML) 5 ML SYRINGE
INTRAMUSCULAR | Status: DC | PRN
  Administered 2017-12-19: 60 mg via INTRAVENOUS

## 2017-12-19 MED ORDER — HYDRALAZINE HCL 20 MG/ML IJ SOLN
INTRAMUSCULAR | Status: AC
  Filled 2017-12-19: qty 1

## 2017-12-19 MED ORDER — FAMOTIDINE IN NACL 20-0.9 MG/50ML-% IV SOLN
20.0000 mg | INTRAVENOUS | Status: DC
  Administered 2017-12-19 – 2017-12-20 (×2): 20 mg via INTRAVENOUS
  Filled 2017-12-19 (×2): qty 50

## 2017-12-19 MED ORDER — INSULIN ASPART 100 UNIT/ML ~~LOC~~ SOLN: 0.0000 [IU] | SUBCUTANEOUS | Status: DC

## 2017-12-19 MED ORDER — ASPIRIN 325 MG PO TABS
ORAL_TABLET | ORAL | Status: AC
  Filled 2017-12-19: qty 1

## 2017-12-19 MED ORDER — PROPOFOL 10 MG/ML IV BOLUS
INTRAVENOUS | Status: DC | PRN
  Administered 2017-12-19: 50 mg via INTRAVENOUS
  Administered 2017-12-19: 200 mg via INTRAVENOUS
  Administered 2017-12-19: 50 mg via INTRAVENOUS

## 2017-12-19 MED ORDER — CLEVIDIPINE BUTYRATE 0.5 MG/ML IV EMUL
0.0000 mg/h | INTRAVENOUS | Status: DC
  Administered 2017-12-19: 8 mg/h via INTRAVENOUS
  Administered 2017-12-19: 2 mg/h via INTRAVENOUS
  Administered 2017-12-20: 8 mg/h via INTRAVENOUS
  Administered 2017-12-20: 20 mg/h via INTRAVENOUS
  Administered 2017-12-20: 9 mg/h via INTRAVENOUS
  Administered 2017-12-20 (×2): 18 mg/h via INTRAVENOUS
  Administered 2017-12-20: 5 mg/h via INTRAVENOUS
  Administered 2017-12-21: 20 mg/h via INTRAVENOUS
  Administered 2017-12-21: 21 mg/h via INTRAVENOUS
  Administered 2017-12-21: 20 mg/h via INTRAVENOUS
  Administered 2017-12-21: 21 mg/h via INTRAVENOUS
  Filled 2017-12-19 (×13): qty 50

## 2017-12-19 MED ORDER — FENTANYL CITRATE (PF) 100 MCG/2ML IJ SOLN
INTRAMUSCULAR | Status: AC
  Filled 2017-12-19: qty 2

## 2017-12-19 MED ORDER — SODIUM CHLORIDE 0.9 % IV SOLN
INTRAVENOUS | Status: DC
  Administered 2017-12-19: 21:00:00 via INTRAVENOUS

## 2017-12-19 MED ORDER — CLOPIDOGREL BISULFATE 300 MG PO TABS
ORAL_TABLET | ORAL | Status: AC
  Filled 2017-12-19: qty 1

## 2017-12-19 MED ORDER — ALBUTEROL SULFATE (2.5 MG/3ML) 0.083% IN NEBU
2.5000 mg | INHALATION_SOLUTION | Freq: Four times a day (QID) | RESPIRATORY_TRACT | Status: DC
  Administered 2017-12-20 (×4): 2.5 mg via RESPIRATORY_TRACT
  Filled 2017-12-19 (×4): qty 3

## 2017-12-19 MED ORDER — IOPAMIDOL (ISOVUE-370) INJECTION 76%
100.0000 mL | Freq: Once | INTRAVENOUS | Status: AC | PRN
  Administered 2017-12-19: 100 mL via INTRAVENOUS

## 2017-12-19 MED ORDER — LIDOCAINE HCL 1 % IJ SOLN
INTRAMUSCULAR | Status: AC
  Filled 2017-12-19: qty 20

## 2017-12-19 MED ORDER — LABETALOL HCL 5 MG/ML IV SOLN: 10.0000 mg | Freq: Once | INTRAVENOUS | Status: DC

## 2017-12-19 MED ORDER — GLYCOPYRROLATE PF 0.2 MG/ML IJ SOSY
PREFILLED_SYRINGE | INTRAMUSCULAR | Status: DC | PRN
  Administered 2017-12-19 (×2): .2 mg via INTRAVENOUS

## 2017-12-19 MED ORDER — ORAL CARE MOUTH RINSE
15.0000 mL | OROMUCOSAL | Status: DC
  Administered 2017-12-19 – 2017-12-22 (×19): 15 mL via OROMUCOSAL

## 2017-12-19 MED ORDER — CEFAZOLIN SODIUM-DEXTROSE 2-4 GM/100ML-% IV SOLN
INTRAVENOUS | Status: AC
  Filled 2017-12-19: qty 100

## 2017-12-19 MED ORDER — CHLORHEXIDINE GLUCONATE 0.12% ORAL RINSE (MEDLINE KIT)
15.0000 mL | Freq: Two times a day (BID) | OROMUCOSAL | Status: DC
  Administered 2017-12-19 – 2017-12-21 (×5): 15 mL via OROMUCOSAL

## 2017-12-19 MED ORDER — LACTATED RINGERS IV SOLN
INTRAVENOUS | Status: DC | PRN
  Administered 2017-12-19 (×2): via INTRAVENOUS

## 2017-12-19 MED ORDER — SUCCINYLCHOLINE CHLORIDE 200 MG/10ML IV SOSY
PREFILLED_SYRINGE | INTRAVENOUS | Status: DC | PRN
  Administered 2017-12-19: 100 mg via INTRAVENOUS

## 2017-12-19 MED ORDER — ACETAMINOPHEN 325 MG PO TABS: 650.0000 mg | ORAL_TABLET | ORAL | Status: DC | PRN

## 2017-12-19 MED ORDER — EPHEDRINE SULFATE-NACL 50-0.9 MG/10ML-% IV SOSY
PREFILLED_SYRINGE | INTRAVENOUS | Status: DC | PRN
  Administered 2017-12-19: 5 mg via INTRAVENOUS
  Administered 2017-12-19: 7.5 mg via INTRAVENOUS
  Administered 2017-12-19: 5 mg via INTRAVENOUS

## 2017-12-19 MED ORDER — NITROGLYCERIN 1 MG/10 ML FOR IR/CATH LAB
INTRA_ARTERIAL | Status: AC | PRN
  Administered 2017-12-19 (×2): 25 ug via INTRA_ARTERIAL

## 2017-12-19 MED ORDER — SENNOSIDES-DOCUSATE SODIUM 8.6-50 MG PO TABS: 1.0000 | ORAL_TABLET | Freq: Every evening | ORAL | Status: DC | PRN

## 2017-12-19 MED ORDER — NITROGLYCERIN 1 MG/10 ML FOR IR/CATH LAB
INTRA_ARTERIAL | Status: AC
  Filled 2017-12-19: qty 10

## 2017-12-19 MED ORDER — ACETAMINOPHEN 160 MG/5ML PO SOLN: 650.0000 mg | ORAL | Status: DC | PRN

## 2017-12-19 MED ORDER — CEFAZOLIN SODIUM-DEXTROSE 2-3 GM-%(50ML) IV SOLR
INTRAVENOUS | Status: DC | PRN
  Administered 2017-12-19: 2 g via INTRAVENOUS

## 2017-12-19 MED ORDER — IOHEXOL 300 MG/ML  SOLN
150.0000 mL | Freq: Once | INTRAMUSCULAR | Status: AC | PRN
  Administered 2017-12-19: 50 mL via INTRA_ARTERIAL

## 2017-12-19 MED ORDER — TICAGRELOR 90 MG PO TABS
ORAL_TABLET | ORAL | Status: AC
  Filled 2017-12-19: qty 2

## 2017-12-19 MED ORDER — SODIUM CHLORIDE 0.9 % IV SOLN: INTRAVENOUS | Status: DC

## 2017-12-19 MED ORDER — FENTANYL CITRATE (PF) 250 MCG/5ML IJ SOLN
INTRAMUSCULAR | Status: DC | PRN
  Administered 2017-12-19: 100 ug via INTRAVENOUS
  Administered 2017-12-19 (×2): 25 ug via INTRAVENOUS
  Administered 2017-12-19: 50 ug via INTRAVENOUS

## 2017-12-19 MED ORDER — TIROFIBAN HCL IN NACL 5-0.9 MG/100ML-% IV SOLN
INTRAVENOUS | Status: AC
  Filled 2017-12-19: qty 100

## 2017-12-19 MED ORDER — HYDRALAZINE HCL 20 MG/ML IJ SOLN
20.0000 mg | Freq: Once | INTRAMUSCULAR | Status: AC
  Administered 2017-12-19: 20 mg via INTRAVENOUS

## 2017-12-19 NOTE — Sedation Documentation (Signed)
Spoke with Shirlean Mylar in Patient Placement regarding 4N bed assignment. States the room in being cleaned but cannot give me the room number until the bed has been pinned. Will call 4N Charge.

## 2017-12-19 NOTE — Anesthesia Procedure Notes (Addendum)
Procedure Name: Intubation Date/Time: 12/12/2017 6:28 PM Performed by: Oleta Mouse, MD Pre-anesthesia Checklist: Patient identified, Emergency Drugs available, Suction available and Patient being monitored Patient Re-evaluated:Patient Re-evaluated prior to induction Oxygen Delivery Method: Circle system utilized Preoxygenation: Pre-oxygenation with 100% oxygen Induction Type: IV induction, Rapid sequence and Cricoid Pressure applied Laryngoscope Size: Mac and 3 Grade View: Grade I Tube type: Oral Tube size: 7.0 mm Number of attempts: 1 Airway Equipment and Method: Stylet Placement Confirmation: ETT inserted through vocal cords under direct vision,  positive ETCO2 and breath sounds checked- equal and bilateral Secured at: 21 cm Tube secured with: Tape Dental Injury: Teeth and Oropharynx as per pre-operative assessment

## 2017-12-19 NOTE — Anesthesia Procedure Notes (Signed)
Arterial Line Insertion Start/End11/20/2019 6:00 PM, 12/19/2017 6:02 PM Performed by: Teressa Lower., CRNA, CRNA  Patient location: OR. Preanesthetic checklist: patient identified, IV checked, site marked, risks and benefits discussed, surgical consent, monitors and equipment checked, pre-op evaluation, timeout performed and anesthesia consent Emergency situation Left, radial was placed Catheter size: 20 G Hand hygiene performed , maximum sterile barriers used  and Seldinger technique used Allen's test indicative of satisfactory collateral circulation Attempts: 1 Procedure performed without using ultrasound guided technique. Following insertion, Biopatch and dressing applied. Post procedure assessment: normal  Patient tolerated the procedure well with no immediate complications.

## 2017-12-19 NOTE — Anesthesia Preprocedure Evaluation (Signed)
Anesthesia Evaluation  Patient identified by MRN, date of birth, ID band Patient confused    Reviewed: Unable to perform ROS - Chart review onlyPreop documentation limited or incomplete due to emergent nature of procedure.  History of Anesthesia Complications Negative for: history of anesthetic complications  Airway Mallampati: II  TM Distance: >3 FB Neck ROM: Full    Dental  (+) Partial Upper   Pulmonary shortness of breath, former smoker,    breath sounds clear to auscultation       Cardiovascular hypertension, +CHF  + dysrhythmias Atrial Fibrillation + Valvular Problems/Murmurs MR  Rhythm:Regular     Neuro/Psych CVA    GI/Hepatic GERD  ,  Endo/Other  diabetes, Type 2, Insulin DependentMorbid obesity  Renal/GU CRFRenal disease     Musculoskeletal   Abdominal   Peds  Hematology  (+) anemia ,   Anesthesia Other Findings 4/19 tte: Left ventricle: Systolic function was mildly to moderately   reduced. The estimated ejection fraction was in the range of 40%   to 45%. Diffuse hypokinesis. No evidence of thrombus. - Aortic valve: No evidence of vegetation. There was no   regurgitation. - Mitral valve: There was mild regurgitation. - Left atrium: No evidence of thrombus in the atrial cavity or   appendage. No evidence of thrombus in the atrial cavity or   appendage. No evidence of thrombus in the atrial cavity or   appendage. There was spontaneous echo contrast ("smoke"). - Right ventricle: The cavity size was normal. Wall thickness was   normal. Systolic function was normal. - Right atrium: No evidence of thrombus in the atrial cavity or   appendage. - Tricuspid valve: There was mild regurgitation. - Pulmonic valve: No evidence of vegetation.   Reproductive/Obstetrics                             Anesthesia Physical Anesthesia Plan  ASA: IV and emergent  Anesthesia Plan: General    Post-op Pain Management:    Induction: Intravenous, Rapid sequence and Cricoid pressure planned  PONV Risk Score and Plan: 3 and Treatment may vary due to age or medical condition  Airway Management Planned: Oral ETT  Additional Equipment: Arterial line  Intra-op Plan:   Post-operative Plan: Post-operative intubation/ventilation  Informed Consent:   Only emergency history available and History available from chart only  Plan Discussed with: CRNA and Surgeon  Anesthesia Plan Comments:         Anesthesia Quick Evaluation

## 2017-12-19 NOTE — Progress Notes (Signed)
Patient ID: Carrie Key, female   DOB: [birth year], 77 y.o.   MRN: [id number] INR. Post procedure patient left intubated with RT groin 75F sheath in situ on account of patient being on eliquis. Rt groin soft. No hematoma. Distal pulses all dopplerable in both feet ,unchanged from prior to the procedure. S.Cindy Brindisi MD

## 2017-12-19 NOTE — Sedation Documentation (Signed)
Transport called to bring bed to IR.

## 2017-12-19 NOTE — Procedures (Signed)
S/P Lt common carotid arteriogram followed by complete revascularization of occluded distal  M2 seg of inf division with x 1 pass with trevoprovue 15mm x 24mm retriever device achieving a TICI 3 revascularization.

## 2017-12-19 NOTE — Consult Note (Addendum)
NAME:  Carrie Key, MRN:  [id number], DOB:  March 25, 1940, LOS: 0 ADMISSION DATE:  12/21/2017, CONSULTATION DATE: December 19, 2017 REFERRING MD: December 19, 2017, CHIEF COMPLAINT: Acute CVA  Brief History   77 year old female on Eliquis for atrial fibrillation presented with right-sided weakness and was taken to IR for mechanical thymectomy.  Postoperatively she remained on the ventilator and was transferred to the ICU.  History of present illness   Patient is encephalopathic and/or intubated. Therefore history has been obtained from chart review.  77 year old female with past medical history as below, which is significant for atrial fibrillation on Eliquis and breast cancer status post resection several years ago, CKD, and hypertension.  She was last known well just before 4 PM on 11/12 at which point family witnessed the development of right-sided weakness and slurred speech.  The same findings were confirmed by EMS upon their arrival and a code stroke was activated in route.  Upon arrival to the emergency department she was taken for stat CT of the head with no evidence of acute hemorrhage.  CT angiogram demonstrated a left M1/M2 junction occlusion.  She was not a candidate for systemic TPA due to her Eliquis use.  She was taken to IR for mechanical thrombectomy under Dr. Estanislado Pandy.  Postoperatively she remained intubated and was transferred to the ICU for ventilator and blood pressure management.  Past Medical History  AF on eliquis, breast Ca s/p mastectomy and oral chemo (discharged from oncology 2019), CKD, HTN, HLD.   Significant Hospital Events   11/12: admit, intubated, to IR for thrombectomy.   Consults:    Procedures:  11/12: Left M1/M2 segment mechanical thrombectomy.  11/12 ETT >> 11/12: R fem sheath >> 11/12: Radial art line >>  Significant Diagnostic Tests:  CT head 11/12 > No acute evidence of acute intracranial abnormality. Mild chronic small vessel ischemic  disease and small chronic right frontal infarct. CT angio/perfusion head/neck 11/12 > Proximal left M2 inferior division occlusion. Left temporoparietal core infarct with penumbra as above. Severe attenuation of left MCA superior division branch vessels. Moderate bilateral intracranial ICA stenoses. 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 Widely patent right vertebral artery. Mild carotid atherosclerosis in the neck without stenosis. 4.2 cm ascending aortic aneurysm, stable to at most minimally enlarged compared to 2014 though incompletely imaged today.  Micro Data:    Antimicrobials:  Cefazolin periop   Interim history/subjective:    Objective   Blood pressure (!) 162/95, pulse (!) 53, resp. rate 16, SpO2 100 %.        Intake/Output Summary (Last 24 hours) at 12/23/2017 2035 Last data filed at 12/28/2017 1924 Gross per 24 hour  Intake 1000 ml  Output 20 ml  Net 980 ml   There were no vitals filed for this visit.  Examination: General: Morbdily obese female in NAD on vent HENT: [place]/AT, PERRL, no appreciable JVD Lungs: Clear bilateral breath sounds Cardiovascular: RRR, 3/6 systolic murmur.  Abdomen: Soft, non-tender, non-distended. Small umbilical hernia.  Extremities: No acute deformity Neuro: Sedated post-op. RASS -4.   Resolved Hospital Problem list     Assessment & Plan:   Acute CVA: L MCA. Not a candidate for systemic tpa due to Eliquis. Taken for thrombectomy 11/12. - Management per neurology includes MRI, Echo, A1C, lipid panel - SBP goal < 140 - clevidipine infusion - CBG monitoring and SSI to keep glucose 140-180. - Frequent neuro checks.  - NPO, SLP eval.  - PT/OT once extubated.  Inability to protect airway in the perioperative setting - Full vent support - Propofol for RASS goal -1 to -2 - CXR to confirm ETT placement.  - ABG - SBT once OK by neurology, typically after MRI and sheath out.   Possible aspiration: described by  admitting service.  - CXR, hold ABX for now - Follow WBC/fever curves.   Atrial fibrillation - Holding anticoagulation - Telemetry monitoring.   CKD  Now with AKI. At risk to worsen considering dye load.  - Gentle IVF hydration - Follow BMP   Best practice:  Diet: NPO Pain/Anxiety/Delirium protocol (if indicated): Propofol  VAP protocol (if indicated): per protocol DVT prophylaxis: SCD GI prophylaxis: Pepcid half dose Glucose control: SSI Mobility: BR Code Status: Full Family Communication:  Disposition: ICU  Labs   CBC: Recent Labs  Lab 01/05/2018 1711 12/29/2017 1716  WBC 7.8  --   NEUTROABS 5.9  --   HGB 9.7* 10.9*  HCT 32.3* 32.0*  MCV 82.2  --   PLT 250  --     Basic Metabolic Panel: Recent Labs  Lab 12/11/2017 1711 12/14/2017 1716  NA 133* 135  K 3.7 3.7  CL 104 104  CO2 15*  --   GLUCOSE 82 82  BUN 29* 29*  CREATININE 2.20* 2.30*  CALCIUM 9.1  --    GFR: CrCl cannot be calculated (Unknown ideal weight.). Recent Labs  Lab 12/24/2017 1711  WBC 7.8    Liver Function Tests: Recent Labs  Lab 12/20/2017 1711  AST 18  ALT 10  ALKPHOS 74  BILITOT 0.7  PROT 7.5  ALBUMIN 3.8   No results for input(s): LIPASE, AMYLASE in the last 168 hours. No results for input(s): AMMONIA in the last 168 hours.  ABG    Component Value Date/Time   TCO2 22 12/27/2017 1716     Coagulation Profile: Recent Labs  Lab 01/03/2018 1711  INR 1.08    Cardiac Enzymes: No results for input(s): CKTOTAL, CKMB, CKMBINDEX, TROPONINI in the last 168 hours.  HbA1C: Hgb A1c MFr Bld  Date/Time Value Ref Range Status  04/26/2016 02:49 PM 5.8 (H) 4.8 - 5.6 % Final    Comment:    (NOTE)         Pre-diabetes: 5.7 - 6.4         Diabetes: >6.4         Glycemic control for adults with diabetes: <7.0   11/29/2012 12:00 PM 6.5 (H) <5.7 % Final    Comment:    (NOTE)                                                                       According to the ADA Clinical Practice  Recommendations for 2011, when HbA1c is used as a screening test:  >=6.5%   Diagnostic of Diabetes Mellitus           (if abnormal result is confirmed) 5.7-6.4%   Increased risk of developing Diabetes Mellitus References:Diagnosis and Classification of Diabetes Mellitus,Diabetes RWER,1540,08(QPYPP 1):S62-S69 and Standards of Medical Care in         Diabetes - 2011,Diabetes JKDT,2671,24 (Suppl 1):S11-S61.    CBG: No results for input(s): GLUCAP in the last 168 hours.  Review of Systems:  Unable as patient is encephalopathic and intubated.   Past Medical History  She,  has a past medical history of Atrial fibrillation ([place]), Atrial fibrillation with RVR ([place]) (05/08/2017), Breast cancer ([place]), Cancer ([place]), CKD (chronic kidney disease), stage III ([place]) (07/10/2014), Diabetes mellitus without complication ([place]), Dyspnea, GERD (gastroesophageal reflux disease), Heart murmur, Hyperlipemia, Hypertension, and Wears glasses.   Surgical History    Past Surgical History:  Procedure Laterality Date  . ABDOMINAL HYSTERECTOMY    . BACK SURGERY  2002   lumbar lam  . CARDIAC CATHETERIZATION  2003  . CARDIOVERSION N/A 05/15/2017   Procedure: CARDIOVERSION;  Surgeon: Skeet Latch, MD;  Location: [place];  Service: Cardiovascular;  Laterality: N/A;  . CARDIOVERSION N/A 06/13/2017   Procedure: CARDIOVERSION;  Surgeon: Josue Hector, MD;  Location: [facility] ENDOSCOPY;  Service: Cardiovascular;  Laterality: N/A;  . COLONOSCOPY    . LEFT HEART CATH AND CORONARY ANGIOGRAPHY N/A 05/12/2017   Procedure: LEFT HEART CATH AND CORONARY ANGIOGRAPHY;  Surgeon: Leonie Man, MD;  Location: [place] CV LAB;  Service: Cardiovascular;  Laterality: N/A;  . SIMPLE MASTECTOMY WITH AXILLARY SENTINEL NODE BIOPSY Right 12/03/2012   Procedure: RIGHT SIMPLE MASTECTOMY WITH RIGHT  AXILLARY SENTINEL LYMPH NODE BIOPSY;  Surgeon: Shann Medal, MD;  Location: [place];  Service: General;  Laterality:  Right;  . TEE WITHOUT CARDIOVERSION N/A 05/15/2017   Procedure: TRANSESOPHAGEAL ECHOCARDIOGRAM (TEE);  Surgeon: Skeet Latch, MD;  Location: [place];  Service: Cardiovascular;  Laterality: N/A;  . UPPER GI ENDOSCOPY       Social History   reports that she quit smoking about 29 years ago. Her smoking use included cigarettes. She has a 9.00 pack-year smoking history. She has never used smokeless tobacco. She reports that she drinks about 4.0 standard drinks of alcohol per week. She reports that she does not use drugs.   Family History   Her family history includes Heart attack in her father; Hypertension in her father, mother, and paternal grandmother; Stroke in her mother.   Allergies No Known Allergies   Home Medications  Prior to Admission medications   Medication Sig Start Date End Date Taking? Authorizing Provider  albuterol (PROVENTIL HFA;VENTOLIN HFA) 108 (90 Base) MCG/ACT inhaler Inhale 1-2 puffs into the lungs every 6 (six) hours as needed for wheezing or shortness of breath.    [provider]  amiodarone (PACERONE) 200 MG tablet Take 1 tablet (200 mg total) by mouth daily. 08/03/17   Sherran Needs, NP  amLODipine (NORVASC) 10 MG tablet Take 1 tablet (10 mg total) by mouth daily. 07/20/17 07/20/18  Camnitz, Ocie Doyne, MD  apixaban (ELIQUIS) 5 MG TABS tablet Take 1 tablet (5 mg total) by mouth 2 (two) times daily. 06/21/17   Sherran Needs, NP  carvedilol (COREG) 25 MG tablet Take 1 tablet (25 mg total) by mouth 2 (two) times daily. 05/16/17 09/01/17  Ledora Bottcher, PA  carvedilol (COREG) 25 MG tablet Take 25 mg by mouth 2 (two) times daily with a meal.    [provider]  furosemide (LASIX) 40 MG tablet Take 40 mg by mouth daily.     [provider]  gabapentin (NEURONTIN) 300 MG capsule Take 1 capsule (300 mg total) by mouth at bedtime. Patient taking differently: Take 300 mg by mouth at bedtime as needed (pain).  02/20/17   Magrinat, Virgie Dad, MD  glimepiride (AMARYL) 1 MG tablet Take 1 mg by mouth daily with breakfast.  [provider]  hydrALAZINE (APRESOLINE) 100 MG tablet Take 1 tablet (100 mg total) by mouth every 8 (eight) hours. Patient taking differently: Take 100 mg by mouth daily.  06/02/17   Almyra Deforest, PA  isosorbide dinitrate (ISORDIL) 10 MG tablet Take 10 mg by mouth daily.    [provider]  nitroGLYCERIN (NITROSTAT) 0.4 MG SL tablet Place 1 tablet (0.4 mg total) under the tongue every 5 (five) minutes as needed for chest pain. 05/16/17   Duke, Tami Lin, PA  omeprazole (PRILOSEC) 40 MG capsule Take 40 mg by mouth daily.     Buccini, Robert, MD  ondansetron (ZOFRAN) 4 MG tablet Take 4 mg by mouth every 8 (eight) hours as needed for nausea or vomiting.    [provider]  potassium chloride SA (K-DUR,KLOR-CON) 20 MEQ tablet Take 1 tablet (20 mEq total) by mouth daily. 06/14/17   Sherran Needs, NP  rosuvastatin (CRESTOR) 10 MG tablet Take 1 tablet (10 mg total) by mouth daily. 07/20/17   Camnitz, Ocie Doyne, MD     Critical care time:      Georgann Housekeeper, AGACNP-BC [place] Pager [phone] or [phone]  01/02/2018 9:06 PM

## 2017-12-19 NOTE — Progress Notes (Signed)
Bedside report given to [facility], Therapist, sports.

## 2017-12-19 NOTE — Progress Notes (Signed)
Verbal order obtained from Dr. Estanislado Pandy to NOT place foley catheter in patient.

## 2017-12-19 NOTE — Progress Notes (Signed)
Patient ID: Carrie Key, female   DOB: 11/05/1940, 77 y.o.   MRN: [id number] INR. 87 Y F RH LSW  Acute onset of RT sided weakness and aphasia.mRSS 0. CT Brain No ICH. ASPECTS 10 CTA occluded  Dominant Lt inf division distal M2 seg.  CTP maps  Core vol of 74ml with Tmax> 6.0 65 bml. Mismatch vol 43 ml ,and ratio of 2.0. Patient deemed appropriate  for endovascular revascularization. Procedure,risks,al;ternatives reviewed. Risks of ICH of 10 % ,worsening neuro condition,vent dependency,death and vascular injury all reviewed with son.He expressed understanding of the management plan ,and provided informed witnessed consent over the phone.  S.Suriyah Vergara MD

## 2017-12-19 NOTE — Code Documentation (Signed)
Patient was last known well at 1600 this afternoon.  Code Stroke called via EMS at 1702  Patient arrived at 73.  Stat head CT and labs done.  NIHSS 25  Dr Rory Percy at bedside to assess patient.  18ga IV placed in right AC.  22ga IV placed in left ac. BP 225/95   CTA and CTP done.  10mg  labetalol given IV.  BP 162/95.  12 lead EKG done.  Patient transported to IR at 1740.  Hand off with IR team.

## 2017-12-19 NOTE — Transfer of Care (Signed)
Immediate Anesthesia Transfer of Care Note  Patient: Carrie Key  Procedure(s) Performed: IR WITH ANESTHESIA (N/A )  Patient Location: NICU  Anesthesia Type:General  Level of Consciousness: Patient remains intubated per anesthesia plan  Airway & Oxygen Therapy: Patient remains intubated per anesthesia plan and Patient placed on Ventilator (see vital sign flow sheet for setting)  Post-op Assessment: Report given to RN and Post -op Vital signs reviewed and stable  Post vital signs: Reviewed and stable  Last Vitals:  Vitals Value Taken Time  BP    Temp    Pulse 51 12/14/2017  8:28 PM  Resp 17 12/24/2017  8:28 PM  SpO2 100 % 12/24/2017  8:28 PM  Vitals shown include unvalidated device data.  Last Pain: There were no vitals filed for this visit.       Complications: No apparent anesthesia complications

## 2017-12-19 NOTE — ED Notes (Signed)
Pt in IR. Pedal pulses marked on both feet bi laterally 2+. Groin not needing to be shaved.

## 2017-12-19 NOTE — ED Triage Notes (Addendum)
Pt arrives to ED from home with complaints of R sided weakness, incomprehensible speech, R sided facial droop, and not following directions since 1600 today. EMS reports pt is normally alert and oriented x4 and and has no deficits. Code Stroke has been called.

## 2017-12-19 NOTE — Progress Notes (Signed)
Using A-line/Sheath for BP monitoring and titration of cleviprex gtt.

## 2017-12-19 NOTE — ED Provider Notes (Signed)
[place] NEURO/TRAUMA/SURGICAL ICU Provider Note   CSN: [id number] Arrival date & time:      An emergency department physician performed an initial assessment on this suspected stroke patient at 77.  History   Chief Complaint Chief Complaint  Patient presents with  . Code Stroke    HPI Wafa Laelah Siravo is a 77 y.o. female.   Cerebrovascular Accident  This is a new problem. The current episode started 3 to 5 hours ago. The problem occurs constantly. The problem has been gradually worsening. Pertinent negatives include no chest pain and no shortness of breath. Nothing aggravates the symptoms. Nothing relieves the symptoms. She has tried nothing for the symptoms. The treatment provided no relief.    Past Medical History:  Diagnosis Date  . Atrial fibrillation ([place])   . Atrial fibrillation with RVR ([place]) 05/08/2017  . Breast cancer ([place])   . Cancer ([place])   . CKD (chronic kidney disease), stage III ([place]) 07/10/2014  . Diabetes mellitus without complication ([place])   . Dyspnea   . GERD (gastroesophageal reflux disease)   . Heart murmur   . Hyperlipemia   . Hypertension   . Wears glasses     Patient Active Problem List   Diagnosis Date Noted  . Acute ischemic stroke ([place]) 12/12/2017  . Middle cerebral artery embolism, left 12/25/2017  . Acute renal failure superimposed on stage 3 chronic kidney disease ([place]) 01/02/2018  . Hypertension associated with stage 3 chronic kidney disease due to type 2 diabetes mellitus ([place]) 01/03/2018  . Sinus bradycardia 12/21/2017  . Moderate mitral regurgitation 12/17/2017  . Aortic stenosis, mild 01/03/2018  . Laceration of head 08/27/2017  . Syncope 08/27/2017  . Hypokalemia 08/27/2017  . Acute blood loss anemia 08/27/2017  . QT prolongation 08/27/2017  . Laceration of forehead   . AF (paroxysmal atrial fibrillation) ([place])   . Persistent atrial fibrillation   . Abnormal nuclear stress test 05/12/2017  . Essential hypertension    . Hypertensive urgency   . Type 2 diabetes mellitus with renal manifestations ([place]) 05/07/2017  . CKD (chronic kidney disease), stage III ([place]) 07/10/2014  . Insomnia w/ sleep apnea 09/12/2013  . Snoring 09/12/2013  . Severe obesity (BMI >= 40) ([place]) 09/12/2013  . Neuropathic pain 01/07/2013  . Malignant neoplasm of upper-outer quadrant of right breast in female, estrogen receptor positive ([place]) 11/05/2012  . Hyperlipidemia 04/24/2009  . HYPERTENSION, BENIGN 04/24/2009    Past Surgical History:  Procedure Laterality Date  . ABDOMINAL HYSTERECTOMY    . BACK SURGERY  2002   lumbar lam  . CARDIAC CATHETERIZATION  2003  . CARDIOVERSION N/A 05/15/2017   Procedure: CARDIOVERSION;  Surgeon: Skeet Latch, MD;  Location: [facility];  Service: Cardiovascular;  Laterality: N/A;  . CARDIOVERSION N/A 06/13/2017   Procedure: CARDIOVERSION;  Surgeon: Josue Hector, MD;  Location: [facility] ENDOSCOPY;  Service: Cardiovascular;  Laterality: N/A;  . COLONOSCOPY    . LEFT HEART CATH AND CORONARY ANGIOGRAPHY N/A 05/12/2017   Procedure: LEFT HEART CATH AND CORONARY ANGIOGRAPHY;  Surgeon: Leonie Man, MD;  Location: [facility] CV LAB;  Service: Cardiovascular;  Laterality: N/A;  . SIMPLE MASTECTOMY WITH AXILLARY SENTINEL NODE BIOPSY Right 12/03/2012   Procedure: RIGHT SIMPLE MASTECTOMY WITH RIGHT  AXILLARY SENTINEL LYMPH NODE BIOPSY;  Surgeon: Shann Medal, MD;  Location: [facility];  Service: General;  Laterality: Right;  . TEE WITHOUT CARDIOVERSION N/A 05/15/2017   Procedure: TRANSESOPHAGEAL ECHOCARDIOGRAM (TEE);  Surgeon: Skeet Latch, MD;  Location: [facility] ENDOSCOPY;  Service: Cardiovascular;  Laterality: N/A;  . UPPER GI ENDOSCOPY       OB History   None      Home Medications    Prior to Admission medications   Medication Sig Start Date End Date Taking? Authorizing Provider  albuterol (PROVENTIL HFA;VENTOLIN HFA) 108 (90 Base) MCG/ACT inhaler Inhale 1-2 puffs into the  lungs every 6 (six) hours as needed for wheezing or shortness of breath.    [provider]  amiodarone (PACERONE) 200 MG tablet Take 1 tablet (200 mg total) by mouth daily. 08/03/17   Sherran Needs, NP  amLODipine (NORVASC) 10 MG tablet Take 1 tablet (10 mg total) by mouth daily. 07/20/17 07/20/18  Camnitz, Ocie Doyne, MD  apixaban (ELIQUIS) 5 MG TABS tablet Take 1 tablet (5 mg total) by mouth 2 (two) times daily. 06/21/17   Sherran Needs, NP  carvedilol (COREG) 25 MG tablet Take 1 tablet (25 mg total) by mouth 2 (two) times daily. 05/16/17 09/01/17  Ledora Bottcher, PA  carvedilol (COREG) 25 MG tablet Take 25 mg by mouth 2 (two) times daily with a meal.    [provider]  furosemide (LASIX) 40 MG tablet Take 40 mg by mouth daily.     [provider]  gabapentin (NEURONTIN) 300 MG capsule Take 1 capsule (300 mg total) by mouth at bedtime. Patient taking differently: Take 300 mg by mouth at bedtime as needed (pain).  02/20/17   Magrinat, Virgie Dad, MD  glimepiride (AMARYL) 1 MG tablet Take 1 mg by mouth daily with breakfast.    [provider]  hydrALAZINE (APRESOLINE) 100 MG tablet Take 1 tablet (100 mg total) by mouth every 8 (eight) hours. Patient taking differently: Take 100 mg by mouth daily.  06/02/17   Almyra Deforest, PA  isosorbide dinitrate (ISORDIL) 10 MG tablet Take 10 mg by mouth daily.    [provider]  nitroGLYCERIN (NITROSTAT) 0.4 MG SL tablet Place 1 tablet (0.4 mg total) under the tongue every 5 (five) minutes as needed for chest pain. 05/16/17   Duke, Tami Lin, PA  omeprazole (PRILOSEC) 40 MG capsule Take 40 mg by mouth daily.     Buccini, Robert, MD  ondansetron (ZOFRAN) 4 MG tablet Take 4 mg by mouth every 8 (eight) hours as needed for nausea or vomiting.    [provider]  potassium chloride SA (K-DUR,KLOR-CON) 20 MEQ tablet Take 1 tablet (20 mEq total) by mouth daily. 06/14/17   Sherran Needs, NP  rosuvastatin (CRESTOR)  10 MG tablet Take 1 tablet (10 mg total) by mouth daily. 07/20/17   Camnitz, Ocie Doyne, MD    Family History Family History  Problem Relation Age of Onset  . Hypertension Mother   . Stroke Mother   . Hypertension Father   . Heart attack Father   . Hypertension Paternal Grandmother     Social History Social History   Tobacco Use  . Smoking status: Former Smoker    Packs/day: 0.30    Years: 30.00    Pack years: 9.00    Types: Cigarettes    Last attempt to quit: 11/26/1988    Years since quitting: 29.0  . Smokeless tobacco: Never Used  Substance Use Topics  . Alcohol use: Yes    Alcohol/week: 4.0 standard drinks    Types: 4 Glasses of wine per week    Comment: occ  . Drug use: No     Allergies  Patient has no known allergies.   Review of Systems Review of Systems  Unable to perform ROS: Acuity of condition  Respiratory: Negative for shortness of breath.   Cardiovascular: Negative for chest pain.     Physical Exam Updated Vital Signs BP (!) 148/70   Pulse 66   Resp 20   Ht _0  (1.676 m)   Wt 105 kg   SpO2 100%   BMI 37.36 kg/m   Physical Exam  Constitutional: She appears well-developed and well-nourished.  HENT:  Head: Normocephalic and atraumatic.  Neck: Normal range of motion.  Cardiovascular: Normal rate and regular rhythm.  Pulmonary/Chest: Effort normal and breath sounds normal. No stridor. No respiratory distress.  Abdominal: Soft. She exhibits no distension.  Neurological: She is alert.  a leftward gaze, right-sided weakness and aphasic.  Skin: Skin is warm and dry.  Nursing note and vitals reviewed.    ED Treatments / Results  Labs (all labs ordered are listed, but only abnormal results are displayed) Labs Reviewed  CBC - Abnormal; Notable for the following components:      Result Value   Hemoglobin 9.7 (*)    HCT 32.3 (*)    MCH 24.7 (*)    RDW 20.3 (*)    All other components within normal limits  COMPREHENSIVE METABOLIC  PANEL - Abnormal; Notable for the following components:   Sodium 133 (*)    CO2 15 (*)    BUN 29 (*)    Creatinine, Ser 2.20 (*)    GFR calc non Af Amer 21 (*)    GFR calc Af Amer 24 (*)    All other components within normal limits  BLOOD GAS, ARTERIAL - Abnormal; Notable for the following components:   pO2, Arterial 199 (*)    Acid-base deficit 3.7 (*)    All other components within normal limits  TRIGLYCERIDES - Abnormal; Notable for the following components:   Triglycerides 154 (*)    All other components within normal limits  I-STAT CHEM 8, ED - Abnormal; Notable for the following components:   BUN 29 (*)    Creatinine, Ser 2.30 (*)    Calcium, Ion 1.12 (*)    Hemoglobin 10.9 (*)    HCT 32.0 (*)    All other components within normal limits  MRSA PCR SCREENING  PROTIME-INR  APTT  DIFFERENTIAL  GLUCOSE, CAPILLARY  GLUCOSE, CAPILLARY  HEMOGLOBIN A1C  LIPID PANEL  CBC WITH DIFFERENTIAL/PLATELET  BASIC METABOLIC PANEL  I-STAT TROPONIN, ED    EKG EKG Interpretation  Date/Time:  Tuesday December 19 2017 17:43:20 EST Ventricular Rate:  59 PR Interval:  176 QRS Duration: 112 QT Interval:  480 QTC Calculation: 475 R Axis:   12 Text Interpretation:  Sinus bradycardia Nonspecific ST and T wave abnormality Prolonged QT Abnormal ECG Confirmed by Merrily Pew [phone]) on 12/20/2017 11:49:42 PM   Radiology Ct Angio Head W Or Wo Contrast  Result Date: 01/01/2018 CLINICAL DATA:  Right-sided weakness and slurred speech. EXAM: CT ANGIOGRAPHY HEAD AND NECK CT PERFUSION BRAIN TECHNIQUE: Multidetector CT imaging of the head and neck was performed using the standard protocol during bolus administration of intravenous contrast. Multiplanar CT image reconstructions and MIPs were obtained to evaluate the vascular anatomy. Carotid stenosis measurements (when applicable) are obtained utilizing NASCET criteria, using the distal internal carotid diameter as the denominator. Multiphase CT  imaging of the brain was performed following IV bolus contrast injection. Subsequent parametric perfusion maps were calculated using RAPID software. CONTRAST:  154m ISOVUE-370 IOPAMIDOL (ISOVUE-370) INJECTION 76% COMPARISON:  12/26/2012 chest CT. FINDINGS: CTA NECK FINDINGS Aortic arch: Normal variant aortic arch branching pattern with common origin of the brachiocephalic and left common carotid arteries. Mild arch atherosclerosis. Partially visualized aneurysmal dilatation of the ascending aorta to at least 4.2 cm (4.1 cm at this level on the prior chest CT). Right carotid system: Patent with mild scattered atherosclerotic plaque. No evidence of dissection or stenosis. Tortuous proximal common carotid artery. Retropharyngeal course of the proximal ICA. Left carotid system: Patent with mild scattered atherosclerotic plaque. No evidence of dissection or stenosis. Tortuous proximal common carotid artery. Retropharyngeal course of the proximal ICA and ECA. Vertebral arteries: Widely patent and strongly dominant right vertebral artery. 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 Skeleton: Mild-to-moderate cervical spondylosis. Other neck: Mildly enlarged thyroid gland with bilateral small nodules, similar to the prior chest CT. Upper chest: No apical lung consolidation or mass. Review of the MIP images confirms the above findings CTA HEAD FINDINGS Anterior circulation: The internal carotid arteries are patent from skull base to carotid termini with atherosclerosis resulting in moderate bilateral cavernous and right supraclinoid stenoses. There is no significant M1 stenosis. There is proximal left M2 inferior division occlusion, and there is also asymmetric attenuation of left M2 superior division branch vessels. ACAs are patent with mild bilateral A1 and moderate A2 segment irregularity without flow  limiting proximal stenosis. No aneurysm is identified. Posterior circulation: The intracranial right vertebral artery is patent with nonstenotic atherosclerotic plaque. The left V4 segment is occluded. The basilar artery is patent with mild stenosis proximally. There is a patent right posterior communicating artery. PCAs are patent with distal branch vessel attenuation bilaterally as well as mild left P2 stenoses. No aneurysm is identified. Venous sinuses: As permitted by contrast timing, patent. Anatomic variants: None. Delayed phase: Not performed. Review of the MIP images confirms the above findings CT Brain Perfusion Findings: CBF (<30%) Volume: 273mPerfusion (Tmax>6.0s) volume: 65100mismatch Volume: 38m35mfarction Location:Left parietal and posterior temporal lobes. IMPRESSION: 1. Proximal left M2 inferior division occlusion. 2. Left temporoparietal core infarct with penumbra as above. 3. Severe attenuation of left MCA superior division branch vessels. 4. Moderate bilateral intracranial ICA stenoses. 5. 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 6. Widely patent right vertebral artery. 7. Mild carotid atherosclerosis in the neck without stenosis. 8. 4.2 cm ascending aortic aneurysm, stable to at most minimally enlarged compared to 2014 though incompletely imaged today. Aortic Atherosclerosis (ICD10-I70.0). Aortic aneurysm NOS (ICD10-I71.9). These results were communicated to Dr. ArorRory Percy5:45 pm on 12/18/2017 by text page via the AMIODuke [facility] Endoscopy Centersaging system. Electronically Signed   By: AlleLogan Bores.   On: 12/23/2017 18:05   Ct Angio Neck W Or Wo Contrast  Result Date: 12/18/2017 CLINICAL DATA:  Right-sided weakness and slurred speech. EXAM: CT ANGIOGRAPHY HEAD AND NECK CT PERFUSION BRAIN TECHNIQUE: Multidetector CT imaging of the head and neck was performed using the standard protocol during bolus administration of intravenous contrast. Multiplanar CT image reconstructions and MIPs were  obtained to evaluate the vascular anatomy. Carotid stenosis measurements (when applicable) are obtained utilizing NASCET criteria, using the distal internal carotid diameter as the denominator. Multiphase CT imaging of the brain was performed following IV bolus contrast injection. Subsequent parametric perfusion maps were calculated using RAPID software. CONTRAST:  100mL68mVUE-370 IOPAMIDOL (ISOVUE-370) INJECTION 76% COMPARISON:  12/26/2012 chest CT. FINDINGS: CTA NECK  FINDINGS Aortic arch: Normal variant aortic arch branching pattern with common origin of the brachiocephalic and left common carotid arteries. Mild arch atherosclerosis. Partially visualized aneurysmal dilatation of the ascending aorta to at least 4.2 cm (4.1 cm at this level on the prior chest CT). Right carotid system: Patent with mild scattered atherosclerotic plaque. No evidence of dissection or stenosis. Tortuous proximal common carotid artery. Retropharyngeal course of the proximal ICA. Left carotid system: Patent with mild scattered atherosclerotic plaque. No evidence of dissection or stenosis. Tortuous proximal common carotid artery. Retropharyngeal course of the proximal ICA and ECA. Vertebral arteries: Widely patent and strongly dominant right vertebral artery. 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 Skeleton: Mild-to-moderate cervical spondylosis. Other neck: Mildly enlarged thyroid gland with bilateral small nodules, similar to the prior chest CT. Upper chest: No apical lung consolidation or mass. Review of the MIP images confirms the above findings CTA HEAD FINDINGS Anterior circulation: The internal carotid arteries are patent from skull base to carotid termini with atherosclerosis resulting in moderate bilateral cavernous and right supraclinoid stenoses. There is no significant M1 stenosis. There is proximal left M2  inferior division occlusion, and there is also asymmetric attenuation of left M2 superior division branch vessels. ACAs are patent with mild bilateral A1 and moderate A2 segment irregularity without flow limiting proximal stenosis. No aneurysm is identified. Posterior circulation: The intracranial right vertebral artery is patent with nonstenotic atherosclerotic plaque. The left V4 segment is occluded. The basilar artery is patent with mild stenosis proximally. There is a patent right posterior communicating artery. PCAs are patent with distal branch vessel attenuation bilaterally as well as mild left P2 stenoses. No aneurysm is identified. Venous sinuses: As permitted by contrast timing, patent. Anatomic variants: None. Delayed phase: Not performed. Review of the MIP images confirms the above findings CT Brain Perfusion Findings: CBF (<30%) Volume: 12m Perfusion (Tmax>6.0s) volume: 629mMismatch Volume: 4361mnfarction Location:Left parietal and posterior temporal lobes. IMPRESSION: 1. Proximal left M2 inferior division occlusion. 2. Left temporoparietal core infarct with penumbra as above. 3. Severe attenuation of left MCA superior division branch vessels. 4. Moderate bilateral intracranial ICA stenoses. 5. 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 6. Widely patent right vertebral artery. 7. Mild carotid atherosclerosis in the neck without stenosis. 8. 4.2 cm ascending aortic aneurysm, stable to at most minimally enlarged compared to 2014 though incompletely imaged today. Aortic Atherosclerosis (ICD10-I70.0). Aortic aneurysm NOS (ICD10-I71.9). These results were communicated to Dr. AroRory Percy 5:45 pm on 01/06/2018 by text page via the AMIVa [facility] Systemssaging system. Electronically Signed   By: AllLogan BoresD.   On: 01/03/2018 18:05   Ct Cerebral Perfusion W Contrast  Result Date: 01/03/2018 CLINICAL DATA:  Right-sided weakness and slurred speech. EXAM: CT ANGIOGRAPHY HEAD AND NECK CT PERFUSION  BRAIN TECHNIQUE: Multidetector CT imaging of the head and neck was performed using the standard protocol during bolus administration of intravenous contrast. Multiplanar CT image reconstructions and MIPs were obtained to evaluate the vascular anatomy. Carotid stenosis measurements (when applicable) are obtained utilizing NASCET criteria, using the distal internal carotid diameter as the denominator. Multiphase CT imaging of the brain was performed following IV bolus contrast injection. Subsequent parametric perfusion maps were calculated using RAPID software. CONTRAST:  100m69mOVUE-370 IOPAMIDOL (ISOVUE-370) INJECTION 76% COMPARISON:  12/26/2012 chest CT. FINDINGS: CTA NECK FINDINGS Aortic arch: Normal variant aortic arch branching pattern with common origin of the brachiocephalic and  left common carotid arteries. Mild arch atherosclerosis. Partially visualized aneurysmal dilatation of the ascending aorta to at least 4.2 cm (4.1 cm at this level on the prior chest CT). Right carotid system: Patent with mild scattered atherosclerotic plaque. No evidence of dissection or stenosis. Tortuous proximal common carotid artery. Retropharyngeal course of the proximal ICA. Left carotid system: Patent with mild scattered atherosclerotic plaque. No evidence of dissection or stenosis. Tortuous proximal common carotid artery. Retropharyngeal course of the proximal ICA and ECA. Vertebral arteries: Widely patent and strongly dominant right vertebral artery. 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 Skeleton: Mild-to-moderate cervical spondylosis. Other neck: Mildly enlarged thyroid gland with bilateral small nodules, similar to the prior chest CT. Upper chest: No apical lung consolidation or mass. Review of the MIP images confirms the above findings CTA HEAD FINDINGS Anterior circulation: The internal carotid  arteries are patent from skull base to carotid termini with atherosclerosis resulting in moderate bilateral cavernous and right supraclinoid stenoses. There is no significant M1 stenosis. There is proximal left M2 inferior division occlusion, and there is also asymmetric attenuation of left M2 superior division branch vessels. ACAs are patent with mild bilateral A1 and moderate A2 segment irregularity without flow limiting proximal stenosis. No aneurysm is identified. Posterior circulation: The intracranial right vertebral artery is patent with nonstenotic atherosclerotic plaque. The left V4 segment is occluded. The basilar artery is patent with mild stenosis proximally. There is a patent right posterior communicating artery. PCAs are patent with distal branch vessel attenuation bilaterally as well as mild left P2 stenoses. No aneurysm is identified. Venous sinuses: As permitted by contrast timing, patent. Anatomic variants: None. Delayed phase: Not performed. Review of the MIP images confirms the above findings CT Brain Perfusion Findings: CBF (<30%) Volume: 52m Perfusion (Tmax>6.0s) volume: 635mMismatch Volume: 4311mnfarction Location:Left parietal and posterior temporal lobes. IMPRESSION: 1. Proximal left M2 inferior division occlusion. 2. Left temporoparietal core infarct with penumbra as above. 3. Severe attenuation of left MCA superior division branch vessels. 4. Moderate bilateral intracranial ICA stenoses. 5. 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 6. Widely patent right vertebral artery. 7. Mild carotid atherosclerosis in the neck without stenosis. 8. 4.2 cm ascending aortic aneurysm, stable to at most minimally enlarged compared to 2014 though incompletely imaged today. Aortic Atherosclerosis (ICD10-I70.0). Aortic aneurysm NOS (ICD10-I71.9). These results were communicated to Dr. AroRory Percy 5:45 pm on 12/24/2017 by text page via the AMIS[facility] Plcssaging system. Electronically Signed   By:  AllLogan BoresD.   On: 01/03/2018 18:05   Portable Chest X-ray  Result Date: 12/26/2017 CLINICAL DATA:  Endotracheal tube placement. Orogastric tube placement. EXAM: PORTABLE CHEST 1 VIEW COMPARISON:  Chest radiograph performed 08/27/2017 FINDINGS: The patient's endotracheal tube is seen ending 2-3 cm above the carina. An enteric tube is noted extending below the diaphragm. The lungs are well-aerated. Minimal left basilar atelectasis is noted. There is no evidence of pleural effusion or pneumothorax. The cardiomediastinal silhouette is mildly enlarged. No acute osseous abnormalities are seen. IMPRESSION: 1. Endotracheal tube seen ending 2-3 cm above the carina. 2. Enteric tube noted extending below the diaphragm. 3. Minimal left basilar atelectasis noted; lungs otherwise clear. Mild cardiomegaly. Electronically Signed   By: JefGarald BaldingD.   On: 12/13/2017 22:09   Ct Head Code Stroke Wo Contrast  Result Date: 12/20/2017 CLINICAL DATA:  Code stroke. Right-sided weakness and slurred speech. EXAM: CT HEAD  WITHOUT CONTRAST TECHNIQUE: Contiguous axial images were obtained from the base of the skull through the vertex without intravenous contrast. COMPARISON:  08/26/2017 FINDINGS: Brain: There is no evidence of acute infarct, intracranial hemorrhage, mass, midline shift, or extra-axial fluid collection. A small chronic cortical infarct is again noted in the posterior right frontal lobe. Cerebral white matter hypodensities are similar to the prior study and nonspecific but compatible with mild chronic small vessel ischemic disease. Mild cerebral atrophy is within normal limits for age. Vascular: Calcified atherosclerosis at the skull base. No hyperdense vessel. Skull: No fracture or focal osseous lesion. Sinuses/Orbits: Mild bilateral ethmoid air cell mucosal thickening. Clear mastoid air cells. Unremarkable orbits. Other: None. ASPECTS [facility] Stroke Program Early CT Score) - Ganglionic level infarction  (caudate, lentiform nuclei, internal capsule, insula, M1-M3 cortex): 7 - Supraganglionic infarction (M4-M6 cortex): 3 Total score (0-10 with 10 being normal): 10 IMPRESSION: 1. No acute evidence of acute intracranial abnormality. 2. ASPECTS is 10. 3. Mild chronic small vessel ischemic disease and small chronic right frontal infarct. These results were communicated to Dr. Rory Percy at 5:21 pm on 12/15/2017 by text page via the [facility] messaging system. Electronically Signed   By: Logan Bores M.D.   On: 12/18/2017 17:22    Procedures Procedures (including critical care time)  CRITICAL CARE Performed by: Merrily Pew Total critical care time: 35 minutes Critical care time was exclusive of separately billable procedures and treating other patients. Critical care was necessary to treat or prevent imminent or life-threatening deterioration. Critical care was time spent personally by me on the following activities: development of treatment plan with patient and/or surrogate as well as nursing, discussions with consultants, evaluation of patient's response to treatment, examination of patient, obtaining history from patient or surrogate, ordering and performing treatments and interventions, ordering and review of laboratory studies, ordering and review of radiographic studies, pulse oximetry and re-evaluation of patient's condition.   Medications Ordered in ED Medications  nitroGLYCERIN 100 mcg/mL intra-arterial injection (has no administration in time range)   stroke: mapping our early stages of recovery book (has no administration in time range)  0.9 %  sodium chloride infusion (has no administration in time range)  ceFAZolin (ANCEF) 2-4 GM/100ML-% IVPB (has no administration in time range)  nitroGLYCERIN 1 mg/10 mL (100 mcg/mL) - IR/CATH LAB (25 mcg Intra-arterial Given 12/20/2017 1932)  acetaminophen (TYLENOL) tablet 650 mg (has no administration in time range)    Or  acetaminophen (TYLENOL) solution 650  mg (has no administration in time range)    Or  acetaminophen (TYLENOL) suppository 650 mg (has no administration in time range)  0.9 %  sodium chloride infusion ( Intravenous Rate/Dose Verify 12/30/2017 2200)  clevidipine (CLEVIPREX) infusion 0.5 mg/mL (8 mg/hr Intravenous New Bag/Given 12/24/2017 2344)  hydrALAZINE (APRESOLINE) 20 MG/ML injection (has no administration in time range)  famotidine (PEPCID) IVPB 20 mg premix ( Intravenous Rate/Dose Verify 01/01/2018 2200)  albuterol (PROVENTIL) (2.5 MG/3ML) 0.083% nebulizer solution 2.5 mg (2.5 mg Nebulization Not Given 12/12/2017 2308)  propofol (DIPRIVAN) 1000 MG/100ML infusion (20 mcg/kg/min  105 kg Intravenous New Bag/Given 12/17/2017 2345)  insulin aspart (novoLOG) injection 0-15 Units (0 Units Subcutaneous Not Given 12/11/2017 2346)  senna-docusate (Senokot-S) tablet 1 tablet (has no administration in time range)  chlorhexidine gluconate (MEDLINE KIT) (PERIDEX) 0.12 % solution 15 mL (15 mLs Mouth Rinse Given 12/15/2017 2156)  MEDLINE mouth rinse (15 mLs Mouth Rinse Given 12/15/2017 2156)  iopamidol (ISOVUE-370) 76 % injection 100 mL (100 mLs Intravenous Contrast Given  12/17/2017 1720)  fentaNYL (SUBLIMAZE) 100 MCG/2ML injection (  Override pull for Anesthesia 12/14/2017 1806)  iohexol (OMNIPAQUE) 300 MG/ML solution 150 mL (40 mLs Intra-arterial Contrast Given 12/31/2017 1951)  iohexol (OMNIPAQUE) 300 MG/ML solution 150 mL (50 mLs Intra-arterial Contrast Given 12/20/2017 1951)  fentaNYL (SUBLIMAZE) 100 MCG/2ML injection (  Override pull for Anesthesia 12/31/2017 1920)  hydrALAZINE (APRESOLINE) injection 20 mg (20 mg Intravenous Given 12/23/2017 2032)     Initial Impression / Assessment and Plan / ED Course  I have reviewed the triage vital signs and the nursing notes.  Pertinent labs & imaging results that were available during my care of the patient were reviewed by me and considered in my medical decision making (see chart for details).     Code stroke. To  IR for intervention.   Final Clinical Impressions(s) / ED Diagnoses   Final diagnoses:  Acute embolic stroke ([place])  Cough  Stroke (cerebrum) ([place])  Endotracheal tube present  Encounter for orogastric (OG) tube placement    ED Discharge Orders    None       Twyla Dais, Corene Cornea, MD 12/09/2017 2350

## 2017-12-19 NOTE — Sedation Documentation (Signed)
Spoke with [place], [place], [facility] [facility]. States pt will be going to room 19. Cleaning in progress and can send for the bed in 10 minutes. Requested an O2 tank be placed on the bed.

## 2017-12-19 NOTE — ED Notes (Signed)
Given labetalol 10mg  into 18g IV in RAC. Per MD Rory Percy for high BP.

## 2017-12-19 NOTE — H&P (Signed)
Stroke H&P  CC: Right-sided weakness, slurred speech  History is obtained from: Chart, husband/boyfriend over the phone  HPI: Carrie Key is a 77 y.o. female past medical history of atrial fibrillation on Eliquis, breast cancer status post resection at least 5 years ago, CKD, hyperlipidemia, hypertension, usual state of health with last known normal at 4 PM today when she was cooking and had a sudden onset of right-sided weakness and slurred speech according to the family. EMS was called, she had a leftward gaze, right-sided weakness and was a phasic.  A code stroke-LVO positive was called and patient was brought into the emergency room. She was evaluated on the emergency room bay, found to have symptoms consistent with a left MCA syndrome and taken in for a stat CT of the head.  Stat CT of the head showed no bleed.  Aspect score 10. CT Angie of head and neck showed a left M1/M2 junction occlusion. Favorable perfusion profile and CT perfusion. Spoke with the husband/boyfriend as well as son on the phone.  Son consented for an emergent thrombectomy. She is taken in for interventional radiology. Currently in IR.   LKW: 1600 tpa given?: no, *was within time window but is on Eliquis Premorbid modified Rankin scale (mRS): 0  ROS: Unable to perform due to a aphasia  Past Medical History:  Diagnosis Date  . Atrial fibrillation ([place])   . Atrial fibrillation with RVR ([place]) 05/08/2017  . Breast cancer ([place])   . Cancer ([place])   . CKD (chronic kidney disease), stage III ([place]) 07/10/2014  . Diabetes mellitus without complication ([place])   . Dyspnea   . GERD (gastroesophageal reflux disease)   . Heart murmur   . Hyperlipemia   . Hypertension   . Wears glasses     Family History  Problem Relation Age of Onset  . Hypertension Mother   . Stroke Mother   . Hypertension Father   . Heart attack Father   . Hypertension Paternal Grandmother    Social History:   reports that she quit  smoking about 29 years ago. Her smoking use included cigarettes. She has a 9.00 pack-year smoking history. She has never used smokeless tobacco. She reports that she drinks about 4.0 standard drinks of alcohol per week. She reports that she does not use drugs.  Medications No current facility-administered medications for this encounter.   Current Outpatient Medications:  .  albuterol (PROVENTIL HFA;VENTOLIN HFA) 108 (90 Base) MCG/ACT inhaler, Inhale 1-2 puffs into the lungs every 6 (six) hours as needed for wheezing or shortness of breath., Disp: , Rfl:  .  amiodarone (PACERONE) 200 MG tablet, Take 1 tablet (200 mg total) by mouth daily., Disp: 30 tablet, Rfl: 6 .  amLODipine (NORVASC) 10 MG tablet, Take 1 tablet (10 mg total) by mouth daily., Disp: 30 tablet, Rfl: 6 .  apixaban (ELIQUIS) 5 MG TABS tablet, Take 1 tablet (5 mg total) by mouth 2 (two) times daily., Disp: 60 tablet, Rfl: 6 .  carvedilol (COREG) 25 MG tablet, Take 1 tablet (25 mg total) by mouth 2 (two) times daily., Disp: 180 tablet, Rfl: 3 .  carvedilol (COREG) 25 MG tablet, Take 25 mg by mouth 2 (two) times daily with a meal., Disp: , Rfl:  .  furosemide (LASIX) 40 MG tablet, Take 40 mg by mouth daily. , Disp: , Rfl:  .  gabapentin (NEURONTIN) 300 MG capsule, Take 1 capsule (300 mg total) by mouth at bedtime. (Patient taking differently: Take 300  mg by mouth at bedtime as needed (pain). ), Disp: 30 capsule, Rfl: 6 .  glimepiride (AMARYL) 1 MG tablet, Take 1 mg by mouth daily with breakfast., Disp: , Rfl:  .  hydrALAZINE (APRESOLINE) 100 MG tablet, Take 1 tablet (100 mg total) by mouth every 8 (eight) hours. (Patient taking differently: Take 100 mg by mouth daily. ), Disp: 270 tablet, Rfl: 3 .  isosorbide dinitrate (ISORDIL) 10 MG tablet, Take 10 mg by mouth daily., Disp: , Rfl:  .  nitroGLYCERIN (NITROSTAT) 0.4 MG SL tablet, Place 1 tablet (0.4 mg total) under the tongue every 5 (five) minutes as needed for chest pain., Disp: 25  tablet, Rfl: 1 .  omeprazole (PRILOSEC) 40 MG capsule, Take 40 mg by mouth daily. , Disp: , Rfl:  .  ondansetron (ZOFRAN) 4 MG tablet, Take 4 mg by mouth every 8 (eight) hours as needed for nausea or vomiting., Disp: , Rfl:  .  potassium chloride SA (K-DUR,KLOR-CON) 20 MEQ tablet, Take 1 tablet (20 mEq total) by mouth daily., Disp: 30 tablet, Rfl: 3 .  rosuvastatin (CRESTOR) 10 MG tablet, Take 1 tablet (10 mg total) by mouth daily., Disp: 90 tablet, Rfl: 3  Exam: Current vital signs: BP (!) 162/95 (BP Location: Left Wrist)   Pulse 61   Resp 16   SpO2 98%  Vital signs in last 24 hours: Pulse Rate:  [61-62] 61 (11/12 1735) Resp:  [16-18] 16 (11/12 1735) BP: (162-225)/(95) 162/95 (11/12 1735) SpO2:  [98 %-100 %] 98 % (11/12 1735) General: Patient's awake, alert not following any commands.  In no acute distress HEENT: Normocephalic, atraumatic, dry mucous membranes CVS: S1-S2 heard, regular rate and rhythm.  EKG showed sinus bradycardia. Respiratory: Chest clear to auscultation Abdomen: Obese, non-distended, nontender. Extremities: 1+ pedal edema bilaterally Neurological exam Patient is awake, alert, not following any commands. She is nonverbal. Cranial nerves: Pupils equal round reactive to light, left gaze preference with the inability to cross midline to look at the right, no response to threat from the right, response to threat from the left.  Right lower facial weakness. Motor exam: Flaccid right upper extremity.  Some movement to noxious stim elation in the right lower extremity but cannot raise either of them above the bed.  Left upper and lower extremity-able to raise over the bed without drift. Sensory exam: Decreased sensation to the left based on the grimace and withdrawal to noxious stimulation. Coordination: Cannot be assessed Gait cannot be assessed at this time. NIH stroke scale 1a Level of Conscious.: 0 1b LOC Questions: 2 1c LOC Commands: 2 2 Best Gaze: 1 3 Visual:  2 4 Facial Palsy: 2 5a Motor Arm - left: 0 5b Motor Arm - Right: 3 6a Motor Leg - Left: 0 6b Motor Leg - Right: 2 7 Limb Ataxia: 0 8 Sensory: 1 9 Best Language: 3 10 Dysarthria: 2 11 Extinct. and Inatten.: 1 TOTAL: 22  Labs I have reviewed labs in epic and the results pertinent to this consultation are:  CBC    Component Value Date/Time   WBC 7.4 08/27/2017 0226   RBC 3.21 (L) 08/27/2017 0226   HGB 9.3 (L) 08/27/2017 1200   HGB 12.6 02/20/2017 1239   HGB 10.9 (L) 01/28/2015 1132   HCT 28.2 (L) 08/27/2017 1200   HCT 34.0 (L) 01/28/2015 1132   PLT 213 08/27/2017 0226   PLT 219 02/20/2017 1239   PLT 194 01/28/2015 1132   MCV 91.0 08/27/2017 0226   MCV 92.4  01/28/2015 1132   MCH 29.6 08/27/2017 0226   MCHC 32.5 08/27/2017 0226   RDW 15.1 08/27/2017 0226   RDW 13.9 01/28/2015 1132   LYMPHSABS 1.5 02/20/2017 1239   LYMPHSABS 1.2 01/28/2015 1132   MONOABS 0.5 02/20/2017 1239   MONOABS 0.4 01/28/2015 1132   EOSABS 0.1 02/20/2017 1239   EOSABS 0.2 01/28/2015 1132   BASOSABS 0.0 02/20/2017 1239   BASOSABS 0.0 01/28/2015 1132    CMP     Component Value Date/Time   NA 139 09/01/2017 1139   NA 140 01/28/2015 1132   K 4.1 09/01/2017 1139   K 3.6 01/28/2015 1132   CL 102 09/01/2017 1139   CO2 22 09/01/2017 1139   CO2 25 01/28/2015 1132   GLUCOSE 113 (H) 09/01/2017 1139   GLUCOSE 153 (H) 08/27/2017 0238   GLUCOSE 116 01/28/2015 1132   BUN 14 09/01/2017 1139   BUN 18.9 01/28/2015 1132   CREATININE 1.40 (H) 09/01/2017 1139   CREATININE 1.37 (H) 02/20/2017 1239   CREATININE 1.35 (H) 05/06/2016 1116   CREATININE 1.2 (H) 01/28/2015 1132   CALCIUM 9.6 09/01/2017 1139   CALCIUM 9.6 01/28/2015 1132   PROT 7.7 02/20/2017 1239   PROT 7.3 01/28/2015 1132   ALBUMIN 3.7 02/20/2017 1239   ALBUMIN 3.7 01/28/2015 1132   AST 31 02/20/2017 1239   AST 15 01/28/2015 1132   ALT 19 02/20/2017 1239   ALT <9 01/28/2015 1132   ALKPHOS 92 02/20/2017 1239   ALKPHOS 66 01/28/2015 1132    BILITOT 0.6 02/20/2017 1239   BILITOT 0.49 01/28/2015 1132   GFRNONAA 37 (L) 09/01/2017 1139   GFRNONAA 37 (L) 02/20/2017 1239   GFRNONAA 39 (L) 05/06/2016 1116   GFRAA 42 (L) 09/01/2017 1139   GFRAA 43 (L) 02/20/2017 1239   GFRAA 45 (L) 05/06/2016 1116    Lipid Panel     Component Value Date/Time   CHOL 144 04/27/2016 0510   TRIG 137 04/27/2016 0510   HDL 59 04/27/2016 0510   CHOLHDL 2.4 04/27/2016 0510   VLDL 27 04/27/2016 0510   LDLCALC 58 04/27/2016 0510     Imaging I have reviewed the images obtained: CT-scan of the brain-no acute changes, no bleed.  Aspects 10 CTA head and neck- left M1/inferior division M2 junction occlusion.  CT perfusion shows 22 cc core with 65 cc penumbra.  Assessment:  77 year old woman with a history of atrial fibrillation, modified Rankin score of 0 at baseline completely independent, with sudden onset of right-sided weakness, aphasia, rightward gaze and right facial droop consistent with a left MCA syndrome-last known normal at 4 PM on 01/02/2018 brought in as an acute code stroke. After obtaining imaging to rule out bleeding, discussed with endovascular-interventional radiology for thrombectomy. Cannot get TPA due to Eliquis within the last 48 hours. Candidate for IR-discussed risks and benefits with the son over the phone and consented. Currently in IR.  Plan:  Acute Ischemic Stroke Cerebral infarction due to embolism of left middle cerebral artery   Acuity: Acute Current Suspected Etiology: cardioembolic Continue Evaluation:  -Admit QQ:PYPP -Blood pressure control, goal of SYS <140 if recanalized -MRI/ECHO/A1C/Lipid panel. -Hyperglycemia management per SSI to maintain glucose 140-180mg /dL. -PT/OT/ST therapies and recommendations when able  CNS Cerebral edema Compression of brain -Close neuro monitoring  Dysarthria Dysphagia following cerebral infarction  -NPO until cleared by speech -ST -Advance diet as tolerated -May  need PEG  Hemiplegia and hemiparesis following cerebral infarction affecting right dominant side -PT/OT -PM&R consult  RESP Acute Respiratory Failure  -vent management per ICU -wean when able  CV Essential (primary) hypertension -Aggressive BP control, goal SBP as above -Cleviprex/Labetalol PRN -TTE  Hyperlipidemia, unspecified  - Statin for goal LDL < 70  Chronic atrial fibrillation -Rate control -Repeat CT in 2 weeks for consideration of starting anticoagulation if CT is stable.   HEME Iron Deficiency Anemia -Monitor -transfuse for hgb < 7  Coagulopathy secondary to anticoagulation -monitor  ENDO -goal HgbA1c < 7  GI/GU CKD Stage 4 (GFR 15-29) -Gentle hydration -avoid nephrotoxic agents  Fluid/Electrolyte Disorders -Replete -Repeat labs  ID Possible Aspiration PNA -CXR -NPO -Monitor  Nutrition E66.9 Obesity  -diet consult  Prophylaxis DVT: scf   GI: PPI vent bundle Bowel: doc senna  Diet: NPO until cleared by speech  Code Status: Full Code    THE FOLLOWING WERE PRESENT ON ADMISSION: Acute Ischemic Stroke,  Cerebral Edema,Hemiparesis, CKD,breast ca  Delays in process - reaching family for consent.  -- Amie Portland, MD [facility] Pager: [phone] If 7pm to 7am, please call on call as listed on AMION.   CRITICAL CARE ATTESTATION This patient is critically ill and at significant risk of neurological worsening, death and care requires constant monitoring of vital signs, hemodynamics, respiratory, and cardiac monitoring. I spent 60  minutes of neurocritical care time performing neurological assessment, discussion with family, other specialists and medical decision making of high complexity in the care of  this patient.

## 2017-12-19 NOTE — ED Notes (Signed)
Pt arrived in IR from CT

## 2017-12-20 ENCOUNTER — Inpatient Hospital Stay (HOSPITAL_COMMUNITY): Payer: Medicare HMO

## 2017-12-20 ENCOUNTER — Encounter (HOSPITAL_COMMUNITY): Payer: Self-pay | Admitting: Radiology

## 2017-12-20 ENCOUNTER — Other Ambulatory Visit: Payer: Self-pay | Admitting: *Deleted

## 2017-12-20 DIAGNOSIS — G936 Cerebral edema: Secondary | ICD-10-CM

## 2017-12-20 DIAGNOSIS — I6789 Other cerebrovascular disease: Secondary | ICD-10-CM

## 2017-12-20 DIAGNOSIS — I639 Cerebral infarction, unspecified: Secondary | ICD-10-CM

## 2017-12-20 DIAGNOSIS — I619 Nontraumatic intracerebral hemorrhage, unspecified: Secondary | ICD-10-CM

## 2017-12-20 DIAGNOSIS — I6522 Occlusion and stenosis of left carotid artery: Secondary | ICD-10-CM

## 2017-12-20 DIAGNOSIS — G935 Compression of brain: Secondary | ICD-10-CM

## 2017-12-20 LAB — HEMOGLOBIN A1C
Hgb A1c MFr Bld: 5.8 % — ABNORMAL HIGH (ref 4.8–5.6)
MEAN PLASMA GLUCOSE: 119.76 mg/dL

## 2017-12-20 LAB — CBC WITH DIFFERENTIAL/PLATELET
ABS IMMATURE GRANULOCYTES: 0.03 10*3/uL (ref 0.00–0.07)
Basophils Absolute: 0 10*3/uL (ref 0.0–0.1)
Basophils Relative: 0 %
EOS PCT: 0 %
Eosinophils Absolute: 0 10*3/uL (ref 0.0–0.5)
HCT: 27.8 % — ABNORMAL LOW (ref 36.0–46.0)
HEMOGLOBIN: 8.6 g/dL — AB (ref 12.0–15.0)
Immature Granulocytes: 0 %
LYMPHS PCT: 8 %
Lymphs Abs: 0.6 10*3/uL — ABNORMAL LOW (ref 0.7–4.0)
MCH: 25 pg — ABNORMAL LOW (ref 26.0–34.0)
MCHC: 30.9 g/dL (ref 30.0–36.0)
MCV: 80.8 fL (ref 80.0–100.0)
MONO ABS: 0.3 10*3/uL (ref 0.1–1.0)
MONOS PCT: 4 %
NEUTROS ABS: 6.9 10*3/uL (ref 1.7–7.7)
Neutrophils Relative %: 88 %
Platelets: 222 10*3/uL (ref 150–400)
RBC: 3.44 MIL/uL — ABNORMAL LOW (ref 3.87–5.11)
RDW: 20.3 % — ABNORMAL HIGH (ref 11.5–15.5)
WBC: 7.9 10*3/uL (ref 4.0–10.5)
nRBC: 0 % (ref 0.0–0.2)

## 2017-12-20 LAB — BASIC METABOLIC PANEL
Anion gap: 8 (ref 5–15)
BUN: 21 mg/dL (ref 8–23)
CHLORIDE: 107 mmol/L (ref 98–111)
CO2: 20 mmol/L — AB (ref 22–32)
CREATININE: 1.64 mg/dL — AB (ref 0.44–1.00)
Calcium: 8.4 mg/dL — ABNORMAL LOW (ref 8.9–10.3)
GFR calc Af Amer: 34 mL/min — ABNORMAL LOW (ref >60)
GFR calc non Af Amer: 29 mL/min — ABNORMAL LOW (ref >60)
GLUCOSE: 108 mg/dL — AB (ref 70–99)
POTASSIUM: 3.4 mmol/L — AB (ref 3.5–5.1)
SODIUM: 135 mmol/L (ref 135–145)

## 2017-12-20 LAB — LIPID PANEL
CHOL/HDL RATIO: 2.7 ratio
Cholesterol: 163 mg/dL (ref 0–200)
HDL: 60 mg/dL (ref >40)
LDL Cholesterol: 63 mg/dL (ref 0–99)
TRIGLYCERIDES: 200 mg/dL — AB (ref <150)
VLDL: 40 mg/dL (ref 0–40)

## 2017-12-20 LAB — GLUCOSE, CAPILLARY
GLUCOSE-CAPILLARY: 107 mg/dL — AB (ref 70–99)
GLUCOSE-CAPILLARY: 99 mg/dL (ref 70–99)
Glucose-Capillary: 104 mg/dL — ABNORMAL HIGH (ref 70–99)
Glucose-Capillary: 104 mg/dL — ABNORMAL HIGH (ref 70–99)
Glucose-Capillary: 101 mg/dL — ABNORMAL HIGH (ref 70–99)

## 2017-12-20 LAB — TYPE AND SCREEN
ABO/RH(D): A POS
Antibody Screen: NEGATIVE

## 2017-12-20 LAB — MRSA PCR SCREENING: MRSA by PCR: NEGATIVE

## 2017-12-20 LAB — ECHOCARDIOGRAM COMPLETE
Height: 66 in
Weight: 3703.73 oz

## 2017-12-20 LAB — PHOSPHORUS: Phosphorus: 3.1 mg/dL (ref 2.5–4.6)

## 2017-12-20 LAB — SODIUM
SODIUM: 137 mmol/L (ref 135–145)
Sodium: 143 mmol/L (ref 135–145)

## 2017-12-20 LAB — MAGNESIUM: Magnesium: 1.6 mg/dL — ABNORMAL LOW (ref 1.7–2.4)

## 2017-12-20 MED ORDER — DOCUSATE SODIUM 50 MG/5ML PO LIQD: 100.0000 mg | Freq: Two times a day (BID) | ORAL | Status: DC | PRN

## 2017-12-20 MED ORDER — VITAL HIGH PROTEIN PO LIQD: 1000.0000 mL | ORAL | Status: DC

## 2017-12-20 MED ORDER — FENTANYL CITRATE (PF) 100 MCG/2ML IJ SOLN
INTRAMUSCULAR | Status: AC
  Administered 2017-12-20: 50 ug via INTRAVENOUS
  Filled 2017-12-20: qty 2

## 2017-12-20 MED ORDER — MANNITOL 25 % IV SOLN
INTRAVENOUS | Status: AC
  Administered 2017-12-20: 25 g
  Filled 2017-12-20: qty 50

## 2017-12-20 MED ORDER — ASPIRIN 300 MG RE SUPP: 300.0000 mg | Freq: Every day | RECTAL | Status: DC

## 2017-12-20 MED ORDER — POTASSIUM CHLORIDE 20 MEQ/15ML (10%) PO SOLN
20.0000 meq | Freq: Once | ORAL | Status: AC
  Administered 2017-12-20: 20 meq
  Filled 2017-12-20: qty 15

## 2017-12-20 MED ORDER — BISACODYL 10 MG RE SUPP: 10.0000 mg | Freq: Every day | RECTAL | Status: DC | PRN

## 2017-12-20 MED ORDER — MANNITOL 20 % IV SOLN
25.0000 g | Freq: Once | Status: AC
  Filled 2017-12-20: qty 125

## 2017-12-20 MED ORDER — ASPIRIN 325 MG PO TABS
325.0000 mg | ORAL_TABLET | Freq: Every day | ORAL | Status: DC
  Administered 2017-12-20: 325 mg
  Filled 2017-12-20: qty 1

## 2017-12-20 MED ORDER — PRO-STAT SUGAR FREE PO LIQD
60.0000 mL | Freq: Three times a day (TID) | ORAL | Status: DC
  Administered 2017-12-20: 60 mL
  Filled 2017-12-20: qty 60

## 2017-12-20 MED ORDER — MIDAZOLAM HCL 2 MG/2ML IJ SOLN
1.0000 mg | INTRAMUSCULAR | Status: DC | PRN
  Administered 2017-12-20: 1 mg via INTRAVENOUS
  Filled 2017-12-20: qty 2

## 2017-12-20 MED ORDER — SODIUM CHLORIDE 3 % IV SOLN
INTRAVENOUS | Status: DC
  Administered 2017-12-20 – 2017-12-21 (×3): 75 mL/h via INTRAVENOUS
  Filled 2017-12-20 (×5): qty 500

## 2017-12-20 MED ORDER — FENTANYL CITRATE (PF) 100 MCG/2ML IJ SOLN: 50.0000 ug | INTRAMUSCULAR | Status: DC | PRN

## 2017-12-20 MED ORDER — FENTANYL CITRATE (PF) 100 MCG/2ML IJ SOLN
50.0000 ug | INTRAMUSCULAR | Status: DC | PRN
  Administered 2017-12-20: 50 ug via INTRAVENOUS

## 2017-12-20 MED ORDER — ADULT MULTIVITAMIN LIQUID CH: 15.0000 mL | Freq: Every day | ORAL | Status: DC

## 2017-12-20 NOTE — Progress Notes (Signed)
Referring Physician(s): CODE STROKE- Amie Portland  Supervising Physician: Luanne Bras  Patient Status:  [facility] - In-pt  Chief Complaint: None  Subjective:  Left MCA distal M2 segment occlusion s/p emergent mechanical thrombectomy achieving a TICI 3 revascularization 12/21/2017 by Dr. Estanislado Pandy. Patient laying in bed intubated and sedated. Withdraws from pain in all extremities. Right groin incision c/d/i with sheath in place.   Allergies: Patient has no known allergies.  Medications: Prior to Admission medications   Medication Sig Start Date End Date Taking? Authorizing Provider  albuterol (PROVENTIL HFA;VENTOLIN HFA) 108 (90 Base) MCG/ACT inhaler Inhale 1-2 puffs into the lungs every 6 (six) hours as needed for wheezing or shortness of breath.    [provider]  amiodarone (PACERONE) 200 MG tablet Take 1 tablet (200 mg total) by mouth daily. 08/03/17   Sherran Needs, NP  amLODipine (NORVASC) 10 MG tablet Take 1 tablet (10 mg total) by mouth daily. 07/20/17 07/20/18  Camnitz, Ocie Doyne, MD  apixaban (ELIQUIS) 5 MG TABS tablet Take 1 tablet (5 mg total) by mouth 2 (two) times daily. 06/21/17   Sherran Needs, NP  carvedilol (COREG) 25 MG tablet Take 1 tablet (25 mg total) by mouth 2 (two) times daily. 05/16/17 09/01/17  Ledora Bottcher, PA  carvedilol (COREG) 25 MG tablet Take 25 mg by mouth 2 (two) times daily with a meal.    [provider]  furosemide (LASIX) 40 MG tablet Take 40 mg by mouth daily.     [provider]  gabapentin (NEURONTIN) 300 MG capsule Take 1 capsule (300 mg total) by mouth at bedtime. Patient taking differently: Take 300 mg by mouth at bedtime as needed (pain).  02/20/17   Magrinat, Virgie Dad, MD  glimepiride (AMARYL) 1 MG tablet Take 1 mg by mouth daily with breakfast.    [provider]  hydrALAZINE (APRESOLINE) 100 MG tablet Take 1 tablet (100 mg total) by mouth every 8 (eight) hours. Patient taking  differently: Take 100 mg by mouth daily.  06/02/17   Almyra Deforest, PA  isosorbide dinitrate (ISORDIL) 10 MG tablet Take 10 mg by mouth daily.    [provider]  nitroGLYCERIN (NITROSTAT) 0.4 MG SL tablet Place 1 tablet (0.4 mg total) under the tongue every 5 (five) minutes as needed for chest pain. 05/16/17   Duke, Tami Lin, PA  omeprazole (PRILOSEC) 40 MG capsule Take 40 mg by mouth daily.     Buccini, Robert, MD  ondansetron (ZOFRAN) 4 MG tablet Take 4 mg by mouth every 8 (eight) hours as needed for nausea or vomiting.    [provider]  potassium chloride SA (K-DUR,KLOR-CON) 20 MEQ tablet Take 1 tablet (20 mEq total) by mouth daily. 06/14/17   Sherran Needs, NP  rosuvastatin (CRESTOR) 10 MG tablet Take 1 tablet (10 mg total) by mouth daily. 07/20/17   Camnitz, Ocie Doyne, MD     Vital Signs: BP (!) 112/55   Pulse (!) 51   Temp 97.7 F (36.5 C) (Axillary)   Resp 16   Ht 5\' 6"  (1.676 m)   Wt 231 lb 7.7 oz (105 kg)   SpO2 100%   BMI 37.36 kg/m   Physical Exam  Constitutional: She appears well-developed and well-nourished. No distress.  Intubated and sedated.  Pulmonary/Chest: Effort normal. No respiratory distress.  Intubated and sedated.  Neurological:  Intubated and sedated. Speech and comprehension not assessed. PERRL bilaterally. EOMs not assessed. Visual fields not assessed. No facial  asymmetry. Tongue protrusion not assessed. Withdraws from pain in all extremities. Pronator drift not assessed. Fine motor and coordination not assessed. Gait not assessed. Romberg not assessed. Heel to toe not assessed. Distal pulses palpable bilaterally with Doppler.  Skin: Skin is warm and dry.  Right groin incision soft with sheath intact, no active bleeding or hematoma.  Psychiatric:  Intubated and sedated.  Nursing note and vitals reviewed.   Imaging: Ct Angio Head W Or Wo Contrast  Result Date: 12/23/2017 CLINICAL DATA:  Right-sided weakness and  slurred speech. EXAM: CT ANGIOGRAPHY HEAD AND NECK CT PERFUSION BRAIN TECHNIQUE: Multidetector CT imaging of the head and neck was performed using the standard protocol during bolus administration of intravenous contrast. Multiplanar CT image reconstructions and MIPs were obtained to evaluate the vascular anatomy. Carotid stenosis measurements (when applicable) are obtained utilizing NASCET criteria, using the distal internal carotid diameter as the denominator. Multiphase CT imaging of the brain was performed following IV bolus contrast injection. Subsequent parametric perfusion maps were calculated using RAPID software. CONTRAST:  166mL ISOVUE-370 IOPAMIDOL (ISOVUE-370) INJECTION 76% COMPARISON:  12/26/2012 chest CT. FINDINGS: CTA NECK FINDINGS Aortic arch: Normal variant aortic arch branching pattern with common origin of the brachiocephalic and left common carotid arteries. Mild arch atherosclerosis. Partially visualized aneurysmal dilatation of the ascending aorta to at least 4.2 cm (4.1 cm at this level on the prior chest CT). Right carotid system: Patent with mild scattered atherosclerotic plaque. No evidence of dissection or stenosis. Tortuous proximal common carotid artery. Retropharyngeal course of the proximal ICA. Left carotid system: Patent with mild scattered atherosclerotic plaque. No evidence of dissection or stenosis. Tortuous proximal common carotid artery. Retropharyngeal course of the proximal ICA and ECA. Vertebral arteries: Widely patent and strongly dominant right vertebral artery. 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 Skeleton: Mild-to-moderate cervical spondylosis. Other neck: Mildly enlarged thyroid gland with bilateral small nodules, similar to the prior chest CT. Upper chest: No apical lung consolidation or mass. Review of the MIP images confirms the above findings CTA  HEAD FINDINGS Anterior circulation: The internal carotid arteries are patent from skull base to carotid termini with atherosclerosis resulting in moderate bilateral cavernous and right supraclinoid stenoses. There is no significant M1 stenosis. There is proximal left M2 inferior division occlusion, and there is also asymmetric attenuation of left M2 superior division branch vessels. ACAs are patent with mild bilateral A1 and moderate A2 segment irregularity without flow limiting proximal stenosis. No aneurysm is identified. Posterior circulation: The intracranial right vertebral artery is patent with nonstenotic atherosclerotic plaque. The left V4 segment is occluded. The basilar artery is patent with mild stenosis proximally. There is a patent right posterior communicating artery. PCAs are patent with distal branch vessel attenuation bilaterally as well as mild left P2 stenoses. No aneurysm is identified. Venous sinuses: As permitted by contrast timing, patent. Anatomic variants: None. Delayed phase: Not performed. Review of the MIP images confirms the above findings CT Brain Perfusion Findings: CBF (<30%) Volume: 12mL Perfusion (Tmax>6.0s) volume: 74mL Mismatch Volume: 67mL Infarction Location:Left parietal and posterior temporal lobes. IMPRESSION: 1. Proximal left M2 inferior division occlusion. 2. Left temporoparietal core infarct with penumbra as above. 3. Severe attenuation of left MCA superior division branch vessels. 4. Moderate bilateral intracranial ICA stenoses. 5. 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 6. Widely patent right vertebral artery. 7. Mild carotid atherosclerosis in the neck without stenosis. 8. 4.2 cm  ascending aortic aneurysm, stable to at most minimally enlarged compared to 2014 though incompletely imaged today. Aortic Atherosclerosis (ICD10-I70.0). Aortic aneurysm NOS (ICD10-I71.9). These results were communicated to Dr. Rory Percy at 5:45 pm on 12/08/2017 by text page via  the [facility] messaging system. Electronically Signed   By: Logan Bores M.D.   On: 12/27/2017 18:05   Ct Angio Neck W Or Wo Contrast  Result Date: 12/11/2017 CLINICAL DATA:  Right-sided weakness and slurred speech. EXAM: CT ANGIOGRAPHY HEAD AND NECK CT PERFUSION BRAIN TECHNIQUE: Multidetector CT imaging of the head and neck was performed using the standard protocol during bolus administration of intravenous contrast. Multiplanar CT image reconstructions and MIPs were obtained to evaluate the vascular anatomy. Carotid stenosis measurements (when applicable) are obtained utilizing NASCET criteria, using the distal internal carotid diameter as the denominator. Multiphase CT imaging of the brain was performed following IV bolus contrast injection. Subsequent parametric perfusion maps were calculated using RAPID software. CONTRAST:  143mL ISOVUE-370 IOPAMIDOL (ISOVUE-370) INJECTION 76% COMPARISON:  12/26/2012 chest CT. FINDINGS: CTA NECK FINDINGS Aortic arch: Normal variant aortic arch branching pattern with common origin of the brachiocephalic and left common carotid arteries. Mild arch atherosclerosis. Partially visualized aneurysmal dilatation of the ascending aorta to at least 4.2 cm (4.1 cm at this level on the prior chest CT). Right carotid system: Patent with mild scattered atherosclerotic plaque. No evidence of dissection or stenosis. Tortuous proximal common carotid artery. Retropharyngeal course of the proximal ICA. Left carotid system: Patent with mild scattered atherosclerotic plaque. No evidence of dissection or stenosis. Tortuous proximal common carotid artery. Retropharyngeal course of the proximal ICA and ECA. Vertebral arteries: Widely patent and strongly dominant right vertebral artery. 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 Skeleton: Mild-to-moderate cervical spondylosis. Other  neck: Mildly enlarged thyroid gland with bilateral small nodules, similar to the prior chest CT. Upper chest: No apical lung consolidation or mass. Review of the MIP images confirms the above findings CTA HEAD FINDINGS Anterior circulation: The internal carotid arteries are patent from skull base to carotid termini with atherosclerosis resulting in moderate bilateral cavernous and right supraclinoid stenoses. There is no significant M1 stenosis. There is proximal left M2 inferior division occlusion, and there is also asymmetric attenuation of left M2 superior division branch vessels. ACAs are patent with mild bilateral A1 and moderate A2 segment irregularity without flow limiting proximal stenosis. No aneurysm is identified. Posterior circulation: The intracranial right vertebral artery is patent with nonstenotic atherosclerotic plaque. The left V4 segment is occluded. The basilar artery is patent with mild stenosis proximally. There is a patent right posterior communicating artery. PCAs are patent with distal branch vessel attenuation bilaterally as well as mild left P2 stenoses. No aneurysm is identified. Venous sinuses: As permitted by contrast timing, patent. Anatomic variants: None. Delayed phase: Not performed. Review of the MIP images confirms the above findings CT Brain Perfusion Findings: CBF (<30%) Volume: 39mL Perfusion (Tmax>6.0s) volume: 20mL Mismatch Volume: 3mL Infarction Location:Left parietal and posterior temporal lobes. IMPRESSION: 1. Proximal left M2 inferior division occlusion. 2. Left temporoparietal core infarct with penumbra as above. 3. Severe attenuation of left MCA superior division branch vessels. 4. Moderate bilateral intracranial ICA stenoses. 5. 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 6. Widely patent right vertebral artery. 7. Mild carotid atherosclerosis in the neck without stenosis. 8. 4.2 cm ascending aortic aneurysm, stable to at most minimally enlarged  compared to 2014 though  incompletely imaged today. Aortic Atherosclerosis (ICD10-I70.0). Aortic aneurysm NOS (ICD10-I71.9). These results were communicated to Dr. Rory Percy at 5:45 pm on 12/15/2017 by text page via the [facility] messaging system. Electronically Signed   By: Logan Bores M.D.   On: 12/25/2017 18:05   Ct Cerebral Perfusion W Contrast  Result Date: 12/09/2017 CLINICAL DATA:  Right-sided weakness and slurred speech. EXAM: CT ANGIOGRAPHY HEAD AND NECK CT PERFUSION BRAIN TECHNIQUE: Multidetector CT imaging of the head and neck was performed using the standard protocol during bolus administration of intravenous contrast. Multiplanar CT image reconstructions and MIPs were obtained to evaluate the vascular anatomy. Carotid stenosis measurements (when applicable) are obtained utilizing NASCET criteria, using the distal internal carotid diameter as the denominator. Multiphase CT imaging of the brain was performed following IV bolus contrast injection. Subsequent parametric perfusion maps were calculated using RAPID software. CONTRAST:  174mL ISOVUE-370 IOPAMIDOL (ISOVUE-370) INJECTION 76% COMPARISON:  12/26/2012 chest CT. FINDINGS: CTA NECK FINDINGS Aortic arch: Normal variant aortic arch branching pattern with common origin of the brachiocephalic and left common carotid arteries. Mild arch atherosclerosis. Partially visualized aneurysmal dilatation of the ascending aorta to at least 4.2 cm (4.1 cm at this level on the prior chest CT). Right carotid system: Patent with mild scattered atherosclerotic plaque. No evidence of dissection or stenosis. Tortuous proximal common carotid artery. Retropharyngeal course of the proximal ICA. Left carotid system: Patent with mild scattered atherosclerotic plaque. No evidence of dissection or stenosis. Tortuous proximal common carotid artery. Retropharyngeal course of the proximal ICA and ECA. Vertebral arteries: Widely patent and strongly dominant right vertebral artery.  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 Skeleton: Mild-to-moderate cervical spondylosis. Other neck: Mildly enlarged thyroid gland with bilateral small nodules, similar to the prior chest CT. Upper chest: No apical lung consolidation or mass. Review of the MIP images confirms the above findings CTA HEAD FINDINGS Anterior circulation: The internal carotid arteries are patent from skull base to carotid termini with atherosclerosis resulting in moderate bilateral cavernous and right supraclinoid stenoses. There is no significant M1 stenosis. There is proximal left M2 inferior division occlusion, and there is also asymmetric attenuation of left M2 superior division branch vessels. ACAs are patent with mild bilateral A1 and moderate A2 segment irregularity without flow limiting proximal stenosis. No aneurysm is identified. Posterior circulation: The intracranial right vertebral artery is patent with nonstenotic atherosclerotic plaque. The left V4 segment is occluded. The basilar artery is patent with mild stenosis proximally. There is a patent right posterior communicating artery. PCAs are patent with distal branch vessel attenuation bilaterally as well as mild left P2 stenoses. No aneurysm is identified. Venous sinuses: As permitted by contrast timing, patent. Anatomic variants: None. Delayed phase: Not performed. Review of the MIP images confirms the above findings CT Brain Perfusion Findings: CBF (<30%) Volume: 42mL Perfusion (Tmax>6.0s) volume: 98mL Mismatch Volume: 41mL Infarction Location:Left parietal and posterior temporal lobes. IMPRESSION: 1. Proximal left M2 inferior division occlusion. 2. Left temporoparietal core infarct with penumbra as above. 3. Severe attenuation of left MCA superior division branch vessels. 4. Moderate bilateral intracranial ICA stenoses. 5. Hypoplastic left vertebral  artery with occluded V4 segment. 6. Widely patent right vertebral artery. 7. Mild carotid atherosclerosis in the neck without stenosis. 8. 4.2 cm ascending aortic aneurysm, stable to at most minimally enlarged compared to 2014 though incompletely imaged today. Aortic Atherosclerosis (ICD10-I70.0). Aortic aneurysm NOS (ICD10-I71.9). These results were communicated to Dr.  Arora at 5:45 pm on 01/02/2018 by text page via the [facility] messaging system. Electronically Signed   By: Logan Bores M.D.   On: 12/26/2017 18:05   Portable Chest Xray  Result Date: 12/20/2017 CLINICAL DATA:  Endotracheal tube placement. EXAM: PORTABLE CHEST 1 VIEW COMPARISON:  Radiograph of December 19, 2017. FINDINGS: Stable cardiomegaly. Endotracheal tube is in grossly good position. Nasogastric tube is seen entering stomach with distal tip in expected position of proximal stomach. No pneumothorax or pleural effusion is noted. No acute pulmonary disease is noted. Bony thorax is unremarkable. IMPRESSION: Endotracheal and nasogastric tubes are in grossly good position. No acute cardiopulmonary abnormality seen. Electronically Signed   By: Marijo Conception, M.D.   On: 12/20/2017 08:50   Portable Chest X-ray  Result Date: 12/31/2017 CLINICAL DATA:  Endotracheal tube placement. Orogastric tube placement. EXAM: PORTABLE CHEST 1 VIEW COMPARISON:  Chest radiograph performed 08/27/2017 FINDINGS: The patient's endotracheal tube is seen ending 2-3 cm above the carina. An enteric tube is noted extending below the diaphragm. The lungs are well-aerated. Minimal left basilar atelectasis is noted. There is no evidence of pleural effusion or pneumothorax. The cardiomediastinal silhouette is mildly enlarged. No acute osseous abnormalities are seen. IMPRESSION: 1. Endotracheal tube seen ending 2-3 cm above the carina. 2. Enteric tube noted extending below the diaphragm. 3. Minimal left basilar atelectasis noted; lungs otherwise clear. Mild cardiomegaly.  Electronically Signed   By: Garald Balding M.D.   On: 01/01/2018 22:09   Dg Abd Portable 1v  Result Date: 12/20/2017 CLINICAL DATA:  Orogastric tube placement. EXAM: PORTABLE ABDOMEN - 1 VIEW COMPARISON:  None. FINDINGS: Distal tip of orogastric tube is seen in proximal stomach. No abnormal bowel dilatation is noted. IMPRESSION: Distal tip of orogastric tube seen in proximal stomach. Electronically Signed   By: Marijo Conception, M.D.   On: 12/20/2017 08:51   Ct Head Code Stroke Wo Contrast  Result Date: 01/06/2018 CLINICAL DATA:  Code stroke. Right-sided weakness and slurred speech. EXAM: CT HEAD WITHOUT CONTRAST TECHNIQUE: Contiguous axial images were obtained from the base of the skull through the vertex without intravenous contrast. COMPARISON:  08/26/2017 FINDINGS: Brain: There is no evidence of acute infarct, intracranial hemorrhage, mass, midline shift, or extra-axial fluid collection. A small chronic cortical infarct is again noted in the posterior right frontal lobe. Cerebral white matter hypodensities are similar to the prior study and nonspecific but compatible with mild chronic small vessel ischemic disease. Mild cerebral atrophy is within normal limits for age. Vascular: Calcified atherosclerosis at the skull base. No hyperdense vessel. Skull: No fracture or focal osseous lesion. Sinuses/Orbits: Mild bilateral ethmoid air cell mucosal thickening. Clear mastoid air cells. Unremarkable orbits. Other: None. ASPECTS [facility] Stroke Program Early CT Score) - Ganglionic level infarction (caudate, lentiform nuclei, internal capsule, insula, M1-M3 cortex): 7 - Supraganglionic infarction (M4-M6 cortex): 3 Total score (0-10 with 10 being normal): 10 IMPRESSION: 1. No acute evidence of acute intracranial abnormality. 2. ASPECTS is 10. 3. Mild chronic small vessel ischemic disease and small chronic right frontal infarct. These results were communicated to Dr. Rory Percy at 5:21 pm on 12/21/2017 by text page via  the [facility] messaging system. Electronically Signed   By: Logan Bores M.D.   On: 12/15/2017 17:22    Labs:  CBC: Recent Labs    08/26/17 2212 08/27/17 0226  08/27/17 1200 01/05/2018 1711 12/30/2017 1716 12/20/17 0448  WBC 5.6 7.4  --   --  7.8  --  7.9  HGB  11.5* 9.5*   < > 9.3* 9.7* 10.9* 8.6*  HCT 35.8* 29.2*   < > 28.2* 32.3* 32.0* 27.8*  PLT 202 213  --   --  250  --  222   < > = values in this interval not displayed.    COAGS: Recent Labs    05/11/17 1100  05/14/17 0210 05/15/17 0539 05/16/17 0217 08/27/17 0226 12/16/2017 1711  INR 1.29  --   --  1.34  --  1.41 1.08  APTT  --    < > 36 37* 39*  --  32   < > = values in this interval not displayed.    BMP: Recent Labs    08/27/17 0226  09/01/17 1139 12/29/2017 1711 12/21/2017 1716 12/20/17 0448  NA 137   < > 139 133* 135 135  K 3.2*   < > 4.1 3.7 3.7 3.4*  CL 103   < > 102 104 104 107  CO2 21*  --  22 15*  --  20*  GLUCOSE 158*   < > 113* 82 82 108*  BUN 17   < > 14 29* 29* 21  CALCIUM 8.4*  --  9.6 9.1  --  8.4*  CREATININE 1.63*   < > 1.40* 2.20* 2.30* 1.64*  GFRNONAA 30*  --  37* 21*  --  29*  GFRAA 34*  --  42* 24*  --  34*   < > = values in this interval not displayed.    LIVER FUNCTION TESTS: Recent Labs    02/20/17 1239 12/26/2017 1711  BILITOT 0.6 0.7  AST 31 18  ALT 19 10  ALKPHOS 92 74  PROT 7.7 7.5  ALBUMIN 3.7 3.8    Assessment and Plan:  Left MCA distal M2 segment occlusion s/p emergent mechanical thrombectomy achieving a TICI 3 revascularization 12/21/2017 by Dr. Estanislado Pandy. Patient's condition stable- remains intubated and sedated, withdraws from pain in all extremities. Right groin incision stable with sheath in place- plan to remove 12/21/2017. Plan for MRI brain today. Appreciate and agree with neurology management. Please call IR with questions/concerns.   Electronically Signed: Earley Abide, PA-C 12/20/2017, 10:21 AM   I spent a total of 15 Minutes at the the patient's  bedside AND on the patient's hospital floor or unit, greater than 50% of which was counseling/coordinating care for left MCA distal M2 segment occlusion s/p revascularization.

## 2017-12-20 NOTE — Progress Notes (Addendum)
NAME:  Carrie Key, MRN:  [id number], DOB:  1940-06-23, LOS: 1 ADMISSION DATE:  12/15/2017, CONSULTATION DATE: December 19, 2017 REFERRING MD: December 19, 2017, CHIEF COMPLAINT: Acute CVA  Brief History   77 year old female on Eliquis for atrial fibrillation presented with right-sided weakness and was taken to IR for mechanical thymectomy.  Postoperatively she remained on the ventilator and was transferred to the ICU.  Past Medical History  AF on eliquis, breast Ca s/p mastectomy and oral chemo (discharged from oncology 2019), CKD, HTN, HLD.   Significant Hospital Events   11/12: admit, intubated, to IR for thrombectomy.   Consults:  PCCM 11/12  Procedures:  11/12: Left M1/M2 segment mechanical thrombectomy.  11/12 ETT >> 11/12 Foley >> 11/12: R fem sheath >> 11/12: L Radial art line >>  Significant Diagnostic Tests:  CT head 11/12 > No acute evidence of acute intracranial abnormality. Mild chronic small vessel ischemic disease and small chronic right frontal infarct. CT angio/perfusion head/neck 11/12 > Proximal left M2 inferior division occlusion. Left temporoparietal core infarct with penumbra as above. Severe attenuation of left MCA superior division branch vessels. Moderate bilateral intracranial ICA stenoses. 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 Widely patent right vertebral artery. Mild carotid atherosclerosis in the neck without stenosis. 4.2 cm ascending aortic aneurysm, stable to at most minimally enlarged compared to 2014 though incompletely imaged today.  MRI brain TTE  Micro Data:  11/12 MRSA PCR >> neg  Antimicrobials:  Cefazolin periop   Interim history/subjective:  No events overnight On wake up assessment this am, patient with left gaze, spont movement of L side but not to commands and withdrawals on right  Remains on propofol 30 mcg/kg/min and cleviprex at 8 mg/hr MRI planned for this am  Objective   Blood pressure  108/64, pulse 60, temperature 98.4 F (36.9 C), temperature source Oral, resp. rate 16, height 5\' 6"  (1.676 m), weight 105 kg, SpO2 100 %.    Vent Mode: PRVC FiO2 (%):  [40 %-60 %] 40 % Set Rate:  [16 bmp] 16 bmp Vt Set:  [470 mL] 470 mL PEEP:  [5 cmH20] 5 cmH20 Plateau Pressure:  [15 cmH20-16 cmH20] 15 cmH20   Intake/Output Summary (Last 24 hours) at 12/20/2017 0803 Last data filed at 12/20/2017 0700 Gross per 24 hour  Intake 2045.98 ml  Output 1295 ml  Net 750.98 ml   Filed Weights   01/05/2018 2230  Weight: 105 kg    Examination: General:  Obese female sedate on MV in NAD HEENT: MM pink/moist, ETT 7 at 23 cm, OGT, pupils 3/sluggish Neuro:  On sedation, withdrawals to noxious in all extremities, not f/c CV: sinus brady 56, unable to appreciate murmur, +2 distal pulses, right groin site soft/ wnl with sheath  PULM: even/non-labored on full MV support, lungs bilaterally coarse GI: soft, +bs, +foley- cyu Extremities: warm/dry, no LE edema  Skin: no rashes   Resolved Hospital Problem list    Assessment & Plan:   Acute CVA: L MCA. Not a candidate for systemic tpa due to Eliquis. Taken for thrombectomy 11/12 with successful revascularization  - Management per neurology - sheath removal per IR - (planned for removal 48hr after insertion- will be 11/14) - MRI brain planned for today (prn versed/ fentanyl for MRI only as patient only able to have one running drug infusion) - TTE ordered - cleviprex for goal SBP goal < 140 - CBG monitoring and SSI to keep glucose 140-180. - Frequent neuro checks.  -  PT/OT once extubated - MRI this am   Inability to protect airway in the perioperative setting - Full vent support - Propofol for RASS goal 0/-1 - CXR 11/13 reviewed, stable ETT/OGT, mild LLL atelectasis - Attempt SBT after MRI this am, attempt to extubate but will have to be flat while sheath is still in  - NPO and SLP after extubation - PRN BD  Possible aspiration vs  atelectasis  - trend CXR, hold ABX for now - Follow WBC/fever curves.   Atrial fibrillation - Holding anticoagulation per stroke team, will need repeat CT in 2 weeks to determine restarting anticoagulation - Telemetry monitoring - rate control as needed  AKI on CKD   - continue gentle IVF hydration, NS at 50 ml - Follow BMP, trend UOP  Anemia Hgb 10.9-> 8.6, no evidence of bleeding - T&S - transfuse for Hgb < 7  Best practice:  Diet: NPO, will start TF if not extubated today Pain/Anxiety/Delirium protocol (if indicated): Propofol gtt, prn fentanyl w/ bowel regimen VAP protocol (if indicated): per protocol DVT prophylaxis: SCD (on eliquis PTA) GI prophylaxis: Pepcid half dose Glucose control: SSI Mobility: BR Code Status: Full Family Communication: No family at bedside.  Disposition: ICU  Labs   CBC: Recent Labs  Lab 01/06/2018 1711 12/16/2017 1716 12/20/17 0448  WBC 7.8  --  7.9  NEUTROABS 5.9  --  6.9  HGB 9.7* 10.9* 8.6*  HCT 32.3* 32.0* 27.8*  MCV 82.2  --  80.8  PLT 250  --  962    Basic Metabolic Panel: Recent Labs  Lab 12/31/2017 1711 12/28/2017 1716 12/20/17 0448  NA 133* 135 135  K 3.7 3.7 3.4*  CL 104 104 107  CO2 15*  --  20*  GLUCOSE 82 82 108*  BUN 29* 29* 21  CREATININE 2.20* 2.30* 1.64*  CALCIUM 9.1  --  8.4*   GFR: Estimated Creatinine Clearance: 35.8 mL/min (A) (by C-G formula based on SCr of 1.64 mg/dL (H)). Recent Labs  Lab 12/26/2017 1711 12/20/17 0448  WBC 7.8 7.9    Liver Function Tests: Recent Labs  Lab 12/20/2017 1711  AST 18  ALT 10  ALKPHOS 74  BILITOT 0.7  PROT 7.5  ALBUMIN 3.8   No results for input(s): LIPASE, AMYLASE in the last 168 hours. No results for input(s): AMMONIA in the last 168 hours.  ABG    Component Value Date/Time   PHART 7.373 12/31/2017 2149   PCO2ART 36.4 12/25/2017 2149   PO2ART 199 (H) 12/11/2017 2149   HCO3 20.7 12/09/2017 2149   TCO2 22 01/04/2018 1716   ACIDBASEDEF 3.7 (H) 12/14/2017 2149     O2SAT 99.1 12/13/2017 2149     Coagulation Profile: Recent Labs  Lab 12/23/2017 1711  INR 1.08    Cardiac Enzymes: No results for input(s): CKTOTAL, CKMB, CKMBINDEX, TROPONINI in the last 168 hours.  HbA1C: Hgb A1c MFr Bld  Date/Time Value Ref Range Status  12/20/2017 04:48 AM 5.8 (H) 4.8 - 5.6 % Final    Comment:    (NOTE) Pre diabetes:          5.7%-6.4% Diabetes:              >6.4% Glycemic control for   <7.0% adults with diabetes   04/26/2016 02:49 PM 5.8 (H) 4.8 - 5.6 % Final    Comment:    (NOTE)         Pre-diabetes: 5.7 - 6.4  Diabetes: >6.4         Glycemic control for adults with diabetes: <7.0     CBG: Recent Labs  Lab 01/05/2018 2143 12/10/2017 2329 12/20/17 0320 12/20/17 0733  GLUCAP 72 88 107* 104*   Critical care time: 35 min    Kennieth Rad, AGACNP-BC [facility] Pgr: [phone] or if no answer [phone] 12/20/2017, 8:03 AM

## 2017-12-20 NOTE — Significant Event (Signed)
PCCM Interval Note  Met with family, specifically patient's three children Hanley Hays, and Oto on request, along with patient's nurse, Margaretha Sheffield.  They previously spoke to Dr. Leonie Man earlier today in reference to patient's decline with evidence of developing cerebral edema, new left ICA stroke on MRI, and developing brain herniation. They are all in agreement to change code status to DNR, as this would add no benefit, and state that they would like to wait till in the morning, possibly around 10 am to withdrawal care to allow more family to arrive.  Family acknowledge that patient could potentially pass prior to this.  Both son will be near tonight in the event of sudden change and asked to be called if not at bedside.    Code status changed to DNR/ DNI.  No further escalation of care.    Kennieth Rad, AGACNP-BC [facility] Pgr: [phone] or if no answer [phone] 12/20/2017, 6:46 PM

## 2017-12-20 NOTE — Progress Notes (Signed)
L pupil dilated and fixed. Dr Leonie Man notified. Orders placed to start 3%. Family updated at bedside by Dr. Leonie Man.

## 2017-12-20 NOTE — Progress Notes (Signed)
BP high, 180s, gtt increased, fentanyl given, cont to monitor.

## 2017-12-20 NOTE — Progress Notes (Signed)
[facility] Progress Note Patient Name: Carrie Key DOB: 1940/06/14 MRN: [id number]   Date of Service  12/20/2017  HPI/Events of Note  Chronic afib on Eliquis, CKD presented with right sided weakness and slurred speech underwent mechanical thrombectoy M1/M2 occlusion.  eICU Interventions  Intubated for airway protection Maintain normotensive, euglycemic     Intervention Category Major Interventions: Hypertension - evaluation and management Evaluation Type: New Patient Evaluation  Judd Lien 12/20/2017, 2:57 AM

## 2017-12-20 NOTE — Progress Notes (Signed)
Orders from Dr. Leonel Ramsay to keep BP goal 120-140 as ordered for Cleviprex gtt. Will continue to monitor and titrate as needed.

## 2017-12-20 NOTE — Progress Notes (Signed)
Initial Nutrition Assessment  DOCUMENTATION CODES:   Obesity unspecified  INTERVENTION:   If remains intubated RN to start tube feeding:  Vital High Protein @ 15 ml/hr (360 ml/day) 60 ml Prostat TID  Provides: 960 kcal, 121 grams protein, and 300 ml free water.  TF regimen with cleviprex and propofol at current rate providing 2316 total kcal/day    NUTRITION DIAGNOSIS:   Inadequate oral intake related to inability to eat as evidenced by NPO status.  GOAL:   Provide needs based on ASPEN/SCCM guidelines  MONITOR:   TF tolerance, I & O's  REASON FOR ASSESSMENT:   Consult Enteral/tube feeding initiation and management  ASSESSMENT:   Pt with PMH of afib on Eliquis, breast ca s/p mastectomy/oral chemo (d/c from oncology 2019), CKD, HTN, HLD admitted 11/12 with L MCA CVA s/p IR for thrombectomy with successful revascularization   Pt discussed during ICU rounds and with RN.  Per RN pt will remain flat in reverse [place].  Will start TF today if not extubated.  No family present during visit.   Patient is currently intubated on ventilator support Temp (24hrs), Avg:97.3 F (36.3 C), Min:94.5 F (34.7 C), Max:98.4 F (36.9 C)  Propofol: 22.3 ml/hr provides: 588 kcal  Cleviprex: 16 ml/hr provides: 768 kcal  Medications reviewed and include: KCl 20 mEq x 1, SSI Labs reviewed: K+ 3.4 (L) BP: 121/50 MAP: 71     NUTRITION - FOCUSED PHYSICAL EXAM:    Most Recent Value  Orbital Region  No depletion  Upper Arm Region  No depletion  Thoracic and Lumbar Region  No depletion  Buccal Region  Unable to assess  Temple Region  No depletion  Clavicle Bone Region  No depletion  Clavicle and Acromion Bone Region  No depletion  Scapular Bone Region  Unable to assess  Dorsal Hand  No depletion  Patellar Region  No depletion  Anterior Thigh Region  No depletion  Posterior Calf Region  No depletion  Edema (RD Assessment)  None  Hair  Reviewed  Eyes  Unable to assess   Mouth  Unable to assess  Skin  Reviewed  Nails  Reviewed       Diet Order:   Diet Order            Diet NPO time specified  Diet effective now              EDUCATION NEEDS:   No education needs have been identified at this time  Skin:  Skin Assessment: Reviewed RN Assessment  Last BM:  unknown  Height:   Ht Readings from Last 1 Encounters:  12/13/2017 5\' 6"  (1.676 m)    Weight:   Wt Readings from Last 1 Encounters:  01/06/2018 105 kg    Ideal Body Weight:  59 kg  BMI:  Body mass index is 37.36 kg/m.  Estimated Nutritional Needs:   Kcal:  1155-1470  Protein:  >118 grams  Fluid:  > 1.5 L/day  Maylon Peppers RD, LDN, CNSC [phone] Pager [phone] After Hours Pager

## 2017-12-20 NOTE — Progress Notes (Signed)
Stroke Team progress Note Addendum  I was notified by the radiologist about MRI results showing massive left hemispheric infarct in the left internal carotid artery territory with cytotoxic edema and likely left carotid occlusion. I discussed the case with Dr. Estanislado Pandy who felt patient did not have any Noted intimal injury during the procedure and this likely represents a second embolus as she was off eliquis now greater than 24 hours. On exam the patient had fixed dilated left pupil and 4 mm sluggishly reactive right pupil. She was minimally responsive to sternal rub with partial withdrawal in the left upper and lower extremity with no response in the right. She had developed bradycardia with heart rate in the mid 40s to low 50s. She is likely developing significant cytotoxic edema and brain herniation. Patient was given mannitol 25 g 1 dose and started on hypertonic saline 75 mL an hour. I met with patient's son and daughter-in-law at the bedside and informed them about her poor prognosis and limited treatment options to control massive cerebral edema which was impending. I also spoke over the phone with the patient's daughter and son who en route to the hospital. Return later and spoke to seconds and who arrived also about the poor prognosis. Family understood the poor prognosis and wanted to wait for patient's daughter to arrive before making final decision about withdrawal of care. This patient is critically ill and at significant risk of neurological worsening, death and care requires constant monitoring of vital signs, hemodynamics,respiratory and cardiac monitoring, extensive review of multiple databases, frequent neurological assessment, discussion with family, other specialists and medical decision making of high complexity.I have made any additions or clarifications directly to the above note.This critical care time does not reflect procedure time, or teaching time or supervisory time of PA/NP/Med  Resident etc but could involve care discussion time.  I spent 30 minutes of neurocritical care time  in the care of  this patient. Antony Contras, MD Medical Director [facility] Pager: [phone] 12/20/2017 5:35 PM

## 2017-12-20 NOTE — Progress Notes (Signed)
OT Cancellation Note  Patient Details Name: Carrie Key MRN: [id number] DOB: August 26, 1940   Cancelled Treatment:    Reason Eval/Treat Not Completed: Patient not medically ready Per RN pt not appropriate at this time as pt remains intubated and still has sheath  Richelle Ito, OTR/L  Acute Rehabilitation Services Pager: [phone] Office: [phone] .  12/20/2017, 11:03 AM

## 2017-12-20 NOTE — Progress Notes (Signed)
PT Cancellation Note  Patient Details Name: Tymeshia Awan MRN: [id number] DOB: 17-Dec-1940   Cancelled Treatment:    Reason Eval/Treat Not Completed: Patient not medically ready. Per RN pt not appropriate at this time as pt remains intubated and still has sheath. Acute PT to return as able, as appropriate.  Kittie Plater, PT, DPT Acute Rehabilitation Services Pager #: [phone] Office #: [phone]    Berline Lopes 12/20/2017, 10:52 AM

## 2017-12-20 NOTE — Progress Notes (Signed)
BP better (150s) after sedation, cleviprex has been doubled, all sedation has been given, pt continues to move left arm and leg, coughing on vent. For her safety MRI terminated early. RN RT and MRI tech agree on plan.

## 2017-12-20 NOTE — Progress Notes (Signed)
Pt moving provider notified, ordersrecieved, fentanyl given.

## 2017-12-20 NOTE — Patient Outreach (Signed)
[facility]) Care Management  12/20/2017  Carrie Key 29-Jan-1941 [id number]   RN Health Coach Hospitalization  Referral Date:06/21/2017 Referral Source:Telephonic Screening Reason for Referral:Disease Management Education Insurance:[employer]   Outreach Attempt:  Notified patient hospitalized at [facility] for acute embolic stroke.  Patient currently in ICU intubated.  RN Health Coach will notify [facility] Liaison of patient's admission and for discharging needs follow up.  Plan:  RN Health Coach will await Hospital Liaison's recommendations.  [place] [phone] [email or web address]

## 2017-12-20 NOTE — Progress Notes (Signed)
  Echocardiogram 2D Echocardiogram has been performed.  Madelaine Etienne 12/20/2017, 10:23 AM

## 2017-12-20 NOTE — Progress Notes (Addendum)
STROKE TEAM PROGRESS NOTE   INTERVAL HISTORY Her RN is at the bedside.  Patient's neurological exam is poor with left gaze preference and dense right hemiplegia. She is scheduled for an MRI scan. She still has right groin sheath as she was and eliquis prior to admission. Her son and daughter-in-law at the bedside   Vitals:   12/20/17 0700 12/20/17 0740 12/20/17 0745 12/20/17 0800  BP: 108/64     Pulse: 60     Resp: 16     Temp:    97.7 F (36.5 C)  TempSrc:    Axillary  SpO2: 99% 99% 100%   Weight:      Height:        CBC:  Recent Labs  Lab 01/02/2018 1711 12/20/2017 1716 12/20/17 0448  WBC 7.8  --  7.9  NEUTROABS 5.9  --  6.9  HGB 9.7* 10.9* 8.6*  HCT 32.3* 32.0* 27.8*  MCV 82.2  --  80.8  PLT 250  --  681    Basic Metabolic Panel:  Recent Labs  Lab 12/15/2017 1711 01/01/2018 1716 12/20/17 0448  NA 133* 135 135  K 3.7 3.7 3.4*  CL 104 104 107  CO2 15*  --  20*  GLUCOSE 82 82 108*  BUN 29* 29* 21  CREATININE 2.20* 2.30* 1.64*  CALCIUM 9.1  --  8.4*   Lipid Panel:     Component Value Date/Time   CHOL 163 12/20/2017 0448   TRIG 200 (H) 12/20/2017 0448   HDL 60 12/20/2017 0448   CHOLHDL 2.7 12/20/2017 0448   VLDL 40 12/20/2017 0448   LDLCALC 63 12/20/2017 0448   HgbA1c:  Lab Results  Component Value Date   HGBA1C 5.8 (H) 12/20/2017    IMAGING Ct Angio Head W Or Wo Contrast  Result Date: 12/28/2017 CLINICAL DATA:  Right-sided weakness and slurred speech. EXAM: CT ANGIOGRAPHY HEAD AND NECK CT PERFUSION BRAIN TECHNIQUE: Multidetector CT imaging of the head and neck was performed using the standard protocol during bolus administration of intravenous contrast. Multiplanar CT image reconstructions and MIPs were obtained to evaluate the vascular anatomy. Carotid stenosis measurements (when applicable) are obtained utilizing NASCET criteria, using the distal internal carotid diameter as the denominator. Multiphase CT imaging of the brain was performed following IV  bolus contrast injection. Subsequent parametric perfusion maps were calculated using RAPID software. CONTRAST:  135mL ISOVUE-370 IOPAMIDOL (ISOVUE-370) INJECTION 76% COMPARISON:  12/26/2012 chest CT. FINDINGS: CTA NECK FINDINGS Aortic arch: Normal variant aortic arch branching pattern with common origin of the brachiocephalic and left common carotid arteries. Mild arch atherosclerosis. Partially visualized aneurysmal dilatation of the ascending aorta to at least 4.2 cm (4.1 cm at this level on the prior chest CT). Right carotid system: Patent with mild scattered atherosclerotic plaque. No evidence of dissection or stenosis. Tortuous proximal common carotid artery. Retropharyngeal course of the proximal ICA. Left carotid system: Patent with mild scattered atherosclerotic plaque. No evidence of dissection or stenosis. Tortuous proximal common carotid artery. Retropharyngeal course of the proximal ICA and ECA. Vertebral arteries: Widely patent and strongly dominant right vertebral artery. 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 Skeleton: Mild-to-moderate cervical spondylosis. Other neck: Mildly enlarged thyroid gland with bilateral small nodules, similar to the prior chest CT. Upper chest: No apical lung consolidation or mass. Review of the MIP images confirms the above findings CTA HEAD FINDINGS Anterior circulation: The internal carotid arteries are  patent from skull base to carotid termini with atherosclerosis resulting in moderate bilateral cavernous and right supraclinoid stenoses. There is no significant M1 stenosis. There is proximal left M2 inferior division occlusion, and there is also asymmetric attenuation of left M2 superior division branch vessels. ACAs are patent with mild bilateral A1 and moderate A2 segment irregularity without flow limiting proximal stenosis. No aneurysm is identified.  Posterior circulation: The intracranial right vertebral artery is patent with nonstenotic atherosclerotic plaque. The left V4 segment is occluded. The basilar artery is patent with mild stenosis proximally. There is a patent right posterior communicating artery. PCAs are patent with distal branch vessel attenuation bilaterally as well as mild left P2 stenoses. No aneurysm is identified. Venous sinuses: As permitted by contrast timing, patent. Anatomic variants: None. Delayed phase: Not performed. Review of the MIP images confirms the above findings CT Brain Perfusion Findings: CBF (<30%) Volume: 14mL Perfusion (Tmax>6.0s) volume: 43mL Mismatch Volume: 9mL Infarction Location:Left parietal and posterior temporal lobes. IMPRESSION: 1. Proximal left M2 inferior division occlusion. 2. Left temporoparietal core infarct with penumbra as above. 3. Severe attenuation of left MCA superior division branch vessels. 4. Moderate bilateral intracranial ICA stenoses. 5. 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 6. Widely patent right vertebral artery. 7. Mild carotid atherosclerosis in the neck without stenosis. 8. 4.2 cm ascending aortic aneurysm, stable to at most minimally enlarged compared to 2014 though incompletely imaged today. Aortic Atherosclerosis (ICD10-I70.0). Aortic aneurysm NOS (ICD10-I71.9). These results were communicated to Dr. Rory Percy at 5:45 pm on 12/21/2017 by text page via the [facility] messaging system. Electronically Signed   By: Logan Bores M.D.   On: 12/11/2017 18:05   Ct Angio Neck W Or Wo Contrast  Result Date: 01/03/2018 CLINICAL DATA:  Right-sided weakness and slurred speech. EXAM: CT ANGIOGRAPHY HEAD AND NECK CT PERFUSION BRAIN TECHNIQUE: Multidetector CT imaging of the head and neck was performed using the standard protocol during bolus administration of intravenous contrast. Multiplanar CT image reconstructions and MIPs were obtained to evaluate the vascular anatomy. Carotid  stenosis measurements (when applicable) are obtained utilizing NASCET criteria, using the distal internal carotid diameter as the denominator. Multiphase CT imaging of the brain was performed following IV bolus contrast injection. Subsequent parametric perfusion maps were calculated using RAPID software. CONTRAST:  171mL ISOVUE-370 IOPAMIDOL (ISOVUE-370) INJECTION 76% COMPARISON:  12/26/2012 chest CT. FINDINGS: CTA NECK FINDINGS Aortic arch: Normal variant aortic arch branching pattern with common origin of the brachiocephalic and left common carotid arteries. Mild arch atherosclerosis. Partially visualized aneurysmal dilatation of the ascending aorta to at least 4.2 cm (4.1 cm at this level on the prior chest CT). Right carotid system: Patent with mild scattered atherosclerotic plaque. No evidence of dissection or stenosis. Tortuous proximal common carotid artery. Retropharyngeal course of the proximal ICA. Left carotid system: Patent with mild scattered atherosclerotic plaque. No evidence of dissection or stenosis. Tortuous proximal common carotid artery. Retropharyngeal course of the proximal ICA and ECA. Vertebral arteries: Widely patent and strongly dominant right vertebral artery. 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 Skeleton: Mild-to-moderate cervical spondylosis. Other neck: Mildly enlarged thyroid gland with bilateral small nodules, similar to the prior chest CT. Upper chest: No apical lung consolidation or mass. Review of the MIP images confirms the above findings CTA HEAD FINDINGS Anterior circulation: The internal carotid arteries are patent from skull base to carotid termini with atherosclerosis resulting in moderate bilateral cavernous  and right supraclinoid stenoses. There is no significant M1 stenosis. There is proximal left M2 inferior division occlusion, and there is also  asymmetric attenuation of left M2 superior division branch vessels. ACAs are patent with mild bilateral A1 and moderate A2 segment irregularity without flow limiting proximal stenosis. No aneurysm is identified. Posterior circulation: The intracranial right vertebral artery is patent with nonstenotic atherosclerotic plaque. The left V4 segment is occluded. The basilar artery is patent with mild stenosis proximally. There is a patent right posterior communicating artery. PCAs are patent with distal branch vessel attenuation bilaterally as well as mild left P2 stenoses. No aneurysm is identified. Venous sinuses: As permitted by contrast timing, patent. Anatomic variants: None. Delayed phase: Not performed. Review of the MIP images confirms the above findings CT Brain Perfusion Findings: CBF (<30%) Volume: 26mL Perfusion (Tmax>6.0s) volume: 21mL Mismatch Volume: 51mL Infarction Location:Left parietal and posterior temporal lobes. IMPRESSION: 1. Proximal left M2 inferior division occlusion. 2. Left temporoparietal core infarct with penumbra as above. 3. Severe attenuation of left MCA superior division branch vessels. 4. Moderate bilateral intracranial ICA stenoses. 5. 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 6. Widely patent right vertebral artery. 7. Mild carotid atherosclerosis in the neck without stenosis. 8. 4.2 cm ascending aortic aneurysm, stable to at most minimally enlarged compared to 2014 though incompletely imaged today. Aortic Atherosclerosis (ICD10-I70.0). Aortic aneurysm NOS (ICD10-I71.9). These results were communicated to Dr. Rory Percy at 5:45 pm on 01/02/2018 by text page via the [facility] messaging system. Electronically Signed   By: Logan Bores M.D.   On: 01/04/2018 18:05   Ct Cerebral Perfusion W Contrast  Result Date: 12/21/2017 CLINICAL DATA:  Right-sided weakness and slurred speech. EXAM: CT ANGIOGRAPHY HEAD AND NECK CT PERFUSION BRAIN TECHNIQUE: Multidetector CT imaging of the  head and neck was performed using the standard protocol during bolus administration of intravenous contrast. Multiplanar CT image reconstructions and MIPs were obtained to evaluate the vascular anatomy. Carotid stenosis measurements (when applicable) are obtained utilizing NASCET criteria, using the distal internal carotid diameter as the denominator. Multiphase CT imaging of the brain was performed following IV bolus contrast injection. Subsequent parametric perfusion maps were calculated using RAPID software. CONTRAST:  118mL ISOVUE-370 IOPAMIDOL (ISOVUE-370) INJECTION 76% COMPARISON:  12/26/2012 chest CT. FINDINGS: CTA NECK FINDINGS Aortic arch: Normal variant aortic arch branching pattern with common origin of the brachiocephalic and left common carotid arteries. Mild arch atherosclerosis. Partially visualized aneurysmal dilatation of the ascending aorta to at least 4.2 cm (4.1 cm at this level on the prior chest CT). Right carotid system: Patent with mild scattered atherosclerotic plaque. No evidence of dissection or stenosis. Tortuous proximal common carotid artery. Retropharyngeal course of the proximal ICA. Left carotid system: Patent with mild scattered atherosclerotic plaque. No evidence of dissection or stenosis. Tortuous proximal common carotid artery. Retropharyngeal course of the proximal ICA and ECA. Vertebral arteries: Widely patent and strongly dominant right vertebral artery. 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 Skeleton: Mild-to-moderate cervical spondylosis. Other neck: Mildly enlarged thyroid gland with bilateral small nodules, similar to the prior chest CT. Upper chest: No apical lung consolidation or mass. Review of the MIP images confirms the above findings CTA HEAD FINDINGS Anterior circulation: The internal carotid arteries are patent from skull base to carotid termini  with atherosclerosis resulting in moderate bilateral cavernous and right supraclinoid stenoses. There is no significant M1 stenosis. There is proximal left M2  inferior division occlusion, and there is also asymmetric attenuation of left M2 superior division branch vessels. ACAs are patent with mild bilateral A1 and moderate A2 segment irregularity without flow limiting proximal stenosis. No aneurysm is identified. Posterior circulation: The intracranial right vertebral artery is patent with nonstenotic atherosclerotic plaque. The left V4 segment is occluded. The basilar artery is patent with mild stenosis proximally. There is a patent right posterior communicating artery. PCAs are patent with distal branch vessel attenuation bilaterally as well as mild left P2 stenoses. No aneurysm is identified. Venous sinuses: As permitted by contrast timing, patent. Anatomic variants: None. Delayed phase: Not performed. Review of the MIP images confirms the above findings CT Brain Perfusion Findings: CBF (<30%) Volume: 91mL Perfusion (Tmax>6.0s) volume: 10mL Mismatch Volume: 37mL Infarction Location:Left parietal and posterior temporal lobes. IMPRESSION: 1. Proximal left M2 inferior division occlusion. 2. Left temporoparietal core infarct with penumbra as above. 3. Severe attenuation of left MCA superior division branch vessels. 4. Moderate bilateral intracranial ICA stenoses. 5. 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 6. Widely patent right vertebral artery. 7. Mild carotid atherosclerosis in the neck without stenosis. 8. 4.2 cm ascending aortic aneurysm, stable to at most minimally enlarged compared to 2014 though incompletely imaged today. Aortic Atherosclerosis (ICD10-I70.0). Aortic aneurysm NOS (ICD10-I71.9). These results were communicated to Dr. Rory Percy at 5:45 pm on 01/03/2018 by text page via the [facility] messaging system. Electronically Signed   By: Logan Bores M.D.   On: 12/20/2017 18:05   Portable  Chest Xray  Result Date: 12/20/2017 CLINICAL DATA:  Endotracheal tube placement. EXAM: PORTABLE CHEST 1 VIEW COMPARISON:  Radiograph of December 19, 2017. FINDINGS: Stable cardiomegaly. Endotracheal tube is in grossly good position. Nasogastric tube is seen entering stomach with distal tip in expected position of proximal stomach. No pneumothorax or pleural effusion is noted. No acute pulmonary disease is noted. Bony thorax is unremarkable. IMPRESSION: Endotracheal and nasogastric tubes are in grossly good position. No acute cardiopulmonary abnormality seen. Electronically Signed   By: Marijo Conception, M.D.   On: 12/20/2017 08:50   Portable Chest X-ray  Result Date: 12/26/2017 CLINICAL DATA:  Endotracheal tube placement. Orogastric tube placement. EXAM: PORTABLE CHEST 1 VIEW COMPARISON:  Chest radiograph performed 08/27/2017 FINDINGS: The patient's endotracheal tube is seen ending 2-3 cm above the carina. An enteric tube is noted extending below the diaphragm. The lungs are well-aerated. Minimal left basilar atelectasis is noted. There is no evidence of pleural effusion or pneumothorax. The cardiomediastinal silhouette is mildly enlarged. No acute osseous abnormalities are seen. IMPRESSION: 1. Endotracheal tube seen ending 2-3 cm above the carina. 2. Enteric tube noted extending below the diaphragm. 3. Minimal left basilar atelectasis noted; lungs otherwise clear. Mild cardiomegaly. Electronically Signed   By: Garald Balding M.D.   On: 12/16/2017 22:09   Dg Abd Portable 1v  Result Date: 12/20/2017 CLINICAL DATA:  Orogastric tube placement. EXAM: PORTABLE ABDOMEN - 1 VIEW COMPARISON:  None. FINDINGS: Distal tip of orogastric tube is seen in proximal stomach. No abnormal bowel dilatation is noted. IMPRESSION: Distal tip of orogastric tube seen in proximal stomach. Electronically Signed   By: Marijo Conception, M.D.   On: 12/20/2017 08:51   Ct Head Code Stroke Wo Contrast  Result Date:  12/11/2017 CLINICAL DATA:  Code stroke. Right-sided weakness and slurred speech. EXAM: CT HEAD WITHOUT CONTRAST TECHNIQUE: Contiguous axial images were obtained from the base of the skull through the vertex without intravenous contrast. COMPARISON:  08/26/2017 FINDINGS: Brain:  There is no evidence of acute infarct, intracranial hemorrhage, mass, midline shift, or extra-axial fluid collection. A small chronic cortical infarct is again noted in the posterior right frontal lobe. Cerebral white matter hypodensities are similar to the prior study and nonspecific but compatible with mild chronic small vessel ischemic disease. Mild cerebral atrophy is within normal limits for age. Vascular: Calcified atherosclerosis at the skull base. No hyperdense vessel. Skull: No fracture or focal osseous lesion. Sinuses/Orbits: Mild bilateral ethmoid air cell mucosal thickening. Clear mastoid air cells. Unremarkable orbits. Other: None. ASPECTS [facility] Stroke Program Early CT Score) - Ganglionic level infarction (caudate, lentiform nuclei, internal capsule, insula, M1-M3 cortex): 7 - Supraganglionic infarction (M4-M6 cortex): 3 Total score (0-10 with 10 being normal): 10 IMPRESSION: 1. No acute evidence of acute intracranial abnormality. 2. ASPECTS is 10. 3. Mild chronic small vessel ischemic disease and small chronic right frontal infarct. These results were communicated to Dr. Rory Percy at 5:21 pm on 12/18/2017 by text page via the [facility] messaging system. Electronically Signed   By: Logan Bores M.D.   On: 12/16/2017 17:22   Cerebral Angiogram S/P Lt common carotid arteriogram followed by complete revascularization of occluded distal M2 seg of inf division with x 1 pass with trevoprovue 91mm x 12mm retriever device achieving a TICI 3 revascularization.  2D Echocardiogram  - Left ventricle: The cavity size was normal. Wall thickness was increased in a pattern of mild LVH. Systolic function was normal. The estimated ejection  fraction was in the range of 60% to 65%. Wall motion was normal; there were no regional wall motion abnormalities. Doppler parameters are consistent with pseudonormal left ventricular relaxation (grade 2 diastolic dysfunction). The E/e&' ratio is >15, suggesting elevated LV filling pressure. - Aortic valve: Trileaflet. Sclerosis without stenosis. - Aorta: Ascending aortic diameter: 39 mm (S). - Mitral valve: Mildly thickened leaflets . There was trivial regurgitation. - Left atrium: Moderately dilated. The atrium was normal in size. - Inferior vena cava: The vessel was normal in size. The respirophasic diameter changes were in the normal range (>= 50%), consistent with normal central venous pressure. Impressions:   Compared to a prior study in 05/2017, the LVEF has improved to 60-65%.   PHYSICAL EXAM Obese elderly African American lady who is intubated and sedated. . Afebrile. Head is nontraumatic. Neck is supple without bruit.    Cardiac exam no murmur or gallop. Lungs are clear to auscultation. Distal pulses are well felt.She has a right groin arterial sheath Neurological Exam :  She is intubated and sedated. Eyes are closed.Right pupil is 3 mm reactive and left is 4 mm sluggish. Left gaze deviation. Dolls eye movements are sluggish. Fundi could not be visualized. Right lower facial weakness. Tongue midline. She has dense right hemiplegia with no response to painful stimuli. She has semipurposeful withdrawal in the left upper and lower extremity to painful stimuli. Deep tendon reflexes are depressed on the right and present on the left. Right plantar upgoing left downgoing.  ASSESSMENT/PLAN Ms. Erina Castella Lerner is a 77 y.o. female with history of atrial fibrillation on Eliquis, breast cancer status post resection at least 5 years ago, CKD, hyperlipidemia, hypertension presenting with R sided weakness, slurred speech.   Stroke:  L MCA and L ACA entire territory infarcts and R periventricular  and R frontal infarcts. Initial infarct L MCA s/p mechanical thrombectomy with complete revascularization. New L ICA occlusion in hospital with progressive stroke size with cytotoxic cerebral edema. Infarcts embolic secondary to known atrial fibrillation  Sheath remains inplace. Will remove 48h after last eliquis dose  Code Stroke CT head No acute stroke. Small vessel disease.  Old right frontal infarct. ASPECTS 10.     CTA head & neck proximal L M2 inferior division occlusion.  Severe attenuation L MCA branch vessels.  Moderate B ICA stenosis.  Hypoplastic LVA with occluded V4.  Mild neck atherosclerosis.  4.2 cm ascending aortic aneurysm stable.  CT perfusion L temporoparietal infarct with penumbra.  Post IR CT pending  MRI interval distal left ICA occlusion with acute infarct entire left MCA and left ACA territories.  Small acute right periventricular and right frontal infarcts.  Old right frontal infarct.  Small vessel disease.  2D Echo  EF 60-65%. No source of embolus   LDL 63  HgbA1c 5.8  SCDs for VTE prophylaxis  Eliquis (apixaban) daily prior to admission, now on No antithrombotic. Will add aspirin po/pr.  Therapy recommendations:  pending   Disposition:  pending   Acute respiratory failure due to stroke  Intubated for IR  Critical care on board  on propofol  Cytotoxic cerebral edema, induced hypernatremia  Started on 3% saline  Goal sodium 150-155  Atrial Fibrillation  Home anticoagulation:  Eliquis (apixaban) daily   Coagulation on hold due to large size of stroke   Hypertension  Blood pressure per neuro interventional list x24 hours post IR   currently on Cleviprex  . Home meds: Norvasc 10 mg, Coreg 25 mg twice daily, Lasix 40 a day Isordil 10 mg a day . Long-term BP goal normotensive  Hyperlipidemia  Home meds: Crestor 10  LDL 63, goal < 70  Will Resume statin   Continue statin at discharge  Other Stroke Risk Factors  Advanced  age  Former Cigarette smoker, quit 29 yrs ago  ETOH use  UDS / ETOH level not performed   Obesity, Body mass index is 37.36 kg/m.  Family hx stroke (mother)  Other Active Problems  AKI on CKI stage III creatinine 2.2->1.64  History breast cancer   GERD  Acute blood loss anemia status post IR 10.9->9.7->8.6  Hypokalemia 3.4  Possible aspiration versus atelectasis.  Hospital day # 1  Burnetta Sabin, MSN, APRN, ANVP-BC, AGPCNP-BC Advanced Practice Stroke Nurse [facility] for Schedule & Pager information 12/20/2017 3:09 PM  I have personally examined this patient, reviewed notes, independently viewed imaging studies, participated in medical decision making and plan of care.ROS completed by me personally and pertinent positives fully documented  I have made any additions or clarifications directly to the above note. Agree with note above. She presented with sudden onset of right hemiplegia and speech difficulties due to left MCA occlusion and underwent successful mechanical thrombectomy. Continue strict blood pressure control and close neurological monitoring as per postlumpectomy protocol. She will need intubation and sheath removal only tomorrow as she was on eliquis. Long discussion with the bedside with patient's son and daughter-in-law about her prognosis, plan for evaluation, treatment and answered questions. This patient is critically ill and at significant risk of neurological worsening, death and care requires constant monitoring of vital signs, hemodynamics,respiratory and cardiac monitoring, extensive review of multiple databases, frequent neurological assessment, discussion with family, other specialists and medical decision making of high complexity.I have made any additions or clarifications directly to the above note.This critical care time does not reflect procedure time, or teaching time or supervisory time of PA/NP/Med Resident etc but could  involve care discussion time.  I spent 50 minutes of  neurocritical care time  in the care of  this patient.      Antony Contras, MD Medical Director [facility] Pager: [phone] 12/20/2017 5:30 PM  To contact Stroke Continuity provider, please refer to [email or web address] After hours, contact General Neurology

## 2017-12-20 NOTE — Progress Notes (Signed)
SLP Cancellation Note  Patient Details Name: Carrie Key MRN: [id number] DOB: 1940/04/13   Cancelled treatment:       Reason Eval/Treat Not Completed: Medical issues which prohibited therapy. Remains intubated   Braiden Presutti, Katherene Ponto 12/20/2017, 9:56 AM

## 2017-12-20 NOTE — Progress Notes (Signed)
To MRI under continuous monitoring by RN and RT. 1 MG versed given when disconnected from propofol for MRI.

## 2017-12-21 LAB — GLUCOSE, CAPILLARY
GLUCOSE-CAPILLARY: 121 mg/dL — AB (ref 70–99)
Glucose-Capillary: 114 mg/dL — ABNORMAL HIGH (ref 70–99)
Glucose-Capillary: 116 mg/dL — ABNORMAL HIGH (ref 70–99)

## 2017-12-21 LAB — RENAL FUNCTION PANEL
Albumin: 2.9 g/dL — ABNORMAL LOW (ref 3.5–5.0)
Anion gap: 8 (ref 5–15)
BUN: 12 mg/dL (ref 8–23)
CALCIUM: 8.4 mg/dL — AB (ref 8.9–10.3)
CO2: 19 mmol/L — AB (ref 22–32)
CREATININE: 1.24 mg/dL — AB (ref 0.44–1.00)
Chloride: 119 mmol/L — ABNORMAL HIGH (ref 98–111)
GFR calc non Af Amer: 41 mL/min — ABNORMAL LOW (ref >60)
GFR, EST AFRICAN AMERICAN: 48 mL/min — AB (ref >60)
Glucose, Bld: 139 mg/dL — ABNORMAL HIGH (ref 70–99)
PHOSPHORUS: 3.1 mg/dL (ref 2.5–4.6)
Potassium: 3.7 mmol/L (ref 3.5–5.1)
SODIUM: 146 mmol/L — AB (ref 135–145)

## 2017-12-21 LAB — CBC
HCT: 29 % — ABNORMAL LOW (ref 36.0–46.0)
Hemoglobin: 8.5 g/dL — ABNORMAL LOW (ref 12.0–15.0)
MCH: 24.6 pg — ABNORMAL LOW (ref 26.0–34.0)
MCHC: 29.3 g/dL — ABNORMAL LOW (ref 30.0–36.0)
MCV: 83.8 fL (ref 80.0–100.0)
Platelets: 233 10*3/uL (ref 150–400)
RBC: 3.46 MIL/uL — ABNORMAL LOW (ref 3.87–5.11)
RDW: 21.3 % — ABNORMAL HIGH (ref 11.5–15.5)
WBC: 8.1 10*3/uL (ref 4.0–10.5)
nRBC: 0 % (ref 0.0–0.2)

## 2017-12-21 LAB — SODIUM
SODIUM: 145 mmol/L (ref 135–145)
SODIUM: 149 mmol/L — AB (ref 135–145)

## 2017-12-21 LAB — MAGNESIUM: Magnesium: 1.6 mg/dL — ABNORMAL LOW (ref 1.7–2.4)

## 2017-12-21 MED ORDER — GLYCOPYRROLATE 1 MG PO TABS
1.0000 mg | ORAL_TABLET | ORAL | Status: DC | PRN
  Filled 2017-12-21: qty 1

## 2017-12-21 MED ORDER — MORPHINE BOLUS VIA INFUSION
5.0000 mg | INTRAVENOUS | Status: DC | PRN
  Filled 2017-12-21: qty 5

## 2017-12-21 MED ORDER — GLYCOPYRROLATE 0.2 MG/ML IJ SOLN
0.2000 mg | INTRAMUSCULAR | Status: DC | PRN
  Administered 2017-12-21 – 2017-12-22 (×3): 0.2 mg via INTRAVENOUS
  Filled 2017-12-21 (×3): qty 1

## 2017-12-21 MED ORDER — HYDRALAZINE HCL 20 MG/ML IJ SOLN: 10.0000 mg | INTRAMUSCULAR | Status: DC | PRN

## 2017-12-21 MED ORDER — GLYCOPYRROLATE 0.2 MG/ML IJ SOLN: 0.2000 mg | INTRAMUSCULAR | Status: DC | PRN

## 2017-12-21 MED ORDER — MORPHINE SULFATE (PF) 2 MG/ML IV SOLN: 2.0000 mg | INTRAVENOUS | Status: DC | PRN

## 2017-12-21 MED ORDER — LORAZEPAM 2 MG/ML IJ SOLN: 2.0000 mg | INTRAMUSCULAR | Status: DC | PRN

## 2017-12-21 MED ORDER — MORPHINE 100MG IN NS 100ML (1MG/ML) PREMIX INFUSION
0.0000 mg/h | INTRAVENOUS | Status: DC
  Administered 2017-12-21 – 2017-12-22 (×2): 5 mg/h via INTRAVENOUS
  Filled 2017-12-21 (×3): qty 100

## 2017-12-21 MED ORDER — ALBUTEROL SULFATE (2.5 MG/3ML) 0.083% IN NEBU
2.5000 mg | INHALATION_SOLUTION | Freq: Three times a day (TID) | RESPIRATORY_TRACT | Status: DC
  Administered 2017-12-21: 2.5 mg via RESPIRATORY_TRACT
  Filled 2017-12-21: qty 3

## 2017-12-21 MED ORDER — AMLODIPINE BESYLATE 10 MG PO TABS: 10.0000 mg | ORAL_TABLET | Freq: Every day | ORAL | Status: DC

## 2017-12-21 NOTE — Progress Notes (Signed)
Visited with family per spiritual care consult.  Supported family with prayer, listening and presence. Made family aware of [place]. Several family members are from out of town long distance. Patient is supported by large family and friends presence.  Chaplain will follow as needed.  Jaclynn Major, [place], [facility], Pager [phone]

## 2017-12-21 NOTE — Consult Note (Signed)
   [facility] CM Inpatient Consult   12/21/2017  Carrie Key Feb 18, 1940 [id number]   Alert by [place] of patient's admission.  Patient has been active with [facility] for chronic disease management services.  Patient has been engaged by a [facility] [facility] Coach.   Chart review reveals patient is intubated and admitted for Stroke, MRI results showing massive left hemispheric infarct in the left internal carotid artery territory with cytotoxic edema and likely left carotid occlusion.  Patient remains in critical care ICU level of care.  Will follow for progress, disposition and post hospital needs for community care management, if appropriate. Of note, [facility] Care Management services does not replace or interfere with any services that are needed or arranged by inpatient case management or social work.  For additional questions or referrals please contact:   Natividad Brood, RN BSN [facility] Liaison  [phone] business mobile phone Toll free office [phone]

## 2017-12-21 NOTE — Progress Notes (Signed)
PT Cancellation Note  Patient Details Name: Carrie Key MRN: [id number] DOB: 19-Jun-1940   Cancelled Treatment:    Reason Eval/Treat Not Completed: PT screened, no needs identified, will sign off.  Pt has transitioned to comfort care. 12/21/2017  Donnella Sham, [place] [phone]  (pager) [phone]  (office)   Tessie Fass [name] 12/21/2017, 3:37 PM

## 2017-12-21 NOTE — Procedures (Signed)
Extubation Procedure Note  Patient Details:   Name: Carrie Key DOB: April 13, 1940 MRN: [id number]   Airway Documentation:    Vent end date: 12/21/17 Vent end time: 1033   Evaluation  O2 sats: stable throughout Complications: No apparent complications Patient did tolerate procedure well. Bilateral Breath Sounds: Diminished, Expiratory wheezes   No  Patient was extubated per comfort care order. Family and RN at bedside  Lanna Poche 12/21/2017, 10:39 AM

## 2017-12-21 NOTE — Progress Notes (Signed)
Received patient from 4N. Patient not responsive and transitioned to full comfort care. Per discussion with patient's 2 sons and daughter in law, they do not wish for the Morphine infusion to be titrated or any other PRN medications to be given besides Robinul. Educated the family on the importance of keeping her comfortable and they wish to leave the Morphine infusion at 21ml/hr.

## 2017-12-21 NOTE — Progress Notes (Signed)
NAME:  Carrie Key, MRN:  [id number], DOB:  [birth year], LOS: 2 ADMISSION DATE:  01/05/2018, CONSULTATION DATE: December 19, 2017 REFERRING MD: December 19, 2017, CHIEF COMPLAINT: Acute CVA  Brief History   77 year old female on Eliquis for atrial fibrillation presented with right-sided weakness and was taken to IR for mechanical thymectomy.  Postoperatively she remained on the ventilator and was transferred to the ICU.  Past Medical History  AF on eliquis, breast Ca s/p mastectomy and oral chemo (discharged from oncology 2019), CKD, HTN, HLD.   Significant Hospital Events   11/12: admit, intubated, to IR for thrombectomy 11/13  MRI worse; DNR  Consults:  PCCM 11/12  Procedures:  11/12: Left M1/M2 segment mechanical thrombectomy.  11/12 ETT >> 11/12 Foley >> 11/12: R fem sheath >> 11/12: L Radial art line >>  Significant Diagnostic Tests:  CT head 11/12 > No acute evidence of acute intracranial abnormality. Mild chronic small vessel ischemic disease and small chronic right frontal infarct. CT angio/perfusion head/neck 11/12 > Proximal left M2 inferior division occlusion. Left temporoparietal core infarct with penumbra as above. Severe attenuation of left MCA superior division branch vessels. Moderate bilateral intracranial ICA stenoses. 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 Widely patent right vertebral artery. Mild carotid atherosclerosis in the neck without stenosis. 4.2 cm ascending aortic aneurysm, stable to at most minimally enlarged compared to 2014 though incompletely imaged today. 11/13 MRI brain >> Evidence of interval distal left ICA occlusion with acute infarction of the entire left MCA and left ACA territories.  Small acute right periventricular and right frontal infarcts.  Small chronic right frontal infarct and mild chronic small vessel ischemic white matter disease. 11/13 TTE >> mild LVH, EF 60-65%, G2DD  Micro Data:  11/12 MRSA PCR >>  neg  Antimicrobials:  Cefazolin periop   Interim history/subjective:  No events overnight.  Remains on 3% at 75 ml/hr and cleviprex at 21 mg/hr Off sedation since yesterday afternoon.  Per RN withdrawals in all extremities w/spont movement in LUE  Objective   Blood pressure (!) 134/59, pulse 65, temperature 98.6 F (37 C), temperature source Axillary, resp. rate 16, height 5\' 6"  (1.676 m), weight 105.2 kg, SpO2 100 %.    Vent Mode: PSV;PRVC FiO2 (%):  [40 %] 40 % Set Rate:  [16 bmp] 16 bmp Vt Set:  [470 mL] 470 mL PEEP:  [5 cmH20] 5 cmH20 Plateau Pressure:  [16 cmH20-23 cmH20] 17 cmH20   Intake/Output Summary (Last 24 hours) at 12/21/2017 0853 Last data filed at 12/21/2017 0700 Gross per 24 hour  Intake 1809.46 ml  Output 1275 ml  Net 534.46 ml   Filed Weights   12/12/2017 2230 12/21/17 0441  Weight: 105 kg 105.2 kg    Examination: General:  Critically ill elderly female on MV in NAD HEENT: MM pink/dry, ETT 7 at 23ish, OGT, R pupil 3/non reactive/weak corneal, Left pupil 5/ non-reactive/ +corneal  Neuro: Withdrawals to noxious stimuli in all extremities, spont movement of LUE CV: RR, SR 65, no murmur, +2 pulses PULM: even/non-labored on MV, lungs bilaterally clear GI: soft, +BS  Extremities: warm/dry, +1 LE edema Skin: no rashes   Resolved Hospital Problem list    Assessment & Plan:   Acute Left MCA s/p mechanical thrombectomy s/p complete revascularization. Not a candidate for systemic tpa due to Eliquis. New Left ICA occulusion with progressive stroke size and cytotoxic cerebral edema thought to be related to embolic with known Afib and off eliquis since admit -  Management per neurology, poor prognosis for meaningful recovery.  Made DNR 11/13. Family to transition to comfort care today at 10 am.  - D/c hypertonic saline - will start morphine gtt for comfort with prn ativan and robinul  - hold on sheath removal   Inability to protect airway in the perioperative  setting - Full vent support, PRVC  - not requiring sedation  - not a candidate for weaning/extubation given mental status  Possible aspiration vs atelectasis  - Follow WBC/fever curves.   Atrial fibrillation- currently in SR - Holding anticoagulation per stroke team, will need repeat CT in 2 weeks to determine restarting anticoagulation - Telemetry monitoring  AKI on CKD   - Follow BMP, trend UOP  Anemia Hgb 10.9-> 8.6- >8.5, stable, no evidence of bleeding - monitor/ trend CBC  Best practice:  Diet: NPO Pain/Anxiety/Delirium protocol (if indicated): not needed VAP protocol (if indicated): per protocol DVT prophylaxis: SCD (on eliquis PTA) GI prophylaxis: Pepcid half dose Glucose control: SSI Mobility: BR Code Status: DNR, no further escalation of care Family Communication: No family at bedside currently at bedside 11/14.  I spoke patient's sons and daughter yesterday afternoon and they were planning on transitioning to comfort care today when all family arrived at 42 am.  Disposition: ICU  Labs   CBC: Recent Labs  Lab 12/09/2017 1711 12/18/2017 1716 12/20/17 0448 12/21/17 0441  WBC 7.8  --  7.9 8.1  NEUTROABS 5.9  --  6.9  --   HGB 9.7* 10.9* 8.6* 8.5*  HCT 32.3* 32.0* 27.8* 29.0*  MCV 82.2  --  80.8 83.8  PLT 250  --  222 093    Basic Metabolic Panel: Recent Labs  Lab 12/18/2017 1711 01/05/2018 1716 12/20/17 0448 12/20/17 1412 12/20/17 2000 12/21/17 0212 12/21/17 0441  NA 133* 135 135 137 143 145 146*  K 3.7 3.7 3.4*  --   --   --  3.7  CL 104 104 107  --   --   --  119*  CO2 15*  --  20*  --   --   --  19*  GLUCOSE 82 82 108*  --   --   --  139*  BUN 29* 29* 21  --   --   --  12  CREATININE 2.20* 2.30* 1.64*  --   --   --  1.24*  CALCIUM 9.1  --  8.4*  --   --   --  8.4*  MG  --   --   --   --  1.6*  --  1.6*  PHOS  --   --   --   --  3.1  --  3.1   GFR: Estimated Creatinine Clearance: 47.3 mL/min (A) (by C-G formula based on SCr of 1.24 mg/dL  (H)). Recent Labs  Lab 12/15/2017 1711 12/20/17 0448 12/21/17 0441  WBC 7.8 7.9 8.1    Liver Function Tests: Recent Labs  Lab 12/24/2017 1711 12/21/17 0441  AST 18  --   ALT 10  --   ALKPHOS 74  --   BILITOT 0.7  --   PROT 7.5  --   ALBUMIN 3.8 2.9*   No results for input(s): LIPASE, AMYLASE in the last 168 hours. No results for input(s): AMMONIA in the last 168 hours.  ABG    Component Value Date/Time   PHART 7.373 12/30/2017 2149   PCO2ART 36.4 12/08/2017 2149   PO2ART 199 (H) 12/17/2017 2149   HCO3 20.7  12/15/2017 2149   TCO2 22 12/24/2017 1716   ACIDBASEDEF 3.7 (H) 12/26/2017 2149   O2SAT 99.1 12/14/2017 2149     Coagulation Profile: Recent Labs  Lab 01/03/2018 1711  INR 1.08    Cardiac Enzymes: No results for input(s): CKTOTAL, CKMB, CKMBINDEX, TROPONINI in the last 168 hours.  HbA1C: Hgb A1c MFr Bld  Date/Time Value Ref Range Status  12/20/2017 04:48 AM 5.8 (H) 4.8 - 5.6 % Final    Comment:    (NOTE) Pre diabetes:          5.7%-6.4% Diabetes:              >6.4% Glycemic control for   <7.0% adults with diabetes   04/26/2016 02:49 PM 5.8 (H) 4.8 - 5.6 % Final    Comment:    (NOTE)         Pre-diabetes: 5.7 - 6.4         Diabetes: >6.4         Glycemic control for adults with diabetes: <7.0     CBG: Recent Labs  Lab 12/20/17 1548 12/20/17 1939 12/21/17 0039 12/21/17 0354 12/21/17 0736  GLUCAP 101* 99 121* 114* 116*   Critical care time: 30 min    Kennieth Rad, AGACNP-BC [facility] Pgr: [phone] or if no answer [phone] 12/21/2017, 8:53 AM

## 2017-12-21 NOTE — Progress Notes (Signed)
45fr sheath pulled from right groin, using manual pressure at 1151 hrs.  Pressure held and hemostasis achieved at 1216 hrs.  No complications or issues, distal pulses intact.  Groin site reviewed with pt's RN Ellie.  [employer]

## 2017-12-21 NOTE — Progress Notes (Signed)
SLP Cancellation Note  Patient Details Name: Carrie Key MRN: [id number] DOB: Feb 07, 1941   Cancelled treatment:       Reason Eval/Treat Not Completed: Patient not medically ready. Will sign off given plan of care.    Harshal Sirmon, Katherene Ponto 12/21/2017, 7:54 AM

## 2017-12-21 NOTE — Anesthesia Postprocedure Evaluation (Signed)
Anesthesia Post Note  Patient: Carrie Key  Procedure(s) Performed: IR WITH ANESTHESIA (N/A )     Patient location during evaluation: SICU    Last Vitals:  Vitals:   12/21/17 0806 12/21/17 0807  BP:    Pulse:    Resp:    Temp:    SpO2: 100% 100%    Last Pain:  Vitals:   12/21/17 0330  TempSrc: Axillary                 [name]

## 2017-12-21 NOTE — Progress Notes (Signed)
STROKE TEAM PROGRESS NOTE   INTERVAL HISTORY Her son is at the bedside.  Patient's neurological exam  Has worsened with now a right pupil being small fixed and nonreactive in the left being large and nonreactive. She has minimum respiratory effort above ventilator setting. The patient's family members have arrived and evaluated making decision to withdraw ventilatory support soon   Vitals:   12/21/17 0830 12/21/17 0900 12/21/17 0930 12/21/17 1000  BP: (!) 137/59 (!) 140/58 (!) 136/58 (!) 168/74  Pulse: 65 65 66 66  Resp: 16 16 18 18   Temp:      TempSrc:      SpO2: 100% 100% 100% 100%  Weight:      Height:        CBC:  Recent Labs  Lab 12/29/2017 1711  12/20/17 0448 12/21/17 0441  WBC 7.8  --  7.9 8.1  NEUTROABS 5.9  --  6.9  --   HGB 9.7*   < > 8.6* 8.5*  HCT 32.3*   < > 27.8* 29.0*  MCV 82.2  --  80.8 83.8  PLT 250  --  222 233   < > = values in this interval not displayed.    Basic Metabolic Panel:  Recent Labs  Lab 12/20/17 0448  12/20/17 2000  12/21/17 0441 12/21/17 0755  NA 135   < > 143   < > 146* 149*  K 3.4*  --   --   --  3.7  --   CL 107  --   --   --  119*  --   CO2 20*  --   --   --  19*  --   GLUCOSE 108*  --   --   --  139*  --   BUN 21  --   --   --  12  --   CREATININE 1.64*  --   --   --  1.24*  --   CALCIUM 8.4*  --   --   --  8.4*  --   MG  --   --  1.6*  --  1.6*  --   PHOS  --   --  3.1  --  3.1  --    < > = values in this interval not displayed.   Lipid Panel:     Component Value Date/Time   CHOL 163 12/20/2017 0448   TRIG 200 (H) 12/20/2017 0448   HDL 60 12/20/2017 0448   CHOLHDL 2.7 12/20/2017 0448   VLDL 40 12/20/2017 0448   LDLCALC 63 12/20/2017 0448   HgbA1c:  Lab Results  Component Value Date   HGBA1C 5.8 (H) 12/20/2017    IMAGING Mr Brain Wo Contrast  Result Date: 12/20/2017 CLINICAL DATA:  Stroke follow-up. Right-sided weakness. Status post mechanical thrombectomy for dominant left M2 occlusion. EXAM: MRI HEAD WITHOUT  CONTRAST TECHNIQUE: Multiplanar, multiecho pulse sequences of the brain and surrounding structures were obtained without intravenous contrast. COMPARISON:  Head CT and CTA 12/15/2017 FINDINGS: The patient was moving throughout the examination which was terminated prematurely. Axial and coronal diffusion, axial FLAIR, and axial T2 sequences were obtained. Brain: There is restricted diffusion throughout the left cerebral hemisphere reflecting acute infarction of essentially the entirety of the ACA and MCA territories. The left PCA territory is spared. Small acute infarcts are noted adjacent to the right lateral ventricle. There is also a punctate acute cortical infarct in the right frontal lobe, and there is a small amount of  restricted diffusion in the midline of the posterior corpus callosum. There is cytotoxic edema throughout the left cerebral hemisphere without evidence of hemorrhagic transformation. There is no midline shift or extra-axial fluid collection. A small chronic infarct is again noted in the posterior right frontal lobe. Patchy bilateral cerebral white matter T2 hyperintensities are nonspecific but compatible with mild chronic small vessel ischemic disease. Mild cerebral atrophy is within normal limits for age. Vascular: Loss of the normal flow voids of the intracranial left ICA, left MCA, and left ACA. Skull and upper cervical spine: No destructive osseous lesion. Sinuses/Orbits: Unremarkable orbits. Mild mucosal thickening in the paranasal sinuses. No significant mastoid fluid. Other: Fluid in the nasopharynx in the setting of intubation. IMPRESSION: 1. Evidence of interval distal left ICA occlusion with acute infarction of the entire left MCA and left ACA territories. 2. Small acute right periventricular and right frontal infarcts. 3. Small chronic right frontal infarct and mild chronic small vessel ischemic white matter disease. These results were called by telephone at the time of interpretation  on 12/20/2017 at 12:34 pm to Dr. Antony Contras, who verbally acknowledged these results. Electronically Signed   By: Logan Bores M.D.   On: 12/20/2017 12:41   Portable Chest Xray  Result Date: 12/20/2017 CLINICAL DATA:  Endotracheal tube placement. EXAM: PORTABLE CHEST 1 VIEW COMPARISON:  Radiograph of December 19, 2017. FINDINGS: Stable cardiomegaly. Endotracheal tube is in grossly good position. Nasogastric tube is seen entering stomach with distal tip in expected position of proximal stomach. No pneumothorax or pleural effusion is noted. No acute pulmonary disease is noted. Bony thorax is unremarkable. IMPRESSION: Endotracheal and nasogastric tubes are in grossly good position. No acute cardiopulmonary abnormality seen. Electronically Signed   By: Marijo Conception, M.D.   On: 12/20/2017 08:50   Portable Chest X-ray  Result Date: 12/12/2017 CLINICAL DATA:  Endotracheal tube placement. Orogastric tube placement. EXAM: PORTABLE CHEST 1 VIEW COMPARISON:  Chest radiograph performed 08/27/2017 FINDINGS: The patient's endotracheal tube is seen ending 2-3 cm above the carina. An enteric tube is noted extending below the diaphragm. The lungs are well-aerated. Minimal left basilar atelectasis is noted. There is no evidence of pleural effusion or pneumothorax. The cardiomediastinal silhouette is mildly enlarged. No acute osseous abnormalities are seen. IMPRESSION: 1. Endotracheal tube seen ending 2-3 cm above the carina. 2. Enteric tube noted extending below the diaphragm. 3. Minimal left basilar atelectasis noted; lungs otherwise clear. Mild cardiomegaly. Electronically Signed   By: Garald Balding M.D.   On: 12/12/2017 22:09   Dg Abd Portable 1v  Result Date: 12/20/2017 CLINICAL DATA:  Orogastric tube placement. EXAM: PORTABLE ABDOMEN - 1 VIEW COMPARISON:  None. FINDINGS: Distal tip of orogastric tube is seen in proximal stomach. No abnormal bowel dilatation is noted. IMPRESSION: Distal tip of orogastric tube  seen in proximal stomach. Electronically Signed   By: Marijo Conception, M.D.   On: 12/20/2017 08:51   Cerebral Angiogram S/P Lt common carotid arteriogram followed by complete revascularization of occluded distal M2 seg of inf division with x 1 pass with trevoprovue 44mm x 46mm retriever device achieving a TICI 3 revascularization.  2D Echocardiogram  - Left ventricle: The cavity size was normal. Wall thickness was increased in a pattern of mild LVH. Systolic function was normal. The estimated ejection fraction was in the range of 60% to 65%. Wall motion was normal; there were no regional wall motion abnormalities. Doppler parameters are consistent with pseudonormal left ventricular relaxation (grade 2 diastolic dysfunction).  The E/e&' ratio is >15, suggesting elevated LV filling pressure. - Aortic valve: Trileaflet. Sclerosis without stenosis. - Aorta: Ascending aortic diameter: 39 mm (S). - Mitral valve: Mildly thickened leaflets . There was trivial regurgitation. - Left atrium: Moderately dilated. The atrium was normal in size. - Inferior vena cava: The vessel was normal in size. The respirophasic diameter changes were in the normal range (>= 50%), consistent with normal central venous pressure. Impressions:   Compared to a prior study in 05/2017, the LVEF has improved to 60-65%.   PHYSICAL EXAM Obese elderly African American lady who is intubated and sedated. . Afebrile. Head is nontraumatic. Neck is supple without bruit.    Cardiac exam no murmur or gallop. Lungs are clear to auscultation. Distal pulses are well felt. Neurological Exam :  She is intubated  and comatose Eyes are closed.Right pupil is 2 mm and not  reactive and left is 6 mm fixed Left gaze deviation. Dolls eye movements are sluggish. Fundi could not be visualized. Right lower facial weakness. Tongue midline. She has no withdrawal in both  upper and lower extremity to painful stimuli. Deep tendon reflexes are depressed on the right  and present on the left. Right plantar upgoing left downgoing.  ASSESSMENT/PLAN Ms. Carrie Key is a 77 y.o. female with history of atrial fibrillation on Eliquis, breast cancer status post resection at least 5 years ago, CKD, hyperlipidemia, hypertension presenting with R sided weakness, slurred speech.   Stroke:  L MCA and L ACA entire territory infarcts and R periventricular and R frontal infarcts. Initial infarct L MCA s/p mechanical thrombectomy with complete revascularization. New L ICA occlusion in hospital with progressive stroke size with cytotoxic cerebral edema. Infarcts embolic secondary to known atrial fibrillation    Code Stroke CT head No acute stroke. Small vessel disease.  Old right frontal infarct. ASPECTS 10.     CTA head & neck proximal L M2 inferior division occlusion.  Severe attenuation L MCA branch vessels.  Moderate B ICA stenosis.  Hypoplastic LVA with occluded V4.  Mild neck atherosclerosis.  4.2 cm ascending aortic aneurysm stable.  CT perfusion L temporoparietal infarct with penumbra.  Post IR CT pending  MRI interval distal left ICA occlusion with acute infarct entire left MCA and left ACA territories.  Small acute right periventricular and right frontal infarcts.  Old right frontal infarct.  Small vessel disease.  2D Echo  EF 60-65%. No source of embolus   LDL 63  HgbA1c 5.8  SCDs for VTE prophylaxis  Eliquis (apixaban) daily prior to admission, now on No antithrombotic. Will add aspirin po/pr.  Therapy recommendations:  pending   Disposition:  pending   Acute respiratory failure due to stroke  Intubated for IR  Critical care on board  on propofol  Cytotoxic cerebral edema, induced hypernatremia  Started on 3% saline  Goal sodium 150-155  Atrial Fibrillation  Home anticoagulation:  Eliquis (apixaban) daily   Coagulation on hold due to large size of stroke   Hypertension  Blood pressure per neuro interventional list x24  hours post IR   currently on Cleviprex  . Home meds: Norvasc 10 mg, Coreg 25 mg twice daily, Lasix 40 a day Isordil 10 mg a day . Long-term BP goal normotensive  Hyperlipidemia  Home meds: Crestor 10  LDL 63, goal < 70  Will Resume statin   Continue statin at discharge  Other Stroke Risk Factors  Advanced age  Former Cigarette smoker, quit 29 yrs ago  ETOH use  UDS / ETOH level not performed   Obesity, Body mass index is 37.43 kg/m.  Family hx stroke (mother)  Other Active Problems  AKI on CKI stage III creatinine 2.2->1.64  History breast cancer   GERD  Acute blood loss anemia status post IR 10.9->9.7->8.6  Hypokalemia 3.4  Possible aspiration versus atelectasis.  Hospital day # 2   . She presented with sudden onset of right hemiplegia and speech difficulties due to left MCA occlusion and underwent successful mechanical thrombectomy. Continue strict blood pressure control and close neurological monitoring as per postlumpectomy protocol. She will need intubation and sheath removal only tomorrow as she was on eliquis. Long discussion with the bedside with patient's son and daughter about her prognosis, plan for evaluation, treatment and answered questions.the family agrees to DO NOT RESUSCITATE and withdrawal of ventilatory support which will be done soon. Discussed plan with Dr. Lynetta Mare This patient is critically ill and at significant risk of neurological worsening, death and care requires constant monitoring of vital signs, hemodynamics,respiratory and cardiac monitoring, extensive review of multiple databases, frequent neurological assessment, discussion with family, other specialists and medical decision making of high complexity.I have made any additions or clarifications directly to the above note.This critical care time does not reflect procedure time, or teaching time or supervisory time of PA/NP/Med Resident etc but could involve care discussion time.  I  spent 35 minutes of neurocritical care time  in the care of  this patient.      Antony Contras, MD Medical Director [facility] Pager: [phone] 12/21/2017 6:09 PM  To contact Stroke Continuity provider, please refer to [email or web address] After hours, contact General Neurology

## 2017-12-22 ENCOUNTER — Encounter (HOSPITAL_COMMUNITY): Payer: Self-pay | Admitting: Interventional Radiology

## 2017-12-22 ENCOUNTER — Other Ambulatory Visit: Payer: Self-pay | Admitting: *Deleted

## 2017-12-26 ENCOUNTER — Ambulatory Visit: Payer: TRICARE For Life (TFL) | Admitting: Internal Medicine

## 2017-12-27 ENCOUNTER — Ambulatory Visit: Payer: Self-pay | Admitting: *Deleted

## 2018-01-07 NOTE — Death Summary Note (Signed)
Patient ID: Carrie Key MRN: [id number] DOB/AGE: 1940/11/05 77 y.o.  Admit date: January 10, 2018 Death date: 2018-01-13  Admission Diagnoses: Right-sided weakness, slurred speech  Cause of Death:  Respiratory failure secondary to large left hemispheric infarct due to left carotid occlusion with cytotoxic edema and brain herniation. Patient made DO NOT RESUSCITATE and comfort care by family.  Pertinent Medical Diagnosis: Active Problems:   Type 2 diabetes mellitus with renal manifestations (HCC)   QT prolongation   AF (paroxysmal atrial fibrillation) (HCC)   Acute ischemic stroke ([place])   Middle cerebral artery embolism, left   Acute renal failure superimposed on stage 3 chronic kidney disease ([place])   Hypertension associated with stage 3 chronic kidney disease due to type 2 diabetes mellitus (HCC)   Sinus bradycardia   Moderate mitral regurgitation   Aortic stenosis, mild   Cytotoxic brain edema (HCC)   Brain herniation (HCC)   Carotid occlusion, left   Hospital Course: [name] is a 77 y.o. female past medical history of atrial fibrillation on Eliquis, breast cancer status post resection at least 5 years ago, CKD, hyperlipidemia, hypertension, usual state of health with last known normal at 4 PM today when she was cooking and had a sudden onset of right-sided weakness and slurred speech according to the family.EMS was called, she had a leftward gaze, right-sided weakness and was a phasic.  A code stroke-LVO positive was called and patient was brought into the emergency room.She was evaluated on the emergency room bay, found to have symptoms consistent with a left MCA syndrome and taken in for a stat CT of the head.  Stat CT of the head showed no bleed.  Aspect score 10.CT Angio of head and neck showed a left M1/M2 junction occlusion. Favorable perfusion profile and CT perfusion. Dr Rory Percy  poke with the husband/boyfriend as well as son on the phone.  Son consented for  an emergent thrombectomy. She was taken emergent mechanical thrombectomy of the left middle cerebral artery with complete revascularization. She was transferred to the intensive care unit but her neurological exam did not improve. MRI scan of the brain showed large left middle and anterior cerebral artery entire territory infarcts as well as smaller right periventricular and right frontal infarcts. There was Left internal carotid artery occlusion with progressive increase in stroke size with cytotoxic edema and brain herniation. Patient was in atrial fibrillation and eliquis had been held for the embolectomy procedure and likely had a second embolus. The patient's family understood her poor prognosis and did not want to continue on prolonged ventilatory support and likely nursing home care-the patient would need. They made the patient DO NOT RESUSCITATE and comfort care and patient was extubated and kept comfortable on morphine and passed away shortly thereafter. Signed: Antony Contras 01/13/2018, 5:49 PM

## 2018-01-07 NOTE — Progress Notes (Signed)
Pt noted not breathing and pulseless, verified by another nurse. Informed on call Dr.M. Leonel Ramsay pt died at 26. Received order RN may pronounce death. Notified son [name] of pt death. Wasted approx. 62ml of Morphine drip to stericycle witnessed by [facility] C.RN.

## 2018-01-07 NOTE — Patient Outreach (Signed)
[facility]) Care Management  01-20-18  Carrie Key [birth year] [id number]   RN Health Case Closure  Referral Date:06/21/2017 Referral Source:Telephonic Screening Reason for Referral:Disease Management Education Insurance:[employer]   Outreach Attempt:  Received notification patient expired this morning.  Plan:  RN Health Coach will close case at this time.  RN Health Coach will notify primary care provider of case closure.  [name] [phone] [name]

## 2018-01-07 NOTE — Progress Notes (Signed)
Pt body transfer to morgue, still waiting for death certificate.

## 2018-01-07 DEATH — deceased

## 2018-04-05 ENCOUNTER — Ambulatory Visit: Payer: TRICARE For Life (TFL)

## 2018-04-05 ENCOUNTER — Ambulatory Visit: Payer: TRICARE For Life (TFL) | Admitting: Internal Medicine

## 2019-03-02 IMAGING — DX DG CHEST 2V
2 series · 2 of 2 positions shown · non-contrast
Comparison: 06/21/2014 .

CLINICAL DATA: Shortness of breath.

EXAM:
CHEST  2 VIEW

[x chest ap]
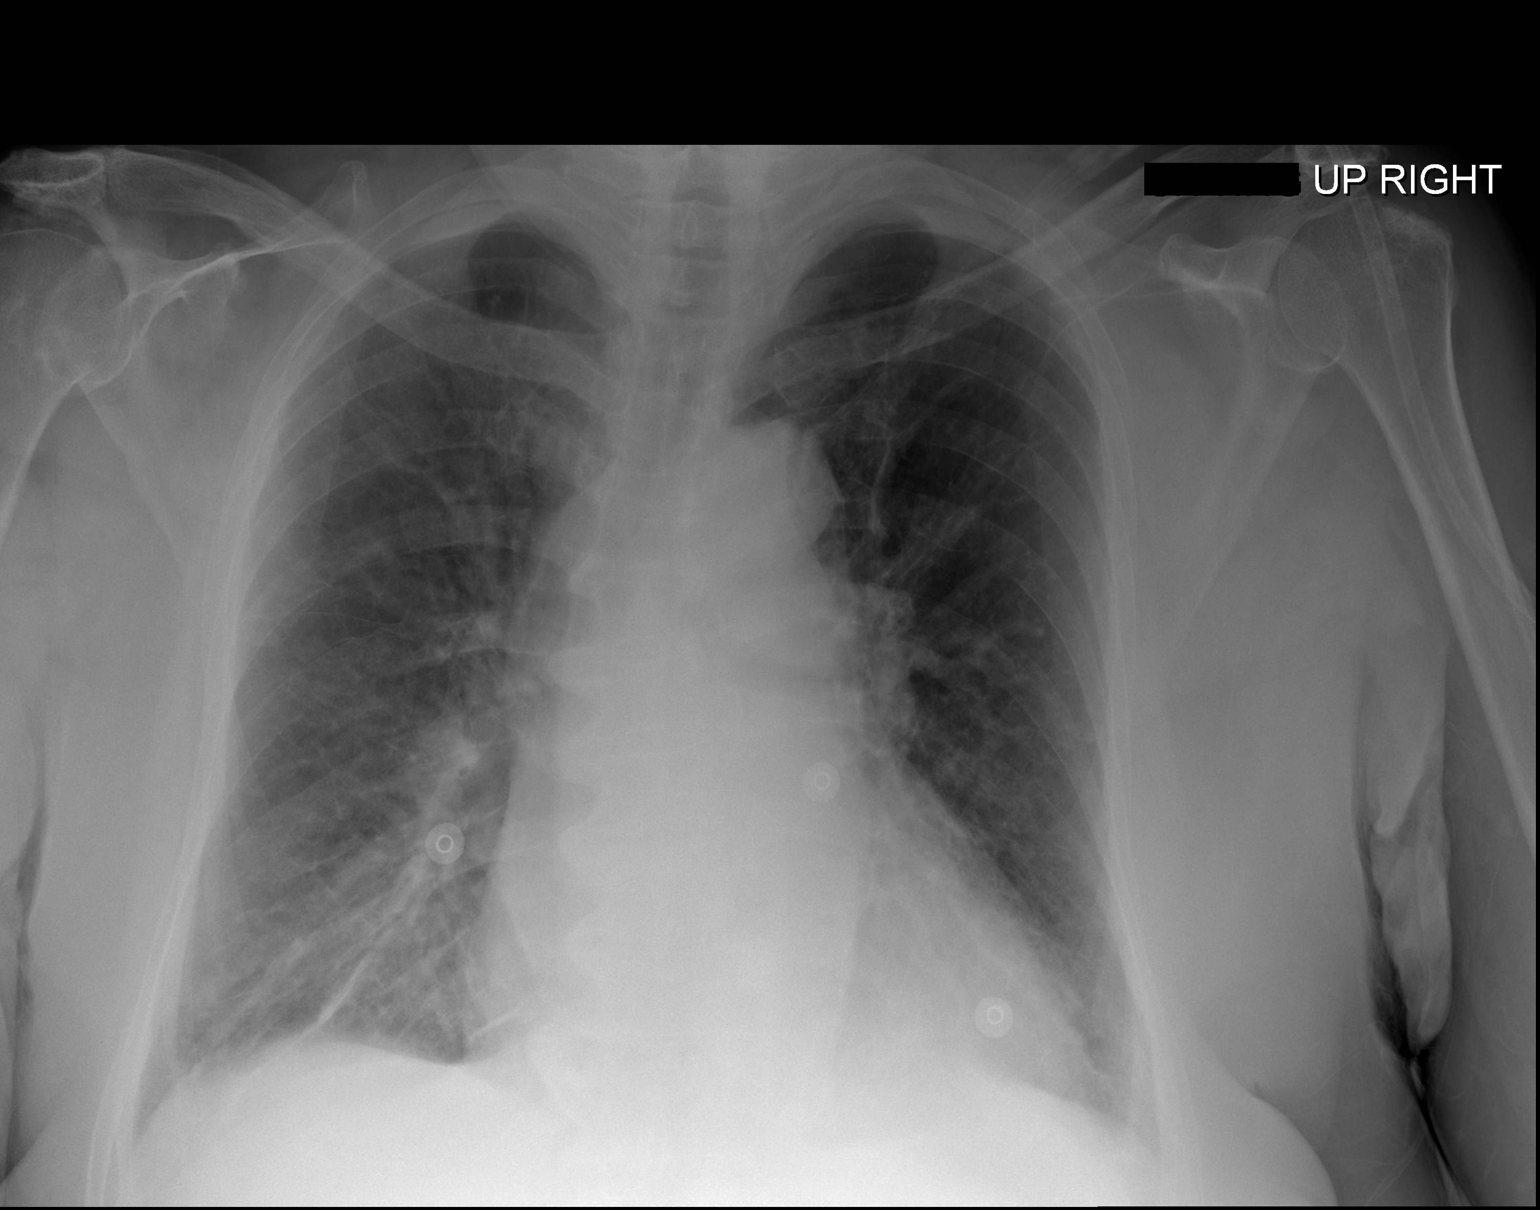

[w chest lat]
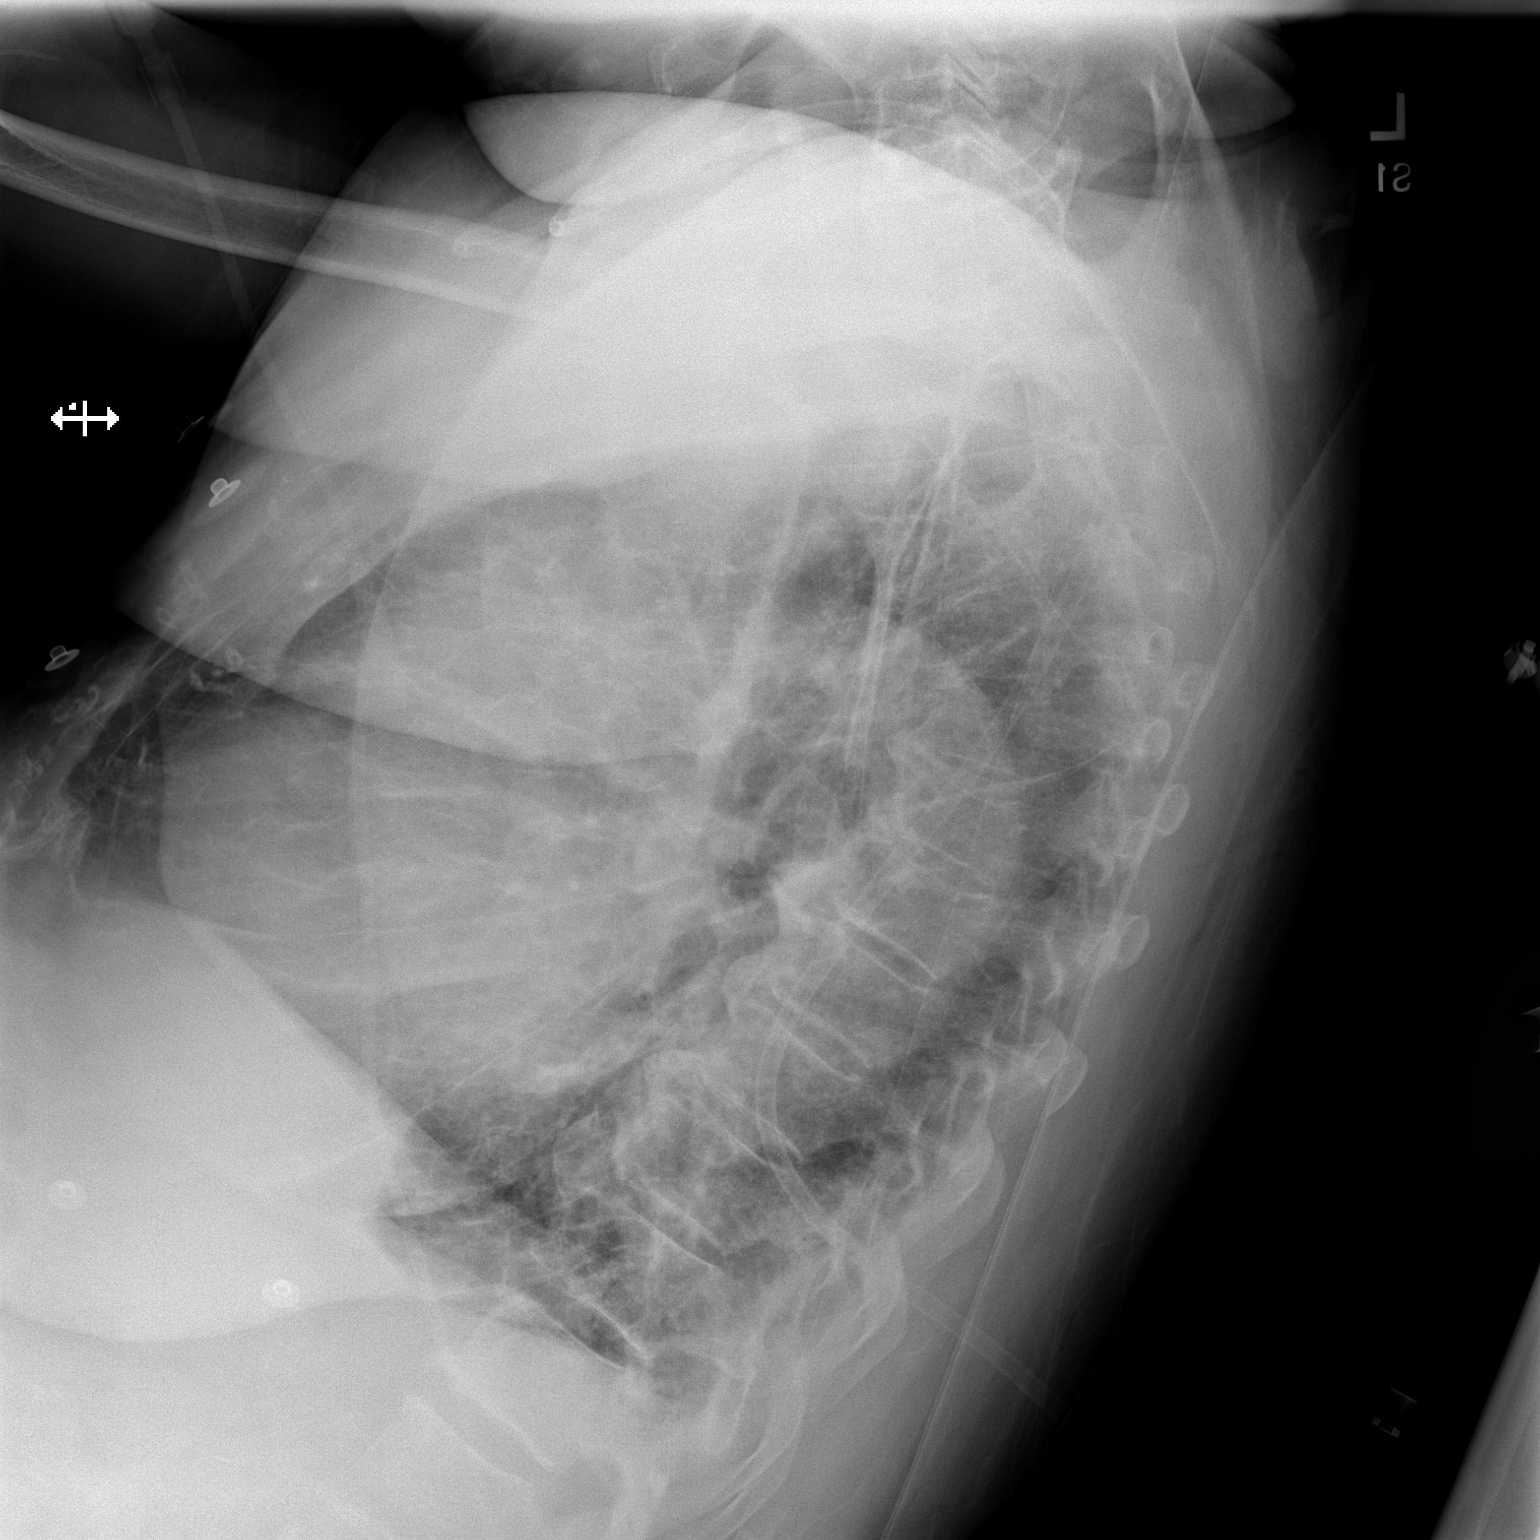

[2 of 2 positions shown; findings below may reference images not displayed]

FINDINGS: Mediastinum hilar structures normal. Cardiomegaly with mild
pulmonary vascular prominence and bilateral interstitial prominence
consistent mild CHF. No pleural effusion or pneumothorax.
IMPRESSION: Congestive heart failure with mild pulmonary interstitial edema.
